# Patient Record
Sex: Male | Born: 1950 | Race: Black or African American | Hispanic: No | Marital: Married | Smoking: Former smoker
Health system: Southern US, Community
[De-identification: ages and names within clinical notes are randomized; demographics above are authoritative.]

## PROBLEM LIST (undated history)

## (undated) DIAGNOSIS — E119 Type 2 diabetes mellitus without complications: Secondary | ICD-10-CM

## (undated) DIAGNOSIS — G4730 Sleep apnea, unspecified: Secondary | ICD-10-CM

## (undated) DIAGNOSIS — E785 Hyperlipidemia, unspecified: Secondary | ICD-10-CM

## (undated) DIAGNOSIS — N471 Phimosis: Secondary | ICD-10-CM

## (undated) DIAGNOSIS — I10 Essential (primary) hypertension: Secondary | ICD-10-CM

## (undated) DIAGNOSIS — H269 Unspecified cataract: Secondary | ICD-10-CM

## (undated) DIAGNOSIS — M109 Gout, unspecified: Secondary | ICD-10-CM

## (undated) DIAGNOSIS — Z860100 Personal history of colon polyps, unspecified: Secondary | ICD-10-CM

## (undated) DIAGNOSIS — K5730 Diverticulosis of large intestine without perforation or abscess without bleeding: Secondary | ICD-10-CM

## (undated) DIAGNOSIS — N183 Chronic kidney disease, stage 3 unspecified: Secondary | ICD-10-CM

## (undated) DIAGNOSIS — K219 Gastro-esophageal reflux disease without esophagitis: Secondary | ICD-10-CM

## (undated) DIAGNOSIS — M1990 Unspecified osteoarthritis, unspecified site: Secondary | ICD-10-CM

## (undated) DIAGNOSIS — I872 Venous insufficiency (chronic) (peripheral): Secondary | ICD-10-CM

## (undated) HISTORY — DX: Essential (primary) hypertension: I10

## (undated) HISTORY — DX: Gastro-esophageal reflux disease without esophagitis: K21.9

## (undated) HISTORY — DX: Hyperlipidemia, unspecified: E78.5

## (undated) HISTORY — DX: Type 2 diabetes mellitus without complications: E11.9

## (undated) HISTORY — DX: Phimosis: N47.1

## (undated) HISTORY — DX: Unspecified cataract: H26.9

## (undated) HISTORY — DX: Diverticulosis of large intestine without perforation or abscess without bleeding: K57.30

## (undated) HISTORY — DX: Gout, unspecified: M10.9

## (undated) HISTORY — DX: Venous insufficiency (chronic) (peripheral): I87.2

## (undated) HISTORY — PX: VASECTOMY: SHX75

## (undated) HISTORY — DX: Personal history of colon polyps, unspecified: Z86.0100

## (undated) HISTORY — PX: OTHER SURGICAL HISTORY: SHX169

## (undated) HISTORY — DX: Personal history of colonic polyps: Z86.010

---

## 2000-02-25 DIAGNOSIS — E119 Type 2 diabetes mellitus without complications: Secondary | ICD-10-CM

## 2000-02-25 HISTORY — DX: Type 2 diabetes mellitus without complications: E11.9

## 2001-02-24 DIAGNOSIS — N471 Phimosis: Secondary | ICD-10-CM

## 2001-02-24 HISTORY — DX: Phimosis: N47.1

## 2004-12-13 ENCOUNTER — Ambulatory Visit: Payer: Self-pay | Admitting: Internal Medicine

## 2005-02-07 ENCOUNTER — Ambulatory Visit: Payer: Self-pay | Admitting: Internal Medicine

## 2005-06-09 ENCOUNTER — Ambulatory Visit: Payer: Self-pay | Admitting: Internal Medicine

## 2005-06-27 ENCOUNTER — Ambulatory Visit: Payer: Self-pay | Admitting: Gastroenterology

## 2005-07-09 ENCOUNTER — Ambulatory Visit: Payer: Self-pay | Admitting: Internal Medicine

## 2005-07-11 ENCOUNTER — Ambulatory Visit: Payer: Self-pay | Admitting: Gastroenterology

## 2005-07-11 ENCOUNTER — Encounter (INDEPENDENT_AMBULATORY_CARE_PROVIDER_SITE_OTHER): Payer: Self-pay | Admitting: Specialist

## 2005-07-11 LAB — HM COLONOSCOPY: HM Colonoscopy: ABNORMAL

## 2005-07-14 ENCOUNTER — Ambulatory Visit: Payer: Self-pay | Admitting: Internal Medicine

## 2005-10-13 ENCOUNTER — Ambulatory Visit: Payer: Self-pay | Admitting: Internal Medicine

## 2005-11-13 ENCOUNTER — Ambulatory Visit: Payer: Self-pay | Admitting: Internal Medicine

## 2005-11-17 ENCOUNTER — Ambulatory Visit: Payer: Self-pay | Admitting: Internal Medicine

## 2005-11-24 HISTORY — PX: HYDROCELE EXCISION / REPAIR: SUR1145

## 2005-12-04 ENCOUNTER — Other Ambulatory Visit: Payer: Self-pay

## 2005-12-10 ENCOUNTER — Ambulatory Visit: Payer: Self-pay | Admitting: Urology

## 2005-12-18 ENCOUNTER — Ambulatory Visit: Payer: Self-pay | Admitting: Internal Medicine

## 2005-12-26 ENCOUNTER — Encounter: Payer: Self-pay | Admitting: Internal Medicine

## 2006-01-29 ENCOUNTER — Ambulatory Visit: Payer: Self-pay | Admitting: Internal Medicine

## 2006-03-16 ENCOUNTER — Ambulatory Visit: Payer: Self-pay | Admitting: Family Medicine

## 2006-03-20 ENCOUNTER — Ambulatory Visit: Payer: Self-pay | Admitting: Internal Medicine

## 2006-04-15 ENCOUNTER — Encounter: Payer: Self-pay | Admitting: Internal Medicine

## 2006-04-15 ENCOUNTER — Ambulatory Visit: Payer: Self-pay | Admitting: Internal Medicine

## 2006-04-25 ENCOUNTER — Ambulatory Visit: Payer: Self-pay | Admitting: Internal Medicine

## 2006-05-26 ENCOUNTER — Ambulatory Visit: Payer: Self-pay | Admitting: Internal Medicine

## 2006-06-09 DIAGNOSIS — Z860100 Personal history of colon polyps, unspecified: Secondary | ICD-10-CM | POA: Insufficient documentation

## 2006-06-09 DIAGNOSIS — K219 Gastro-esophageal reflux disease without esophagitis: Secondary | ICD-10-CM

## 2006-06-09 DIAGNOSIS — K5730 Diverticulosis of large intestine without perforation or abscess without bleeding: Secondary | ICD-10-CM | POA: Insufficient documentation

## 2006-06-09 DIAGNOSIS — E785 Hyperlipidemia, unspecified: Secondary | ICD-10-CM | POA: Insufficient documentation

## 2006-06-09 DIAGNOSIS — I10 Essential (primary) hypertension: Secondary | ICD-10-CM | POA: Insufficient documentation

## 2006-06-18 ENCOUNTER — Ambulatory Visit: Payer: Self-pay | Admitting: Internal Medicine

## 2006-06-19 LAB — CONVERTED CEMR LAB
ALT: 27 units/L (ref 0–40)
CO2: 32 meq/L (ref 19–32)
Chloride: 105 meq/L (ref 96–112)
Cholesterol: 132 mg/dL (ref 0–200)
Creatinine, Ser: 0.9 mg/dL (ref 0.4–1.5)
GFR calc Af Amer: 112 mL/min
Glucose, Bld: 211 mg/dL — ABNORMAL HIGH (ref 70–99)
HDL: 37.9 mg/dL — ABNORMAL LOW (ref >39.0)
PSA: 1 ng/mL (ref 0.10–4.00)
Sodium: 140 meq/L (ref 135–145)
Triglycerides: 130 mg/dL (ref 0–149)
VLDL: 26 mg/dL (ref 0–40)

## 2006-07-29 ENCOUNTER — Encounter (INDEPENDENT_AMBULATORY_CARE_PROVIDER_SITE_OTHER): Payer: Self-pay | Admitting: *Deleted

## 2006-07-31 ENCOUNTER — Ambulatory Visit: Payer: Self-pay | Admitting: Internal Medicine

## 2006-09-18 ENCOUNTER — Ambulatory Visit: Payer: Self-pay | Admitting: Internal Medicine

## 2006-09-21 LAB — CONVERTED CEMR LAB: Hgb A1c MFr Bld: 11.6 % — ABNORMAL HIGH (ref 4.6–6.1)

## 2006-11-13 ENCOUNTER — Ambulatory Visit: Payer: Self-pay | Admitting: Internal Medicine

## 2006-12-24 ENCOUNTER — Ambulatory Visit: Payer: Self-pay | Admitting: Internal Medicine

## 2006-12-25 LAB — CONVERTED CEMR LAB
ALT: 26 units/L (ref 0–53)
CO2: 31 meq/L (ref 19–32)
Calcium: 9.6 mg/dL (ref 8.4–10.5)
Chloride: 105 meq/L (ref 96–112)
Cholesterol: 129 mg/dL (ref 0–200)
Creatinine, Ser: 1 mg/dL (ref 0.4–1.5)
Glucose, Bld: 89 mg/dL (ref 70–99)
HDL: 32 mg/dL — ABNORMAL LOW (ref >39.0)
Sodium: 142 meq/L (ref 135–145)
Triglycerides: 135 mg/dL (ref 0–149)

## 2007-04-12 ENCOUNTER — Encounter (INDEPENDENT_AMBULATORY_CARE_PROVIDER_SITE_OTHER): Payer: Self-pay | Admitting: *Deleted

## 2007-04-16 ENCOUNTER — Ambulatory Visit: Payer: Self-pay | Admitting: Internal Medicine

## 2007-05-14 ENCOUNTER — Ambulatory Visit: Payer: Self-pay | Admitting: Family Medicine

## 2007-06-24 ENCOUNTER — Ambulatory Visit: Payer: Self-pay | Admitting: Internal Medicine

## 2007-06-24 ENCOUNTER — Encounter (INDEPENDENT_AMBULATORY_CARE_PROVIDER_SITE_OTHER): Payer: Self-pay | Admitting: *Deleted

## 2007-06-25 ENCOUNTER — Telehealth (INDEPENDENT_AMBULATORY_CARE_PROVIDER_SITE_OTHER): Payer: Self-pay | Admitting: *Deleted

## 2007-08-03 ENCOUNTER — Ambulatory Visit: Payer: Self-pay | Admitting: Family Medicine

## 2007-08-19 ENCOUNTER — Ambulatory Visit: Payer: Self-pay | Admitting: Internal Medicine

## 2007-11-26 ENCOUNTER — Ambulatory Visit: Payer: Self-pay | Admitting: Internal Medicine

## 2007-12-02 LAB — CONVERTED CEMR LAB
ALT: 23 units/L (ref 0–53)
AST: 17 units/L (ref 0–37)
CO2: 20 meq/L (ref 19–32)
Calcium: 9.3 mg/dL (ref 8.4–10.5)
Chloride: 99 meq/L (ref 96–112)
Cholesterol: 208 mg/dL — ABNORMAL HIGH (ref 0–200)
Hgb A1c MFr Bld: 13.1 % — ABNORMAL HIGH (ref 4.6–6.1)
Sodium: 136 meq/L (ref 135–145)
Total CHOL/HDL Ratio: 5.6
Total Protein: 7.1 g/dL (ref 6.0–8.3)
VLDL: 69 mg/dL — ABNORMAL HIGH (ref 0–40)

## 2007-12-09 ENCOUNTER — Telehealth: Payer: Self-pay | Admitting: Internal Medicine

## 2008-05-01 ENCOUNTER — Ambulatory Visit: Payer: Self-pay | Admitting: Internal Medicine

## 2008-05-05 LAB — CONVERTED CEMR LAB
Albumin: 3.8 g/dL (ref 3.5–5.2)
Basophils Absolute: 0 10*3/uL (ref 0.0–0.1)
Basophils Relative: 0.6 % (ref 0.0–3.0)
CO2: 31 meq/L (ref 19–32)
Calcium: 9.3 mg/dL (ref 8.4–10.5)
Chloride: 102 meq/L (ref 96–112)
Cholesterol: 157 mg/dL (ref 0–200)
Creatinine,U: 118.6 mg/dL
Eosinophils Absolute: 0.3 10*3/uL (ref 0.0–0.7)
GFR calc non Af Amer: 81 mL/min
Glucose, Bld: 256 mg/dL — ABNORMAL HIGH (ref 70–99)
Hemoglobin: 14 g/dL (ref 13.0–17.0)
Hgb A1c MFr Bld: 12.2 % — ABNORMAL HIGH (ref 4.6–6.0)
LDL Cholesterol: 95 mg/dL (ref 0–99)
Lymphocytes Relative: 58.3 % — ABNORMAL HIGH (ref 12.0–46.0)
MCHC: 33.7 g/dL (ref 30.0–36.0)
MCV: 88.9 fL (ref 78.0–100.0)
Microalb Creat Ratio: 48.1 mg/g — ABNORMAL HIGH (ref 0.0–30.0)
Microalb, Ur: 5.7 mg/dL — ABNORMAL HIGH (ref 0.0–1.9)
Monocytes Absolute: 0.5 10*3/uL (ref 0.1–1.0)
Neutro Abs: 1.9 10*3/uL (ref 1.4–7.7)
PSA: 1.11 ng/mL (ref 0.10–4.00)
Potassium: 4 meq/L (ref 3.5–5.1)
RBC: 4.66 M/uL (ref 4.22–5.81)
RDW: 12.6 % (ref 11.5–14.6)
Sodium: 139 meq/L (ref 135–145)
Total Bilirubin: 0.8 mg/dL (ref 0.3–1.2)
Triglycerides: 127 mg/dL (ref 0–149)
VLDL: 25 mg/dL (ref 0–40)

## 2008-05-12 ENCOUNTER — Ambulatory Visit: Payer: Self-pay | Admitting: Internal Medicine

## 2008-06-15 ENCOUNTER — Ambulatory Visit: Payer: Self-pay | Admitting: Internal Medicine

## 2008-07-18 ENCOUNTER — Ambulatory Visit: Payer: Self-pay | Admitting: Family Medicine

## 2008-09-22 ENCOUNTER — Ambulatory Visit: Payer: Self-pay | Admitting: Internal Medicine

## 2008-11-14 ENCOUNTER — Ambulatory Visit: Payer: Self-pay | Admitting: Internal Medicine

## 2008-11-16 LAB — CONVERTED CEMR LAB
ALT: 27 units/L (ref 0–53)
Calcium: 9.2 mg/dL (ref 8.4–10.5)
Chloride: 104 meq/L (ref 96–112)
Eosinophils Relative: 4.1 % (ref 0.0–5.0)
HCT: 43 % (ref 39.0–52.0)
HDL: 39.5 mg/dL (ref >39.00)
Hemoglobin: 14 g/dL (ref 13.0–17.0)
Hgb A1c MFr Bld: 12.7 % — ABNORMAL HIGH (ref 4.6–6.5)
LDL Cholesterol: 75 mg/dL (ref 0–99)
Lymphs Abs: 3.7 10*3/uL (ref 0.7–4.0)
MCV: 89.3 fL (ref 78.0–100.0)
Monocytes Relative: 7.1 % (ref 3.0–12.0)
Neutro Abs: 1.8 10*3/uL (ref 1.4–7.7)
Phosphorus: 3 mg/dL (ref 2.3–4.6)
Potassium: 3.6 meq/L (ref 3.5–5.1)
RDW: 12.2 % (ref 11.5–14.6)
Sodium: 141 meq/L (ref 135–145)
Total Bilirubin: 0.7 mg/dL (ref 0.3–1.2)
VLDL: 25.6 mg/dL (ref 0.0–40.0)
WBC: 6.2 10*3/uL (ref 4.5–10.5)

## 2009-01-11 ENCOUNTER — Telehealth: Payer: Self-pay | Admitting: Internal Medicine

## 2009-05-08 ENCOUNTER — Ambulatory Visit: Payer: Self-pay | Admitting: Internal Medicine

## 2009-05-09 LAB — CONVERTED CEMR LAB
ALT: 28 units/L (ref 0–53)
AST: 22 units/L (ref 0–37)
Albumin: 3.7 g/dL (ref 3.5–5.2)
Alkaline Phosphatase: 54 units/L (ref 39–117)
CO2: 30 meq/L (ref 19–32)
Cholesterol: 143 mg/dL (ref 0–200)
Creatinine, Ser: 0.8 mg/dL (ref 0.4–1.5)
Creatinine,U: 111.6 mg/dL
Eosinophils Relative: 4 % (ref 0.0–5.0)
GFR calc non Af Amer: 126.68 mL/min (ref >60)
Glucose, Bld: 234 mg/dL — ABNORMAL HIGH (ref 70–99)
Hgb A1c MFr Bld: 13.3 % — ABNORMAL HIGH (ref 4.6–6.5)
MCV: 90 fL (ref 78.0–100.0)
Microalb Creat Ratio: 343.2 mg/g — ABNORMAL HIGH (ref 0.0–30.0)
Monocytes Absolute: 0.5 10*3/uL (ref 0.1–1.0)
Neutrophils Relative %: 34.2 % — ABNORMAL LOW (ref 43.0–77.0)
Platelets: 185 10*3/uL (ref 150.0–400.0)
TSH: 1.62 microintl units/mL (ref 0.35–5.50)
Total Protein: 7.5 g/dL (ref 6.0–8.3)
Triglycerides: 122 mg/dL (ref 0.0–149.0)
WBC: 6 10*3/uL (ref 4.5–10.5)

## 2009-12-24 ENCOUNTER — Ambulatory Visit: Payer: Self-pay | Admitting: Internal Medicine

## 2009-12-25 LAB — CONVERTED CEMR LAB
CO2: 28 meq/L (ref 19–32)
Creatinine, Ser: 0.9 mg/dL (ref 0.4–1.5)
GFR calc non Af Amer: 111.78 mL/min (ref >60)
Glucose, Bld: 218 mg/dL — ABNORMAL HIGH (ref 70–99)
Hgb A1c MFr Bld: 13.6 % — ABNORMAL HIGH (ref 4.6–6.5)
Phosphorus: 2.8 mg/dL (ref 2.3–4.6)
Sodium: 138 meq/L (ref 135–145)

## 2010-03-24 LAB — CONVERTED CEMR LAB: Hgb A1c MFr Bld: 11.9 % — ABNORMAL HIGH (ref 4.6–6.0)

## 2010-03-26 NOTE — Assessment & Plan Note (Signed)
Summary: CPX/RBH   Vital Signs:  Patient profile:   60 year old male Weight:      265 pounds Temp:     98.3 degrees F oral Pulse rate:   76 / minute Pulse rhythm:   regular BP sitting:   120 / 80  (left arm) Cuff size:   large  Vitals Entered By: Mervin Hack CMA Duncan Dull) (May 08, 2009 9:53 AM) CC: adult physical   History of Present Illness: Doing fairly well did increase premeal insulin sugars down below 140 premeal mostly trying to be careful with eating hopes to increase his walking and exercise with yard work with the warmer weather  He has no other new concerns  Allergies: No Known Drug Allergies  Past History:  Past medical, surgical, family and social histories (including risk factors) reviewed for relevance to current acute and chronic problems.  Past Medical History: Reviewed history from 06/09/2006 and no changes required. Colonic polyps, hx of----hyperplastic Diabetes mellitus, type II Diverticulosis, colon GERD Hypertension  Past Surgical History: Reviewed history from 06/09/2006 and no changes required. 2002  hospitalized for very high sugars 2003  phimosis repair 10/07  hydrocele repair Lonna Cobb)  Family History: Reviewed history from 06/09/2006 and no changes required. Dad with DM Mom died @72  of DM,HTN 4 brothers--Hx of schizophrenia, DM, HTN 3 sisters HTN in fammily DM very strong in family No prostate or colon cancer  Social History: Reviewed history from 05/14/2007 and no changes required. Married--3 daughters Former Smoker, Quit in 2000, has 37 pack year history Alcohol use-no Drug use-no--in past , no injection drugs Occupation:  Control and instrumentation engineer at [employer]  Review of Systems General:  Denies sleep disorder; weight up 7-8#---did have increased insulin but also not active wears seat belt. Eyes:  Denies double vision and vision loss-1 eye; due for eye exam with Dr Alvester Morin. ENT:  Denies decreased hearing and ringing in ears; teeth  okay---stays up with dentist. CV:  Denies chest pain or discomfort, difficulty breathing at night, difficulty breathing while lying down, fainting, lightheadness, palpitations, and shortness of breath with exertion. Resp:  Denies cough and shortness of breath. GI:  Denies abdominal pain, bloody stools, change in bowel habits, dark tarry stools, indigestion, nausea, and vomiting. GU:  Complains of nocturia; denies erectile dysfunction, urinary frequency, and urinary hesitancy; nocturia x 1 at most. MS:  Complains of joint pain; denies joint swelling; some right shoulder aching--relates to work strain. Derm:  Denies lesion(s) and rash. Neuro:  Denies headaches, numbness, tingling, and weakness. Psych:  Denies anxiety and depression. Heme:  Denies abnormal bruising and enlarge lymph nodes. Allergy:  Complains of seasonal allergies and sneezing; occ spring symptoms--wears mask and occ uses OTC med.  Physical Exam  General:  alert and normal appearance.   Eyes:  pupils equal, pupils round, pupils reactive to light, and no optic disk abnormalities.   Ears:  R ear normal and L ear normal.   Mouth:  no erythema, no exudates, and no lesions.   Neck:  supple, no masses, no thyromegaly, no carotid bruits, and no cervical lymphadenopathy.   Lungs:  normal respiratory effort and normal breath sounds.   Heart:  normal rate, regular rhythm, no murmur, and no gallop.   Abdomen:  soft, non-tender, and no masses.   Rectal:  no hemorrhoids and no masses.   Prostate:  limited exam but seemed to have no gland enlargement and no nodules.   Msk:  no joint tenderness and no joint swelling.  Pulses:  faint to 1+ bilat Extremities:  no edema Neurologic:  alert & oriented X3, strength normal in all extremities, and gait normal.   Skin:  no rashes, no suspicious lesions, and no ulcerations.   Axillary Nodes:  No palpable lymphadenopathy Psych:  normally interactive, good eye contact, not anxious appearing, and  not depressed appearing.    Diabetes Management Exam:    Foot Exam (with socks and/or shoes not present):       Sensory-Pinprick/Light touch:          Left medial foot (L-4): normal          Left dorsal foot (L-5): normal          Left lateral foot (S-1): normal          Right medial foot (L-4): normal          Right dorsal foot (L-5): normal          Right lateral foot (S-1): normal       Inspection:          Left foot: normal          Right foot: normal       Nails:          Left foot: thickened          Right foot: thickened   Impression & Recommendations:  Problem # 1:  PREVENTIVE HEALTH CARE (ICD-V70.0) Assessment Comment Only up to date on colon due for PSA discussed fitness  Problem # 2:  DIABETES MELLITUS, TYPE II, UNCONTROLLED (ICD-250.02) Assessment: Comment Only  has increased the humalog as instructed if A1c still >10%, will need to increase again and perhaps slightly on the lantus [place] might be a good option but cost is limiting factor  His updated medication list for this problem includes:    Lantus Solostar 100 Unit/ml Soln (Insulin glargine) ..... Inject 60 unit subcutaneously at bedtime    Metformin Hcl 1000 Mg Tabs (Metformin hcl) .Marland Kitchen... 1 by mouth two times a day    Lisinopril-hydrochlorothiazide 20-25 Mg Tabs (Lisinopril-hydrochlorothiazide) .Marland Kitchen... 1 by mouth daily    Aspir-low 81 Mg Tbec (Aspirin) .Marland Kitchen... 1 by mouth daily    Glipizide 10 Mg Tabs (Glipizide) .Marland Kitchen... 1 daily by mouth    Humalog Pen 100 Unit/ml Soln (Insulin lispro (human)) .Marland KitchenMarland KitchenMarland KitchenMarland Kitchen 30 units before breakfast and lunch and 40 units before supper  Orders: TLB-Microalbumin/Creat Ratio, Urine (82043-MALB) TLB-A1C / Hgb A1C (Glycohemoglobin) (83036-A1C)  Problem # 3:  HYPERLIPIDEMIA (ICD-272.4) Assessment: Unchanged  will recheck labs  His updated medication list for this problem includes:    Simvastatin 80 Mg Tabs (Simvastatin) .Marland Kitchen... Take 1 tablet by mouth once a day  Labs  Reviewed: SGOT: 24 (11/14/2008)   SGPT: 27 (11/14/2008)   HDL:39.50 (11/14/2008), 36.9 (05/01/2008)  LDL:75 (11/14/2008), 95 (05/01/2008)  Chol:140 (11/14/2008), 157 (05/01/2008)  Trig:128.0 (11/14/2008), 127 (05/01/2008)  Orders: TLB-Lipid Panel (80061-LIPID) TLB-Hepatic/Liver Function Pnl (80076-HEPATIC)  Problem # 4:  HYPERTENSION (ICD-401.9) Assessment: Unchanged  reasonable control will check urine microal  His updated medication list for this problem includes:    Lisinopril-hydrochlorothiazide 20-25 Mg Tabs (Lisinopril-hydrochlorothiazide) .Marland Kitchen... 1 by mouth daily  BP today: 120/80 Prior BP: 128/70 (11/14/2008)  Labs Reviewed: K+: 3.6 (11/14/2008) Creat: : 0.9 (11/14/2008)   Chol: 140 (11/14/2008)   HDL: 39.50 (11/14/2008)   LDL: 75 (11/14/2008)   TG: 128.0 (11/14/2008)  Orders: TLB-Renal Function Panel (80069-RENAL) TLB-CBC Platelet - w/Differential (85025-CBCD) TLB-TSH (Thyroid Stimulating Hormone) (84443-TSH) Venipuncture ([address])  Complete Medication  List: 1)  Lantus Solostar 100 Unit/ml Soln (Insulin glargine) .... Inject 60 unit subcutaneously at bedtime 2)  Simvastatin 80 Mg Tabs (Simvastatin) .... Take 1 tablet by mouth once a day 3)  Metformin Hcl 1000 Mg Tabs (Metformin hcl) .Marland Kitchen.. 1 by mouth two times a day 4)  Lisinopril-hydrochlorothiazide 20-25 Mg Tabs (Lisinopril-hydrochlorothiazide) .Marland Kitchen.. 1 by mouth daily 5)  Aspir-low 81 Mg Tbec (Aspirin) .Marland Kitchen.. 1 by mouth daily 6)  Glipizide 10 Mg Tabs (Glipizide) .Marland Kitchen.. 1 daily by mouth 7)  Humalog Pen 100 Unit/ml Soln (Insulin lispro (human)) .... 30 units before breakfast and lunch and 40 units before supper 8)  Novofine 30g X 8 Mm Misc (Insulin pen needle) .... Use as directed  Other Orders: TLB-PSA (Prostate Specific Antigen) (84153-PSA)  Patient Instructions: 1)  Please schedule a follow-up appointment in 6 months .  Prescriptions: METFORMIN HCL 1000 MG TABS (METFORMIN HCL) 1 by mouth two times a day  #180 x 3    Entered by:   Mervin Hack CMA (AAMA)   Authorized by:   Cindee Salt MD   Signed by:   Mervin Hack CMA (AAMA) on 05/08/2009   Method used:   Electronically to        [facility] [address] (retail)       [address]       [place], [place]  [address]       Ph: [phone]       Fax: [phone]   RxID:   [id number] GLIPIZIDE 10 MG TABS (GLIPIZIDE) 1 daily by mouth  #90 x 3   Entered by:   Mervin Hack CMA (AAMA)   Authorized by:   Cindee Salt MD   Signed by:   Mervin Hack CMA (AAMA) on 05/08/2009   Method used:   Electronically to        [facility] [address] (retail)       [address], [address]       [place], [place]  [address]       Ph: [phone]       Fax: [phone]   RxID:   [id number] LISINOPRIL-HYDROCHLOROTHIAZIDE 20-25 MG TABS (LISINOPRIL-HYDROCHLOROTHIAZIDE) 1 by mouth daily  #90 x 3   Entered by:   Mervin Hack CMA (AAMA)   Authorized by:   Cindee Salt MD   Signed by:   Mervin Hack CMA (AAMA) on 05/08/2009   Method used:   Electronically to        [facility] [address] (retail)       [address], [address]       [place], [place]  [address]       Ph: [phone]       Fax: [phone]   RxID:   [id number] SIMVASTATIN 80 MG TABS (SIMVASTATIN) Take 1 tablet by mouth once a day  #90 x 3   Entered by:   Mervin Hack CMA (AAMA)   Authorized by:   Cindee Salt MD   Signed by:   Mervin Hack CMA (AAMA) on 05/08/2009   Method used:   Electronically to        [facility] [address] (retail)       [address]       [place], [place]  [address]       Ph: [phone]       Fax: [phone]   RxID:   [id number]   Current Allergies (reviewed today): No known allergies

## 2010-03-26 NOTE — Assessment & Plan Note (Signed)
Summary: 6 M F/U DLO   Vital Signs:  Patient profile:   60 year old male Weight:      245 pounds BMI:     34.29 Temp:     98.6 degrees F oral Pulse rate:   64 / minute Pulse rhythm:   regular BP sitting:   140 / 80  (left arm) Cuff size:   large  Vitals Entered By: Mervin Hack CMA Duncan Dull) (December 24, 2009 11:21 AM) CC: 6 MONTH FOLLOW-UP   History of Present Illness: Doing okay did increase the insulin as directed after the last visit Has been doing more exercise Has cut back on eating weight is down 20#  No chest pain No SOB No edema  Due for eye exam with Dr Alvester Morin no visual changes  No sig stomach problems no heartburn issues  Allergies: No Known Drug Allergies  Past History:  Past medical, surgical, family and social histories (including risk factors) reviewed for relevance to current acute and chronic problems.  Past Medical History: Reviewed history from 06/09/2006 and no changes required. Colonic polyps, hx of----hyperplastic Diabetes mellitus, type II Diverticulosis, colon GERD Hypertension  Past Surgical History: Reviewed history from 06/09/2006 and no changes required. 2002  hospitalized for very high sugars 2003  phimosis repair 10/07  hydrocele repair Lonna Cobb)  Family History: Reviewed history from 06/09/2006 and no changes required. Dad with DM Mom died @72  of DM,HTN 4 brothers--Hx of schizophrenia, DM, HTN 3 sisters HTN in fammily DM very strong in family No prostate or colon cancer  Social History: Reviewed history from 05/14/2007 and no changes required. 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   Neck:  supple, no masses, no thyromegaly, no carotid bruits, and no  cervical lymphadenopathy.   Lungs:  normal respiratory effort, no intercostal retractions, no accessory muscle use, and normal breath sounds.   Heart:  normal rate, regular rhythm, no murmur, and no gallop.   Pulses:  faint in feet Extremities:  no edema Skin:  no suspicious lesions and no ulcerations.   Psych:  normally interactive, good eye contact, not anxious appearing, and not depressed appearing.    Diabetes Management Exam:    Foot Exam (with socks and/or shoes not present):       Sensory-Pinprick/Light touch:          Left medial foot (L-4): diminished          Left dorsal foot (L-5): diminished          Left lateral foot (S-1): diminished          Right medial foot (L-4): diminished          Right dorsal foot (L-5): diminished          Right lateral foot (S-1): diminished       Inspection:          Left foot: normal          Right foot: normal       Nails:          Left foot: thickened          Right foot: thickened   Impression & Recommendations:  Problem # 1:  DIABETES MELLITUS, TYPE II (ICD-250.00)  Assessment Comment Only  has lost 20# despite increasing insulin  Should have better control will check  His updated medication list for this problem includes:    Lantus Solostar 100 Unit/ml Soln (Insulin glargine) ..... Inject 60 unit subcutaneously at bedtime    Metformin Hcl 1000 Mg Tabs (Metformin hcl) .Marland Kitchen... 1 by mouth two times a day    Lisinopril-hydrochlorothiazide 20-25 Mg Tabs (Lisinopril-hydrochlorothiazide) .Marland Kitchen... 1 by mouth daily    Glipizide 10 Mg Tabs (Glipizide) .Marland Kitchen... 1 daily by mouth    Humalog Pen 100 Unit/ml Soln (Insulin lispro (human)) .Marland KitchenMarland KitchenMarland KitchenMarland Kitchen 35 units before breakfast and lunch and 45 units before supper    Aspir-low 81 Mg Tbec (Aspirin) .Marland Kitchen... 1 by mouth daily  Labs Reviewed: Creat: 0.8 (05/08/2009)     Last Eye Exam: no retinopathy (05/26/2007) Reviewed HgBA1c results: 13.3 (05/08/2009)  12.7 (11/14/2008)  Orders: Venipuncture  (40102) TLB-Renal Function Panel (80069-RENAL) TLB-A1C / Hgb A1C (Glycohemoglobin) (83036-A1C)  Problem # 2:  HYPERTENSION (ICD-401.9) Assessment: Unchanged reasonable control no changes  His updated medication list for this problem includes:    Lisinopril-hydrochlorothiazide 20-25 Mg Tabs (Lisinopril-hydrochlorothiazide) .Marland Kitchen... 1 by mouth daily  BP today: 140/80 Prior BP: 120/80 (05/08/2009)  Labs Reviewed: K+: 3.9 (05/08/2009) Creat: : 0.8 (05/08/2009)   Chol: 143 (05/08/2009)   HDL: 48.10 (05/08/2009)   LDL: 71 (05/08/2009)   TG: 122.0 (05/08/2009)  Problem # 3:  HYPERLIPIDEMIA (ICD-272.4) Assessment: Unchanged no problems with meds recheck at PE  His updated medication list for this problem includes:    Simvastatin 80 Mg Tabs (Simvastatin) .Marland Kitchen... Take 1 tablet by mouth once a day  Labs Reviewed: SGOT: 22 (05/08/2009)   SGPT: 28 (05/08/2009)   HDL:48.10 (05/08/2009), 39.50 (11/14/2008)  LDL:71 (05/08/2009), 75 (11/14/2008)  Chol:143 (05/08/2009), 140 (11/14/2008)  Trig:122.0 (05/08/2009), 128.0 (11/14/2008)  Problem # 4:  GERD (ICD-530.81) Assessment: Unchanged doing fine  Complete Medication List: 1)  Lantus Solostar 100 Unit/ml Soln (Insulin glargine) .... Inject 60 unit subcutaneously at bedtime 2)  Simvastatin 80 Mg Tabs (Simvastatin) .... Take 1 tablet by mouth once a day 3)  Metformin Hcl 1000 Mg Tabs (Metformin hcl) .Marland Kitchen.. 1 by mouth two times a day 4)  Lisinopril-hydrochlorothiazide 20-25 Mg Tabs (Lisinopril-hydrochlorothiazide) .Marland Kitchen.. 1 by mouth daily 5)  Glipizide 10 Mg Tabs (Glipizide) .Marland Kitchen.. 1 daily by mouth 6)  Humalog Pen 100 Unit/ml Soln (Insulin lispro (human)) .... 35 units before breakfast and lunch and 45 units before supper 7)  Novofine 30g X 8 Mm Misc (Insulin pen needle) .... Use as directed 8)  Aspir-low 81 Mg Tbec (Aspirin) .Marland Kitchen.. 1 by mouth daily  Other Orders: Admin 1st Vaccine (72536) Flu Vaccine 37yrs + [id number])  Patient Instructions: 1)  Please  schedule a follow-up appointment in 6 months for physical   Orders Added: 1)  Admin 1st Vaccine [90471] 2)  Flu Vaccine 62yrs + [90658] 3)  Est. Patient Level IV [47425] 4)  Venipuncture [36415] 5)  TLB-Renal Function Panel [80069-RENAL] 6)  TLB-A1C / Hgb A1C (Glycohemoglobin) [83036-A1C] Flu Vaccine Consent Questions     Do you have a history of severe allergic reactions to this vaccine? no    Any prior history of allergic reactions to egg and/or gelatin? no    Do you have a sensitivity to the preservative Thimersol? no    Do you have a past history of Guillan-Barre Syndrome? no    Do you currently have an acute febrile illness? no    Have you ever had a severe reaction to  latex? no    Vaccine information given and explained to patient? yes    Are you currently pregnant? no    Lot Number:[id number]   Exp Date:08/24/2010   Site Given  Left Deltoid IM   Current Allergies (reviewed today): No known allergies    .[email or web address]

## 2010-04-23 ENCOUNTER — Encounter (INDEPENDENT_AMBULATORY_CARE_PROVIDER_SITE_OTHER): Payer: Self-pay | Admitting: *Deleted

## 2010-04-23 ENCOUNTER — Encounter: Payer: Self-pay | Admitting: Internal Medicine

## 2010-04-23 ENCOUNTER — Ambulatory Visit (INDEPENDENT_AMBULATORY_CARE_PROVIDER_SITE_OTHER): Payer: BC Managed Care – PPO | Admitting: Internal Medicine

## 2010-04-23 DIAGNOSIS — J111 Influenza due to unidentified influenza virus with other respiratory manifestations: Secondary | ICD-10-CM

## 2010-04-23 DIAGNOSIS — J209 Acute bronchitis, unspecified: Secondary | ICD-10-CM

## 2010-04-30 ENCOUNTER — Encounter: Payer: Self-pay | Admitting: Internal Medicine

## 2010-05-02 NOTE — Letter (Signed)
Summary: Out of Work  [facility]  [address] [place], [place] [address]   Phone: [phone]  Fax: [phone]    April 23, 2010   Employee:  Adam [name]    To Whom It May Concern:   For Medical reasons, please excuse the above named employee from work for the following dates:  Start:   April 18 2010                    End:     May return to work on March 5th 2012  If you need additional information, please feel free to contact our office.         Sincerely,    Tillman Abide MD

## 2010-05-02 NOTE — Assessment & Plan Note (Signed)
Summary: DIARRHEA AND WEAK   Vital Signs:  Patient profile:   60 year old male Weight:      252 pounds Temp:     98.7 degrees F oral Pulse rate:   86 / minute Pulse rhythm:   regular Resp:     20 per minute BP sitting:   109 / 76  (left arm) Cuff size:   large  Vitals Entered By: Mervin Hack CMA Duncan Dull) (April 23, 2010 12:51 PM) CC: diarrhea, body aches, chills   History of Present Illness: Having body aching Soreness across chest had fever that has broken last night Headache and congestion in head  Started a week ago and worsened Missed work 5 days ago still sore so called for appt today  Cough is tight and with little mucus No SOB  No nausea but has had loose stools Diarrhea started yesterday with 4 stools yesterday and 2 already today no blood in stool  Using various OTC cold meds   Allergies: No Known Drug Allergies  Past History:  Past medical, surgical, family and social histories (including risk factors) reviewed for relevance to current acute and chronic problems.  Past Medical History: Reviewed history from 06/09/2006 and no changes required. Colonic polyps, hx of----hyperplastic Diabetes mellitus, type II Diverticulosis, colon GERD Hypertension  Past Surgical History: Reviewed history from 06/09/2006 and no changes required. 2002  hospitalized for very high sugars 2003  phimosis repair 10/07  hydrocele repair Lonna Cobb)  Family History: Reviewed history from 06/09/2006 and no changes required. Dad with DM Mom died @72  of DM,HTN 4 brothers--Hx of schizophrenia, DM, HTN 3 sisters HTN in fammily DM very strong in family No prostate or colon cancer  Social History: Reviewed history from 05/14/2007 and no changes required. 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  NAD but appears mildly uncomfortable Ears:  R ear normal and L ear normal.   Nose:  moderate nasal congestion Mouth:  slight injection with out exudate Neck:  supple, no masses, and no cervical lymphadenopathy.   Lungs:  normal respiratory effort, no intercostal retractions, no accessory muscle use, no dullness, and no crackles.  Slight exp phase prolongation and wheezing Heart:  normal rate, regular rhythm, no murmur, and no gallop.   Abdomen:  soft, non-tender, and normal bowel sounds.   Extremities:  no sig edema   Impression & Recommendations:  Problem # 1:  INFLUENZA WITH OTHER RESPIRATORY MANIFESTATIONS (ICD-487.1) Assessment New has classic resp syndrome with systemic symtoms of headache and body ache etc discussed course and supportive Rx  Problem # 2:  BRONCHITIS- ACUTE (ICD-466.0) Assessment: New  seems to be secondary infection  associated with mild bronchospasm will treat with azithromycin and prednisone for 5 days  His updated medication list for this problem includes:    Azithromycin 250 Mg Tabs (Azithromycin) .Marland Kitchen... 2 tabs today and then 1 tab daily for the next 4 days for respiratory infection  Complete Medication List: 1)  Lantus Solostar 100 Unit/ml Soln (Insulin glargine) .... Inject 60 unit subcutaneously at bedtime 2)  Simvastatin 80 Mg Tabs (Simvastatin) .... Take 1 tablet by mouth once a day 3)  Metformin Hcl 1000 Mg Tabs (Metformin hcl) .Marland Kitchen.. 1 by mouth two times a day  4)  Lisinopril-hydrochlorothiazide 20-25 Mg Tabs (Lisinopril-hydrochlorothiazide) .Marland Kitchen.. 1 by mouth daily 5)  Glipizide 10 Mg Tabs (Glipizide) .Marland Kitchen.. 1 daily by mouth 6)  Humalog Pen 100 Unit/ml Soln (Insulin lispro (human)) .... 35 units before breakfast and lunch and 45 units before supper 7)  Novofine 30g X 8 Mm Misc (Insulin pen needle) .... Use as directed 8)  Aspir-low 81 Mg Tbec (Aspirin) .Marland Kitchen.. 1 by mouth daily 9)  Prednisone 20 Mg  Tabs (Prednisone) .... 2 tabs daily for 5 days for wheezing in chest 10)  Azithromycin 250 Mg Tabs (Azithromycin) .... 2 tabs today and then 1 tab daily for the next 4 days for respiratory infection  Patient Instructions: 1)  Please call if you are not feeling better by the end of the week 2)  Please keep your April appt Prescriptions: AZITHROMYCIN 250 MG TABS (AZITHROMYCIN) 2 tabs today and then 1 tab daily for the next 4 days for respiratory infection  #6 x 0   Entered and Authorized by:   Cindee Salt MD   Signed by:   Cindee Salt MD on 04/23/2010   Method used:   Electronically to        [facility] [address] ([facility]ail)       [address], [address]       [place], [place]  [address]       Ph: [phone]       Fax: [phone]   RxID:   [id number] PREDNISONE 20 MG TABS (PREDNISONE) 2 tabs daily for 5 days for wheezing in chest  #10 x 0   Entered and Authorized by:   Cindee Salt MD   Signed by:   Cindee Salt MD on 04/23/2010   Method used:   Electronically to        [facility] [address] ([facility]ail)       [address], [address]       [place], [place]  [address]       Ph: [phone]       Fax: [phone]   RxID:   [id number]    Orders Added: 1)  Est. Patient Level IV [62376]    Current Allergies (reviewed today): No known allergies

## 2010-05-02 NOTE — Letter (Signed)
Summary: Out of Work  [facility]  [address] [place], [place] [address]   Phone: [phone]  Fax: [phone]    April 23, 2010   Employee:  HYATT CAPOBIANCO    To Whom It May Concern:   For Medical reasons, please excuse the above named employee from work for the following dates:  Start:  April 23, 2010 1:29 PM   End:   May Return to work on March 5th 2012  If you need additional information, please feel free to contact our office.         Sincerely,  Tillman Abide MD

## 2010-06-17 ENCOUNTER — Ambulatory Visit (INDEPENDENT_AMBULATORY_CARE_PROVIDER_SITE_OTHER): Payer: BC Managed Care – PPO | Admitting: Internal Medicine

## 2010-06-17 ENCOUNTER — Encounter: Payer: Self-pay | Admitting: Internal Medicine

## 2010-06-17 VITALS — BP 138/88 | HR 90 | Temp 98.6°F | Ht 71.0 in | Wt 253.0 lb

## 2010-06-17 DIAGNOSIS — E785 Hyperlipidemia, unspecified: Secondary | ICD-10-CM

## 2010-06-17 DIAGNOSIS — I10 Essential (primary) hypertension: Secondary | ICD-10-CM

## 2010-06-17 DIAGNOSIS — K219 Gastro-esophageal reflux disease without esophagitis: Secondary | ICD-10-CM

## 2010-06-17 DIAGNOSIS — Z0000 Encounter for general adult medical examination without abnormal findings: Secondary | ICD-10-CM

## 2010-06-17 DIAGNOSIS — IMO0001 Reserved for inherently not codable concepts without codable children: Secondary | ICD-10-CM

## 2010-06-17 DIAGNOSIS — Z125 Encounter for screening for malignant neoplasm of prostate: Secondary | ICD-10-CM

## 2010-06-17 LAB — CBC WITH DIFFERENTIAL/PLATELET
Basophils Absolute: 0.1 10*3/uL (ref 0.0–0.1)
Basophils Relative: 0.9 % (ref 0.0–3.0)
Eosinophils Absolute: 0.2 10*3/uL (ref 0.0–0.7)
Lymphocytes Relative: 57.2 % — ABNORMAL HIGH (ref 12.0–46.0)
MCHC: 33.1 g/dL (ref 30.0–36.0)
Monocytes Absolute: 0.4 10*3/uL (ref 0.1–1.0)
Neutrophils Relative %: 32.1 % — ABNORMAL LOW (ref 43.0–77.0)
Platelets: 205 10*3/uL (ref 150.0–400.0)
RBC: 5 Mil/uL (ref 4.22–5.81)
RDW: 13.4 % (ref 11.5–14.6)

## 2010-06-17 LAB — LDL CHOLESTEROL, DIRECT: Direct LDL: 143.6 mg/dL

## 2010-06-17 LAB — LIPID PANEL
Cholesterol: 238 mg/dL — ABNORMAL HIGH (ref 0–200)
HDL: 45.9 mg/dL (ref >39.00)
Total CHOL/HDL Ratio: 5
VLDL: 78.2 mg/dL — ABNORMAL HIGH (ref 0.0–40.0)

## 2010-06-17 LAB — BASIC METABOLIC PANEL
GFR: 98.69 mL/min (ref >60.00)
Potassium: 3.8 mEq/L (ref 3.5–5.1)
Sodium: 136 mEq/L (ref 135–145)

## 2010-06-17 LAB — HEPATIC FUNCTION PANEL
ALT: 24 U/L (ref 0–53)
AST: 21 U/L (ref 0–37)
Bilirubin, Direct: 0 mg/dL (ref 0.0–0.3)
Total Protein: 7.1 g/dL (ref 6.0–8.3)

## 2010-06-17 LAB — MICROALBUMIN / CREATININE URINE RATIO: Creatinine,U: 83.5 mg/dL

## 2010-06-17 NOTE — Progress Notes (Signed)
Subjective:    Patient ID: Adam [name], male    DOB: [birth year], 60 y.o.   MRN: [id number]  HPI Doing okay Has noted some swelling in his hands and feet Cut back on his humalog----he feels tis was causing the swelling Did get better with the lower dose Can't afford expensive meds  Not walking as much lately Does eat french fries occ  Some cramping lately occ awaken him at night  Current outpatient prescriptions:aspirin 81 MG tablet, Take 81 mg by mouth daily.  , Disp: , Rfl: ;  glipiZIDE (GLUCOTROL) 10 MG tablet, Take 10 mg by mouth daily.  , Disp: , Rfl: ;  insulin glargine (LANTUS SOLOSTAR) 100 UNIT/ML injection, Inject 60 units subcutaneously twice daily, Disp: , Rfl: ;  Insulin Pen Needle (NOVOFINE) 30G X 8 MM MISC, Inject into the skin. Use as directed , Disp: , Rfl:  lisinopril-hydrochlorothiazide (PRINZIDE,ZESTORETIC) 20-25 MG per tablet, Take 1 tablet by mouth daily.  , Disp: , Rfl: ;  metFORMIN (GLUCOPHAGE) 1000 MG tablet, Take 1,000 mg by mouth 2 (two) times daily.  , Disp: , Rfl: ;  simvastatin (ZOCOR) 80 MG tablet, Take 80 mg by mouth daily.  , Disp: , Rfl: ;  DISCONTD: insulin lispro (HUMALOG PEN) 100 UNIT/ML injection, Inject 35 units before breakfast and lunch and 45 units before supper , Disp: , Rfl:  DISCONTD: predniSONE (DELTASONE) 20 MG tablet, Take 2 tablets daily for 5 days for wheezing in chest , Disp: , Rfl:   Past Medical History  Diagnosis Date  . History of colonic polyps     Hyperplastic  . Diabetes mellitus type II 2002    Hospitalized for very high sugars  . Diverticulosis of colon   . GERD (gastroesophageal reflux disease)   . Hypertension   . Phimosis 2003    Repair    Past Surgical History  Procedure Date  . Hydrocele excision / repair 10/07    Lonna Cobb)    Family History  Problem Relation Age of Onset  . Diabetes Mother   . Hypertension Mother   . Diabetes Father   . Mental illness Brother     Hx of schizophrenia  . Diabetes Brother    . Hypertension Brother     History   Social History  . Marital Status: Married    Spouse Name: N/A    Number of Children: 3  . Years of Education: N/A   Occupational History  . Mail Service at [employer]    Social History Main Topics  . Smoking status: Former Smoker -- 37 years    Types: Cigarettes    Quit date: 02/24/1998  . Smokeless tobacco: Not on file  . Alcohol Use: No  . Drug Use: No  . Sexually Active: Not on file   Other Topics Concern  . Not on file   Social History Narrative  . No narrative on file   Review of Systems  Constitutional: Negative for fatigue and unexpected weight change.       Wears seat belt  HENT: Negative for hearing loss, congestion, rhinorrhea, postnasal drip and tinnitus.        Zyrtec really helping allergies Regular with dentist--due for cleaning  Eyes: Negative for visual disturbance.       No diplopia or vision loss UTD with eye exams  Respiratory: Negative for cough, chest tightness and shortness of breath.   Cardiovascular: Positive for leg swelling. Negative for chest pain and palpitations.  Edema better in hands and feet with less humalog  Gastrointestinal: Negative for nausea, vomiting, abdominal pain and blood in stool.  Genitourinary: Negative for dysuria, urgency, decreased urine volume and difficulty urinating.       Some ED but no sex  Musculoskeletal: Positive for myalgias. Negative for back pain, joint swelling, arthralgias and gait problem.       Occ muscle cramps  Skin: Negative for rash.       No suspicious areas  Neurological: Negative for dizziness, syncope, weakness, light-headedness, numbness and headaches.       Occ tingling in feet---temporary  Hematological: Negative for adenopathy. Does not bruise/bleed easily.  Psychiatric/Behavioral: Negative for sleep disturbance and dysphoric mood. The patient is not nervous/anxious.        Objective:   Physical Exam  Constitutional: He is oriented to person,  place, and time. He appears well-developed and well-nourished. No distress.  HENT:  Head: Normocephalic and atraumatic.  Right Ear: External ear normal.  Left Ear: External ear normal.  Mouth/Throat: Oropharynx is clear and moist.       TMs normal  Eyes: Conjunctivae and EOM are normal. Pupils are equal, round, and reactive to light.       Fundi benign  Neck: Normal range of motion. Neck supple. No thyromegaly present.  Cardiovascular: Normal rate, regular rhythm, normal heart sounds and intact distal pulses.  Exam reveals no gallop.   No murmur heard. Pulmonary/Chest: Effort normal and breath sounds normal. He has no wheezes. He has no rales.  Abdominal: Soft. He exhibits no mass. There is no tenderness.  Musculoskeletal: Normal range of motion. He exhibits no edema and no tenderness.  Lymphadenopathy:    He has no cervical adenopathy.  Neurological: He is alert and oriented to person, place, and time. He exhibits normal muscle tone.       Sensation decreased on plantar feet No focal weakness  Skin: Skin is warm. No rash noted. No erythema.       No ulcers  Psychiatric: He has a normal mood and affect. His behavior is normal. Judgment and thought content normal.          Assessment & Plan:

## 2010-06-20 ENCOUNTER — Telehealth: Payer: Self-pay | Admitting: *Deleted

## 2010-06-20 ENCOUNTER — Encounter: Payer: Self-pay | Admitting: *Deleted

## 2010-06-20 NOTE — Telephone Encounter (Signed)
No answer at his home number and no answering machine, called work number and left message to have pt call me, pt was out on a mail route.

## 2010-06-20 NOTE — Telephone Encounter (Signed)
Pt called back and I advised results

## 2010-06-20 NOTE — Telephone Encounter (Signed)
Message copied by Mervin Hack on Thu Jun 20, 2010 10:52 AM ------      Message from: Tillman Abide      Created: Tue Jun 18, 2010  8:02 AM       Please call      The diabetes control is worse--with HgbA1c up to 15.2%. He MUST eat better, do daily walking and lose some weight. Please have him increase the glipizide to 10mg  bid (before breakfast and supper)  -1 yr Rx for increase please      Urine test shows some early signs of diabetes affecting the kidneys---he really needs to work on living a more healthy lifestyle      Chol level is also higher with total up to 238 and LDL or bad chol of 143. It seems that maybe he is missing doses of his meds      Blood count, liver, thyroid and prostate tests are normal

## 2010-07-04 ENCOUNTER — Other Ambulatory Visit: Payer: Self-pay | Admitting: *Deleted

## 2010-07-04 MED ORDER — INSULIN PEN NEEDLE 30G X 8 MM MISC: 1.0000 | Status: DC | PRN

## 2010-07-27 ENCOUNTER — Other Ambulatory Visit: Payer: Self-pay | Admitting: Internal Medicine

## 2010-08-02 ENCOUNTER — Encounter: Payer: Self-pay | Admitting: Gastroenterology

## 2010-08-30 ENCOUNTER — Other Ambulatory Visit: Payer: Self-pay | Admitting: Internal Medicine

## 2010-09-16 ENCOUNTER — Other Ambulatory Visit: Payer: Self-pay | Admitting: Internal Medicine

## 2010-12-02 ENCOUNTER — Other Ambulatory Visit: Payer: Self-pay | Admitting: Internal Medicine

## 2010-12-17 ENCOUNTER — Encounter: Payer: Self-pay | Admitting: Internal Medicine

## 2010-12-17 ENCOUNTER — Ambulatory Visit (INDEPENDENT_AMBULATORY_CARE_PROVIDER_SITE_OTHER): Payer: BC Managed Care – PPO | Admitting: Internal Medicine

## 2010-12-17 VITALS — BP 147/80 | HR 64 | Temp 98.7°F | Ht 71.0 in | Wt 264.0 lb

## 2010-12-17 DIAGNOSIS — Z23 Encounter for immunization: Secondary | ICD-10-CM

## 2010-12-17 DIAGNOSIS — E785 Hyperlipidemia, unspecified: Secondary | ICD-10-CM

## 2010-12-17 DIAGNOSIS — I10 Essential (primary) hypertension: Secondary | ICD-10-CM

## 2010-12-17 DIAGNOSIS — K219 Gastro-esophageal reflux disease without esophagitis: Secondary | ICD-10-CM

## 2010-12-17 MED ORDER — METFORMIN HCL 1000 MG PO TABS: 1000.0000 mg | ORAL_TABLET | Freq: Two times a day (BID) | ORAL | Status: DC

## 2010-12-17 MED ORDER — INSULIN ASPART PROT & ASPART (70-30 MIX) 100 UNIT/ML ~~LOC~~ SUSP: 100.0000 [IU] | Freq: Two times a day (BID) | SUBCUTANEOUS | Status: DC

## 2010-12-17 NOTE — Progress Notes (Signed)
Subjective:    Patient ID: Adam [name], male    DOB: 29-Feb-1950, 60 y.o.   MRN: [id number]  HPI Doing okay Having some left foot swelling--started 3 months ago Intermittent --usually if prolonged sitting (stands a lot but moves around then)  Sugars running "pretty good" Last 145 fasting Checks bid Had been taking 45units of novolog mostly bid (missed lunch and often didn't eat lunch) Ran out of novolog 2 months ago for about 6 weeks Has had formal diabetic counselling--only follows some of the time  No chest pain No SOB No headaches  No recent stomach trouble  Current Outpatient Prescriptions on File Prior to Visit  Medication Sig Dispense Refill  . aspirin 81 MG tablet Take 81 mg by mouth daily.        Marland Kitchen glipiZIDE (GLUCOTROL) 10 MG tablet TAKE ONE TABLET BY MOUTH EVERY DAY  90 tablet  3  . insulin glargine (LANTUS SOLOSTAR) 100 UNIT/ML injection Inject 60 units subcutaneously twice daily      . Insulin Pen Needle (NOVOFINE) 30G X 8 MM MISC Inject 10 each into the skin as needed. Use as directed  200 each  3  . lisinopril-hydrochlorothiazide (PRINZIDE,ZESTORETIC) 20-25 MG per tablet TAKE ONE TABLET BY MOUTH EVERY DAY  90 tablet  3  . metFORMIN (GLUCOPHAGE) 1000 MG tablet TAKE ONE TABLET BY MOUTH TWICE DAILY  180 tablet  3  . simvastatin (ZOCOR) 80 MG tablet TAKE ONE TABLET BY MOUTH EVERY DAY  90 tablet  3    No Known Allergies  Past Medical History  Diagnosis Date  . History of colonic polyps     Hyperplastic  . Diabetes mellitus type II 2002    Hospitalized for very high sugars  . Diverticulosis of colon   . GERD (gastroesophageal reflux disease)   . Hypertension   . Phimosis 2003    Repair    Past Surgical History  Procedure Date  . Hydrocele excision / repair 10/07    Lonna Cobb)    Family History  Problem Relation Age of Onset  . Diabetes Mother   . Hypertension Mother   . Diabetes Father   . Mental illness Brother     Hx of schizophrenia  .  Diabetes Brother   . Hypertension Brother     History   Social History  . Marital Status: Married    Spouse Name: N/A    Number of Children: 3  . 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 Smoking status: Former Smoker -- 37 years    Types: Cigarettes    Quit date: 02/24/1998  . Smokeless tobacco: Never Used  . Alcohol Use: No  . Drug Use: No  . Sexually Active: Not on file   Other Topics Concern  . Not on file   Social History Narrative  . No narrative on file   Review of Systems Weight up about 10#---?more consistent with insulin and increased glipizide Sleeps fine     Objective:   Physical Exam  Constitutional: He appears well-developed and well-nourished. No distress.  Neck: Normal range of motion. Neck supple. No thyromegaly present.  Cardiovascular: Normal rate, regular rhythm, normal heart sounds and intact distal pulses.  Exam reveals no gallop.   No murmur heard. Pulmonary/Chest: Effort normal and breath sounds normal. No respiratory distress. He has no wheezes. He has no rales.  Musculoskeletal: Normal range of motion. He exhibits  no edema and no tenderness.  Lymphadenopathy:    He has no cervical adenopathy.  Psychiatric: He has a normal mood and affect. His behavior is normal. Judgment and thought content normal.          Assessment & Plan:

## 2010-12-17 NOTE — Patient Instructions (Signed)
Please set up blood work in about 3 months (HgbA1c---250.02)

## 2010-12-17 NOTE — Assessment & Plan Note (Signed)
No problems with meds Lab Results  Component Value Date   LDLCALC 71 05/08/2009   Good control Due for labs

## 2010-12-17 NOTE — Assessment & Plan Note (Signed)
Has been quiet 

## 2010-12-17 NOTE — Assessment & Plan Note (Addendum)
Still poor control Has not had the money to take his insulin regularly--though better Had counselling May benefit from case management if available Will check A1c but no change in insulin since off novolog for about 1 month during the recent period  His insurance will cover novolog 70/30 Since he just injects twice a day---will switch to this 100 units bid

## 2010-12-17 NOTE — Assessment & Plan Note (Signed)
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

## 2010-12-19 LAB — LIPID PANEL
Cholesterol: 171 mg/dL (ref 0–200)
HDL: 47.4 mg/dL (ref >39.00)
LDL Cholesterol: 96 mg/dL (ref 0–99)
Total CHOL/HDL Ratio: 4
Triglycerides: 137 mg/dL (ref 0.0–149.0)

## 2010-12-27 ENCOUNTER — Encounter: Payer: Self-pay | Admitting: *Deleted

## 2011-01-27 ENCOUNTER — Ambulatory Visit (INDEPENDENT_AMBULATORY_CARE_PROVIDER_SITE_OTHER): Payer: BC Managed Care – PPO | Admitting: Internal Medicine

## 2011-01-27 ENCOUNTER — Encounter: Payer: Self-pay | Admitting: Internal Medicine

## 2011-01-27 ENCOUNTER — Encounter: Payer: Self-pay | Admitting: *Deleted

## 2011-01-27 VITALS — BP 139/83 | HR 86 | Temp 98.7°F | Ht 71.0 in | Wt 273.0 lb

## 2011-01-27 DIAGNOSIS — L03116 Cellulitis of left lower limb: Secondary | ICD-10-CM | POA: Insufficient documentation

## 2011-01-27 DIAGNOSIS — L02419 Cutaneous abscess of limb, unspecified: Secondary | ICD-10-CM

## 2011-01-27 MED ORDER — CLINDAMYCIN HCL 300 MG PO CAPS: 300.0000 mg | ORAL_CAPSULE | Freq: Three times a day (TID) | ORAL | Status: AC

## 2011-01-27 NOTE — Assessment & Plan Note (Signed)
Clearly seems to have bacterial infection Will try clindamycin to cover MRSA as well as typical bacteria

## 2011-01-27 NOTE — Progress Notes (Signed)
  Subjective:    Patient ID: Adam [name], male    DOB: 09-04-1950, 60 y.o.   MRN: [id number]  HPI Doing well on the 70/30 insulin  3 days ago on way to work---was walking up a small hill Grabbed on to pole getting ready to step over chain fence [employer] and chain ripped up his leg some Some bleeding  Cleaned it up pretty quickly Using OTC antibiotic sauve on it  Has had increasing redness Now with sig pain---had to stay out of work today Using advil for the pain  Current Outpatient Prescriptions on File Prior to Visit  Medication Sig Dispense Refill  . aspirin 81 MG tablet Take 81 mg by mouth daily.        Marland Kitchen glipiZIDE (GLUCOTROL) 10 MG tablet TAKE ONE TABLET BY MOUTH EVERY DAY  90 tablet  3  . insulin aspart protamine-insulin aspart (NOVOLOG MIX 70/30) (70-30) 100 UNIT/ML injection Inject 100 Units into the skin 2 (two) times daily with a meal.  200 mL  3  . Insulin Pen Needle (NOVOFINE) 30G X 8 MM MISC Inject 10 each into the skin as needed. Use as directed  200 each  3  . lisinopril-hydrochlorothiazide (PRINZIDE,ZESTORETIC) 20-25 MG per tablet TAKE ONE TABLET BY MOUTH EVERY DAY  90 tablet  3  . metFORMIN (GLUCOPHAGE) 1000 MG tablet Take 1 tablet (1,000 mg total) by mouth 2 (two) times daily with a meal.  180 tablet  3  . simvastatin (ZOCOR) 80 MG tablet TAKE ONE TABLET BY MOUTH EVERY DAY  90 tablet  3    No Known Allergies  Past Medical History  Diagnosis Date  . History of colonic polyps     Hyperplastic  . Diabetes mellitus type II 2002    Hospitalized for very high sugars  . Diverticulosis of colon   . GERD (gastroesophageal reflux disease)   . Hypertension   . Phimosis 2003    Repair    Past Surgical History  Procedure Date  . Hydrocele excision / repair 10/07    Lonna Cobb)    Family History  Problem Relation Age of Onset  . Diabetes Mother   . Hypertension Mother   . Diabetes Father   . Mental illness Brother     Hx of schizophrenia  . Diabetes Brother     . Hypertension Brother     History   Social History  . Marital Status: Married    Spouse Name: N/A    Number of Children: 3  . 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 Smoking status: Former Smoker -- 37 years    Types: Cigarettes    Quit date: 02/24/1998  . Smokeless tobacco: Never Used  . Alcohol Use: No  . Drug Use: No  . Sexually Active: Not on file   Other Topics Concern  . Not on file   Social History Narrative  . No narrative on file   Review of Systems No clear fever---on the advil for pain No vomiting or nausea Eating okay    Objective:   Physical Exam  Constitutional: He appears well-developed and well-nourished. No distress.  Skin:       Multiple small ulcers on anterior left calf (right over tibia) Redness, warmth and sig tenderness around these          Assessment & Plan:

## 2011-01-27 NOTE — Patient Instructions (Signed)
Please call by Wednesday if your leg is not better

## 2011-02-19 ENCOUNTER — Telehealth: Payer: Self-pay | Admitting: Internal Medicine

## 2011-02-19 NOTE — Telephone Encounter (Signed)
Patient stated his legs are swelling and he is diabetic.  I made him an appointment for tomorrow at 8.

## 2011-02-19 NOTE — Telephone Encounter (Signed)
Actually the appt is for Friday Please check to see if he is having SOB I will be adding on at 1:30 and 1:45 so we can move him up to tomorrow if needed

## 2011-02-19 NOTE — Telephone Encounter (Signed)
appt rescheduled for 1:45 02/20/11

## 2011-02-20 ENCOUNTER — Encounter: Payer: Self-pay | Admitting: *Deleted

## 2011-02-20 ENCOUNTER — Ambulatory Visit (INDEPENDENT_AMBULATORY_CARE_PROVIDER_SITE_OTHER): Payer: BC Managed Care – PPO | Admitting: Internal Medicine

## 2011-02-20 ENCOUNTER — Encounter: Payer: Self-pay | Admitting: Internal Medicine

## 2011-02-20 DIAGNOSIS — M7989 Other specified soft tissue disorders: Secondary | ICD-10-CM

## 2011-02-20 DIAGNOSIS — I872 Venous insufficiency (chronic) (peripheral): Secondary | ICD-10-CM | POA: Insufficient documentation

## 2011-02-20 NOTE — Assessment & Plan Note (Signed)
Seems to have neuropathy in feet now Control is better with A1c down from 15-10 Will hold off on meds for this

## 2011-02-20 NOTE — Progress Notes (Signed)
Subjective:    Patient ID: Adam [name], male    DOB: 1950-12-10, 60 y.o.   MRN: [id number]  HPI Started noting increased leg swelling over 3-4 days Got worse then slightly better today Tender to touch Brief sharp pains yesterday down the left calf  Has kept elevated in past 2 days Did spend extended time standing on Christmas eve  No chest pain No SOB  Has had some slight open areas on left calf--one on right Some scabs also  Current Outpatient Prescriptions on File Prior to Visit  Medication Sig Dispense Refill  . aspirin 81 MG tablet Take 81 mg by mouth daily.        Marland Kitchen glipiZIDE (GLUCOTROL) 10 MG tablet TAKE ONE TABLET BY MOUTH EVERY DAY  90 tablet  3  . insulin aspart protamine-insulin aspart (NOVOLOG MIX 70/30) (70-30) 100 UNIT/ML injection Inject 100 Units into the skin 2 (two) times daily with a meal.  200 mL  3  . Insulin Pen Needle (NOVOFINE) 30G X 8 MM MISC Inject 10 each into the skin as needed. Use as directed  200 each  3  . lisinopril-hydrochlorothiazide (PRINZIDE,ZESTORETIC) 20-25 MG per tablet TAKE ONE TABLET BY MOUTH EVERY DAY  90 tablet  3  . metFORMIN (GLUCOPHAGE) 1000 MG tablet Take 1 tablet (1,000 mg total) by mouth 2 (two) times daily with a meal.  180 tablet  3  . simvastatin (ZOCOR) 80 MG tablet TAKE ONE TABLET BY MOUTH EVERY DAY  90 tablet  3    No Known Allergies  Past Medical History  Diagnosis Date  . History of colonic polyps     Hyperplastic  . Diabetes mellitus type II 2002    Hospitalized for very high sugars  . Diverticulosis of colon   . GERD (gastroesophageal reflux disease)   . Hypertension   . Phimosis 2003    Repair    Past Surgical History  Procedure Date  . Hydrocele excision / repair 10/07    Lonna Cobb)    Family History  Problem Relation Age of Onset  . Diabetes Mother   . Hypertension Mother   . Diabetes Father   . Mental illness Brother     Hx of schizophrenia  . Diabetes Brother   . Hypertension Brother      History   Social History  . Marital Status: Married    Spouse Name: N/A    Number of Children: 3  . 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 Smoking status: Former Smoker -- 37 years    Types: Cigarettes    Quit date: 02/24/1998  . Smokeless tobacco: Never Used  . Alcohol Use: No  . Drug Use: No  . Sexually Active: Not on file   Other Topics Concern  . Not on file   Social History Narrative  . No narrative on file   Review of Systems Feels well No GI problems     Objective:   Physical Exam  Constitutional: He appears well-developed and well-nourished. No distress.  Neck: Normal range of motion. Neck supple.  Cardiovascular: Normal rate, regular rhythm, normal heart sounds and intact distal pulses.  Exam reveals no gallop.   No murmur heard. Pulmonary/Chest: Effort normal and breath sounds normal. No respiratory distress. He has no wheezes. He has no rales.  Musculoskeletal:       1+ symmetric calf swelling No tenderness or Homan's sign  Normal foot flexion  Lymphadenopathy:    He has no cervical adenopathy.  Neurological:       Increased sensitivity to fine touch on plantar feet  Skin:       Several small open areas on anterior calves No inflammation          Assessment & Plan:

## 2011-02-20 NOTE — Assessment & Plan Note (Signed)
Seems to be from venous insufficiency Nothing to suggest CHF, liver or renal disease Came on after prolonged standing Somewhat better now No findings to suggest DVT  Discussed local care of open areas---neosporin or similar Support socks and elevation Will try to avoid furosemide

## 2011-02-20 NOTE — Patient Instructions (Signed)
Please try support socks for your legs when you will be standing for a while (like at work)

## 2011-02-21 ENCOUNTER — Ambulatory Visit: Payer: BC Managed Care – PPO | Admitting: Internal Medicine

## 2011-03-18 ENCOUNTER — Other Ambulatory Visit: Payer: Self-pay | Admitting: Radiology

## 2011-03-18 DIAGNOSIS — E119 Type 2 diabetes mellitus without complications: Secondary | ICD-10-CM

## 2011-03-21 ENCOUNTER — Other Ambulatory Visit (INDEPENDENT_AMBULATORY_CARE_PROVIDER_SITE_OTHER): Payer: BC Managed Care – PPO

## 2011-03-21 DIAGNOSIS — E119 Type 2 diabetes mellitus without complications: Secondary | ICD-10-CM

## 2011-03-28 ENCOUNTER — Other Ambulatory Visit: Payer: Self-pay | Admitting: Internal Medicine

## 2011-05-05 ENCOUNTER — Ambulatory Visit (INDEPENDENT_AMBULATORY_CARE_PROVIDER_SITE_OTHER): Payer: BC Managed Care – PPO | Admitting: Family Medicine

## 2011-05-05 ENCOUNTER — Ambulatory Visit: Admission: RE | Admit: 2011-05-05 | Payer: BC Managed Care – PPO | Source: Ambulatory Visit

## 2011-05-05 ENCOUNTER — Encounter: Payer: Self-pay | Admitting: Family Medicine

## 2011-05-05 ENCOUNTER — Ambulatory Visit (INDEPENDENT_AMBULATORY_CARE_PROVIDER_SITE_OTHER)
Admission: RE | Admit: 2011-05-05 | Discharge: 2011-05-05 | Disposition: A | Discharge status: Home / Self Care | Payer: BC Managed Care – PPO | Source: Ambulatory Visit | Attending: Family Medicine | Admitting: Family Medicine

## 2011-05-05 VITALS — BP 120/88 | HR 89 | Temp 98.5°F | Wt 272.2 lb

## 2011-05-05 DIAGNOSIS — J40 Bronchitis, not specified as acute or chronic: Secondary | ICD-10-CM | POA: Insufficient documentation

## 2011-05-05 DIAGNOSIS — R05 Cough: Secondary | ICD-10-CM

## 2011-05-05 MED ORDER — HYDROCOD POLST-CHLORPHEN POLST 10-8 MG/5ML PO LQCR: 5.0000 mL | Freq: Every evening | ORAL | Status: DC | PRN

## 2011-05-05 MED ORDER — AZITHROMYCIN 250 MG PO TABS: ORAL_TABLET | ORAL | Status: AC

## 2011-05-05 NOTE — Progress Notes (Signed)
  Subjective:    Patient ID: Adam [name], male    DOB: [birth year], 61 y.o.   MRN: [id number]  HPI CC: cough  61 yo with h/o HTN, HLD, T2DM presents with 4d h/o cough productive of mild green sputum, sore in abd from coughing, wheezing when laying down, headache.  Coughing fits.  Did have emesis when this started, none since.  Mild diarrhea as well.  Sinuses seem a bit stopped up.  Has been taking tylenol which may have covered low grade fever.  + chills initially.  No abd pain, n/v, ST, ear or tooth pain.  No smokers at home.  + daughter sick with PNA recently.  No h/o COPD, asthma.  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  Endorses good control of diabetes. Lab Results  Component Value Date   HGBA1C 9.3* 03/21/2011    Past Medical History  Diagnosis Date  . History of colonic polyps     Hyperplastic  . Diabetes mellitus type II 2002    Hospitalized for very high sugars  . Diverticulosis of colon   . GERD (gastroesophageal reflux disease)   . Hypertension   . Phimosis 2003    Repair  . Venous insufficiency    Review of Systems Per HPI    Objective:   Physical Exam  Nursing note and vitals reviewed. Constitutional: He appears well-developed and well-nourished. No distress.  HENT:  Head: Normocephalic and atraumatic.  Right Ear: Hearing, tympanic membrane, external ear and ear canal normal.  Left Ear: Hearing, tympanic membrane, external ear and ear canal normal.  Nose: Nose normal. No mucosal edema or rhinorrhea. Right sinus exhibits no maxillary sinus tenderness and no frontal sinus tenderness. Left sinus exhibits no maxillary sinus tenderness and no frontal sinus tenderness.  Mouth/Throat: Uvula is midline, oropharynx is clear and moist and mucous membranes are normal. No oropharyngeal exudate, posterior oropharyngeal edema, posterior oropharyngeal erythema or tonsillar abscesses.  Eyes: Conjunctivae and EOM are normal. Pupils are equal, round, and reactive to light. No  scleral icterus.  Neck: Normal range of motion. Neck supple. No thyromegaly present.  Cardiovascular: Normal rate, regular rhythm, normal heart sounds and intact distal pulses.   No murmur heard. Pulmonary/Chest: No accessory muscle usage. No respiratory distress (mild increased WOB with deep breathing). He has decreased breath sounds in the right lower field. He has no wheezes. He has no rhonchi. He has no rales.       Crackles bibasilarly  Lymphadenopathy:    He has no cervical adenopathy.  Skin: Skin is warm and dry. No rash noted.       Assessment & Plan:

## 2011-05-05 NOTE — Assessment & Plan Note (Addendum)
Anticipate bronchitis but given lower O2 sat at 93% RA and decreased breath RLL, will check CXR to rule out PNA.  Xray clear on my read. Treat with zpack to cover atypicals. tussionex for cough. Out of work today and tomorrow. Update [place] if not improving as expected.

## 2011-05-05 NOTE — Patient Instructions (Addendum)
Chest xray looking ok.,  No pneumonia Treat with zpack. Anticipate bronchitis leading to cough. tussionex for cough at night. mucinex with plenty of fluid to break up mucous. Push fluids and plenty of rest. Please return if you are not improving as expected, or if you have high fevers (>101.5) or difficulty swallowing or worsening productive cough. Call clinic with questions.  Good to see you today.

## 2011-06-20 ENCOUNTER — Encounter: Payer: BC Managed Care – PPO | Admitting: Internal Medicine

## 2011-06-20 DIAGNOSIS — Z0289 Encounter for other administrative examinations: Secondary | ICD-10-CM

## 2011-06-26 ENCOUNTER — Ambulatory Visit (INDEPENDENT_AMBULATORY_CARE_PROVIDER_SITE_OTHER): Payer: BC Managed Care – PPO | Admitting: Internal Medicine

## 2011-06-26 ENCOUNTER — Encounter: Payer: Self-pay | Admitting: Internal Medicine

## 2011-06-26 VITALS — BP 128/88 | HR 83 | Temp 97.7°F | Ht 71.0 in | Wt 269.0 lb

## 2011-06-26 DIAGNOSIS — E1149 Type 2 diabetes mellitus with other diabetic neurological complication: Secondary | ICD-10-CM

## 2011-06-26 DIAGNOSIS — E1142 Type 2 diabetes mellitus with diabetic polyneuropathy: Secondary | ICD-10-CM

## 2011-06-26 DIAGNOSIS — Z0000 Encounter for general adult medical examination without abnormal findings: Secondary | ICD-10-CM

## 2011-06-26 DIAGNOSIS — I872 Venous insufficiency (chronic) (peripheral): Secondary | ICD-10-CM

## 2011-06-26 DIAGNOSIS — I10 Essential (primary) hypertension: Secondary | ICD-10-CM

## 2011-06-26 DIAGNOSIS — E785 Hyperlipidemia, unspecified: Secondary | ICD-10-CM

## 2011-06-26 MED ORDER — "INSULIN SYRINGE-NEEDLE U-100 28G X 1/2"" 0.3 ML MISC": 100.0000 [IU] | Freq: Two times a day (BID) | Status: DC

## 2011-06-26 NOTE — Patient Instructions (Signed)
Please check with your insurance to see if zostavax (shingles vaccine) is covered

## 2011-06-26 NOTE — Assessment & Plan Note (Signed)
No problems with meds Due for labs 

## 2011-06-26 NOTE — Assessment & Plan Note (Signed)
BP Readings from Last 3 Encounters:  06/26/11 128/88  05/05/11 120/88  02/20/11 140/80   Good control No changes

## 2011-06-26 NOTE — Assessment & Plan Note (Signed)
Trying to work on fitness Will do PSA Colon recall per GI

## 2011-06-26 NOTE — Assessment & Plan Note (Signed)
Better now 

## 2011-06-26 NOTE — Progress Notes (Signed)
Subjective:    Patient ID: Adam [name], male    DOB: [birth year], 61 y.o.   MRN: [id number]  HPI Doing well Still trying to watch his diet Still drinks diet sodas---converting to more water Able to be consistent taking the insulin  Checks sugars 1-2 per day Usually around 140 fasting or pre-supper Occ hypoglycemic spells--- 1-2 per month. Just nervous feeling and better with food  Right hip is painful at times---especially after lying on it for a while Occ during walking but not as much Points to lateral inguinal area Tylenol helps sometimes  Leg swelling is better now  Current Outpatient Prescriptions on File Prior to Visit  Medication Sig Dispense Refill  . aspirin 81 MG tablet Take 81 mg by mouth daily.        Marland Kitchen glipiZIDE (GLUCOTROL) 10 MG tablet TAKE ONE TABLET BY MOUTH EVERY DAY  90 tablet  3  . insulin aspart protamine-insulin aspart (NOVOLOG MIX 70/30) (70-30) 100 UNIT/ML injection Inject 100 Units into the skin 2 (two) times daily with a meal.  200 mL  3  . lisinopril-hydrochlorothiazide (PRINZIDE,ZESTORETIC) 20-25 MG per tablet TAKE ONE TABLET BY MOUTH EVERY DAY  90 tablet  3  . metFORMIN (GLUCOPHAGE) 1000 MG tablet Take 1 tablet (1,000 mg total) by mouth 2 (two) times daily with a meal.  180 tablet  3  . simvastatin (ZOCOR) 80 MG tablet TAKE ONE TABLET BY MOUTH EVERY DAY  90 tablet  3    No Known Allergies  Past Medical History  Diagnosis Date  . History of colonic polyps     Hyperplastic  . Diabetes mellitus type II 2002    Hospitalized for very high sugars  . Diverticulosis of colon   . GERD (gastroesophageal reflux disease)   . Hypertension   . Phimosis 2003    Repair  . Venous insufficiency     Past Surgical History  Procedure Date  . Hydrocele excision / repair 10/07    Lonna Cobb)    Family History  Problem Relation Age of Onset  . Diabetes Mother   . Hypertension Mother   . Diabetes Father   . Mental illness Brother     Hx of schizophrenia   . Diabetes Brother   . Hypertension Brother     History   Social History  . Marital Status: Married    Spouse Name: N/A    Number of Children: 3  . Years of Education: N/A   Occupational History  . 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 Smoking status: Former Smoker -- 37 years    Types: Cigarettes    Quit date: 02/24/1998  . Smokeless tobacco: Never Used  . Alcohol Use: No  . Drug Use: No  . Sexually Active: Not on file   Other Topics Concern  . Not on file   Social History Narrative  . No narrative on file   Review of Systems  Constitutional: Negative for fatigue and unexpected weight change.       Wears seat belt  HENT: Negative for hearing loss, congestion, rhinorrhea, dental problem and tinnitus.        Regular with dentist  Eyes: Negative for visual disturbance.       Occ sees floating dot--last eye exam was fine No unilateral vision loss Slight elevated pressure on right---not at treatment levels  Respiratory: Negative for cough, chest tightness and shortness of breath.   Cardiovascular: Negative for chest pain, palpitations  and leg swelling.  Gastrointestinal: Negative for nausea, vomiting, abdominal pain, constipation and blood in stool.       Metformin will act as laxative at times but generally not a problem No heartburn  Genitourinary: Positive for difficulty urinating.       Mild trouble initiating stream No sexual problems  Musculoskeletal: Negative for back pain, joint swelling and arthralgias.  Skin: Negative for rash.       No suspicious areas on skin  Neurological: Negative for dizziness, syncope, weakness, light-headedness and headaches.       Mild numbness in feet is better  Hematological: Negative for adenopathy. Does not bruise/bleed easily.  Psychiatric/Behavioral: Negative for sleep disturbance and dysphoric mood. The patient is not nervous/anxious.        Objective:   Physical Exam  Constitutional: He is oriented  to person, place, and time. He appears well-developed and well-nourished. No distress.  HENT:  Head: Normocephalic and atraumatic.  Right Ear: External ear normal.  Left Ear: External ear normal.  Mouth/Throat: Oropharynx is clear and moist. No oropharyngeal exudate.  Eyes: Conjunctivae and EOM are normal. Pupils are equal, round, and reactive to light.  Neck: Normal range of motion. Neck supple. No thyromegaly present.  Cardiovascular: Normal rate, regular rhythm, normal heart sounds and intact distal pulses.  Exam reveals no gallop.   No murmur heard. Pulmonary/Chest: Effort normal and breath sounds normal. No respiratory distress. He has no wheezes. He has no rales.  Abdominal: Soft. There is no tenderness.  Musculoskeletal: Normal range of motion. He exhibits no edema and no tenderness.  Lymphadenopathy:    He has no cervical adenopathy.    He has no axillary adenopathy.  Neurological: He is alert and oriented to person, place, and time.       Slight decreased sensation in plantar feet  Skin: No rash noted. No erythema.  Psychiatric: He has a normal mood and affect. His behavior is normal. Thought content normal.          Assessment & Plan:

## 2011-06-26 NOTE — Assessment & Plan Note (Signed)
Lab Results  Component Value Date   HGBA1C 9.3* 03/21/2011   Had been running around 15---mostly due to insulin noncompliance due to cost Now regular Consider slightly increasing AM insulin if still over 9% but he does have some hypoglycemic reactions

## 2011-06-27 LAB — CBC WITH DIFFERENTIAL/PLATELET
Eosinophils Relative: 3.1 % (ref 0.0–5.0)
HCT: 43.5 % (ref 39.0–52.0)
Hemoglobin: 14.1 g/dL (ref 13.0–17.0)
Lymphs Abs: 3.8 10*3/uL (ref 0.7–4.0)
Monocytes Relative: 6.6 % (ref 3.0–12.0)
Platelets: 242 10*3/uL (ref 150.0–400.0)
WBC: 8 10*3/uL (ref 4.5–10.5)

## 2011-06-27 LAB — HEPATIC FUNCTION PANEL
AST: 25 U/L (ref 0–37)
Alkaline Phosphatase: 47 U/L (ref 39–117)
Bilirubin, Direct: 0 mg/dL (ref 0.0–0.3)
Total Bilirubin: 0.6 mg/dL (ref 0.3–1.2)

## 2011-06-27 LAB — BASIC METABOLIC PANEL
BUN: 19 mg/dL (ref 6–23)
GFR: 95.03 mL/min (ref >60.00)
Potassium: 3.9 mEq/L (ref 3.5–5.1)
Sodium: 140 mEq/L (ref 135–145)

## 2011-06-27 LAB — MICROALBUMIN / CREATININE URINE RATIO
Creatinine,U: 151.9 mg/dL
Microalb, Ur: 281.1 mg/dL — ABNORMAL HIGH (ref 0.0–1.9)

## 2011-06-27 LAB — TSH: TSH: 1.6 u[IU]/mL (ref 0.35–5.50)

## 2011-07-03 ENCOUNTER — Encounter: Payer: Self-pay | Admitting: *Deleted

## 2011-10-18 ENCOUNTER — Other Ambulatory Visit: Payer: Self-pay | Admitting: Internal Medicine

## 2011-12-10 ENCOUNTER — Other Ambulatory Visit: Payer: Self-pay | Admitting: Internal Medicine

## 2011-12-29 ENCOUNTER — Other Ambulatory Visit: Payer: Self-pay | Admitting: *Deleted

## 2011-12-29 ENCOUNTER — Ambulatory Visit (INDEPENDENT_AMBULATORY_CARE_PROVIDER_SITE_OTHER): Payer: BC Managed Care – PPO | Admitting: Internal Medicine

## 2011-12-29 ENCOUNTER — Encounter: Payer: Self-pay | Admitting: Internal Medicine

## 2011-12-29 VITALS — BP 140/80 | HR 76 | Temp 98.2°F | Wt 273.0 lb

## 2011-12-29 DIAGNOSIS — E1129 Type 2 diabetes mellitus with other diabetic kidney complication: Secondary | ICD-10-CM

## 2011-12-29 DIAGNOSIS — J209 Acute bronchitis, unspecified: Secondary | ICD-10-CM | POA: Insufficient documentation

## 2011-12-29 DIAGNOSIS — E785 Hyperlipidemia, unspecified: Secondary | ICD-10-CM

## 2011-12-29 DIAGNOSIS — E1165 Type 2 diabetes mellitus with hyperglycemia: Secondary | ICD-10-CM

## 2011-12-29 DIAGNOSIS — E1149 Type 2 diabetes mellitus with other diabetic neurological complication: Secondary | ICD-10-CM

## 2011-12-29 MED ORDER — ATORVASTATIN CALCIUM 80 MG PO TABS: 80.0000 mg | ORAL_TABLET | Freq: Every day | ORAL | Status: DC

## 2011-12-29 MED ORDER — "INSULIN SYRINGE-NEEDLE U-100 28G X 1/2"" 0.3 ML MISC": 100.0000 [IU] | Freq: Two times a day (BID) | Status: DC

## 2011-12-29 NOTE — Assessment & Plan Note (Signed)
Not at goal  Will change to atorvastatin

## 2011-12-29 NOTE — Progress Notes (Signed)
Subjective:    Patient ID: Adam [name], male    DOB: 1950/12/27, 61 y.o.   MRN: [id number]  HPI Grandson had pneumonia Hospitalized and he has been staying with him Did catch bad cold  Ongoing for 6-7 days Seems to be improving some---throat now that sore Some wheezing at night No fever  Feels his diabetes is okay Did have some hypoglycemic spells-- blurry vision with sugar 50-60 Mostly ~120 fasting Did go up on insulin after last blood work  No problems with the statin Discussed his still high levels  No chest pain No regular dyspnea Occ mild edema on top of feet---not regular No dizziness or syncope  Current Outpatient Prescriptions on File Prior to Visit  Medication Sig Dispense Refill  . aspirin 81 MG tablet Take 81 mg by mouth daily.        Marland Kitchen glipiZIDE (GLUCOTROL) 10 MG tablet TAKE ONE TABLET BY MOUTH EVERY DAY  90 tablet  3  . Insulin Syringe-Needle U-100 (B-D INS SYR MICROFINE .3CC/28G) 28G X 1/2" 0.3 ML MISC Inject 100 Units as directed 2 (two) times daily.  200 each  11  . lisinopril-hydrochlorothiazide (PRINZIDE,ZESTORETIC) 20-25 MG per tablet TAKE ONE TABLET BY MOUTH EVERY DAY  90 tablet  2  . metFORMIN (GLUCOPHAGE) 1000 MG tablet Take 1 tablet (1,000 mg total) by mouth 2 (two) times daily with a meal.  180 tablet  3  . simvastatin (ZOCOR) 80 MG tablet TAKE ONE TABLET BY MOUTH EVERY DAY  90 tablet  3  . [DISCONTINUED] insulin aspart protamine-insulin aspart (NOVOLOG MIX 70/30) (70-30) 100 UNIT/ML injection Inject 100 Units into the skin 2 (two) times daily with a meal.  200 mL  3    No Known Allergies  Past Medical History  Diagnosis Date  . History of colonic polyps     Hyperplastic  . Diabetes mellitus type II 2002    Hospitalized for very high sugars  . Diverticulosis of colon   . GERD (gastroesophageal reflux disease)   . Hypertension   . Phimosis 2003    Repair  . Venous insufficiency     Past Surgical History  Procedure Date  . Hydrocele  excision / repair 10/07    Lonna Cobb)    Family History  Problem Relation Age of Onset  . Diabetes Mother   . Hypertension Mother   . Diabetes Father   . Mental illness Brother     Hx of schizophrenia  . Diabetes Brother   . Hypertension Brother     History   Social History  . Marital Status: Married    Spouse Name: N/A    Number of Children: 3  . 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 Smoking status: Former Smoker -- 37 years    Types: Cigarettes    Quit date: 02/24/1998  . Smokeless tobacco: Never Used  . Alcohol Use: No  . Drug Use: No  . Sexually Active: Not on file   Other Topics Concern  . Not on file   Social History Narrative  . No narrative on file   Review of Systems Weight is up a few pounds Tries to be careful with diet---rarely has fried foods Just water or rare lemonade (with equal) Still walking regularly    Objective:   Physical Exam  Constitutional: He appears well-developed and well-nourished. No distress.       Some coarse  cough  HENT:  Mouth/Throat: No oropharyngeal exudate.       Mild pharyngeal injection  Neck: Normal range of motion. Neck supple.  Cardiovascular: Normal rate, regular rhythm, normal heart sounds and intact distal pulses.  Exam reveals no gallop.   No murmur heard. Pulmonary/Chest: Effort normal. No respiratory distress. He has wheezes. He has no rales.       Not tight but mild expiratory wheezes throughout  Musculoskeletal: He exhibits no edema and no tenderness.  Lymphadenopathy:    He has no cervical adenopathy.  Skin:       No foot lesions  Psychiatric: He has a normal mood and affect. His behavior is normal. Thought content normal.          Assessment & Plan:

## 2011-12-29 NOTE — Patient Instructions (Signed)
Please stop the simvastatin and change to atorvastatin for your cholesterol. Set up blood work appt in about 6 weeks for lipid and hepatic (272.4). If the prescription is too much money, continue the simvastatin  Please call in the next few days if your cough and infection are worse. I will phone in some medications for you

## 2011-12-29 NOTE — Assessment & Plan Note (Signed)
May still be viral No dyspnea so will hold off on prednisone for the wheezing If worsens, will try empiric antibiotic (amoxil 1000mg  bid)

## 2011-12-29 NOTE — Assessment & Plan Note (Signed)
BP Readings from Last 3 Encounters:  12/29/11 140/80  06/26/11 128/88  05/05/11 120/88   Will consider increasing lisinopril if BP goes up

## 2011-12-29 NOTE — Assessment & Plan Note (Signed)
Feet are okay Will recheck a1c

## 2011-12-30 ENCOUNTER — Encounter: Payer: Self-pay | Admitting: *Deleted

## 2012-01-01 ENCOUNTER — Other Ambulatory Visit: Payer: Self-pay | Admitting: Internal Medicine

## 2012-01-05 ENCOUNTER — Other Ambulatory Visit: Payer: Self-pay | Admitting: Internal Medicine

## 2012-02-09 ENCOUNTER — Encounter: Payer: Self-pay | Admitting: *Deleted

## 2012-02-09 ENCOUNTER — Other Ambulatory Visit (INDEPENDENT_AMBULATORY_CARE_PROVIDER_SITE_OTHER): Payer: BC Managed Care – PPO

## 2012-02-09 DIAGNOSIS — E785 Hyperlipidemia, unspecified: Secondary | ICD-10-CM

## 2012-02-09 LAB — HEPATIC FUNCTION PANEL
AST: 20 U/L (ref 0–37)
Alkaline Phosphatase: 52 U/L (ref 39–117)
Bilirubin, Direct: 0 mg/dL (ref 0.0–0.3)
Total Bilirubin: 0.6 mg/dL (ref 0.3–1.2)

## 2012-02-09 LAB — LIPID PANEL
LDL Cholesterol: 92 mg/dL (ref 0–99)
Total CHOL/HDL Ratio: 4
Triglycerides: 140 mg/dL (ref 0.0–149.0)

## 2012-02-17 ENCOUNTER — Other Ambulatory Visit: Payer: Self-pay | Admitting: Internal Medicine

## 2012-07-06 ENCOUNTER — Encounter: Payer: Self-pay | Admitting: Internal Medicine

## 2012-07-06 ENCOUNTER — Ambulatory Visit (INDEPENDENT_AMBULATORY_CARE_PROVIDER_SITE_OTHER): Payer: BC Managed Care – PPO | Admitting: Internal Medicine

## 2012-07-06 VITALS — BP 130/80 | HR 75 | Temp 97.7°F | Ht 71.0 in | Wt 278.0 lb

## 2012-07-06 DIAGNOSIS — Z0000 Encounter for general adult medical examination without abnormal findings: Secondary | ICD-10-CM

## 2012-07-06 DIAGNOSIS — E785 Hyperlipidemia, unspecified: Secondary | ICD-10-CM

## 2012-07-06 DIAGNOSIS — I10 Essential (primary) hypertension: Secondary | ICD-10-CM

## 2012-07-06 DIAGNOSIS — Z125 Encounter for screening for malignant neoplasm of prostate: Secondary | ICD-10-CM

## 2012-07-06 DIAGNOSIS — E1149 Type 2 diabetes mellitus with other diabetic neurological complication: Secondary | ICD-10-CM

## 2012-07-06 DIAGNOSIS — Z23 Encounter for immunization: Secondary | ICD-10-CM

## 2012-07-06 LAB — BASIC METABOLIC PANEL
BUN: 17 mg/dL (ref 6–23)
Calcium: 9 mg/dL (ref 8.4–10.5)
Creatinine, Ser: 1.1 mg/dL (ref 0.4–1.5)

## 2012-07-06 LAB — CBC WITH DIFFERENTIAL/PLATELET
Eosinophils Absolute: 0.4 10*3/uL (ref 0.0–0.7)
Eosinophils Relative: 5.3 % — ABNORMAL HIGH (ref 0.0–5.0)
HCT: 40.3 % (ref 39.0–52.0)
Lymphs Abs: 3.8 10*3/uL (ref 0.7–4.0)
MCHC: 33.4 g/dL (ref 30.0–36.0)
MCV: 86.1 fl (ref 78.0–100.0)
Monocytes Absolute: 0.5 10*3/uL (ref 0.1–1.0)
Neutrophils Relative %: 33 % — ABNORMAL LOW (ref 43.0–77.0)
Platelets: 232 10*3/uL (ref 150.0–400.0)
RDW: 13.6 % (ref 11.5–14.6)
WBC: 7 10*3/uL (ref 4.5–10.5)

## 2012-07-06 LAB — PSA: PSA: 1.08 ng/mL (ref 0.10–4.00)

## 2012-07-06 LAB — LIPID PANEL
HDL: 45.2 mg/dL (ref >39.00)
Total CHOL/HDL Ratio: 4
Triglycerides: 151 mg/dL — ABNORMAL HIGH (ref 0.0–149.0)
VLDL: 30.2 mg/dL (ref 0.0–40.0)

## 2012-07-06 LAB — HEPATIC FUNCTION PANEL
AST: 24 U/L (ref 0–37)
Albumin: 3 g/dL — ABNORMAL LOW (ref 3.5–5.2)
Total Bilirubin: 0.3 mg/dL (ref 0.3–1.2)

## 2012-07-06 LAB — TSH: TSH: 1.82 u[IU]/mL (ref 0.35–5.50)

## 2012-07-06 NOTE — Patient Instructions (Signed)
Please get stability shoes (like at [employer] in [employer] [place])

## 2012-07-06 NOTE — Progress Notes (Signed)
Subjective:    Patient ID: Adam [name], male    DOB: 09/08/1950, 62 y.o.   MRN: [id number]  HPI Here for physical  Has sore spot on left foot No known injury Not open Wife put some green alcohol an dit may have helped some Started a couple of weeks ago  Trying to watch eating, etc Walks when he can Weight is up a few pounds  Checks sugars in AM and occ in afternoon Usually under 100 in AM No serious hypoglycemic reactions--has had a few mild ones (feels shaky and can take cracker) Had eye exam last month  Current Outpatient Prescriptions on File Prior to Visit  Medication Sig Dispense Refill  . aspirin 81 MG tablet Take 81 mg by mouth daily.        Marland Kitchen atorvastatin (LIPITOR) 80 MG tablet Take 1 tablet (80 mg total) by mouth daily.  30 tablet  11  . glipiZIDE (GLUCOTROL) 10 MG tablet TAKE ONE TABLET BY MOUTH EVERY DAY  90 tablet  2  . Insulin Syringe-Needle U-100 (B-D INS SYR MICROFINE .3CC/28G) 28G X 1/2" 0.3 ML MISC Inject 100 Units as directed 2 (two) times daily.  200 each  11  . lisinopril-hydrochlorothiazide (PRINZIDE,ZESTORETIC) 20-25 MG per tablet TAKE ONE TABLET BY MOUTH EVERY DAY  90 tablet  2   No current facility-administered medications on file prior to visit.    No Known Allergies  Past Medical History  Diagnosis Date  . History of colonic polyps     Hyperplastic  . Diabetes mellitus type II 2002    Hospitalized for very high sugars  . Diverticulosis of colon   . GERD (gastroesophageal reflux disease)   . Hypertension   . Phimosis 2003    Repair  . Venous insufficiency     Past Surgical History  Procedure Laterality Date  . Hydrocele excision / repair  10/07    Lonna Cobb)    Family History  Problem Relation Age of Onset  . Diabetes Mother   . Hypertension Mother   . Diabetes Father   . Mental illness Brother     Hx of schizophrenia  . Diabetes Brother   . Hypertension Brother     History   Social History  . Marital Status: Married    Spouse Name: N/A    Number of Children: 3  . 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 Smoking status: Former Smoker -- 37 years    Types: Cigarettes    Quit date: 02/24/1998  . Smokeless tobacco: Never Used  . Alcohol Use: No  . Drug Use: No  . Sexually Active: Not on file   Other Topics Concern  . Not on file   Social History Narrative  . No narrative on file   Review of Systems  Constitutional: Negative for fatigue and unexpected weight change.       Wears seat belt  HENT: Negative for hearing loss, congestion, rhinorrhea, dental problem and tinnitus.        Regular with dentist  Eyes: Negative for visual disturbance.       No diplopia or unilateral vision loss On med for glaucoma  Respiratory: Negative for cough, chest tightness and shortness of breath.   Cardiovascular: Positive for leg swelling. Negative for chest pain and palpitations.       Some swelling in tops of feet--no pain  Gastrointestinal: Negative for nausea, vomiting,  abdominal pain, constipation and blood in stool.       No heartburn  Endocrine: Negative for cold intolerance and heat intolerance.  Genitourinary: Negative for frequency and difficulty urinating.       No sex--no problem  Musculoskeletal: Positive for myalgias. Negative for back pain, joint swelling and arthralgias.       Had leg cramps and pain after busy day walking and doing lawns  Skin: Negative for rash.       Left foot lesion  Allergic/Immunologic: Negative for environmental allergies and immunocompromised state.  Neurological: Negative for dizziness, syncope, weakness, light-headedness, numbness and headaches.  Hematological: Negative for adenopathy. Does not bruise/bleed easily.  Psychiatric/Behavioral: Negative for sleep disturbance and dysphoric mood. The patient is not nervous/anxious.        Objective:   Physical Exam  Constitutional: He is oriented to  person, place, and time. He appears well-developed and well-nourished. No distress.  HENT:  Head: Normocephalic and atraumatic.  Right Ear: External ear normal.  Left Ear: External ear normal.  Mouth/Throat: Oropharynx is clear and moist. No oropharyngeal exudate.  Eyes: Conjunctivae and EOM are normal. Pupils are equal, round, and reactive to light.  Neck: Normal range of motion. Neck supple. No thyromegaly present.  Cardiovascular: Normal rate, regular rhythm, normal heart sounds and intact distal pulses.   Faint distal pulses  Pulmonary/Chest: Effort normal and breath sounds normal. No respiratory distress. He has no wheezes. He has no rales.  Abdominal: Soft. There is no tenderness.  Musculoskeletal: He exhibits edema. He exhibits no tenderness.  Trace to 1+ left ankle edema  Lymphadenopathy:    He has no cervical adenopathy.  Neurological: He is alert and oriented to person, place, and time.  Skin: No rash noted. No erythema.  Small dark non tender nodule on mid lateral left foot---appears to be from pressure  Psychiatric: He has a normal mood and affect. His behavior is normal.          Assessment & Plan:

## 2012-07-06 NOTE — Assessment & Plan Note (Signed)
Will check PSA after discussion Tdap today Rx for zostavax Discussed fitness

## 2012-07-06 NOTE — Assessment & Plan Note (Signed)
No problems with statin 

## 2012-07-06 NOTE — Assessment & Plan Note (Signed)
BP Readings from Last 3 Encounters:  07/06/12 130/80  12/29/11 140/80  06/26/11 128/88   Good control Due for labs

## 2012-07-06 NOTE — Addendum Note (Signed)
Addended by: Sueanne Margarita on: 07/06/2012 10:30 AM   Modules accepted: Orders

## 2012-07-06 NOTE — Assessment & Plan Note (Signed)
Still seems to have reasonable control Due for labs

## 2012-07-07 ENCOUNTER — Encounter: Payer: Self-pay | Admitting: *Deleted

## 2012-09-02 ENCOUNTER — Other Ambulatory Visit: Payer: Self-pay

## 2012-09-23 ENCOUNTER — Other Ambulatory Visit: Payer: Self-pay | Admitting: Internal Medicine

## 2012-09-24 ENCOUNTER — Other Ambulatory Visit: Payer: Self-pay | Admitting: *Deleted

## 2012-09-24 MED ORDER — "INSULIN SYRINGE-NEEDLE U-100 30G X 1/2"" 1 ML MISC": Status: DC

## 2012-11-21 ENCOUNTER — Other Ambulatory Visit: Payer: Self-pay | Admitting: Internal Medicine

## 2012-12-12 ENCOUNTER — Other Ambulatory Visit: Payer: Self-pay | Admitting: Internal Medicine

## 2013-01-07 ENCOUNTER — Encounter: Payer: Self-pay | Admitting: Internal Medicine

## 2013-01-07 ENCOUNTER — Other Ambulatory Visit: Payer: Self-pay | Admitting: Internal Medicine

## 2013-01-07 ENCOUNTER — Ambulatory Visit (INDEPENDENT_AMBULATORY_CARE_PROVIDER_SITE_OTHER): Payer: BC Managed Care – PPO | Admitting: Internal Medicine

## 2013-01-07 VITALS — BP 128/80 | HR 63 | Temp 98.4°F | Ht 71.0 in | Wt 273.0 lb

## 2013-01-07 DIAGNOSIS — I10 Essential (primary) hypertension: Secondary | ICD-10-CM

## 2013-01-07 DIAGNOSIS — E1149 Type 2 diabetes mellitus with other diabetic neurological complication: Secondary | ICD-10-CM

## 2013-01-07 DIAGNOSIS — E1142 Type 2 diabetes mellitus with diabetic polyneuropathy: Secondary | ICD-10-CM

## 2013-01-07 DIAGNOSIS — I872 Venous insufficiency (chronic) (peripheral): Secondary | ICD-10-CM

## 2013-01-07 LAB — HEMOGLOBIN A1C: Hgb A1c MFr Bld: 9.7 % — ABNORMAL HIGH (ref 4.6–6.5)

## 2013-01-07 LAB — HM DIABETES FOOT EXAM

## 2013-01-07 NOTE — Progress Notes (Signed)
Pre-visit discussion using our clinic review tool. No additional management support is needed unless otherwise documented below in the visit note.  

## 2013-01-07 NOTE — Assessment & Plan Note (Signed)
Hopefully has improved with dietary changes If still over 9%, will send to Dr Elvera Lennox

## 2013-01-07 NOTE — Assessment & Plan Note (Signed)
Thick calves but no pitting

## 2013-01-07 NOTE — Assessment & Plan Note (Signed)
Has pre-ulcerative callous on left foot Will give Rx for diabetic shoes and inserts

## 2013-01-07 NOTE — Assessment & Plan Note (Signed)
BP Readings from Last 3 Encounters:  01/07/13 128/80  07/06/12 130/80  12/29/11 140/80   Good control No changes needed

## 2013-01-07 NOTE — Progress Notes (Signed)
Subjective:    Patient ID: Adam [name], male    DOB: 1950/10/18, 62 y.o.   MRN: [id number]  HPI He feels well  Discussed his last bad DM test He has improved his eating Cut back on fried food, more salads Walking a little Weight is down 5#  Has a spot on his foot No pain and has decreased sensation feels like hard corn Has stability shoes for work---not diabetic shoes No discharge  Checks sugars once a week or so 130 fasting is typical Still gets hypoglycemic reactions---mostly in AM if he isn't able to eat good breakfast  Current Outpatient Prescriptions on File Prior to Visit  Medication Sig Dispense Refill  . aspirin 81 MG tablet Take 81 mg by mouth daily.        Marland Kitchen atorvastatin (LIPITOR) 80 MG tablet Take 1 tablet (80 mg total) by mouth daily.  30 tablet  11  . B-D INS SYR MICROFINE 1CC/28G 28G X 1/2" 1 ML MISC INJECT 100 UNITS SQ AS DIRECTED TWICE DAILY  200 each  0  . insulin aspart protamine- aspart (NOVOLOG 70/30) (70-30) 100 UNIT/ML injection Inject 80-100 Units into the skin 2 (two) times daily with a meal.       . lisinopril-hydrochlorothiazide (PRINZIDE,ZESTORETIC) 20-25 MG per tablet TAKE ONE TABLET BY MOUTH EVERY DAY  90 tablet  0  . metFORMIN (GLUCOPHAGE) 1000 MG tablet TAKE ONE TABLET BY MOUTH TWICE DAILY WITH MEALS  180 tablet  0   No current facility-administered medications on file prior to visit.    No Known Allergies  Past Medical History  Diagnosis Date  . History of colonic polyps     Hyperplastic  . Diabetes mellitus type II 2002    Hospitalized for very high sugars  . Diverticulosis of colon   . GERD (gastroesophageal reflux disease)   . Hypertension   . Phimosis 2003    Repair  . Venous insufficiency     Past Surgical History  Procedure Laterality Date  . Hydrocele excision / repair  10/07    Lonna Cobb)    Family History  Problem Relation Age of Onset  . Diabetes Mother   . Hypertension Mother   . Diabetes Father   . Mental  illness Brother     Hx of schizophrenia  . Diabetes Brother   . Hypertension Brother     History   Social History  . Marital Status: Married    Spouse Name: N/A    Number of Children: 3  . 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 Smoking status: Former Smoker -- 37 years    Types: Cigarettes    Quit date: 02/24/1998  . Smokeless tobacco: Never Used  . Alcohol Use: No  . Drug Use: No  . Sexual Activity: Not on file   Other Topics Concern  . Not on file   Social History Narrative  . No narrative on file   Review of Systems Sleeps okay Bowels are fine    Objective:   Physical Exam  Constitutional: He appears well-developed and well-nourished. No distress.  Neck: Normal range of motion. Neck supple. No thyromegaly present.  Cardiovascular: Normal rate, regular rhythm, normal heart sounds and intact distal pulses.  Exam reveals no gallop.   No murmur heard. Faint but palpable pulses in feet  Pulmonary/Chest: Effort normal and breath sounds normal. No respiratory distress. He has no wheezes.  He has no rales.  Lymphadenopathy:    He has no cervical adenopathy.  Neurological:  Decreased sensation in feet  Skin: No rash noted. No erythema.  Callous along lateral mid left foot No inflammation  Psychiatric: He has a normal mood and affect. His behavior is normal.          Assessment & Plan:

## 2013-01-07 NOTE — Patient Instructions (Signed)
Please take the glipizide before lunch.

## 2013-01-10 ENCOUNTER — Other Ambulatory Visit: Payer: Self-pay | Admitting: Internal Medicine

## 2013-01-12 LAB — HM DIABETES EYE EXAM

## 2013-01-17 ENCOUNTER — Ambulatory Visit: Payer: BC Managed Care – PPO | Admitting: Internal Medicine

## 2013-01-17 DIAGNOSIS — Z0289 Encounter for other administrative examinations: Secondary | ICD-10-CM

## 2013-01-31 ENCOUNTER — Ambulatory Visit (INDEPENDENT_AMBULATORY_CARE_PROVIDER_SITE_OTHER): Payer: BC Managed Care – PPO | Admitting: Internal Medicine

## 2013-01-31 ENCOUNTER — Encounter: Payer: Self-pay | Admitting: Internal Medicine

## 2013-01-31 VITALS — BP 130/80 | HR 93 | Temp 98.5°F | Ht 71.0 in | Wt 273.0 lb

## 2013-01-31 DIAGNOSIS — E1149 Type 2 diabetes mellitus with other diabetic neurological complication: Secondary | ICD-10-CM

## 2013-01-31 MED ORDER — INSULIN ASPART PROT & ASPART (70-30 MIX) 100 UNIT/ML ~~LOC~~ SUSP: SUBCUTANEOUS | Status: DC

## 2013-01-31 NOTE — Progress Notes (Signed)
Patient ID: Adam [name], male   DOB: 02/27/1950, 62 y.o.   MRN: [id number]  HPI: Adam [name] is a 62 y.o.-year-old male, referred by his PCP, Dr. Alphonsus Sias, for management of DM2, non-insulin-dependent, uncontrolled, with complications (PN, venous insufficiency).  Patient has been diagnosed with diabetes in 2004; he started insulin in 2-3 years ago. Last hemoglobin A1c was: Lab Results  Component Value Date   HGBA1C 9.7* 01/07/2013   HGBA1C 11.2* 07/06/2012   HGBA1C 7.4* 12/29/2011  He believes his HbA1c increased from 7.4 to 11.2% by not following his diet.  Pt is on a regimen of: - Metformin 1000 mg po bid - Glipizide 10 mg daily - NovoLog 70/30 (advised to take 100 units in am and 100 units in pm) >> 80 units 2x a day - vial - pays >100$ per 3 months.  Pt checks his sugars once a week a day and they are: - am: n/c - 2h after b'fast: n/c - before lunch: n/c - 2h after lunch: n/c - before dinner: 140-200s (mostly in 140s) - 2h after dinner: n/c - bedtime: n/c Has had lows  - last one in 11/2012 >> 63 (am). Lowest sugar was 63; he has hypoglycemia awareness at 70.  Highest sugar was 200s.  Pt's meals are: - Breakfast: egg bisquit; grits; oatmeal; occasionally a pancake + syrup (regular sugar) - Lunch: salad + bag of chips sometimes; soup; diet soda - Dinner: meat + veggies + starch - Snacks: peanuts  - no CKD, last BUN/creatinine:  Lab Results  Component Value Date   BUN 17 07/06/2012   CREATININE 1.1 07/06/2012  Pt is on Lisinopril 20 - last set of lipids: Lab Results  Component Value Date   CHOL 198 07/06/2012   HDL 45.20 07/06/2012   LDLCALC 123* 07/06/2012   LDLDIRECT 139.3 06/26/2011   TRIG 151.0* 07/06/2012   CHOLHDL 4 07/06/2012  Pt is on Lipitor 80. - last eye exam was in 12/2012. No DR. + mild glaucoma. - + numbness and tingling in his feet.  I reviewed his chart and he also has a history of HTN, HL, GERD.  Pt has FH of DM in brother, mother.    ROS: Constitutional: no weight gain/loss, no fatigue, no subjective hyperthermia/hypothermia Eyes: no blurry vision, no xerophthalmia ENT: no sore throat, no nodules palpated in throat, no dysphagia/odynophagia, no hoarseness Cardiovascular: no CP/SOB/palpitations/+ leg swelling Respiratory: no cough/SOB Gastrointestinal: no N/V/D/C Musculoskeletal: no muscle/joint aches Skin: no rashes Neurological: no tremors/numbness/tingling/dizziness Psychiatric: no depression/anxiety Low libido  Past Medical History  Diagnosis Date  . History of colonic polyps     Hyperplastic  . Diabetes mellitus type II 2002    Hospitalized for very high sugars  . Diverticulosis of colon   . GERD (gastroesophageal reflux disease)   . Hypertension   . Phimosis 2003    Repair  . Venous insufficiency    Past Surgical History  Procedure Laterality Date  . Hydrocele excision / repair  10/07    Lonna Cobb)   History   Social History  . Marital Status: Married    Spouse Name: N/A    Number of Children: 3  . 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 Smoking status: Former Smoker -- 37 years    Types: Cigarettes    Quit date: 02/24/1998  . Smokeless tobacco: Never Used  . Alcohol Use: No  . Drug  Use: No  . Sexual Activity: Not on file   Other Topics Concern  . Not on file   Social History Narrative  . No narrative on file   Current Outpatient Prescriptions on File Prior to Visit  Medication Sig Dispense Refill  . aspirin 81 MG tablet Take 81 mg by mouth daily.        Marland Kitchen atorvastatin (LIPITOR) 80 MG tablet TAKE ONE TABLET BY MOUTH EVERY DAY  90 tablet  3  . B-D INS SYR MICROFINE 1CC/28G 28G X 1/2" 1 ML MISC INJECT 100 UNITS SQ AS DIRECTED TWICE DAILY  200 each  0  . glipiZIDE (GLUCOTROL) 10 MG tablet TAKE ONE TABLET BY MOUTH EVERY DAY  90 tablet  3  . lisinopril-hydrochlorothiazide (PRINZIDE,ZESTORETIC) 20-25 MG per tablet  TAKE ONE TABLET BY MOUTH EVERY DAY  90 tablet  0  . metFORMIN (GLUCOPHAGE) 1000 MG tablet TAKE ONE TABLET BY MOUTH TWICE DAILY WITH MEALS  180 tablet  0   No current facility-administered medications on file prior to visit.   No Known Allergies Family History  Problem Relation Age of Onset  . Diabetes Mother   . Hypertension Mother   . Diabetes Father   . Mental illness Brother     Hx of schizophrenia  . Diabetes Brother   . Hypertension Brother    PE: BP 130/80  Pulse 93  Temp(Src) 98.5 F (36.9 C) (Oral)  Ht 5\' 11"  (1.803 m)  Wt 273 lb (123.832 kg)  BMI 38.09 kg/m2  SpO2 94% Wt Readings from Last 3 Encounters:  01/31/13 273 lb (123.832 kg)  01/07/13 273 lb (123.832 kg)  07/06/12 278 lb (126.1 kg)   Constitutional: overweight, in NAD Eyes: PERRLA, EOMI, no exophthalmos ENT: moist mucous membranes, no thyromegaly, no cervical lymphadenopathy Cardiovascular: RRR, No MRG Respiratory: CTA B Gastrointestinal: abdomen soft, NT, ND, BS+ Musculoskeletal: no deformities, strength intact in all 4 Skin: moist, warm, no rashes Neurological: no tremor with outstretched hands, DTR normal in all 4  ASSESSMENT: 1. DM2, insulin-dependent, uncontrolled, with complications - PN - venous insufficiency  PLAN:  1. Patient with long-standing, uncontrolled diabetes, on oral + premixed insulin antidiabetic regimen, which became insufficient. - I explained that the premixed insulin treatment is not ideal, since it's very inflexible: Inflexibility to adjust only the basal or the bolus doses and later peaking of the insulins. I explained that the ideal insulin regimen would contain ~ equal total daily doses of basal or bolus insulins. For now, we decided to stay with the 70/30 and optimize dosing, however, we might switch from this in the future to a more adjustable basal-bolus regimen. - We discussed about options for treatment, and I suggested to:  Patient Instructions  Please return in 1  month with your sugar log.  Stop Glipizide. Continue Metformin. Please take the 70/30 insulin 15 min before all 3 meals as follows: - breakfast: 60 units - lunch: 30 units - dinner: 70 units Please call me with sugars <80 or >200 consistently. When injecting insulin:  Inject in the abdomen  Rotate the injection sites around the belly button  Change needle for each injection  Keep needle in for 10 sec after last unit of insulin in  You can keep the insulin in use out of the fridge. - we discussed about improving his diet, with specific examples - Strongly advised him to start checking sugars at different times of the day - check 3 times a day, rotating checks - given  sugar log and advised how to fill it and to bring it at next appt  - given foot care handout and explained the principles  - given instructions for hypoglycemia management "15-15 rule"  - he had the flu vaccine this season - advised for yearly eye exams - he is up to date - Return to clinic in 1 mo with sugar log

## 2013-01-31 NOTE — Patient Instructions (Signed)
Please return in 1 month with your sugar log.  Stop Glipizide. Continue Metformin. Please take the 70/30 insulin 15 min before all 3 meals as follows: - breakfast: 60 units - lunch: 30 units - dinner: 70 units Please call me with sugars <80 or >200 consistently. When injecting insulin:  Inject in the abdomen  Rotate the injection sites around the belly button  Change needle for each injection  Keep needle in for 10 sec after last unit of insulin in  You can keep the insulin in use out of the fridge.   PATIENT INSTRUCTIONS FOR TYPE 2 DIABETES:  **Please join MyChart!** - see attached instructions about how to join   DIET AND EXERCISE Diet and exercise is an important part of diabetic treatment.  We recommended aerobic exercise in the form of brisk walking (working between 40-60% of maximal aerobic capacity, similar to brisk walking) for 150 minutes per week (such as 30 minutes five days per week) along with 3 times per week performing 'resistance' training (using various gauge rubber tubes with handles) 5-10 exercises involving the major muscle groups (upper body, lower body and core) performing 10-15 repetitions (or near fatigue) each exercise. Start at half the above goal but build slowly to reach the above goals. If limited by weight, joint pain, or disability, we recommend daily walking in a swimming pool with water up to waist to reduce pressure from joints while allow for adequate exercise.    BLOOD GLUCOSES Monitoring your blood glucoses is important for continued management of your diabetes. Please check your blood glucoses 2-4 times a day: fasting, before meals and at bedtime (you can rotate these measurements - e.g. one day check before the 3 meals, the next day check before 2 of the meals and before bedtime, etc.   HYPOGLYCEMIA (low blood sugar) Hypoglycemia is usually a reaction to not eating, exercising, or taking too much insulin/ other diabetes drugs.  Symptoms include  tremors, sweating, hunger, confusion, headache, etc. Treat IMMEDIATELY with 15 grams of Carbs:   4 glucose tablets    cup regular juice/soda   2 tablespoons raisins   4 teaspoons sugar   1 tablespoon honey Recheck blood glucose in 15 mins and repeat above if still symptomatic/blood glucose <100. Please contact our office at [phone] if you have questions about how to next handle your insulin.  RECOMMENDATIONS TO REDUCE YOUR RISK OF DIABETIC COMPLICATIONS: * Take your prescribed MEDICATION(S). * Follow a DIABETIC diet: Complex carbs, fiber rich foods, heart healthy fish twice weekly, (monounsaturated and polyunsaturated) fats * AVOID saturated/trans fats, high fat foods, >2,300 mg salt per day. * EXERCISE at least 5 times a week for 30 minutes or preferably daily.  * DO NOT SMOKE OR DRINK more than 1 drink a day. * Check your FEET every day. Do not wear tightfitting shoes. Contact us if you develop an ulcer * See your EYE doctor once a year or more if needed * Get a FLU shot once a year * Get a PNEUMONIA vaccine once before and once after age 64 years  GOALS:  * Your Hemoglobin A1c of <7%  * fasting sugars need to be <130 * after meals sugars need to be <180 (2h after you start eating) * Your Systolic BP should be 140 or lower  * Your Diastolic BP should be 80 or lower  * Your HDL (Good Cholesterol) should be 40 or higher  * Your LDL (Bad Cholesterol) should be 100 or lower  *  Your Triglycerides should be 150 or lower  * Your Urine microalbumin (kidney function) should be <30 * Your Body Mass Index should be 25 or lower   We will be glad to help you achieve these goals. Our telephone number is: [phone].

## 2013-03-06 ENCOUNTER — Other Ambulatory Visit: Payer: Self-pay | Admitting: Internal Medicine

## 2013-03-09 ENCOUNTER — Other Ambulatory Visit: Payer: Self-pay | Admitting: Internal Medicine

## 2013-03-22 ENCOUNTER — Encounter: Payer: Self-pay | Admitting: Internal Medicine

## 2013-03-22 ENCOUNTER — Ambulatory Visit (INDEPENDENT_AMBULATORY_CARE_PROVIDER_SITE_OTHER): Payer: BC Managed Care – PPO | Admitting: Internal Medicine

## 2013-03-22 ENCOUNTER — Encounter: Payer: Self-pay | Admitting: *Deleted

## 2013-03-22 VITALS — BP 120/80 | HR 74 | Temp 98.4°F | Wt 268.0 lb

## 2013-03-22 DIAGNOSIS — R109 Unspecified abdominal pain: Secondary | ICD-10-CM

## 2013-03-22 LAB — POCT URINALYSIS DIPSTICK
Bilirubin, UA: NEGATIVE
Blood, UA: 2
GLUCOSE UA: NEGATIVE
Ketones, UA: NEGATIVE
Leukocytes, UA: NEGATIVE
NITRITE UA: NEGATIVE
SPEC GRAV UA: 1.02
Urobilinogen, UA: 0.2
pH, UA: 6

## 2013-03-22 MED ORDER — TRAMADOL HCL 50 MG PO TABS: 50.0000 mg | ORAL_TABLET | Freq: Three times a day (TID) | ORAL | Status: DC | PRN

## 2013-03-22 NOTE — Progress Notes (Signed)
Subjective:    Patient ID: Adam [name], male    DOB: 1950/09/25, 63 y.o.   MRN: [id number]  HPI Having bad pain along posterior left flank Started bad ~5 days ago Worsened over weekend and couldn't go to church Eased up some and able to go to work Got worse last night  Not much appetite Sore when he lies on it Dull pain No radiation to groin Starts on flank and radiates around to back No apparent rash Some nausea. Vomited once last night after eating No fever  Tried tylenol--no obvious help  Current Outpatient Prescriptions on File Prior to Visit  Medication Sig Dispense Refill  . aspirin 81 MG tablet Take 81 mg by mouth daily.        Marland Kitchen atorvastatin (LIPITOR) 80 MG tablet TAKE ONE TABLET BY MOUTH EVERY DAY  90 tablet  3  . B-D INS SYR MICROFINE 1CC/28G 28G X 1/2" 1 ML MISC INJECT 100 UNITS SQ AS DIRECTED TWICE DAILY  200 each  0  . glipiZIDE (GLUCOTROL) 10 MG tablet TAKE ONE TABLET BY MOUTH EVERY DAY  90 tablet  3  . lisinopril-hydrochlorothiazide (PRINZIDE,ZESTORETIC) 20-25 MG per tablet TAKE ONE TABLET BY MOUTH ONCE DAILY  90 tablet  0  . metFORMIN (GLUCOPHAGE) 1000 MG tablet TAKE ONE TABLET BY MOUTH TWICE DAILY WITH MEALS  180 tablet  0  . NOVOLOG MIX 70/30 (70-30) 100 UNIT/ML injection INJECT 100 UNITS UNDER THE SKIN TWICE A DAY WITH MEALS  20 vial  2   No current facility-administered medications on file prior to visit.    No Known Allergies  Past Medical History  Diagnosis Date  . History of colonic polyps     Hyperplastic  . Diabetes mellitus type II 2002    Hospitalized for very high sugars  . Diverticulosis of colon   . GERD (gastroesophageal reflux disease)   . Hypertension   . Phimosis 2003    Repair  . Venous insufficiency     Past Surgical History  Procedure Laterality Date  . Hydrocele excision / repair  10/07    [name])    Family History  Problem Relation Age of Onset  . Diabetes Mother   . Hypertension Mother   . Diabetes Father     . Mental illness Brother     Hx of schizophrenia  . Diabetes Brother   . Hypertension Brother     History   Social History  . Marital Status: Married    Spouse Name: N/A    Number of Children: 3  . 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 Smoking status: Former Smoker -- 37 years    Types: Cigarettes    Quit date: 02/24/1998  . Smokeless tobacco: Never Used  . Alcohol Use: No  . Drug Use: No  . Sexual Activity: Not on file   Other Topics Concern  . Not on file   Social History Narrative  . No narrative on file   Review of Systems No sleeping well due to the pain No problems with bowels No hematuria, no history of kidney stones     Objective:   Physical Exam  Constitutional: He appears well-developed and well-nourished. No distress.  Neck: Normal range of motion. Neck supple. No thyromegaly present.  Cardiovascular: Normal rate, regular rhythm and normal heart sounds.  Exam reveals no gallop.   No murmur heard. Pulmonary/Chest: Effort  normal and breath sounds normal. No respiratory distress. He has no wheezes. He has no rales.  Abdominal: Soft. He exhibits no distension and no mass. There is no tenderness. There is no rebound and no guarding.  Musculoskeletal:  No CVA tenderness Localized tenderness laterally ~L3-4 on left  Lymphadenopathy:    He has no cervical adenopathy.  Psychiatric: He has a normal mood and affect. His behavior is normal.          Assessment & Plan:

## 2013-03-22 NOTE — Progress Notes (Signed)
Pre-visit discussion using our clinic review tool. No additional management support is needed unless otherwise documented below in the visit note.  

## 2013-03-22 NOTE — Assessment & Plan Note (Addendum)
Urinalysis had positive dip for blood but no RBCs under the microscope No abdominal tenderness despite the nausea and decreased appetite --- I don't think this is GI either Most likely some muscular strain  Discussed heat Will give tramadol for pain

## 2013-03-22 NOTE — Patient Instructions (Signed)
Please try heat for the painful area. You can use up to 2 of the pain pills at night

## 2013-03-27 ENCOUNTER — Emergency Department: Payer: Self-pay | Admitting: Internal Medicine

## 2013-04-01 ENCOUNTER — Encounter: Payer: Self-pay | Admitting: Family Medicine

## 2013-04-01 ENCOUNTER — Ambulatory Visit (INDEPENDENT_AMBULATORY_CARE_PROVIDER_SITE_OTHER): Payer: BC Managed Care – PPO | Admitting: Family Medicine

## 2013-04-01 VITALS — BP 140/80 | HR 85 | Temp 98.1°F | Ht 71.0 in | Wt 277.5 lb

## 2013-04-01 DIAGNOSIS — B029 Zoster without complications: Secondary | ICD-10-CM | POA: Insufficient documentation

## 2013-04-01 MED ORDER — TRAMADOL HCL 50 MG PO TABS: 50.0000 mg | ORAL_TABLET | Freq: Three times a day (TID) | ORAL | Status: DC | PRN

## 2013-04-01 NOTE — Patient Instructions (Signed)
Finish your valacyclovir for shingles Use the tramadol with caution (sedation) for pain  Keep the rash clean  Let Dr Silvio Pate know how pain is in the next 2 weeks or so  You may want to consider a shingles vaccine when you are better If you are interested in a shingles/zoster vaccine - call your insurance to check on coverage,( you should not get it within 1 month of other vaccines) , then call us for a prescription  for it to take to a pharmacy that gives the shot , or make a nurse visit to get it here depending on your coverage     Shingles Shingles (herpes zoster) is an infection that is caused by the same virus that causes chickenpox (varicella). The infection causes a painful skin rash and fluid-filled blisters, which eventually break open, crust over, and heal. It may occur in any area of the body, but it usually affects only one side of the body or face. The pain of shingles usually lasts about 1 month. However, some people with shingles may develop long-term (chronic) pain in the affected area of the body. Shingles often occurs many years after the person had chickenpox. It is more common:  In people older than 50 years.  In people with weakened immune systems, such as those with HIV, AIDS, or cancer.  In people taking medicines that weaken the immune system, such as transplant medicines.  In people under great stress. CAUSES  Shingles is caused by the varicella zoster virus (VZV), which also causes chickenpox. After a person is infected with the virus, it can remain in the person's body for years in an inactive state (dormant). To cause shingles, the virus reactivates and breaks out as an infection in a nerve root. The virus can be spread from person to person (contagious) through contact with open blisters of the shingles rash. It will only spread to people who have not had chickenpox. When these people are exposed to the virus, they may develop chickenpox. They will not develop  shingles. Once the blisters scab over, the person is no longer contagious and cannot spread the virus to others. SYMPTOMS  Shingles shows up in stages. The initial symptoms may be pain, itching, and tingling in an area of the skin. This pain is usually described as burning, stabbing, or throbbing.In a few days or weeks, a painful red rash will appear in the area where the pain, itching, and tingling were felt. The rash is usually on one side of the body in a band or belt-like pattern. Then, the rash usually turns into fluid-filled blisters. They will scab over and dry up in approximately 2 3 weeks. Flu-like symptoms may also occur with the initial symptoms, the rash, or the blisters. These may include:  Fever.  Chills.  Headache.  Upset stomach. DIAGNOSIS  Your caregiver will perform a skin exam to diagnose shingles. Skin scrapings or fluid samples may also be taken from the blisters. This sample will be examined under a microscope or sent to a lab for further testing. TREATMENT  There is no specific cure for shingles. Your caregiver will likely prescribe medicines to help you manage the pain, recover faster, and avoid long-term problems. This may include antiviral drugs, anti-inflammatory drugs, and pain medicines. HOME CARE INSTRUCTIONS   Take a cool bath or apply cool compresses to the area of the rash or blisters as directed. This may help with the pain and itching.   Only take over-the-counter or prescription medicines  as directed by your caregiver.   Rest as directed by your caregiver.  Keep your rash and blisters clean with mild soap and cool water or as directed by your caregiver.  Do not pick your blisters or scratch your rash. Apply an anti-itch cream or numbing creams to the affected area as directed by your caregiver.  Keep your shingles rash covered with a loose bandage (dressing).  Avoid skin contact with:  Babies.   Pregnant women.   Children with eczema.    Elderly people with transplants.   People with chronic illnesses, such as leukemia or AIDS.   Wear loose-fitting clothing to help ease the pain of material rubbing against the rash.  Keep all follow-up appointments with your caregiver.If the area involved is on your face, you may receive a referral for follow-up to a specialist, such as an eye doctor (ophthalmologist) or an ear, nose, and throat (ENT) doctor. Keeping all follow-up appointments will help you avoid eye complications, chronic pain, or disability.  SEEK IMMEDIATE MEDICAL CARE IF:   You have facial pain, pain around the eye area, or loss of feeling on one side of your face.  You have ear pain or ringing in your ear.  You have loss of taste.  Your pain is not relieved with prescribed medicines.   Your redness or swelling spreads.   You have more pain and swelling.  Your condition is worsening or has changed.   You have a feveror persistent symptoms for more than 2 3 days.  You have a fever and your symptoms suddenly get worse. MAKE SURE YOU:  Understand these instructions.  Will watch your condition.  Will get help right away if you are not doing well or get worse. Document Released: 02/10/2005 Document Revised: 11/05/2011 Document Reviewed: 09/25/2011 [facility] Patient Information 2014 [place].

## 2013-04-01 NOTE — Progress Notes (Signed)
Pre-visit discussion using our clinic review tool. No additional management support is needed unless otherwise documented below in the visit note.  

## 2013-04-01 NOTE — Progress Notes (Signed)
Subjective:    Patient ID: Adam [name], male    DOB: [birth year], 63 y.o.   MRN: [id number]  HPI Here for f/u of shingles  Went to [facility]  2/1 - after having back pain for a while    (prior to that just pain w/o rash and saw pcp)  By that weekend pain in L lower back was severe   Gave him demerol and phenergan injections in the ER Rev ER report today  gave a course of valtrex - tolerated well  And tramadol - given 20 pills (out of them but they helped )   No otc med except occ tylenol   Patient Active Problem List   Diagnosis Date Noted  . Left flank pain 03/22/2013  . Diabetes, polyneuropathy 01/07/2013  . Chronic venous insufficiency 01/07/2013  . Type II or unspecified type diabetes mellitus with neurological manifestations, uncontrolled(250.62) 06/26/2011  . Routine general medical examination at a health care facility 06/17/2010  . HYPERLIPIDEMIA 06/09/2006  . HYPERTENSION 06/09/2006  . GERD 06/09/2006  . DIVERTICULOSIS, COLON 06/09/2006  . COLONIC POLYPS, HX OF 06/09/2006   Past Medical History  Diagnosis Date  . History of colonic polyps     Hyperplastic  . Diabetes mellitus type II 2002    Hospitalized for very high sugars  . Diverticulosis of colon   . GERD (gastroesophageal reflux disease)   . Hypertension   . Phimosis 2003    Repair  . Venous insufficiency    Past Surgical History  Procedure Laterality Date  . Hydrocele excision / repair  10/07    [name])   History  Substance Use Topics  . Smoking status: Former Smoker -- 37 years    Types: Cigarettes    Quit date: 02/24/1998  . Smokeless tobacco: Never Used  . Alcohol Use: No   Family History  Problem Relation Age of Onset  . Diabetes Mother   . Hypertension Mother   . Diabetes Father   . Mental illness Brother     Hx of schizophrenia  . Diabetes Brother   . Hypertension Brother    No Known Allergies Current Outpatient Prescriptions on File Prior to Visit  Medication Sig Dispense  Refill  . aspirin 81 MG tablet Take 81 mg by mouth daily.        Marland Kitchen atorvastatin (LIPITOR) 80 MG tablet TAKE ONE TABLET BY MOUTH EVERY DAY  90 tablet  3  . B-D INS SYR MICROFINE 1CC/28G 28G X 1/2" 1 ML MISC INJECT 100 UNITS SQ AS DIRECTED TWICE DAILY  200 each  0  . glipiZIDE (GLUCOTROL) 10 MG tablet TAKE ONE TABLET BY MOUTH EVERY DAY  90 tablet  3  . lisinopril-hydrochlorothiazide (PRINZIDE,ZESTORETIC) 20-25 MG per tablet TAKE ONE TABLET BY MOUTH ONCE DAILY  90 tablet  0  . metFORMIN (GLUCOPHAGE) 1000 MG tablet TAKE ONE TABLET BY MOUTH TWICE DAILY WITH MEALS  180 tablet  0  . NOVOLOG MIX 70/30 (70-30) 100 UNIT/ML injection INJECT 100 UNITS UNDER THE SKIN TWICE A DAY WITH MEALS  20 vial  2  . traMADol (ULTRAM) 50 MG tablet Take 1 tablet (50 mg total) by mouth 3 (three) times daily as needed.  40 tablet  0   No current facility-administered medications on file prior to visit.      Review of Systems Review of Systems  Constitutional: Negative for fever, appetite change, fatigue and unexpected weight change.  Eyes: Negative for pain and visual disturbance.  Respiratory: Negative for  cough and shortness of breath.   Cardiovascular: Negative for cp or palpitations    Gastrointestinal: Negative for nausea, diarrhea and constipation.  Genitourinary: Negative for urgency and frequency.  Skin: Negative for pallor and pos for rash that is painful on lower back  Neurological: Negative for weakness, light-headedness, numbness and headaches.  Hematological: Negative for adenopathy. Does not bruise/bleed easily.  Psychiatric/Behavioral: Negative for dysphoric mood. The patient is not nervous/anxious.         Objective:   Physical Exam  Constitutional: He appears well-developed and well-nourished. No distress.  HENT:  Head: Normocephalic and atraumatic.  Mouth/Throat: Oropharynx is clear and moist.  Eyes: Conjunctivae and EOM are normal. Pupils are equal, round, and reactive to light. No scleral  icterus.  Neck: Normal range of motion. Neck supple. No JVD present.  Cardiovascular: Normal rate and regular rhythm.   Pulmonary/Chest: Effort normal and breath sounds normal. No respiratory distress. He has no wheezes. He has no rales.  Musculoskeletal: He exhibits no edema.  Lymphadenopathy:    He has no cervical adenopathy.  Neurological: He is alert. He has normal reflexes.  Skin: Skin is warm and dry. Rash noted.  L low back - small area of dried vesicles that are healing and sensitive No redness or drainage   Psychiatric: He has a normal mood and affect.          Assessment & Plan:

## 2013-04-03 ENCOUNTER — Other Ambulatory Visit: Payer: Self-pay | Admitting: Internal Medicine

## 2013-04-03 NOTE — Assessment & Plan Note (Signed)
Pt has symptom improvement thus far with valtrex and tramadol Tramadol px today -with warning of sedation potential /habit  Adv to keep area clean and dry  Update PCP if no further imp  Also consider zostavax when no longer symptomatic if affordable

## 2013-05-13 ENCOUNTER — Encounter: Payer: Self-pay | Admitting: Gastroenterology

## 2013-05-17 ENCOUNTER — Encounter: Payer: Self-pay | Admitting: Gastroenterology

## 2013-06-30 ENCOUNTER — Telehealth: Payer: Self-pay | Admitting: *Deleted

## 2013-06-30 ENCOUNTER — Ambulatory Visit (AMBULATORY_SURGERY_CENTER): Payer: BC Managed Care – PPO | Admitting: *Deleted

## 2013-06-30 VITALS — Ht 72.0 in | Wt 273.8 lb

## 2013-06-30 DIAGNOSIS — Z860100 Personal history of colon polyps, unspecified: Secondary | ICD-10-CM

## 2013-06-30 MED ORDER — NA SULFATE-K SULFATE-MG SULF 17.5-3.13-1.6 GM/177ML PO SOLN: 1.0000 | Freq: Once | ORAL | Status: DC

## 2013-06-30 NOTE — Telephone Encounter (Signed)
Pt's wife notified that recall colon not due until 2017.  Colonoscopy for 5/21 cancelled.  Recall for 2017 entered into EPIC.

## 2013-06-30 NOTE — Telephone Encounter (Signed)
Dr Deatra Ina: pt is scheduled for recall colonoscopy 07/14/13.  Last colonoscopy with you 2007.  Pt had hyperplastic polyps.   No family hx colon cancer; pt is not having any GI problems currently.  Is he due for recall colon now or 2017?  Thanks, Juliann Pulse   FINAL DIAGNOSIS  MICROSCOPIC EXAMINATION AND DIAGNOSIS  1. COLON, TRANSVERSE POLYP: HYPERPLASTIC POLYP. NO ADENOMATOUS CHANGE OR MALIGNANCY IDENTIFIED.  2. COLON, RECTAL POLYPS: TWO HYPERPLASTIC POLYPS. NO ADENOMATOUS CHANGE OR MALIGNANCY IDENTIFIED.  cf Date Reported: 07/14/2005 Chrystie Nose. Saralyn Pilar, MD Electronically Signed Out By JDP   Clinical information R/O adenoma hx of polyps (gt)  specimen(s) obtained 1: Colon, polyp(s), transverse 2: Rectum, polyp(s)

## 2013-06-30 NOTE — Telephone Encounter (Signed)
2017

## 2013-06-30 NOTE — Progress Notes (Signed)
No allergies to eggs or soy. No problems with anesthesia.  Pt given Emmi instructions for colonoscopy  No oxygen use  No diet drug use  

## 2013-07-04 ENCOUNTER — Other Ambulatory Visit: Payer: Self-pay | Admitting: Internal Medicine

## 2013-07-08 ENCOUNTER — Ambulatory Visit (INDEPENDENT_AMBULATORY_CARE_PROVIDER_SITE_OTHER): Payer: BC Managed Care – PPO | Admitting: Internal Medicine

## 2013-07-08 ENCOUNTER — Encounter: Payer: Self-pay | Admitting: Internal Medicine

## 2013-07-08 VITALS — BP 140/80 | HR 61 | Temp 98.2°F | Ht 71.0 in | Wt 274.0 lb

## 2013-07-08 DIAGNOSIS — Z0000 Encounter for general adult medical examination without abnormal findings: Secondary | ICD-10-CM

## 2013-07-08 DIAGNOSIS — E1142 Type 2 diabetes mellitus with diabetic polyneuropathy: Secondary | ICD-10-CM

## 2013-07-08 DIAGNOSIS — I10 Essential (primary) hypertension: Secondary | ICD-10-CM

## 2013-07-08 DIAGNOSIS — E1149 Type 2 diabetes mellitus with other diabetic neurological complication: Secondary | ICD-10-CM

## 2013-07-08 DIAGNOSIS — E785 Hyperlipidemia, unspecified: Secondary | ICD-10-CM

## 2013-07-08 LAB — COMPREHENSIVE METABOLIC PANEL
ALBUMIN: 3.3 g/dL — AB (ref 3.5–5.2)
ALT: 32 U/L (ref 0–53)
AST: 30 U/L (ref 0–37)
Alkaline Phosphatase: 50 U/L (ref 39–117)
BUN: 18 mg/dL (ref 6–23)
CALCIUM: 9.3 mg/dL (ref 8.4–10.5)
CHLORIDE: 105 meq/L (ref 96–112)
CO2: 28 meq/L (ref 19–32)
Creatinine, Ser: 1.2 mg/dL (ref 0.4–1.5)
GFR: 79.8 mL/min (ref >60.00)
Glucose, Bld: 88 mg/dL (ref 70–99)
Potassium: 3.8 mEq/L (ref 3.5–5.1)
Sodium: 139 mEq/L (ref 135–145)
Total Bilirubin: 0.5 mg/dL (ref 0.2–1.2)
Total Protein: 7.4 g/dL (ref 6.0–8.3)

## 2013-07-08 LAB — LIPID PANEL
Cholesterol: 251 mg/dL — ABNORMAL HIGH (ref 0–200)
HDL: 43.3 mg/dL (ref >39.00)
LDL Cholesterol: 167 mg/dL — ABNORMAL HIGH (ref 0–99)
Total CHOL/HDL Ratio: 6
Triglycerides: 202 mg/dL — ABNORMAL HIGH (ref 0.0–149.0)
VLDL: 40.4 mg/dL — AB (ref 0.0–40.0)

## 2013-07-08 LAB — CBC WITH DIFFERENTIAL/PLATELET
BASOS PCT: 0.7 % (ref 0.0–3.0)
Basophils Absolute: 0.1 10*3/uL (ref 0.0–0.1)
EOS PCT: 4 % (ref 0.0–5.0)
Eosinophils Absolute: 0.3 10*3/uL (ref 0.0–0.7)
HCT: 40.3 % (ref 39.0–52.0)
Hemoglobin: 13.2 g/dL (ref 13.0–17.0)
LYMPHS PCT: 54.6 % — AB (ref 12.0–46.0)
Lymphs Abs: 4 10*3/uL (ref 0.7–4.0)
MCHC: 32.8 g/dL (ref 30.0–36.0)
MCV: 88.4 fl (ref 78.0–100.0)
Monocytes Absolute: 0.4 10*3/uL (ref 0.1–1.0)
Monocytes Relative: 5.1 % (ref 3.0–12.0)
NEUTROS PCT: 35.6 % — AB (ref 43.0–77.0)
Neutro Abs: 2.6 10*3/uL (ref 1.4–7.7)
PLATELETS: 248 10*3/uL (ref 150.0–400.0)
RBC: 4.56 Mil/uL (ref 4.22–5.81)
RDW: 14.4 % (ref 11.5–15.5)
WBC: 7.3 10*3/uL (ref 4.0–10.5)

## 2013-07-08 LAB — HEMOGLOBIN A1C: HEMOGLOBIN A1C: 11.8 % — AB (ref 4.6–6.5)

## 2013-07-08 LAB — HM DIABETES FOOT EXAM

## 2013-07-08 LAB — TSH: TSH: 0.42 u[IU]/mL (ref 0.35–4.50)

## 2013-07-08 LAB — T4, FREE: FREE T4: 0.77 ng/dL (ref 0.60–1.60)

## 2013-07-08 NOTE — Assessment & Plan Note (Signed)
Mild No meds needed 

## 2013-07-08 NOTE — Progress Notes (Signed)
Subjective:    Patient ID: Adam [name], male    DOB: 1950-10-26, 63 y.o.   MRN: [id number]  HPI Here for physical  Did have 1 visit with Dr Cruzita Lederer Copayment is too much for him Checks sugars 3 times per week Usually ~140 No hypoglycemic reactions since taking glipizide later (after lunch) Still with foot numbness but no sores or pain  No chest pain Trying to walk regularly Looking into joining the [facility]  Current Outpatient Prescriptions on File Prior to Visit  Medication Sig Dispense Refill  . aspirin 81 MG tablet Take 81 mg by mouth daily.        Marland Kitchen atorvastatin (LIPITOR) 80 MG tablet TAKE ONE TABLET BY MOUTH EVERY DAY  90 tablet  3  . B-D INS SYR MICROFINE 1CC/28G 28G X 1/2" 1 ML MISC INJECT 100 UNITS SQ AS DIRECTED TWICE DAILY  200 each  0  . glipiZIDE (GLUCOTROL) 10 MG tablet TAKE ONE TABLET BY MOUTH EVERY DAY  90 tablet  3  . lisinopril-hydrochlorothiazide (PRINZIDE,ZESTORETIC) 20-25 MG per tablet TAKE ONE TABLET BY MOUTH ONCE DAILY  90 tablet  0  . metFORMIN (GLUCOPHAGE) 1000 MG tablet TAKE ONE TABLET BY MOUTH TWICE DAILY WITH  MEALS  180 tablet  0  . NOVOLOG MIX 70/30 (70-30) 100 UNIT/ML injection INJECT 100 UNITS UNDER THE SKIN TWICE A DAY WITH MEALS  20 vial  2  . traMADol (ULTRAM) 50 MG tablet Take 1 tablet (50 mg total) by mouth 3 (three) times daily as needed for moderate pain.  45 tablet  0  . valACYclovir (VALTREX) 1000 MG tablet Take 1,000 mg by mouth 2 (two) times daily. 1 tab by mouth three times a day for 10 days       No current facility-administered medications on file prior to visit.    No Known Allergies  Past Medical History  Diagnosis Date  . History of colonic polyps     Hyperplastic  . Diabetes mellitus type II 2002    Hospitalized for very high sugars  . Diverticulosis of colon   . GERD (gastroesophageal reflux disease)   . Hypertension   . Phimosis 2003    Repair  . Venous insufficiency   . Hyperlipidemia   . Cataract     Past  Surgical History  Procedure Laterality Date  . Hydrocele excision / repair  10/07    [name])    Family History  Problem Relation Age of Onset  . Diabetes Mother   . Hypertension Mother   . Diabetes Father   . Mental illness Brother     Hx of schizophrenia  . Diabetes Brother   . Hypertension Brother   . Colon cancer Neg Hx     History   Social History  . Marital Status: Married    Spouse Name: N/A    Number of Children: 3  . Years of Education: N/A   Occupational History  . 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 Smoking status: Former Smoker -- 37 years    Types: Cigarettes    Quit date: 02/24/1998  . Smokeless tobacco: Never Used  . Alcohol Use: No  . Drug Use: No  . Sexual Activity: Not on file   Other Topics Concern  . Not on file   Social History Narrative  . No narrative on file   Review of Systems  Constitutional: Negative for fatigue and unexpected weight change.  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        Regular with dentist  Eyes: Negative for visual disturbance.       Keeps up with eye doctor  Respiratory: Negative for cough, chest tightness and shortness of breath.   Cardiovascular: Negative for chest pain, palpitations and leg swelling.  Gastrointestinal: Negative for nausea, vomiting, abdominal pain, constipation and blood in stool.       No heartburn Getting repeat colonoscopy -- now deferred till 2017  Endocrine: Negative for cold intolerance and heat intolerance.  Genitourinary: Positive for urgency. Negative for difficulty urinating.       Rare urgency No sex--no problem  Musculoskeletal: Negative for arthralgias, back pain and joint swelling.  Skin: Negative for rash.       Shingles cleared  Allergic/Immunologic: Negative for immunocompromised state.       Mild sneezing from the pollen--uses saline nasal  spray at night  Neurological: Positive for weakness and numbness. Negative for dizziness, syncope, light-headedness and headaches.       Notices more leg weakness--needs arms to push up out of chair---discussed leg strengthening  Hematological: Negative for adenopathy. Does not bruise/bleed easily.  Psychiatric/Behavioral: Positive for dysphoric mood. Negative for sleep disturbance. The patient is not nervous/anxious.        Mild depression due to shingles (now better), wife being sick, job stress--- reads bible and feels better       Objective:   Physical Exam  Constitutional: He is oriented to person, place, and time. He appears well-developed and well-nourished. No distress.  HENT:  Head: Normocephalic and atraumatic.  Right Ear: External ear normal.  Left Ear: External ear normal.  Mouth/Throat: Oropharynx is clear and moist. No oropharyngeal exudate.  Eyes: Conjunctivae and EOM are normal. Pupils are equal, round, and reactive to light.  Neck: Normal range of motion. Neck supple. No thyromegaly present.  Cardiovascular: Normal rate, regular rhythm, normal heart sounds and intact distal pulses.  Exam reveals no gallop.   No murmur heard. Pulmonary/Chest: Effort normal and breath sounds normal. No respiratory distress. He has no wheezes. He has no rales.  Abdominal: Soft. There is no tenderness.  Musculoskeletal: He exhibits no tenderness.  Trace ankle edema  Lymphadenopathy:    He has no cervical adenopathy.  Neurological: He is alert and oriented to person, place, and time.  Slight decreased sensation in plantar feet  Skin: No rash noted. No erythema.  No foot ulcers  Psychiatric: He has a normal mood and affect. His behavior is normal.          Assessment & Plan:

## 2013-07-08 NOTE — Assessment & Plan Note (Signed)
On high dose statin.

## 2013-07-08 NOTE — Patient Instructions (Signed)
Exercise to Lose Weight Exercise and a healthy diet may help you lose weight. Your doctor may suggest specific exercises. EXERCISE IDEAS AND TIPS  Choose low-cost things you enjoy doing, such as walking, bicycling, or exercising to workout videos.  Take stairs instead of the elevator.  Walk during your lunch break.  Park your car further away from work or school.  Go to a gym or an exercise class.  Start with 5 to 10 minutes of exercise each day. Build up to 30 minutes of exercise 4 to 6 days a week.  Wear shoes with good support and comfortable clothes.  Stretch before and after working out.  Work out until you breathe harder and your heart beats faster.  Drink extra water when you exercise.  Do not do so much that you hurt yourself, feel dizzy, or get very short of breath. Exercises that burn about 150 calories:  Running 1  miles in 15 minutes.  Playing volleyball for 45 to 60 minutes.  Washing and waxing a car for 45 to 60 minutes.  Playing touch football for 45 minutes.  Walking 1  miles in 35 minutes.  Pushing a stroller 1  miles in 30 minutes.  Playing basketball for 30 minutes.  Raking leaves for 30 minutes.  Bicycling 5 miles in 30 minutes.  Walking 2 miles in 30 minutes.  Dancing for 30 minutes.  Shoveling snow for 15 minutes.  Swimming laps for 20 minutes.  Walking up stairs for 15 minutes.  Bicycling 4 miles in 15 minutes.  Gardening for 30 to 45 minutes.  Jumping rope for 15 minutes.  Washing windows or floors for 45 to 60 minutes. Document Released: 03/15/2010 Document Revised: 05/05/2011 Document Reviewed: 03/15/2010 [facility] Patient Information 2014 [place], [place]. Diabetes Meal Planning Guide The diabetes meal planning guide is a tool to help you plan your meals and snacks. It is important for people with diabetes to manage their blood glucose (sugar) levels. Choosing the right foods and the right amounts throughout your day will  help control your blood glucose. Eating right can even help you improve your blood pressure and reach or maintain a healthy weight. CARBOHYDRATE COUNTING MADE EASY When you eat carbohydrates, they turn to sugar. This raises your blood glucose level. Counting carbohydrates can help you control this level so you feel better. When you plan your meals by counting carbohydrates, you can have more flexibility in what you eat and balance your medicine with your food intake. Carbohydrate counting simply means adding up the total amount of carbohydrate grams in your meals and snacks. Try to eat about the same amount at each meal. Foods with carbohydrates are listed below. Each portion below is 1 carbohydrate serving or 15 grams of carbohydrates. Ask your dietician how many grams of carbohydrates you should eat at each meal or snack. Grains and Starches  1 slice bread.   English muffin or hotdog/hamburger bun.   cup cold cereal (unsweetened).   cup cooked pasta or rice.   cup starchy vegetables (corn, potatoes, peas, beans, winter squash).  1 tortilla (6 inches).   bagel.  1 waffle or pancake (size of a CD).   cup cooked cereal.  4 to 6 small crackers. *Whole grain is recommended. Fruit  1 cup fresh unsweetened berries, melon, papaya, pineapple.  1 small fresh fruit.   banana or mango.   cup fruit juice (4 oz unsweetened).   cup canned fruit in natural juice or water.  2 tbs dried fruit.  12 to 15 grapes or cherries. Milk and Yogurt  1 cup fat-free or 1% milk.  1 cup soy milk.  6 oz light yogurt with sugar-free sweetener.  6 oz low-fat soy yogurt.  6 oz plain yogurt. Vegetables  1 cup raw or  cup cooked is counted as 0 carbohydrates or a "free" food.  If you eat 3 or more servings at 1 meal, count them as 1 carbohydrate serving. Other Carbohydrates   oz chips or pretzels.   cup ice cream or frozen yogurt.   cup sherbet or sorbet.  2 inch square cake,  no frosting.  1 tbs honey, sugar, jam, jelly, or syrup.  2 small cookies.  3 squares of graham crackers.  3 cups popcorn.  6 crackers.  1 cup broth-based soup.  Count 1 cup casserole or other mixed foods as 2 carbohydrate servings.  Foods with less than 20 calories in a serving may be counted as 0 carbohydrates or a "free" food. You may want to purchase a book or computer software that lists the carbohydrate gram counts of different foods. In addition, the nutrition facts panel on the labels of the foods you eat are a good source of this information. The label will tell you how big the serving size is and the total number of carbohydrate grams you will be eating per serving. Divide this number by 15 to obtain the number of carbohydrate servings in a portion. Remember, 1 carbohydrate serving equals 15 grams of carbohydrate. SERVING SIZES Measuring foods and serving sizes helps you make sure you are getting the right amount of food. The list below tells how big or small some common serving sizes are.  1 oz.........4 stacked dice.  3 oz........Marland KitchenDeck of cards.  1 tsp.......Marland KitchenTip of little finger.  1 tbs......Marland KitchenMarland KitchenThumb.  2 tbs.......Marland KitchenGolf ball.   cup......Marland KitchenHalf of a fist.  1 cup.......Marland KitchenA fist. SAMPLE DIABETES MEAL PLAN Below is a sample meal plan that includes foods from the grain and starches, dairy, vegetable, fruit, and meat groups. A dietician can individualize a meal plan to fit your calorie needs and tell you the number of servings needed from each food group. However, controlling the total amount of carbohydrates in your meal or snack is more important than making sure you include all of the food groups at every meal. You may interchange carbohydrate containing foods (dairy, starches, and fruits). The meal plan below is an example of a 2000 calorie diet using carbohydrate counting. This meal plan has 17 carbohydrate servings. Breakfast  1 cup oatmeal (2 carb servings).   cup  light yogurt (1 carb serving).  1 cup blueberries (1 carb serving).   cup almonds. Snack  1 large apple (2 carb servings).  1 low-fat string cheese stick. Lunch  Chicken breast salad.  1 cup spinach.   cup chopped tomatoes.  2 oz chicken breast, sliced.  2 tbs low-fat [place] dressing.  12 whole-wheat crackers (2 carb servings).  12 to 15 grapes (1 carb serving).  1 cup low-fat milk (1 carb serving). Snack  1 cup carrots.   cup hummus (1 carb serving). Dinner  3 oz broiled salmon.  1 cup brown rice (3 carb servings). Snack  1  cups steamed broccoli (1 carb serving) drizzled with 1 tsp olive oil and lemon juice.  1 cup light pudding (2 carb servings). DIABETES MEAL PLANNING WORKSHEET Your dietician can use this worksheet to help you decide how many servings of foods and what types of foods are right  for you.  BREAKFAST Food Group and Servings / Carb Servings Grain/Starches __________________________________ Dairy __________________________________________ Vegetable ______________________________________ Fruit ___________________________________________ Meat __________________________________________ Fat ____________________________________________ LUNCH Food Group and Servings / Carb Servings Grain/Starches ___________________________________ Dairy ___________________________________________ Fruit ____________________________________________ Meat ___________________________________________ Fat _____________________________________________ Adam [name] Food Group and Servings / Carb Servings Grain/Starches ___________________________________ Dairy ___________________________________________ Fruit ____________________________________________ Meat ___________________________________________ Fat _____________________________________________ SNACKS Food Group and Servings / Carb Servings Grain/Starches ___________________________________ Dairy  ___________________________________________ Vegetable _______________________________________ Fruit ____________________________________________ Meat ___________________________________________ Fat _____________________________________________ DAILY TOTALS Starches _________________________ Vegetable ________________________ Fruit ____________________________ Dairy ____________________________ Meat ____________________________ Fat ______________________________ Document Released: 11/07/2004 Document Revised: 05/05/2011 Document Reviewed: 09/18/2008 [employer] Patient Information 2014 [employer].

## 2013-07-08 NOTE — Progress Notes (Signed)
Pre visit review using our clinic review tool, if applicable. No additional management support is needed unless otherwise documented below in the visit note. 

## 2013-07-08 NOTE — Assessment & Plan Note (Signed)
Seems to be better Discussed weight loss and lifestyle

## 2013-07-08 NOTE — Assessment & Plan Note (Signed)
BP Readings from Last 3 Encounters:  07/08/13 140/80  04/01/13 140/80  03/22/13 120/80   Acceptable control Due for labs

## 2013-07-08 NOTE — Assessment & Plan Note (Signed)
PSA and zostavax next year Colon due 2017 Gets flu shot

## 2013-07-12 ENCOUNTER — Encounter: Payer: Self-pay | Admitting: *Deleted

## 2013-07-14 ENCOUNTER — Encounter: Payer: BC Managed Care – PPO | Admitting: Gastroenterology

## 2013-08-11 ENCOUNTER — Other Ambulatory Visit: Payer: Self-pay | Admitting: Internal Medicine

## 2013-09-08 LAB — HM DIABETES EYE EXAM

## 2013-09-12 ENCOUNTER — Encounter: Payer: Self-pay | Admitting: Internal Medicine

## 2013-10-04 ENCOUNTER — Other Ambulatory Visit: Payer: Self-pay | Admitting: Internal Medicine

## 2013-10-21 ENCOUNTER — Encounter: Payer: Self-pay | Admitting: *Deleted

## 2013-10-21 ENCOUNTER — Ambulatory Visit (INDEPENDENT_AMBULATORY_CARE_PROVIDER_SITE_OTHER): Payer: BC Managed Care – PPO | Admitting: Internal Medicine

## 2013-10-21 ENCOUNTER — Encounter: Payer: Self-pay | Admitting: Internal Medicine

## 2013-10-21 VITALS — BP 140/80 | HR 65 | Temp 98.3°F | Wt 273.0 lb

## 2013-10-21 DIAGNOSIS — E1149 Type 2 diabetes mellitus with other diabetic neurological complication: Secondary | ICD-10-CM

## 2013-10-21 DIAGNOSIS — E785 Hyperlipidemia, unspecified: Secondary | ICD-10-CM

## 2013-10-21 LAB — LIPID PANEL
Cholesterol: 195 mg/dL (ref 0–200)
HDL: 39.9 mg/dL (ref >39.00)
LDL Cholesterol: 127 mg/dL — ABNORMAL HIGH (ref 0–99)
NONHDL: 155.1
Total CHOL/HDL Ratio: 5
Triglycerides: 140 mg/dL (ref 0.0–149.0)
VLDL: 28 mg/dL (ref 0.0–40.0)

## 2013-10-21 LAB — HEMOGLOBIN A1C: Hgb A1c MFr Bld: 11.2 % — ABNORMAL HIGH (ref 4.6–6.5)

## 2013-10-21 NOTE — Assessment & Plan Note (Signed)
Will recheck since he is taking the lipitor again

## 2013-10-21 NOTE — Progress Notes (Signed)
Subjective:    Patient ID: Adam [name], male    DOB: 01-18-1951, 63 y.o.   MRN: [id number]  HPI Reviewed his last labs Admits he wasn't taking the lipitor or metformin Money was short......  Has been back on track with meds Reviewed all his meds and taking now  Checks sugars once or twice a week Usually in 140's random No low sugar reactions  Current Outpatient Prescriptions on File Prior to Visit  Medication Sig Dispense Refill  . aspirin 81 MG tablet Take 81 mg by mouth daily.        Marland Kitchen atorvastatin (LIPITOR) 80 MG tablet TAKE ONE TABLET BY MOUTH EVERY DAY  90 tablet  3  . B-D INS SYR MICROFINE 1CC/28G 28G X 1/2" 1 ML MISC INJECT 100 UNITS SQ AS DIRECTED TWICE DAILY  200 each  0  . glipiZIDE (GLUCOTROL) 10 MG tablet TAKE ONE TABLET BY MOUTH EVERY DAY  90 tablet  3  . lisinopril-hydrochlorothiazide (PRINZIDE,ZESTORETIC) 20-25 MG per tablet TAKE ONE TABLET BY MOUTH ONCE DAILY  90 tablet  0  . metFORMIN (GLUCOPHAGE) 1000 MG tablet TAKE ONE TABLET BY MOUTH TWICE DAILY WITH MEALS  180 tablet  0  . NOVOLOG MIX 70/30 (70-30) 100 UNIT/ML injection INJECT 100 UNITS UNDER THE SKIN TWICE A DAY WITH MEALS  20 vial  2   No current facility-administered medications on file prior to visit.    No Known Allergies  Past Medical History  Diagnosis Date  . History of colonic polyps     Hyperplastic  . Diabetes mellitus type II 2002    Hospitalized for very high sugars  . Diverticulosis of colon   . GERD (gastroesophageal reflux disease)   . Hypertension   . Phimosis 2003    Repair  . Venous insufficiency   . Hyperlipidemia   . Cataract     Past Surgical History  Procedure Laterality Date  . Hydrocele excision / repair  10/07    [name])    Family History  Problem Relation Age of Onset  . Diabetes Mother   . Hypertension Mother   . Diabetes Father   . Mental illness Brother     Hx of schizophrenia  . Diabetes Brother   . Hypertension Brother   . Colon cancer Neg Hx       History   Social History  . Marital Status: Married    Spouse Name: N/A    Number of Children: 3  . Years of Education: N/A   Occupational History  . 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 Smoking status: Former Smoker -- 37 years    Types: Cigarettes    Quit date: 02/24/1998  . Smokeless tobacco: Never Used  . Alcohol Use: No  . Drug Use: No  . Sexual Activity: Not on file   Other Topics Concern  . Not on file   Social History Narrative  . No narrative on file   Review of Systems Sleeps okay Weight is stable    Objective:   Physical Exam  Constitutional: He appears well-developed and well-nourished. No distress.  Neck: Normal range of motion. Neck supple. No thyromegaly present.  Cardiovascular: Normal rate, regular rhythm and normal heart sounds.  Exam reveals no gallop.   No murmur heard. Pulmonary/Chest: Effort normal and breath sounds normal. No respiratory distress. He has no wheezes. He has no rales.  Lymphadenopathy:    He has no cervical adenopathy.  Psychiatric:  He has a normal mood and affect. His behavior is normal.          Assessment & Plan:

## 2013-10-21 NOTE — Progress Notes (Signed)
Pre visit review using our clinic review tool, if applicable. No additional management support is needed unless otherwise documented below in the visit note. 

## 2013-10-21 NOTE — Assessment & Plan Note (Signed)
Hopefully better now back on metformin Will recheck Don't really expect under 9%---he can't afford any other meds either Encouraged working hard on healthy eating

## 2013-12-13 ENCOUNTER — Other Ambulatory Visit: Payer: Self-pay | Admitting: Internal Medicine

## 2014-01-10 ENCOUNTER — Ambulatory Visit: Payer: BC Managed Care – PPO | Admitting: Internal Medicine

## 2014-01-16 ENCOUNTER — Other Ambulatory Visit: Payer: Self-pay | Admitting: Internal Medicine

## 2014-02-07 ENCOUNTER — Other Ambulatory Visit: Payer: Self-pay | Admitting: Internal Medicine

## 2014-02-13 DIAGNOSIS — H4011X2 Primary open-angle glaucoma, moderate stage: Secondary | ICD-10-CM | POA: Diagnosis not present

## 2014-02-24 DIAGNOSIS — R011 Cardiac murmur, unspecified: Secondary | ICD-10-CM

## 2014-02-24 HISTORY — DX: Cardiac murmur, unspecified: R01.1

## 2014-02-25 ENCOUNTER — Other Ambulatory Visit: Payer: Self-pay | Admitting: Internal Medicine

## 2014-02-28 ENCOUNTER — Telehealth: Payer: Self-pay | Admitting: *Deleted

## 2014-02-28 NOTE — Telephone Encounter (Signed)
Will evaluate at appt in the morning

## 2014-02-28 NOTE — Telephone Encounter (Signed)
[facility] Patient Name: Adam [name] Gender: Male DOB: 08/26/1950 Age: 64 Y 1 M 19 D Return Phone Number: [phone] (Primary) Address: [address] City/State/Zip: [place] [address] Client [facility] - Client Client Site [place] - Day Physician Viviana Simpler Contact Type Call Call Type Triage / [place] Name [name] Relationship To Patient Spouse Return Phone Number [phone] (Primary) Chief Complaint Skin Lesion - Moles/ Lumps/ Growths Initial Comment caller states husband has a painful lump on back of his head thats getting larger PreDisposition Call Doctor Nurse Assessment Nurse: Kenton Kingfisher, RN, Meagan Date/Time (Eastern Time): 02/28/2014 9:23:08 AM Confirm and document reason for call. If symptomatic, describe symptoms. ---Caller states he has a painful lump on the back of his head and is getting bigger. Caller states no injury. Caller states mentioned it before and it was smaller with no pain. Caller states it is on the left side behind ear around his skull. Has the patient traveled out of the country within the last 30 days? ---Not Applicable Does the patient require triage? ---Yes Related visit to physician within the last 2 weeks? ---No Does the PT have any chronic conditions? (i.e. diabetes, asthma, etc.) ---Yes List chronic conditions. ---Diabetes Guidelines Guideline Title Affirmed Question Affirmed Notes Nurse Date/Time Eilene Ghazi Time) Lymph Nodes Swollen [1] Single large node AND [2] size > 1 inch (2.5 cm) AND [3] no fever Kenton Kingfisher, RN, Meagan 02/28/2014 9:24:44 AM Disp. Time Eilene Ghazi Time) Disposition Final User 02/28/2014 8:34:08 AM Send To Clinical Follow Up Queue [facility] 02/28/2014 9:26:24 AM See Physician within 24 Hours Yes Kenton Kingfisher, RN, Meagan Caller Understands: Yes PLEASE NOTE: All  timestamps contained within this report are represented as [place] Standard Time. CONFIDENTIALTY NOTICE: This fax transmission is intended only for the addressee. It contains information that is legally privileged, confidential or otherwise protected from use or disclosure. If you are not the intended recipient, you are strictly prohibited from reviewing, disclosing, copying using or disseminating any of this information or taking any action in reliance on or regarding this information. If you have received this fax in error, please notify us immediately by telephone so that we can arrange for its return to [place]. Phone: [phone], Toll-Free: [phone], Fax: [phone] Page: 2 of 2 Call Id: [id number] Disagree/Comply: Comply Care Advice Given Per Guideline SEE PHYSICIAN WITHIN 24 HOURS: * IF OFFICE WILL BE OPEN: You need to be examined within the next 24 hours. Call your doctor when the office opens, and make an appointment. PAIN MEDICINES: ACETAMINOPHEN (E.G., TYLENOL): * Take 650 mg (two 325 mg pills) by mouth every 4-6 hours as needed. Each Regular Strength Tylenol pill has 325 mg of acetaminophen. The most you should take each day is 3,250 mg (10 Regular Strength pills a day). * Another choice is to take 1,000 mg (two 500 mg pills) every 8 hours as needed. Each Extra Strength Tylenol pill has 500 mg of acetaminophen. The most you should take each day is 3,000 mg (6 Extra Strength pills a day). CALL BACK IF: * You become worse. CARE ADVICE given per Lymph Nodes Swollen (Adult) guideline. After Care Instructions Given Call Event Type User Date / Time Description Comments User: Dayton Martes, RN Date/Time Eilene Ghazi Time): 02/28/2014 9:32:58 AM [phone] Option 8 called to make an appt for the caller. Spoke with Adrienne 8:15 am. appt scheduled with Dr. Silvio Pate for Wednesday 03-01-14. Spoke  back with caller and he will take the appt and see the MD tomorrow. Referrals REFERRED TO PCP OFFICE

## 2014-03-01 ENCOUNTER — Ambulatory Visit (INDEPENDENT_AMBULATORY_CARE_PROVIDER_SITE_OTHER): Payer: Medicare Other | Admitting: Internal Medicine

## 2014-03-01 ENCOUNTER — Encounter: Payer: Self-pay | Admitting: Internal Medicine

## 2014-03-01 VITALS — BP 134/78 | HR 75 | Temp 98.3°F | Wt 269.0 lb

## 2014-03-01 DIAGNOSIS — L723 Sebaceous cyst: Secondary | ICD-10-CM

## 2014-03-01 DIAGNOSIS — L089 Local infection of the skin and subcutaneous tissue, unspecified: Secondary | ICD-10-CM

## 2014-03-01 MED ORDER — CEPHALEXIN 500 MG PO CAPS: 500.0000 mg | ORAL_CAPSULE | Freq: Four times a day (QID) | ORAL | Status: DC

## 2014-03-01 NOTE — Assessment & Plan Note (Signed)
No fluctuant area to I&D Has slight hole in center--discussed continuing warm compresses cephalexin

## 2014-03-01 NOTE — Progress Notes (Signed)
   Subjective:    Patient ID: Adam [name], male    DOB: 1950/06/08, 64 y.o.   MRN: [id number]  HPI Here to evaluate lump on left posterior head  Noticed it some time ago-- I had checked it in past and was benign Now with pain in past few days--may have been after getting hair cut by new barber Pain into left neck  No fever No discharge or drainage  Tried tylenol Warm compress--seemed to help a little  Current Outpatient Prescriptions on File Prior to Visit  Medication Sig Dispense Refill  . aspirin 81 MG tablet Take 81 mg by mouth daily.      Marland Kitchen atorvastatin (LIPITOR) 80 MG tablet TAKE ONE TABLET BY MOUTH ONCE DAILY 90 tablet 0  . B-D INS SYR MICROFINE 1CC/28G 28G X 1/2" 1 ML MISC INJECT 100 UNITS SQ AS DIRECTED TWICE DAILY 200 each 0  . glipiZIDE (GLUCOTROL) 10 MG tablet TAKE ONE TABLET BY MOUTH ONCE DAILY 90 tablet 0  . lisinopril-hydrochlorothiazide (PRINZIDE,ZESTORETIC) 20-25 MG per tablet TAKE ONE TABLET BY MOUTH ONCE DAILY 90 tablet 0  . metFORMIN (GLUCOPHAGE) 1000 MG tablet TAKE ONE TABLET BY MOUTH TWICE DAILY WITH  MEALS 180 tablet 0  . NOVOLOG MIX 70/30 (70-30) 100 UNIT/ML injection INJECT 100 UNITS UNDER THE SKIN TWICE A DAY WITH MEALS 20 vial 2   No current facility-administered medications on file prior to visit.    No Known Allergies  Past Medical History  Diagnosis Date  . History of colonic polyps     Hyperplastic  . Diabetes mellitus type II 2002    Hospitalized for very high sugars  . Diverticulosis of colon   . GERD (gastroesophageal reflux disease)   . Hypertension   . Phimosis 2003    Repair  . Venous insufficiency   . Hyperlipidemia   . Cataract     Past Surgical History  Procedure Laterality Date  . Hydrocele excision / repair  10/07    [name])    Family History  Problem Relation Age of Onset  . Diabetes Mother   . Hypertension Mother   . Diabetes Father   . Mental illness Brother     Hx of schizophrenia  . Diabetes Brother   .  Hypertension Brother   . Colon cancer Neg Hx     History   Social History  . Marital Status: Married    Spouse Name: N/A    Number of Children: 3  . Years of Education: N/A   Occupational History  . 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 Smoking status: Former Smoker -- 37 years    Types: Cigarettes    Quit date: 02/24/1998  . Smokeless tobacco: Never Used  . Alcohol Use: No  . Drug Use: No  . Sexual Activity: Not on file   Other Topics Concern  . Not on file   Social History Narrative   Review of Systems  Sugars checked occasionally --- "pretty good" No fever     Objective:   Physical Exam  Constitutional: He appears well-developed and well-nourished. No distress.  Neck: Normal range of motion. Neck supple.  Lymphadenopathy:    He has no cervical adenopathy.  Skin:  4-5cm indurated cyst in left occiput Slight warmth and tenderness Not red or fluctuant          Assessment & Plan:

## 2014-03-01 NOTE — Progress Notes (Signed)
Pre visit review using our clinic review tool, if applicable. No additional management support is needed unless otherwise documented below in the visit note. 

## 2014-03-15 ENCOUNTER — Ambulatory Visit (INDEPENDENT_AMBULATORY_CARE_PROVIDER_SITE_OTHER): Payer: Medicare Other | Admitting: Internal Medicine

## 2014-03-15 ENCOUNTER — Encounter: Payer: Self-pay | Admitting: Internal Medicine

## 2014-03-15 VITALS — BP 158/80 | HR 94 | Temp 98.3°F | Wt 270.0 lb

## 2014-03-15 DIAGNOSIS — Z889 Allergy status to unspecified drugs, medicaments and biological substances status: Secondary | ICD-10-CM | POA: Diagnosis not present

## 2014-03-15 DIAGNOSIS — T50905A Adverse effect of unspecified drugs, medicaments and biological substances, initial encounter: Secondary | ICD-10-CM | POA: Insufficient documentation

## 2014-03-15 MED ORDER — PREDNISONE 20 MG PO TABS: 40.0000 mg | ORAL_TABLET | Freq: Every day | ORAL | Status: DC

## 2014-03-15 NOTE — Assessment & Plan Note (Signed)
Cephalexin on allergy list Prednisone for 5 days Advised cetirizine

## 2014-03-15 NOTE — Progress Notes (Signed)
   Subjective:    Patient ID: Adam [name], male    DOB: [birth year], 64 y.o.   MRN: [id number]  HPI Here due to rash  The cyst on his neck is better Only took the cephalexin for a few days--- stopped due to rash starting Very itchy Has been keeping him up  Current Outpatient Prescriptions on File Prior to Visit  Medication Sig Dispense Refill  . aspirin 81 MG tablet Take 81 mg by mouth daily.      Marland Kitchen atorvastatin (LIPITOR) 80 MG tablet TAKE ONE TABLET BY MOUTH ONCE DAILY 90 tablet 0  . B-D INS SYR MICROFINE 1CC/28G 28G X 1/2" 1 ML MISC INJECT 100 UNITS SQ AS DIRECTED TWICE DAILY 200 each 0  . glipiZIDE (GLUCOTROL) 10 MG tablet TAKE ONE TABLET BY MOUTH ONCE DAILY 90 tablet 0  . lisinopril-hydrochlorothiazide (PRINZIDE,ZESTORETIC) 20-25 MG per tablet TAKE ONE TABLET BY MOUTH ONCE DAILY 90 tablet 0  . metFORMIN (GLUCOPHAGE) 1000 MG tablet TAKE ONE TABLET BY MOUTH TWICE DAILY WITH  MEALS 180 tablet 0  . NOVOLOG MIX 70/30 (70-30) 100 UNIT/ML injection INJECT 100 UNITS UNDER THE SKIN TWICE A DAY WITH MEALS 20 vial 2   No current facility-administered medications on file prior to visit.    Allergies  Allergen Reactions  . Cephalexin Rash    Past Medical History  Diagnosis Date  . History of colonic polyps     Hyperplastic  . Diabetes mellitus type II 2002    Hospitalized for very high sugars  . Diverticulosis of colon   . GERD (gastroesophageal reflux disease)   . Hypertension   . Phimosis 2003    Repair  . Venous insufficiency   . Hyperlipidemia   . Cataract     Past Surgical History  Procedure Laterality Date  . Hydrocele excision / repair  10/07    [name])    Family History  Problem Relation Age of Onset  . Diabetes Mother   . Hypertension Mother   . Diabetes Father   . Mental illness Brother     Hx of schizophrenia  . Diabetes Brother   . Hypertension Brother   . Colon cancer Neg Hx     History   Social History  . Marital Status: Married    Spouse  Name: N/A    Number of Children: 3  . Years of Education: N/A   Occupational History  . 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 Smoking status: Former Smoker -- 37 years    Types: Cigarettes    Quit date: 02/24/1998  . Smokeless tobacco: Never Used  . Alcohol Use: No  . Drug Use: No  . Sexual Activity: Not on file   Other Topics Concern  . Not on file   Social History Narrative   Review of Systems No fever No vomiting or diarrhea No lip or mouth swelling--face feels tight though    Objective:   Physical Exam  Neck:  Cyst is same size and indurated but not inflamed  Skin:  Widespread macular confluent red rash on arms and trunk No oral lesions          Assessment & Plan:

## 2014-03-15 NOTE — Patient Instructions (Signed)
Please take 2 of the prednisone daily for 5 days. You can take over the counter cetirizine-- 10mg  daily. That may also reduce your itching

## 2014-04-09 ENCOUNTER — Other Ambulatory Visit: Payer: Self-pay | Admitting: Internal Medicine

## 2014-04-25 ENCOUNTER — Encounter: Payer: Self-pay | Admitting: Internal Medicine

## 2014-04-25 ENCOUNTER — Ambulatory Visit: Payer: BC Managed Care – PPO | Admitting: Internal Medicine

## 2014-04-25 ENCOUNTER — Ambulatory Visit (INDEPENDENT_AMBULATORY_CARE_PROVIDER_SITE_OTHER): Payer: Medicare Other | Admitting: Internal Medicine

## 2014-04-25 VITALS — BP 138/90 | HR 88 | Temp 98.4°F | Wt 270.1 lb

## 2014-04-25 DIAGNOSIS — E785 Hyperlipidemia, unspecified: Secondary | ICD-10-CM | POA: Diagnosis not present

## 2014-04-25 DIAGNOSIS — Z23 Encounter for immunization: Secondary | ICD-10-CM

## 2014-04-25 DIAGNOSIS — M629 Disorder of muscle, unspecified: Secondary | ICD-10-CM | POA: Diagnosis not present

## 2014-04-25 DIAGNOSIS — I10 Essential (primary) hypertension: Secondary | ICD-10-CM

## 2014-04-25 DIAGNOSIS — S93699A Other sprain of unspecified foot, initial encounter: Secondary | ICD-10-CM | POA: Insufficient documentation

## 2014-04-25 DIAGNOSIS — E1165 Type 2 diabetes mellitus with hyperglycemia: Secondary | ICD-10-CM

## 2014-04-25 DIAGNOSIS — E1149 Type 2 diabetes mellitus with other diabetic neurological complication: Secondary | ICD-10-CM

## 2014-04-25 DIAGNOSIS — IMO0002 Reserved for concepts with insufficient information to code with codable children: Secondary | ICD-10-CM

## 2014-04-25 DIAGNOSIS — E1142 Type 2 diabetes mellitus with diabetic polyneuropathy: Secondary | ICD-10-CM

## 2014-04-25 LAB — RENAL FUNCTION PANEL
Albumin: 3.4 g/dL — ABNORMAL LOW (ref 3.5–5.2)
BUN: 24 mg/dL — ABNORMAL HIGH (ref 6–23)
CO2: 30 mEq/L (ref 19–32)
Calcium: 9.3 mg/dL (ref 8.4–10.5)
Chloride: 98 mEq/L (ref 96–112)
Creatinine, Ser: 1.32 mg/dL (ref 0.40–1.50)
GFR: 69.94 mL/min (ref >60.00)
Glucose, Bld: 443 mg/dL — ABNORMAL HIGH (ref 70–99)
PHOSPHORUS: 3.2 mg/dL (ref 2.3–4.6)
Potassium: 4 mEq/L (ref 3.5–5.1)
SODIUM: 132 meq/L — AB (ref 135–145)

## 2014-04-25 LAB — HEMOGLOBIN A1C: Hgb A1c MFr Bld: 15 % — ABNORMAL HIGH (ref 4.6–6.5)

## 2014-04-25 LAB — HM DIABETES FOOT EXAM

## 2014-04-25 NOTE — Assessment & Plan Note (Signed)
No sig pain so will avoid meds

## 2014-04-25 NOTE — Addendum Note (Signed)
Addended by: Amado Coe on: 04/25/2014 10:32 AM   Modules accepted: Orders

## 2014-04-25 NOTE — Progress Notes (Signed)
Subjective:    Patient ID: Adam [name], male    DOB: June 02, 1950, 64 y.o.   MRN: [id number]  HPI Here for follow up of diabetes and other medical problems  He joined gym Did notice some soreness in back of right plantar foot Seemed better but then yesterday he was trying to reach over dryer and he heard pop--but then severe pain in foot Tried some tylenol --helped pain some No ice Still has good ROM of foot  Has been taking meds regularly Checks his sugars occasionally in AM--- usually 140's No low sugar reactions but did have a 62 once (no symptoms)  No chest pain No SOB Mild edema in feet No dizziness or syncope  Current Outpatient Prescriptions on File Prior to Visit  Medication Sig Dispense Refill  . aspirin 81 MG tablet Take 81 mg by mouth daily.      Marland Kitchen atorvastatin (LIPITOR) 80 MG tablet TAKE ONE TABLET BY MOUTH ONCE DAILY 90 tablet 0  . B-D INS SYR MICROFINE 1CC/28G 28G X 1/2" 1 ML MISC INJECT 100 UNITS SQ AS DIRECTED TWICE DAILY 200 each 0  . glipiZIDE (GLUCOTROL) 10 MG tablet TAKE ONE TABLET BY MOUTH ONCE DAILY 90 tablet 0  . lisinopril-hydrochlorothiazide (PRINZIDE,ZESTORETIC) 20-25 MG per tablet TAKE ONE TABLET BY MOUTH ONCE DAILY 90 tablet 0  . metFORMIN (GLUCOPHAGE) 1000 MG tablet TAKE ONE TABLET BY MOUTH TWICE DAILY WITH  MEALS 180 tablet 0  . NOVOLOG MIX 70/30 (70-30) 100 UNIT/ML injection INJECT 100 UNITS UNDER THE SKIN TWICE A DAY WITH MEALS 20 vial 2  . predniSONE (DELTASONE) 20 MG tablet Take 2 tablets (40 mg total) by mouth daily. 10 tablet 0   No current facility-administered medications on file prior to visit.    Allergies  Allergen Reactions  . Cephalexin Rash    Past Medical History  Diagnosis Date  . History of colonic polyps     Hyperplastic  . Diabetes mellitus type II 2002    Hospitalized for very high sugars  . Diverticulosis of colon   . GERD (gastroesophageal reflux disease)   . Hypertension   . Phimosis 2003    Repair  .  Venous insufficiency   . Hyperlipidemia   . Cataract     Past Surgical History  Procedure Laterality Date  . Hydrocele excision / repair  10/07    [name])    Family History  Problem Relation Age of Onset  . Diabetes Mother   . Hypertension Mother   . Diabetes Father   . Mental illness Brother     Hx of schizophrenia  . Diabetes Brother   . Hypertension Brother   . Colon cancer Neg Hx     History   Social History  . Marital Status: Married    Spouse Name: N/A  . Number of Children: 3  . Years of Education: N/A   Occupational History  . 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 Smoking status: Former Smoker -- 37 years    Types: Cigarettes    Quit date: 02/24/1998  . Smokeless tobacco: Never Used  . Alcohol Use: No  . Drug Use: No  . Sexual Activity: Not on file   Other Topics Concern  . Not on file   Social History Narrative    Review of Systems Sleeps well Appetite is okay Weight is stable    Objective:   Physical Exam  Constitutional: He appears well-developed and well-nourished. No  distress.  Neck: Normal range of motion. Neck supple. No thyromegaly present.  Cardiovascular: Normal rate, regular rhythm, normal heart sounds and intact distal pulses.  Exam reveals no gallop.   No murmur heard. Faint distal pulses  Pulmonary/Chest: Effort normal and breath sounds normal. No respiratory distress. He has no wheezes. He has no rales.  Abdominal: Soft. There is no tenderness.  Musculoskeletal:  1+ ankle edema Tenderness along plantar fascia in front of heel on right Antalgic gait  Lymphadenopathy:    He has no cervical adenopathy.  Neurological:  Decreased sensation in feet  Skin:  No foot lesions          Assessment & Plan:

## 2014-04-25 NOTE — Progress Notes (Signed)
Pre visit review using our clinic review tool, if applicable. No additional management support is needed unless otherwise documented below in the visit note. 

## 2014-04-25 NOTE — Assessment & Plan Note (Signed)
No problems with statin 

## 2014-04-25 NOTE — Assessment & Plan Note (Signed)
BP Readings from Last 3 Encounters:  04/25/14 138/90  03/15/14 158/80  03/01/14 134/78   Acceptable control now

## 2014-04-25 NOTE — Assessment & Plan Note (Signed)
Seems partial Has good function Discussed ice and tylenol Non weight bearing exercises till better Consider podiatry if no improvement

## 2014-04-25 NOTE — Assessment & Plan Note (Signed)
Starting to exercise Due for labs

## 2014-04-26 ENCOUNTER — Telehealth: Payer: Self-pay

## 2014-04-26 NOTE — Telephone Encounter (Signed)
-----   Message from [name], MD sent at 04/26/2014  7:54 AM EST ----- Please call him The blood work is okay other than the diabetes control. (I suspect he did run out of the insulin and just didn't tell us). Make sure he is able to buy his meds--consider referral to [facility] for our assistance program

## 2014-04-26 NOTE — Telephone Encounter (Signed)
Pt left v/m requesting cb about insulin being called in to [address].Please advise.

## 2014-04-26 NOTE — Telephone Encounter (Signed)
Informed patient of lab results.  Advised him he needs to take his insulin.  Asked him if was able to pay for it at this time and he said he was able to.  [facility]  Patient also aware a form has been mailed to him for patient assistance.  He needs to fill out his financial information and get the form to [employer].  Patient will call if he cannot pay for insulin, but he says he can at this time.

## 2014-04-27 MED ORDER — INSULIN ASPART PROT & ASPART (70-30 MIX) 100 UNIT/ML ~~LOC~~ SUSP: SUBCUTANEOUS | Status: DC

## 2014-04-27 NOTE — Telephone Encounter (Signed)
VM left requesting cb to verify insulin had been sent to [address]. Spoke with pt and advised med was sent to [facility] earlier today. Pt will ck with pharmacy.

## 2014-04-27 NOTE — Addendum Note (Signed)
Addended by: Amado Coe on: 04/27/2014 08:24 AM   Modules accepted: Orders

## 2014-04-27 NOTE — Telephone Encounter (Signed)
Left message informing patient that his novolog has been sent to [place].

## 2014-05-01 ENCOUNTER — Other Ambulatory Visit: Payer: Self-pay | Admitting: Internal Medicine

## 2014-05-04 ENCOUNTER — Other Ambulatory Visit: Payer: Self-pay

## 2014-05-04 MED ORDER — INSULIN ASPART PROT & ASPART (70-30 MIX) 100 UNIT/ML ~~LOC~~ SUSP
SUBCUTANEOUS | Status: DC
Start: 2014-05-04 — End: 2015-03-07

## 2014-05-08 NOTE — Telephone Encounter (Signed)
Pt called and gave permission to speak with wife about med refills; Mrs Cromie wanted to verify that novolog 70/[facility]; advised [facility]14. Mrs Meyn said pt was going to start to use [facility] on some meds and would cb when needs refill on other medications.

## 2014-05-15 ENCOUNTER — Other Ambulatory Visit: Payer: Self-pay | Admitting: Internal Medicine

## 2014-05-26 LAB — HM DIABETES EYE EXAM

## 2014-07-20 ENCOUNTER — Other Ambulatory Visit: Payer: Self-pay | Admitting: Internal Medicine

## 2014-08-05 ENCOUNTER — Other Ambulatory Visit: Payer: Self-pay | Admitting: Internal Medicine

## 2014-11-07 ENCOUNTER — Ambulatory Visit (INDEPENDENT_AMBULATORY_CARE_PROVIDER_SITE_OTHER): Payer: Medicare Other | Admitting: Internal Medicine

## 2014-11-07 ENCOUNTER — Encounter: Payer: Self-pay | Admitting: Internal Medicine

## 2014-11-07 VITALS — BP 148/80 | HR 88 | Temp 97.9°F | Ht 71.0 in | Wt 273.0 lb

## 2014-11-07 DIAGNOSIS — Z0000 Encounter for general adult medical examination without abnormal findings: Secondary | ICD-10-CM

## 2014-11-07 DIAGNOSIS — I872 Venous insufficiency (chronic) (peripheral): Secondary | ICD-10-CM

## 2014-11-07 DIAGNOSIS — Z7189 Other specified counseling: Secondary | ICD-10-CM

## 2014-11-07 DIAGNOSIS — E1141 Type 2 diabetes mellitus with diabetic mononeuropathy: Secondary | ICD-10-CM

## 2014-11-07 DIAGNOSIS — I10 Essential (primary) hypertension: Secondary | ICD-10-CM

## 2014-11-07 DIAGNOSIS — E785 Hyperlipidemia, unspecified: Secondary | ICD-10-CM | POA: Diagnosis not present

## 2014-11-07 DIAGNOSIS — E1165 Type 2 diabetes mellitus with hyperglycemia: Secondary | ICD-10-CM

## 2014-11-07 DIAGNOSIS — R351 Nocturia: Secondary | ICD-10-CM

## 2014-11-07 DIAGNOSIS — IMO0002 Reserved for concepts with insufficient information to code with codable children: Secondary | ICD-10-CM

## 2014-11-07 DIAGNOSIS — Z23 Encounter for immunization: Secondary | ICD-10-CM

## 2014-11-07 DIAGNOSIS — E1149 Type 2 diabetes mellitus with other diabetic neurological complication: Secondary | ICD-10-CM

## 2014-11-07 LAB — CBC WITH DIFFERENTIAL/PLATELET
BASOS ABS: 0 10*3/uL (ref 0.0–0.1)
Basophils Relative: 0.6 % (ref 0.0–3.0)
Eosinophils Absolute: 0.3 10*3/uL (ref 0.0–0.7)
Eosinophils Relative: 4.3 % (ref 0.0–5.0)
HEMATOCRIT: 40.6 % (ref 39.0–52.0)
HEMOGLOBIN: 13.1 g/dL (ref 13.0–17.0)
LYMPHS PCT: 53.6 % — AB (ref 12.0–46.0)
Lymphs Abs: 3.8 10*3/uL (ref 0.7–4.0)
MCHC: 32.4 g/dL (ref 30.0–36.0)
MCV: 87.6 fl (ref 78.0–100.0)
MONOS PCT: 5.3 % (ref 3.0–12.0)
Monocytes Absolute: 0.4 10*3/uL (ref 0.1–1.0)
NEUTROS ABS: 2.6 10*3/uL (ref 1.4–7.7)
Neutrophils Relative %: 36.2 % — ABNORMAL LOW (ref 43.0–77.0)
PLATELETS: 215 10*3/uL (ref 150.0–400.0)
RBC: 4.64 Mil/uL (ref 4.22–5.81)
RDW: 14.3 % (ref 11.5–15.5)
WBC: 7.1 10*3/uL (ref 4.0–10.5)

## 2014-11-07 LAB — PSA: PSA: 1.57 ng/mL (ref 0.10–4.00)

## 2014-11-07 LAB — LIPID PANEL
CHOL/HDL RATIO: 7
CHOLESTEROL: 295 mg/dL — AB (ref 0–200)
HDL: 43.4 mg/dL (ref >39.00)
NONHDL: 252.09
Triglycerides: 300 mg/dL — ABNORMAL HIGH (ref 0.0–149.0)
VLDL: 60 mg/dL — AB (ref 0.0–40.0)

## 2014-11-07 LAB — LDL CHOLESTEROL, DIRECT: LDL DIRECT: 184 mg/dL

## 2014-11-07 LAB — T4, FREE: Free T4: 1 ng/dL (ref 0.60–1.60)

## 2014-11-07 LAB — COMPREHENSIVE METABOLIC PANEL
ALBUMIN: 3.4 g/dL — AB (ref 3.5–5.2)
ALT: 28 U/L (ref 0–53)
AST: 24 U/L (ref 0–37)
Alkaline Phosphatase: 67 U/L (ref 39–117)
BILIRUBIN TOTAL: 0.3 mg/dL (ref 0.2–1.2)
BUN: 24 mg/dL — ABNORMAL HIGH (ref 6–23)
CALCIUM: 9.2 mg/dL (ref 8.4–10.5)
CO2: 29 meq/L (ref 19–32)
Chloride: 99 mEq/L (ref 96–112)
Creatinine, Ser: 1.39 mg/dL (ref 0.40–1.50)
GFR: 65.78 mL/min (ref >60.00)
Glucose, Bld: 351 mg/dL — ABNORMAL HIGH (ref 70–99)
Potassium: 4.4 mEq/L (ref 3.5–5.1)
Sodium: 135 mEq/L (ref 135–145)
Total Protein: 7.5 g/dL (ref 6.0–8.3)

## 2014-11-07 LAB — HEMOGLOBIN A1C: Hgb A1c MFr Bld: 14.9 % — ABNORMAL HIGH (ref 4.6–6.5)

## 2014-11-07 LAB — HM DIABETES FOOT EXAM

## 2014-11-07 MED ORDER — ZOSTER VACCINE LIVE 19400 UNT/0.65ML ~~LOC~~ SOLR: 0.6500 mL | Freq: Once | SUBCUTANEOUS | Status: DC

## 2014-11-07 NOTE — Assessment & Plan Note (Signed)
More consistent with statin lately Will check labs

## 2014-11-07 NOTE — Progress Notes (Signed)
Pre visit review using our clinic review tool, if applicable. No additional management support is needed unless otherwise documented below in the visit note. 

## 2014-11-07 NOTE — Assessment & Plan Note (Signed)
BP Readings from Last 3 Encounters:  11/07/14 148/80  04/25/14 138/90  03/15/14 158/80   Nerves up today Will hold off on changes

## 2014-11-07 NOTE — Assessment & Plan Note (Signed)
See social history 

## 2014-11-07 NOTE — Progress Notes (Signed)
Subjective:    Patient ID: Adam [name], male    DOB: 1950/07/25, 64 y.o.   MRN: [id number]  HPI Here for first Medicare wellness and follow up of chronic medical conditions Reviewed form and advanced directives Reviewed other physicians-- Dr Gloriann Loan for eyes, dentist No falls No depression or anhedonia No tobacco or alcohol Has been more active--but recommended daily physical activity Vision and hearing are okay Independent with instrumental ADLs Mild issues with memory--recall issues (mostly of names)  Has been back on insulin Not really checking sugars Not always great with his eating Will occasionally have low sugar reactions-- if misses meal, etc No sores on feet. Some pain but mostly just numbness Some swelling in feet--better in AM  No chest pain No SOB No change in exercise tolerance No palpitations  Current Outpatient Prescriptions on File Prior to Visit  Medication Sig Dispense Refill  . aspirin 81 MG tablet Take 81 mg by mouth daily.      Marland Kitchen atorvastatin (LIPITOR) 80 MG tablet TAKE ONE TABLET BY MOUTH ONCE DAILY 90 tablet 0  . B-D INS SYR MICROFINE 1CC/28G 28G X 1/2" 1 ML MISC INJECT 100 UNITS SQ AS DIRECTED TWICE DAILY 200 each 0  . glipiZIDE (GLUCOTROL) 10 MG tablet TAKE ONE TABLET BY MOUTH ONCE DAILY 90 tablet 1  . insulin aspart protamine- aspart (NOVOLOG MIX 70/30) (70-30) 100 UNIT/ML injection INJECT 100 UNITS UNDER THE SKIN TWICE A DAY WITH MEALS 20 vial 11  . lisinopril-hydrochlorothiazide (PRINZIDE,ZESTORETIC) 20-25 MG per tablet TAKE ONE TABLET BY MOUTH ONCE DAILY 90 tablet 1  . metFORMIN (GLUCOPHAGE) 1000 MG tablet TAKE ONE TABLET BY MOUTH TWICE DAILY WITH MEALS 180 tablet 0   No current facility-administered medications on file prior to visit.    Allergies  Allergen Reactions  . Cephalexin Rash    Past Medical History  Diagnosis Date  . History of colonic polyps     Hyperplastic  . Diabetes mellitus type II 2002    Hospitalized for very high  sugars  . Diverticulosis of colon   . GERD (gastroesophageal reflux disease)   . Hypertension   . Phimosis 2003    Repair  . Venous insufficiency   . Hyperlipidemia   . Cataract     Past Surgical History  Procedure Laterality Date  . Hydrocele excision / repair  10/07    [name])    Family History  Problem Relation Age of Onset  . Diabetes Mother   . Hypertension Mother   . Diabetes Father   . Mental illness Brother     Hx of schizophrenia  . Diabetes Brother   . Hypertension Brother   . Colon cancer Neg Hx     Social History   Social History  . Marital Status: Married    Spouse Name: N/A  . Number of Children: 3  . Years of Education: N/A   Occupational History  . 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 Smoking status: Former Smoker -- 37 years    Types: Cigarettes    Quit date: 02/24/1998  . Smokeless tobacco: Never Used  . Alcohol Use: No  . Drug Use: No  . Sexual Activity: Not on file   Other Topics Concern  . Not on file   Social History Narrative   No living will   Requests wife, then 3 daughter, to make health care decisions   Would  accept resuscitation   Not sure about tube feeds--but might consider   Review of Systems Some ongoing left shoulder pain and decreased ROM since fall a couple of years ago Sleeps well Wears seat belt No significant other joint pains--foot pain did resolve Teeth are okay No rash or suspicious lesions Mild tremor in hands at times--not really bothersome at this point    Objective:   Physical Exam  Constitutional: He is oriented to person, place, and time. He appears well-developed and well-nourished. No distress.  HENT:  Mouth/Throat: Oropharynx is clear and moist. No oropharyngeal exudate.  Neck: Normal range of motion. Neck supple. No thyromegaly present.  Cardiovascular: Normal rate, regular rhythm, normal heart sounds and intact distal pulses.  Exam reveals no  gallop.   No murmur heard. HR elevated some from usual--nervous  Pulmonary/Chest: Effort normal and breath sounds normal. No respiratory distress. He has no wheezes. He has no rales.  Abdominal: Soft. There is no tenderness.  Musculoskeletal: He exhibits no edema or tenderness.  Lymphadenopathy:    He has no cervical adenopathy.  Neurological: He is alert and oriented to person, place, and time.  President-- "Obama, Bush, his daddy (then) Clinton" [id number] Recall 2/3  Decreased sensation in feet  Skin: No rash noted.  No foot lesions  Psychiatric: He has a normal mood and affect. His behavior is normal.          Assessment & Plan:

## 2014-11-07 NOTE — Assessment & Plan Note (Signed)
Mild symptoms 

## 2014-11-07 NOTE — Assessment & Plan Note (Signed)
Back on insulin Control should be better No sig pain in feet to require Rx

## 2014-11-07 NOTE — Addendum Note (Signed)
Addended by: Despina Hidden on: 11/07/2014 03:58 PM   Modules accepted: Orders

## 2014-11-07 NOTE — Assessment & Plan Note (Signed)
I have personally reviewed the Medicare Annual Wellness questionnaire and have noted 1. The patient's medical and social history 2. Their use of alcohol, tobacco or illicit drugs 3. Their current medications and supplements 4. The patient's functional ability including ADL's, fall risks, home safety risks and hearing or visual             impairment. 5. Diet and physical activities 6. Evidence for depression or mood disorders  The patients weight, height, BMI and visual acuity have been recorded in the chart I have made referrals, counseling and provided education to the patient based review of the above and I have provided the pt with a written personalized care plan for preventive services.  I have provided you with a copy of your personalized plan for preventive services. Please take the time to review along with your updated medication list.  Rx for zostavax Prevnar/flu today Colon due in May Will check PSA after discussion

## 2014-11-09 ENCOUNTER — Other Ambulatory Visit: Payer: Self-pay | Admitting: Internal Medicine

## 2014-11-09 DIAGNOSIS — E1149 Type 2 diabetes mellitus with other diabetic neurological complication: Secondary | ICD-10-CM

## 2014-11-09 DIAGNOSIS — E1165 Type 2 diabetes mellitus with hyperglycemia: Principal | ICD-10-CM

## 2014-11-09 DIAGNOSIS — IMO0002 Reserved for concepts with insufficient information to code with codable children: Secondary | ICD-10-CM

## 2014-11-18 ENCOUNTER — Other Ambulatory Visit: Payer: Self-pay | Admitting: Internal Medicine

## 2014-12-26 ENCOUNTER — Other Ambulatory Visit: Payer: Self-pay | Admitting: Internal Medicine

## 2015-01-23 ENCOUNTER — Other Ambulatory Visit: Payer: Self-pay | Admitting: Internal Medicine

## 2015-02-02 ENCOUNTER — Encounter: Payer: Self-pay | Admitting: Internal Medicine

## 2015-02-02 ENCOUNTER — Ambulatory Visit (INDEPENDENT_AMBULATORY_CARE_PROVIDER_SITE_OTHER)
Admission: RE | Admit: 2015-02-02 | Discharge: 2015-02-02 | Disposition: A | Discharge status: Home / Self Care | Payer: Medicare Other | Source: Ambulatory Visit | Attending: Internal Medicine | Admitting: Internal Medicine

## 2015-02-02 ENCOUNTER — Ambulatory Visit (INDEPENDENT_AMBULATORY_CARE_PROVIDER_SITE_OTHER): Payer: Medicare Other | Admitting: Internal Medicine

## 2015-02-02 VITALS — BP 140/90 | HR 92 | Temp 98.1°F | Wt 291.0 lb

## 2015-02-02 DIAGNOSIS — I5032 Chronic diastolic (congestive) heart failure: Secondary | ICD-10-CM | POA: Insufficient documentation

## 2015-02-02 DIAGNOSIS — I509 Heart failure, unspecified: Secondary | ICD-10-CM

## 2015-02-02 LAB — CBC WITH DIFFERENTIAL/PLATELET
BASOS ABS: 0.1 10*3/uL (ref 0.0–0.1)
BASOS PCT: 0.6 % (ref 0.0–3.0)
EOS ABS: 0.4 10*3/uL (ref 0.0–0.7)
Eosinophils Relative: 4 % (ref 0.0–5.0)
HCT: 39.8 % (ref 39.0–52.0)
Hemoglobin: 12.9 g/dL — ABNORMAL LOW (ref 13.0–17.0)
LYMPHS ABS: 5 10*3/uL — AB (ref 0.7–4.0)
Lymphocytes Relative: 53.1 % — ABNORMAL HIGH (ref 12.0–46.0)
MCHC: 32.3 g/dL (ref 30.0–36.0)
MCV: 87.6 fl (ref 78.0–100.0)
MONO ABS: 0.7 10*3/uL (ref 0.1–1.0)
Monocytes Relative: 7.4 % (ref 3.0–12.0)
NEUTROS ABS: 3.3 10*3/uL (ref 1.4–7.7)
NEUTROS PCT: 34.9 % — AB (ref 43.0–77.0)
PLATELETS: 250 10*3/uL (ref 150.0–400.0)
RBC: 4.55 Mil/uL (ref 4.22–5.81)
RDW: 14.7 % (ref 11.5–15.5)
WBC: 9.5 10*3/uL (ref 4.0–10.5)

## 2015-02-02 LAB — COMPREHENSIVE METABOLIC PANEL
ALK PHOS: 56 U/L (ref 39–117)
ALT: 29 U/L (ref 0–53)
AST: 23 U/L (ref 0–37)
Albumin: 3.5 g/dL (ref 3.5–5.2)
BILIRUBIN TOTAL: 0.3 mg/dL (ref 0.2–1.2)
BUN: 19 mg/dL (ref 6–23)
CO2: 30 meq/L (ref 19–32)
CREATININE: 1.31 mg/dL (ref 0.40–1.50)
Calcium: 9.6 mg/dL (ref 8.4–10.5)
Chloride: 105 mEq/L (ref 96–112)
GFR: 70.39 mL/min (ref >60.00)
GLUCOSE: 96 mg/dL (ref 70–99)
Potassium: 4.1 mEq/L (ref 3.5–5.1)
Sodium: 141 mEq/L (ref 135–145)
TOTAL PROTEIN: 8 g/dL (ref 6.0–8.3)

## 2015-02-02 LAB — TROPONIN I: TNIDX: 0.01 ug/l (ref 0.00–0.06)

## 2015-02-02 MED ORDER — FUROSEMIDE 40 MG PO TABS: 40.0000 mg | ORAL_TABLET | Freq: Every day | ORAL | Status: DC

## 2015-02-02 NOTE — Progress Notes (Signed)
Subjective:    Patient ID: Adam [name], male    DOB: 12/23/1950, 64 y.o.   MRN: [id number]  HPI Here due to swelling  Started 2-3 weeks ago Couldn't fit tennis shoes on Legs are aching and tingling Gained weight in abdomen--- pushing and making it hard to breathe  Swelling worse on left side Slight increased trouble breathing Sleeps in bed---having some PND now No chest pain May have felt heart fast once No dizziness or syncope  Current Outpatient Prescriptions on File Prior to Visit  Medication Sig Dispense Refill  . aspirin 81 MG tablet Take 81 mg by mouth daily.      Marland Kitchen atorvastatin (LIPITOR) 80 MG tablet TAKE ONE TABLET BY MOUTH ONCE DAILY 90 tablet 2  . B-D INS SYR MICROFINE 1CC/28G 28G X 1/2" 1 ML MISC INJECT 100 UNITS SQ AS DIRECTED TWICE DAILY 200 each 0  . glipiZIDE (GLUCOTROL) 10 MG tablet TAKE ONE TABLET BY MOUTH ONCE DAILY 90 tablet 3  . insulin aspart protamine- aspart (NOVOLOG MIX 70/30) (70-30) 100 UNIT/ML injection INJECT 100 UNITS UNDER THE SKIN TWICE A DAY WITH MEALS 20 vial 11  . lisinopril-hydrochlorothiazide (PRINZIDE,ZESTORETIC) 20-25 MG tablet TAKE ONE TABLET BY MOUTH ONCE DAILY 90 tablet 3  . metFORMIN (GLUCOPHAGE) 1000 MG tablet TAKE ONE TABLET BY MOUTH TWICE DAILY WITH MEALS 180 tablet 3   No current facility-administered medications on file prior to visit.    Allergies  Allergen Reactions  . Cephalexin Rash    Past Medical History  Diagnosis Date  . History of colonic polyps     Hyperplastic  . Diabetes mellitus type II 2002    Hospitalized for very high sugars  . Diverticulosis of colon   . GERD (gastroesophageal reflux disease)   . Hypertension   . Phimosis 2003    Repair  . Venous insufficiency   . Hyperlipidemia   . Cataract     Past Surgical History  Procedure Laterality Date  . Hydrocele excision / repair  10/07    [name])    Family History  Problem Relation Age of Onset  . Diabetes Mother   . Hypertension Mother     . Diabetes Father   . Mental illness Brother     Hx of schizophrenia  . Diabetes Brother   . Hypertension Brother   . Colon cancer Neg Hx     Social History   Social History  . Marital Status: Married    Spouse Name: N/A  . Number of Children: 3  . Years of Education: N/A   Occupational History  . 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 Smoking status: Former Smoker -- 37 years    Types: Cigarettes    Quit date: 02/24/1998  . Smokeless tobacco: Never Used  . Alcohol Use: No  . Drug Use: No  . Sexual Activity: Not on file   Other Topics Concern  . Not on file   Social History Narrative   No living will   Requests wife, then 3 daughter, to make health care decisions   Would accept resuscitation   Not sure about tube feeds--but might consider   Review of Systems  No N/V ?slight yellow in eyes No change in bowels---occasionally strains     Objective:   Physical Exam  Constitutional: No distress.  Increased RR when supine  Neck: Normal range of motion. No JVD present. No  thyromegaly present.  Cardiovascular: Normal rate, regular rhythm, normal heart sounds and intact distal pulses.  Exam reveals no gallop and no friction rub.   No murmur heard. Pulmonary/Chest: Effort normal. He has no wheezes. He has no rales.  Decreased breath sounds with ?dullness at bases  Abdominal: Soft. He exhibits no distension. There is no tenderness. There is no rebound and no guarding.  Musculoskeletal:  1-2+ tense edema up legs and fullness of skin throughout body  Lymphadenopathy:    He has no cervical adenopathy.          Assessment & Plan:

## 2015-02-02 NOTE — Progress Notes (Signed)
Pre visit review using our clinic review tool, if applicable. No additional management support is needed unless otherwise documented below in the visit note. 

## 2015-02-02 NOTE — Assessment & Plan Note (Addendum)
New CHF syndrome Will need to check EF as well as renal/hepatic function EKG largely unchanged since 2007--but does have more pronounced T wave inversions in lateral leads Will check CPK and troponin---to ER if positive Check echo Start furosemide

## 2015-02-02 NOTE — Patient Instructions (Signed)
Please start the new medication --furosemide-- today. Start with one tab daily. You should void a lot after taking it and your weight should go down (weigh yourself now and then every morning). If you are not getting rid of fluid-- increase to 2 tabs daily (at the same time).

## 2015-02-03 LAB — CK TOTAL AND CKMB (NOT AT ARMC): Total CK: 377 U/L — ABNORMAL HIGH (ref 7–232)

## 2015-02-07 ENCOUNTER — Encounter: Payer: Self-pay | Admitting: Internal Medicine

## 2015-02-07 ENCOUNTER — Ambulatory Visit (INDEPENDENT_AMBULATORY_CARE_PROVIDER_SITE_OTHER): Payer: Medicare Other | Admitting: Internal Medicine

## 2015-02-07 VITALS — BP 130/80 | HR 88 | Temp 97.6°F | Wt 286.0 lb

## 2015-02-07 DIAGNOSIS — I509 Heart failure, unspecified: Secondary | ICD-10-CM

## 2015-02-07 LAB — RENAL FUNCTION PANEL
ALBUMIN: 3.4 g/dL — AB (ref 3.5–5.2)
BUN: 22 mg/dL (ref 6–23)
CHLORIDE: 100 meq/L (ref 96–112)
CO2: 31 meq/L (ref 19–32)
CREATININE: 1.44 mg/dL (ref 0.40–1.50)
Calcium: 9.3 mg/dL (ref 8.4–10.5)
GFR: 63.11 mL/min (ref >60.00)
Glucose, Bld: 241 mg/dL — ABNORMAL HIGH (ref 70–99)
Phosphorus: 3.9 mg/dL (ref 2.3–4.6)
Potassium: 3.7 mEq/L (ref 3.5–5.1)
SODIUM: 137 meq/L (ref 135–145)

## 2015-02-07 NOTE — Assessment & Plan Note (Signed)
Improved I believe his dry weight should be 270-275# at home Has been losing an additional pound a day at home---will not change the furosemide for now Echo scheduled 12/29  Instructions given

## 2015-02-07 NOTE — Progress Notes (Signed)
Pre visit review using our clinic review tool, if applicable. No additional management support is needed unless otherwise documented below in the visit note. 

## 2015-02-07 NOTE — Patient Instructions (Signed)
Continue the furosemide 40mg  every day. If you are still over 275# at home next week, I will increase to 80mg  daily of the furosemide (you can take 2 of the 40mg  tabs and call and I will send a new prescription).

## 2015-02-07 NOTE — Progress Notes (Signed)
Subjective:    Patient ID: NASHON WILLHOITE, male    DOB: October 14, 1950, 64 y.o.   MRN: [id number]  HPI  Here for follow up of new congestive heart failure  Feels some better Feet still swollen Some aching in wrist  Able to sleep in bed---no PND No chest pain No palpitations No dizziness  Current Outpatient Prescriptions on File Prior to Visit  Medication Sig Dispense Refill  . aspirin 81 MG tablet Take 81 mg by mouth daily.      Marland Kitchen atorvastatin (LIPITOR) 80 MG tablet TAKE ONE TABLET BY MOUTH ONCE DAILY 90 tablet 2  . B-D INS SYR MICROFINE 1CC/28G 28G X 1/2" 1 ML MISC INJECT 100 UNITS SQ AS DIRECTED TWICE DAILY 200 each 0  . furosemide (LASIX) 40 MG tablet Take 1-2 tablets (40-80 mg total) by mouth daily. 60 tablet 3  . glipiZIDE (GLUCOTROL) 10 MG tablet TAKE ONE TABLET BY MOUTH ONCE DAILY 90 tablet 3  . insulin aspart protamine- aspart (NOVOLOG MIX 70/30) (70-30) 100 UNIT/ML injection INJECT 100 UNITS UNDER THE SKIN TWICE A DAY WITH MEALS 20 vial 11  . lisinopril-hydrochlorothiazide (PRINZIDE,ZESTORETIC) 20-25 MG tablet TAKE ONE TABLET BY MOUTH ONCE DAILY 90 tablet 3  . metFORMIN (GLUCOPHAGE) 1000 MG tablet TAKE ONE TABLET BY MOUTH TWICE DAILY WITH MEALS 180 tablet 3   No current facility-administered medications on file prior to visit.    Allergies  Allergen Reactions  . Cephalexin Rash    Past Medical History  Diagnosis Date  . History of colonic polyps     Hyperplastic  . Diabetes mellitus type II 2002    Hospitalized for very high sugars  . Diverticulosis of colon   . GERD (gastroesophageal reflux disease)   . Hypertension   . Phimosis 2003    Repair  . Venous insufficiency   . Hyperlipidemia   . Cataract     Past Surgical History  Procedure Laterality Date  . Hydrocele excision / repair  10/07    [name])    Family History  Problem Relation Age of Onset  . Diabetes Mother   . Hypertension Mother   . Diabetes Father   . Mental illness Brother     Hx  of schizophrenia  . Diabetes Brother   . Hypertension Brother   . Colon cancer Neg Hx     Social History   Social History  . Marital Status: Married    Spouse Name: N/A  . Number of Children: 3  . Years of Education: N/A   Occupational History  . 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 Smoking status: Former Smoker -- 37 years    Types: Cigarettes    Quit date: 02/24/1998  . Smokeless tobacco: Never Used  . Alcohol Use: No  . Drug Use: No  . Sexual Activity: Not on file   Other Topics Concern  . Not on file   Social History Narrative   No living will   Requests wife, then 3 daughter, to make health care decisions   Would accept resuscitation   Not sure about tube feeds--but might consider   Review of Systems  Has been weighing at home--forgot his sheet Lost 5-8# there  Appetite is okay     Objective:   Physical Exam  Constitutional: He appears well-developed. No distress.  Cardiovascular: Normal rate, regular rhythm and normal heart sounds.  Exam reveals no gallop.  No murmur heard. Pulmonary/Chest: Effort normal and breath sounds normal. No respiratory distress. He has no wheezes. He has no rales.  Musculoskeletal:  Still mild foot and calf edema          Assessment & Plan:

## 2015-02-09 ENCOUNTER — Encounter: Payer: Self-pay | Admitting: *Deleted

## 2015-02-22 ENCOUNTER — Ambulatory Visit (INDEPENDENT_AMBULATORY_CARE_PROVIDER_SITE_OTHER): Payer: Medicare Other

## 2015-02-22 ENCOUNTER — Other Ambulatory Visit: Payer: Self-pay

## 2015-02-22 DIAGNOSIS — I509 Heart failure, unspecified: Secondary | ICD-10-CM

## 2015-03-06 LAB — HEMOGLOBIN A1C: Hemoglobin A1C: 10.1

## 2015-03-07 ENCOUNTER — Encounter: Payer: Self-pay | Admitting: Internal Medicine

## 2015-03-07 ENCOUNTER — Ambulatory Visit (INDEPENDENT_AMBULATORY_CARE_PROVIDER_SITE_OTHER): Payer: Medicare Other | Admitting: Internal Medicine

## 2015-03-07 VITALS — BP 140/80 | HR 83 | Temp 97.5°F | Wt 292.0 lb

## 2015-03-07 DIAGNOSIS — Z794 Long term (current) use of insulin: Secondary | ICD-10-CM | POA: Diagnosis not present

## 2015-03-07 DIAGNOSIS — IMO0002 Reserved for concepts with insufficient information to code with codable children: Secondary | ICD-10-CM

## 2015-03-07 DIAGNOSIS — E1165 Type 2 diabetes mellitus with hyperglycemia: Secondary | ICD-10-CM | POA: Diagnosis not present

## 2015-03-07 DIAGNOSIS — E1140 Type 2 diabetes mellitus with diabetic neuropathy, unspecified: Secondary | ICD-10-CM

## 2015-03-07 DIAGNOSIS — I5032 Chronic diastolic (congestive) heart failure: Secondary | ICD-10-CM

## 2015-03-07 MED ORDER — FUROSEMIDE 80 MG PO TABS: 80.0000 mg | ORAL_TABLET | Freq: Every day | ORAL | Status: DC

## 2015-03-07 NOTE — Assessment & Plan Note (Signed)
Fortunately EF normal on echo Still holding fluid but symptoms better Will continue the 80mg  daily of furosemide--but increase to bid if weight goes up

## 2015-03-07 NOTE — Patient Instructions (Signed)
Check your weight every morning. Let me know if it goes up by more than 3# from where it is now

## 2015-03-07 NOTE — Assessment & Plan Note (Signed)
Now seeing Dr Gabriel Carina Much better when checked yesterday

## 2015-03-07 NOTE — Progress Notes (Signed)
Subjective:    Patient ID: Adam [name], male    DOB: 21-Sep-1950, 65 y.o.   MRN: [id number]  HPI Here for follow up of CHF He feels better though he noted increased swelling in his feet last night Breathing is better Able to lie in bed without dyspnea and no PND No problems with activity--- does walk at [facility] and doing well with this  No chest pain No SOB  Now seeing Dr Gabriel Carina 2nd visit yesterday A1c down to 10.1%! Has had to cut his insulin due to low sugar reactions--she has counseled him about his intake  Current Outpatient Prescriptions on File Prior to Visit  Medication Sig Dispense Refill  . aspirin 81 MG tablet Take 81 mg by mouth daily.      Marland Kitchen atorvastatin (LIPITOR) 80 MG tablet TAKE ONE TABLET BY MOUTH ONCE DAILY 90 tablet 2  . B-D INS SYR MICROFINE 1CC/28G 28G X 1/2" 1 ML MISC INJECT 100 UNITS SQ AS DIRECTED TWICE DAILY 200 each 0  . glipiZIDE (GLUCOTROL) 10 MG tablet TAKE ONE TABLET BY MOUTH ONCE DAILY 90 tablet 3  . lisinopril-hydrochlorothiazide (PRINZIDE,ZESTORETIC) 20-25 MG tablet TAKE ONE TABLET BY MOUTH ONCE DAILY 90 tablet 3  . metFORMIN (GLUCOPHAGE) 1000 MG tablet TAKE ONE TABLET BY MOUTH TWICE DAILY WITH MEALS 180 tablet 3   No current facility-administered medications on file prior to visit.    Allergies  Allergen Reactions  . Cephalexin Rash    Past Medical History  Diagnosis Date  . History of colonic polyps     Hyperplastic  . Diabetes mellitus type II 2002    Hospitalized for very high sugars  . Diverticulosis of colon   . GERD (gastroesophageal reflux disease)   . Hypertension   . Phimosis 2003    Repair  . Venous insufficiency   . Hyperlipidemia   . Cataract     Past Surgical History  Procedure Laterality Date  . Hydrocele excision / repair  10/07    [name])    Family History  Problem Relation Age of Onset  . Diabetes Mother   . Hypertension Mother   . Diabetes Father   . Mental illness Brother     Hx of  schizophrenia  . Diabetes Brother   . Hypertension Brother   . Colon cancer Neg Hx     Social History   Social History  . Marital Status: Married    Spouse Name: N/A  . Number of Children: 3  . Years of Education: N/A   Occupational History  . 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 Smoking status: Former Smoker -- 37 years    Types: Cigarettes    Quit date: 02/24/1998  . Smokeless tobacco: Never Used  . Alcohol Use: No  . Drug Use: No  . Sexual Activity: Not on file   Other Topics Concern  . Not on file   Social History Narrative   No living will   Requests wife, then 3 daughter, to make health care decisions   Would accept resuscitation   Not sure about tube feeds--but might consider   Review of Systems  Sleeps okay Appetite is okay     Objective:   Physical Exam  Constitutional: He appears well-developed and well-nourished. No distress.  Neck: Normal range of motion. Neck supple.  Cardiovascular: Normal rate and regular rhythm.  Exam reveals no gallop.  No murmur heard. Pulmonary/Chest: Effort normal and breath sounds normal. No respiratory distress. He has no wheezes. He has no rales.  No dullness  Musculoskeletal:  1+ non pitting edema Seems to have fluid in chest wall, etc  Lymphadenopathy:    He has no cervical adenopathy.  Skin:  No foot lesions          Assessment & Plan:

## 2015-03-08 ENCOUNTER — Telehealth: Payer: Self-pay | Admitting: Internal Medicine

## 2015-03-08 MED ORDER — FUROSEMIDE 80 MG PO TABS: 80.0000 mg | ORAL_TABLET | Freq: Every day | ORAL | Status: DC

## 2015-03-08 NOTE — Telephone Encounter (Signed)
rx sent to pharmacy by e-script  

## 2015-03-08 NOTE — Telephone Encounter (Signed)
Pt told dr Silvio Pate wrong pharmacy His fluid pill needs to [facility]   [phone]  Please let pt know when this is called

## 2015-05-01 ENCOUNTER — Encounter: Payer: Self-pay | Admitting: Internal Medicine

## 2015-05-01 ENCOUNTER — Ambulatory Visit (INDEPENDENT_AMBULATORY_CARE_PROVIDER_SITE_OTHER): Payer: Medicare Other | Admitting: Internal Medicine

## 2015-05-01 VITALS — BP 128/76 | HR 81 | Temp 98.2°F | Wt 292.5 lb

## 2015-05-01 DIAGNOSIS — E1165 Type 2 diabetes mellitus with hyperglycemia: Secondary | ICD-10-CM

## 2015-05-01 DIAGNOSIS — I10 Essential (primary) hypertension: Secondary | ICD-10-CM | POA: Diagnosis not present

## 2015-05-01 DIAGNOSIS — E785 Hyperlipidemia, unspecified: Secondary | ICD-10-CM | POA: Diagnosis not present

## 2015-05-01 DIAGNOSIS — IMO0002 Reserved for concepts with insufficient information to code with codable children: Secondary | ICD-10-CM

## 2015-05-01 DIAGNOSIS — E1140 Type 2 diabetes mellitus with diabetic neuropathy, unspecified: Secondary | ICD-10-CM

## 2015-05-01 DIAGNOSIS — G479 Sleep disorder, unspecified: Secondary | ICD-10-CM | POA: Insufficient documentation

## 2015-05-01 DIAGNOSIS — Z794 Long term (current) use of insulin: Secondary | ICD-10-CM

## 2015-05-01 DIAGNOSIS — I5032 Chronic diastolic (congestive) heart failure: Secondary | ICD-10-CM

## 2015-05-01 LAB — LIPID PANEL
CHOL/HDL RATIO: 4
Cholesterol: 175 mg/dL (ref 0–200)
HDL: 41.1 mg/dL (ref >39.00)
LDL CALC: 103 mg/dL — AB (ref 0–99)
NONHDL: 133.63
Triglycerides: 154 mg/dL — ABNORMAL HIGH (ref 0.0–149.0)
VLDL: 30.8 mg/dL (ref 0.0–40.0)

## 2015-05-01 LAB — RENAL FUNCTION PANEL
ALBUMIN: 3.6 g/dL (ref 3.5–5.2)
BUN: 36 mg/dL — ABNORMAL HIGH (ref 6–23)
CALCIUM: 9.4 mg/dL (ref 8.4–10.5)
CHLORIDE: 104 meq/L (ref 96–112)
CO2: 27 meq/L (ref 19–32)
Creatinine, Ser: 1.48 mg/dL (ref 0.40–1.50)
GFR: 61.1 mL/min (ref >60.00)
Glucose, Bld: 206 mg/dL — ABNORMAL HIGH (ref 70–99)
POTASSIUM: 4.3 meq/L (ref 3.5–5.1)
Phosphorus: 3.5 mg/dL (ref 2.3–4.6)
Sodium: 140 mEq/L (ref 135–145)

## 2015-05-01 NOTE — Patient Instructions (Signed)
If your sleep gets worse, you can try over the counter melatonin--- 3mg  or even 6 mg (if the 3mg  is not enough).

## 2015-05-01 NOTE — Progress Notes (Signed)
Pre visit review using our clinic review tool, if applicable. No additional management support is needed unless otherwise documented below in the visit note. 

## 2015-05-01 NOTE — Assessment & Plan Note (Signed)
Much improved with Dr Joycie Peek help

## 2015-05-01 NOTE — Progress Notes (Signed)
Subjective:    Patient ID: Adam [name], male    DOB: January 03, 1951, 65 y.o.   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 aspirin 81 MG tablet Take 81 mg by mouth daily.      Marland Kitchen atorvastatin (LIPITOR) 80 MG tablet TAKE ONE TABLET BY MOUTH ONCE DAILY 90 tablet 2  . B-D INS SYR MICROFINE 1CC/28G 28G X 1/2" 1 ML MISC INJECT 100 UNITS SQ AS DIRECTED TWICE DAILY 200 each 0  . furosemide (LASIX) 80 MG tablet Take 1 tablet (80 mg total) by mouth daily. 90 tablet 3  . glipiZIDE (GLUCOTROL) 10 MG tablet TAKE ONE TABLET BY MOUTH ONCE DAILY 90 tablet 3  . insulin lispro protamine-lispro (HUMALOG 75/25 MIX) (75-25) 100 UNIT/ML SUSP injection Take 100 units twice daily. Take 10 minutes before morning and evening meal.    . lisinopril-hydrochlorothiazide (PRINZIDE,ZESTORETIC) 20-25 MG tablet TAKE ONE TABLET BY MOUTH ONCE DAILY 90 tablet 3  . metFORMIN (GLUCOPHAGE) 1000 MG tablet TAKE ONE TABLET BY MOUTH TWICE DAILY WITH MEALS 180 tablet 3   No current facility-administered medications on file prior to visit.    Allergies  Allergen Reactions  . Cephalexin Rash    Past Medical History  Diagnosis Date  . History of colonic polyps     Hyperplastic  . Diabetes mellitus type II 2002    Hospitalized for very high sugars  . Diverticulosis  of colon   . GERD (gastroesophageal reflux disease)   . Hypertension   . Phimosis 2003    Repair  . Venous insufficiency   . Hyperlipidemia   . Cataract     Past Surgical History  Procedure Laterality Date  . Hydrocele excision / repair  10/07    [name])    Family History  Problem Relation Age of Onset  . Diabetes Mother   . Hypertension Mother   . Diabetes Father   . Mental illness Brother     Hx of schizophrenia  . Diabetes Brother   . Hypertension Brother   . Colon cancer Neg Hx     Social History   Social History  . Marital Status: Married    Spouse Name: N/A  . Number of Children: 3  . Years of Education: N/A   Occupational History  . 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 Smoking status: Former Smoker -- 37 years    Types: Cigarettes    Quit date: 02/24/1998  . Smokeless tobacco: Never Used  . Alcohol Use: No  . Drug Use: No  . Sexual Activity: Not on file   Other Topics Concern  . Not on file   Social History Narrative   No living will   Requests wife, then 3 daughter,  to make health care decisions   Would accept resuscitation   Not sure about tube feeds--but might consider   Review of Systems Weight has stabilized Feet/ankle edema is resolved Appetite is fine Is back taking the atorvastatin    Objective:   Physical Exam  Constitutional: He appears well-nourished. No distress.  Neck: Normal range of motion. Neck supple. No thyromegaly present.  Cardiovascular: Normal rate, regular rhythm, normal heart sounds and intact distal pulses.  Exam reveals no gallop.   No murmur heard. Pulmonary/Chest: Effort normal and breath sounds normal. No respiratory distress. He has no wheezes. He has no rales.  Musculoskeletal: He exhibits no edema or tenderness.  Lymphadenopathy:    He has no cervical adenopathy.  Skin: No rash noted. No erythema.  No foot lesions  Psychiatric: He has a normal mood and  affect. His behavior is normal.          Assessment & Plan:

## 2015-05-01 NOTE — Assessment & Plan Note (Signed)
Taking the statin regularly Will recheck lab

## 2015-05-01 NOTE — Assessment & Plan Note (Signed)
BP Readings from Last 3 Encounters:  05/01/15 128/76  03/07/15 140/80  02/07/15 130/80   Good control

## 2015-05-01 NOTE — Assessment & Plan Note (Signed)
Weight is stable but up from a year ago--may be related to increased insulin Fluid status seems fine Will recheck renal profile

## 2015-05-01 NOTE — Assessment & Plan Note (Signed)
Stay away from meds If worsens, can try melatonin

## 2015-05-08 ENCOUNTER — Ambulatory Visit: Payer: Medicare Other | Admitting: Internal Medicine

## 2015-06-06 ENCOUNTER — Encounter: Payer: Self-pay | Admitting: Gastroenterology

## 2015-06-27 ENCOUNTER — Telehealth: Payer: Self-pay | Admitting: Internal Medicine

## 2015-06-27 NOTE — Telephone Encounter (Signed)
Last OV 05-01-15. Forms in Dr Adam [name] InBox to decide if he can fill them out without an OV

## 2015-06-27 NOTE — Telephone Encounter (Signed)
Pt dropped off DMV ppw for completion by PCP, placed in rx tower for CMA.  Best number to call pt when ppw completed is [phone] (spouse) or [phone]

## 2015-06-28 NOTE — Telephone Encounter (Signed)
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

## 2015-06-28 NOTE — Telephone Encounter (Signed)
I spoke with patient's wife and she scheduled appointment on 07/04/15 at 8:00.

## 2015-06-28 NOTE — Telephone Encounter (Signed)
Adam [name]  Will take care of the forms then---probably should be okay for him to drive despite the insulin--hasn't had trouble with hypoglycemia

## 2015-07-04 ENCOUNTER — Ambulatory Visit (INDEPENDENT_AMBULATORY_CARE_PROVIDER_SITE_OTHER): Payer: Medicare Other | Admitting: Internal Medicine

## 2015-07-04 ENCOUNTER — Encounter: Payer: Self-pay | Admitting: Internal Medicine

## 2015-07-04 VITALS — BP 128/86 | HR 83 | Temp 98.1°F | Wt 289.0 lb

## 2015-07-04 DIAGNOSIS — E1140 Type 2 diabetes mellitus with diabetic neuropathy, unspecified: Secondary | ICD-10-CM

## 2015-07-04 DIAGNOSIS — IMO0002 Reserved for concepts with insufficient information to code with codable children: Secondary | ICD-10-CM

## 2015-07-04 DIAGNOSIS — E1165 Type 2 diabetes mellitus with hyperglycemia: Secondary | ICD-10-CM | POA: Diagnosis not present

## 2015-07-04 DIAGNOSIS — Z794 Long term (current) use of insulin: Secondary | ICD-10-CM

## 2015-07-04 NOTE — Assessment & Plan Note (Signed)
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

## 2015-07-04 NOTE — Progress Notes (Signed)
Pre visit review using our clinic review tool, if applicable. No additional management support is needed unless otherwise documented below in the visit note. 

## 2015-07-04 NOTE — Progress Notes (Signed)
Subjective:    Patient ID: Adam [name], male    DOB: 06/26/1950, 65 y.o.   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 aspirin 81 MG tablet Take 81 mg by mouth daily.      Marland Kitchen atorvastatin (LIPITOR) 80 MG tablet TAKE ONE TABLET BY MOUTH ONCE DAILY 90 tablet 2  . B-D INS SYR MICROFINE 1CC/28G 28G X 1/2" 1 ML MISC INJECT 100 UNITS SQ AS DIRECTED TWICE DAILY 200 each 0  . furosemide (LASIX) 80 MG tablet Take 1 tablet (80 mg total) by mouth daily. 90 tablet 3  . glipiZIDE (GLUCOTROL) 10 MG tablet TAKE ONE TABLET BY MOUTH ONCE DAILY 90 tablet 3  . insulin lispro protamine-lispro (HUMALOG 75/25 MIX) (75-25) 100 UNIT/ML SUSP injection Take 100 units twice daily. Take 10 minutes before morning and evening meal.    . lisinopril-hydrochlorothiazide (PRINZIDE,ZESTORETIC) 20-25 MG tablet TAKE ONE TABLET BY MOUTH ONCE DAILY 90 tablet 3  . metFORMIN (GLUCOPHAGE) 1000 MG tablet TAKE ONE TABLET BY MOUTH TWICE DAILY WITH MEALS 180 tablet 3   No current facility-administered medications on file prior to visit.    Allergies  Allergen Reactions  . Cephalexin Rash    Past Medical History  Diagnosis Date  . History of colonic polyps     Hyperplastic  . Diabetes mellitus type II 2002    Hospitalized for very high sugars  . Diverticulosis of colon   . GERD (gastroesophageal reflux disease)   . Hypertension   . Phimosis 2003    Repair  . Venous insufficiency   .  Hyperlipidemia   . Cataract     Past Surgical History  Procedure Laterality Date  . Hydrocele excision / repair  10/07    [name])    Family History  Problem Relation Age of Onset  . Diabetes Mother   . Hypertension Mother   . Diabetes Father   . Mental illness Brother     Hx of schizophrenia  . Diabetes Brother   . Hypertension Brother   . Colon cancer Neg Hx     Social History   Social History  . Marital Status: Married    Spouse Name: N/A  . Number of Children: 3  . Years of Education: N/A   Occupational History  . 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 Smoking status: Former Smoker -- 37 years    Types: Cigarettes    Quit date: 02/24/1998  . Smokeless tobacco: Never Used  . Alcohol Use: No  . Drug Use: No  . Sexual Activity: Not on file   Other Topics Concern  . Not on file   Social History Narrative   No living will   Requests wife, then 3 daughter, to make health care decisions   Would accept resuscitation   Not sure about tube feeds--but might consider    Review  of Systems     Objective:   Physical Exam        Assessment & Plan:

## 2015-07-11 ENCOUNTER — Telehealth: Payer: Self-pay | Admitting: Internal Medicine

## 2015-07-11 NOTE — Telephone Encounter (Signed)
Patient dropped off the [place] [employer] forms again because they are requested additional information from the doctor.  Form was put in the CMA's incoming box.

## 2015-07-11 NOTE — Telephone Encounter (Signed)
Forms placed back in Dr Alla German InBox on his desk

## 2015-07-11 NOTE — Telephone Encounter (Signed)
Dr Silvio Pate, I am at a loss. I had Emelia Salisbury look the form over with me and she did not see anything that was not done. I n speaking to the patient's wife, the only thing I can come up with is that questions 14 to 23 have yes/no questions with a space for comments. The statement to the physician says if you circle yes, you should address the questions. But, the questions are self explanatory, the wife agreed. I placed the form back in your InBox. I flagged the area I was talking about.

## 2015-07-11 NOTE — Telephone Encounter (Signed)
I thought the same thing when I looked the forms over before giving them to you.

## 2015-07-11 NOTE — Telephone Encounter (Signed)
I can't tell what needs to be done-- I don't know what 2-7 is and don't see any blank parts. Please find out what needs to be done

## 2015-07-12 NOTE — Telephone Encounter (Signed)
No--the yes answers require explanations only for the conditions noted. I don't see anything---unless they don't like the crossout We may need to send back with an explicit request for exactly what they are looking for

## 2015-07-18 NOTE — Telephone Encounter (Signed)
Great - thanks

## 2015-07-18 NOTE — Telephone Encounter (Signed)
I faxed back the form on 07/12/15 asking for an explanation of what needed to be filled out on the form.  I didn't receive a response back.  I called DMV today and they said the patient didn't fill out his part of the form.  I called patient and received the information that needed to be filled out on the form and faxed the form to [facility].

## 2015-07-31 ENCOUNTER — Other Ambulatory Visit: Payer: Self-pay

## 2015-07-31 MED ORDER — METFORMIN HCL 1000 MG PO TABS: 1000.0000 mg | ORAL_TABLET | Freq: Two times a day (BID) | ORAL | Status: DC

## 2015-07-31 MED ORDER — LISINOPRIL-HYDROCHLOROTHIAZIDE 20-25 MG PO TABS: 1.0000 | ORAL_TABLET | Freq: Every day | ORAL | Status: DC

## 2015-07-31 MED ORDER — GLIPIZIDE 10 MG PO TABS: 10.0000 mg | ORAL_TABLET | Freq: Every day | ORAL | Status: DC

## 2015-07-31 NOTE — Telephone Encounter (Signed)
Rx sent electronically.  

## 2015-08-31 ENCOUNTER — Telehealth: Payer: Self-pay

## 2015-08-31 NOTE — Telephone Encounter (Signed)
Patient is on the list for [facility] 2017 and may be a good candidate for an AWV in 2017. Please let me know if/when appt is scheduled.   

## 2015-09-17 ENCOUNTER — Other Ambulatory Visit: Payer: Self-pay | Admitting: Internal Medicine

## 2015-09-28 ENCOUNTER — Telehealth: Payer: Self-pay | Admitting: *Deleted

## 2015-09-28 NOTE — Telephone Encounter (Signed)
Form done No charge 

## 2015-09-28 NOTE — Telephone Encounter (Signed)
Form placed in Dr Letvak's InBox on his desk 

## 2015-09-28 NOTE — Telephone Encounter (Signed)
Patient's wife notified form is ready for pick up and no charge.

## 2015-09-28 NOTE — Telephone Encounter (Signed)
Mr. Adam [name] came in to have a form filled out for a job he is applying for. Please fill it out and call him [phone] or [phone]. Form placed in prescription tower.

## 2015-10-31 ENCOUNTER — Encounter: Payer: Self-pay | Admitting: Internal Medicine

## 2015-10-31 ENCOUNTER — Ambulatory Visit (INDEPENDENT_AMBULATORY_CARE_PROVIDER_SITE_OTHER): Payer: Medicare Other | Admitting: Internal Medicine

## 2015-10-31 VITALS — BP 140/92 | HR 79 | Temp 98.4°F | Wt 286.0 lb

## 2015-10-31 DIAGNOSIS — I10 Essential (primary) hypertension: Secondary | ICD-10-CM | POA: Diagnosis not present

## 2015-10-31 DIAGNOSIS — E1165 Type 2 diabetes mellitus with hyperglycemia: Secondary | ICD-10-CM

## 2015-10-31 DIAGNOSIS — E1140 Type 2 diabetes mellitus with diabetic neuropathy, unspecified: Secondary | ICD-10-CM

## 2015-10-31 DIAGNOSIS — E785 Hyperlipidemia, unspecified: Secondary | ICD-10-CM

## 2015-10-31 DIAGNOSIS — Z794 Long term (current) use of insulin: Secondary | ICD-10-CM

## 2015-10-31 DIAGNOSIS — Z23 Encounter for immunization: Secondary | ICD-10-CM

## 2015-10-31 DIAGNOSIS — IMO0002 Reserved for concepts with insufficient information to code with codable children: Secondary | ICD-10-CM

## 2015-10-31 DIAGNOSIS — I5032 Chronic diastolic (congestive) heart failure: Secondary | ICD-10-CM | POA: Diagnosis not present

## 2015-10-31 DIAGNOSIS — I872 Venous insufficiency (chronic) (peripheral): Secondary | ICD-10-CM

## 2015-10-31 LAB — RENAL FUNCTION PANEL
Albumin: 3.4 g/dL — ABNORMAL LOW (ref 3.5–5.2)
BUN: 24 mg/dL — ABNORMAL HIGH (ref 6–23)
CO2: 28 mEq/L (ref 19–32)
CREATININE: 1.27 mg/dL (ref 0.40–1.50)
Calcium: 9.2 mg/dL (ref 8.4–10.5)
Chloride: 104 mEq/L (ref 96–112)
GFR: 72.79 mL/min (ref >60.00)
GLUCOSE: 168 mg/dL — AB (ref 70–99)
PHOSPHORUS: 3.3 mg/dL (ref 2.3–4.6)
POTASSIUM: 4.2 meq/L (ref 3.5–5.1)
Sodium: 137 mEq/L (ref 135–145)

## 2015-10-31 LAB — HM DIABETES FOOT EXAM

## 2015-10-31 LAB — HEMOGLOBIN A1C: HEMOGLOBIN A1C: 10.5 % — AB (ref 4.6–6.5)

## 2015-10-31 NOTE — Assessment & Plan Note (Signed)
Doing okay with diuretic

## 2015-10-31 NOTE — Progress Notes (Signed)
Subjective:    Patient ID: Adam [name], male    DOB: [birth year], 65 y.o.   MRN: [id number]  HPI Here for follow up of diabetes and other chronic health conditions  Is now driving school bus for elementary students in [place] He really likes this  Checks sugars at times Running fairly well--fasting usually 100 Does get some "shaking"--may be early low sugar reaction (often in 80's and he will delay the insulin till he eats) Eating regularly--but often just bid  No chest pain No SOB No dizziness or syuncope Edema is better since on the fluid pill  Current Outpatient Prescriptions on File Prior to Visit  Medication Sig Dispense Refill  . aspirin 81 MG tablet Take 81 mg by mouth daily.      Marland Kitchen atorvastatin (LIPITOR) 80 MG tablet TAKE ONE TABLET BY MOUTH ONCE DAILY 90 tablet 2  . B-D INS SYR MICROFINE 1CC/28G 28G X 1/2" 1 ML MISC INJECT 100 UNITS SQ AS DIRECTED TWICE DAILY 200 each 0  . furosemide (LASIX) 80 MG tablet Take 1 tablet (80 mg total) by mouth daily. 90 tablet 3  . glipiZIDE (GLUCOTROL) 10 MG tablet Take 1 tablet (10 mg total) by mouth daily. 90 tablet 3  . insulin lispro protamine-lispro (HUMALOG 75/25 MIX) (75-25) 100 UNIT/ML SUSP injection Take 100 units twice daily. Take 10 minutes before morning and evening meal.    . lisinopril-hydrochlorothiazide (PRINZIDE,ZESTORETIC) 20-25 MG tablet Take 1 tablet by mouth daily. 90 tablet 3  . metFORMIN (GLUCOPHAGE) 1000 MG tablet Take 1 tablet (1,000 mg total) by mouth 2 (two) times daily with a meal. 180 tablet 3   No current facility-administered medications on file prior to visit.     Allergies  Allergen Reactions  . Cephalexin Rash    Past Medical History:  Diagnosis Date  . Cataract   . Diabetes mellitus type II 2002   Hospitalized for very high sugars  . Diverticulosis of colon   . GERD (gastroesophageal reflux disease)   . History of colonic polyps    Hyperplastic  . Hyperlipidemia   . Hypertension   .  Phimosis 2003   Repair  . Venous insufficiency     Past Surgical History:  Procedure Laterality Date  . HYDROCELE EXCISION / REPAIR  10/07   [facility])    Family History  Problem Relation Age of Onset  . Diabetes Mother   . Hypertension Mother   . Diabetes Father   . Mental illness Brother     Hx of schizophrenia  . Diabetes Brother   . Hypertension Brother   . Colon cancer Neg Hx     Social History   Social History  . Marital status: Married    Spouse name: N/A  . Number of children: 3  . Years of education: N/A   Occupational History  . 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 Smoking status: Former Smoker    Years: 37.00    Types: Cigarettes    Quit date: 02/24/1998  . Smokeless tobacco: Never Used  . Alcohol use No  . Drug use: No  . Sexual activity: Not on file   Other Topics Concern  . Not on file   Social History Narrative   No living will   Requests wife, then 3 daughter, to make health care decisions   Would accept resuscitation   Not sure about tube feeds--but might  consider   Review of Systems Some pain in left 3rd MCP lately Sleeps well Weight is stable No falls No depression or anhedonia    Objective:   Physical Exam  Constitutional: He appears well-developed and well-nourished. No distress.  Neck: Normal range of motion. Neck supple. No thyromegaly present.  Cardiovascular: Normal rate, regular rhythm, normal heart sounds and intact distal pulses.  Exam reveals no gallop.   No murmur heard. Pulmonary/Chest: Effort normal and breath sounds normal. No respiratory distress. He has no wheezes. He has no rales.  Abdominal: Soft. There is no tenderness.  Musculoskeletal:  Thick calves and feet without pitting  Lymphadenopathy:    He has no cervical adenopathy.  Neurological:  Decreased sensation in feet  Skin:  No foot lesions Small papule on top of head--doesn't look neoplastic but suggested  derm eval  Psychiatric: He has a normal mood and affect. His behavior is normal.          Assessment & Plan:

## 2015-10-31 NOTE — Progress Notes (Signed)
Pre visit review using our clinic review tool, if applicable. No additional management support is needed unless otherwise documented below in the visit note. 

## 2015-10-31 NOTE — Assessment & Plan Note (Signed)
Legs are some better--still thick

## 2015-10-31 NOTE — Assessment & Plan Note (Signed)
Seems to have tighter control Will check A1c Numbness in feet but pain is better--no Rx

## 2015-10-31 NOTE — Patient Instructions (Signed)
Please get your diabetic eye exam!! 

## 2015-10-31 NOTE — Assessment & Plan Note (Signed)
BP Readings from Last 3 Encounters:  10/31/15 (!) 140/92  07/04/15 128/86  05/01/15 128/76   Has been okay Will not change anything today

## 2015-10-31 NOTE — Assessment & Plan Note (Signed)
No problems with his statin Lab Results  Component Value Date   LDLCALC 103 (H) 05/01/2015

## 2015-10-31 NOTE — Addendum Note (Signed)
Addended by: Pilar Grammes on: 10/31/2015 12:23 PM   Modules accepted: Orders

## 2015-11-25 ENCOUNTER — Other Ambulatory Visit: Payer: Self-pay | Admitting: Internal Medicine

## 2016-01-02 LAB — HM DIABETES EYE EXAM

## 2016-01-04 LAB — FECAL OCCULT BLOOD, GUAIAC: Fecal Occult Blood: NEGATIVE

## 2016-01-08 ENCOUNTER — Encounter: Payer: Self-pay | Admitting: Internal Medicine

## 2016-01-21 ENCOUNTER — Telehealth: Payer: Self-pay

## 2016-01-21 MED ORDER — GLUCOSE BLOOD VI STRP: ORAL_STRIP | 12 refills | Status: DC

## 2016-01-21 NOTE — Telephone Encounter (Signed)
Patient's wife calls in to request a new order on his blood sugar test strips:  One Touch Ultra Verio.  He does test bid and is on insulin.    R/X sent in for #100 with 1 years refills to local pharmacy: [place] per wife's request.

## 2016-01-21 NOTE — Telephone Encounter (Signed)
Thanks

## 2016-02-15 ENCOUNTER — Other Ambulatory Visit: Payer: Self-pay | Admitting: Internal Medicine

## 2016-02-22 ENCOUNTER — Other Ambulatory Visit: Payer: Self-pay | Admitting: Internal Medicine

## 2016-04-01 ENCOUNTER — Encounter: Payer: Self-pay | Admitting: Internal Medicine

## 2016-04-20 ENCOUNTER — Other Ambulatory Visit: Payer: Self-pay | Admitting: Internal Medicine

## 2016-05-07 ENCOUNTER — Other Ambulatory Visit: Payer: Self-pay | Admitting: *Deleted

## 2016-05-07 MED ORDER — INSULIN LISPRO PROT & LISPRO (75-25 MIX) 100 UNIT/ML ~~LOC~~ SUSP: SUBCUTANEOUS | 2 refills | Status: DC

## 2016-05-09 ENCOUNTER — Telehealth: Payer: Self-pay

## 2016-05-09 MED ORDER — INSULIN LISPRO PROT & LISPRO (75-25 MIX) 100 UNIT/ML ~~LOC~~ SUSP: SUBCUTANEOUS | 1 refills | Status: DC

## 2016-05-09 NOTE — Telephone Encounter (Signed)
The insulin rx sent 05-07-16 was sent in for #10 vials. He gets #18 for 90 days. Needs a new rx sent with the remaining #8 that he needs. Let wife know when it is sent in.

## 2016-05-09 NOTE — Telephone Encounter (Signed)
Rx sent as requested.

## 2016-05-09 NOTE — Telephone Encounter (Signed)
Rx was sent for what was requested by pharmacy.

## 2016-05-13 ENCOUNTER — Encounter: Payer: Self-pay | Admitting: Internal Medicine

## 2016-05-13 ENCOUNTER — Encounter (INDEPENDENT_AMBULATORY_CARE_PROVIDER_SITE_OTHER): Payer: Self-pay

## 2016-05-13 ENCOUNTER — Ambulatory Visit (INDEPENDENT_AMBULATORY_CARE_PROVIDER_SITE_OTHER): Payer: Medicare Other | Admitting: Internal Medicine

## 2016-05-13 VITALS — BP 136/90 | HR 88 | Temp 97.4°F | Ht 71.0 in | Wt 273.0 lb

## 2016-05-13 DIAGNOSIS — Z0000 Encounter for general adult medical examination without abnormal findings: Secondary | ICD-10-CM

## 2016-05-13 DIAGNOSIS — E1165 Type 2 diabetes mellitus with hyperglycemia: Secondary | ICD-10-CM | POA: Diagnosis not present

## 2016-05-13 DIAGNOSIS — I10 Essential (primary) hypertension: Secondary | ICD-10-CM

## 2016-05-13 DIAGNOSIS — Z7189 Other specified counseling: Secondary | ICD-10-CM | POA: Diagnosis not present

## 2016-05-13 DIAGNOSIS — I5032 Chronic diastolic (congestive) heart failure: Secondary | ICD-10-CM | POA: Diagnosis not present

## 2016-05-13 DIAGNOSIS — Z794 Long term (current) use of insulin: Secondary | ICD-10-CM

## 2016-05-13 DIAGNOSIS — Z23 Encounter for immunization: Secondary | ICD-10-CM

## 2016-05-13 DIAGNOSIS — E1140 Type 2 diabetes mellitus with diabetic neuropathy, unspecified: Secondary | ICD-10-CM

## 2016-05-13 DIAGNOSIS — N400 Enlarged prostate without lower urinary tract symptoms: Secondary | ICD-10-CM

## 2016-05-13 DIAGNOSIS — IMO0002 Reserved for concepts with insufficient information to code with codable children: Secondary | ICD-10-CM

## 2016-05-13 LAB — CBC WITH DIFFERENTIAL/PLATELET
BASOS PCT: 0.8 % (ref 0.0–3.0)
Basophils Absolute: 0.1 10*3/uL (ref 0.0–0.1)
EOS ABS: 0.3 10*3/uL (ref 0.0–0.7)
Eosinophils Relative: 3.2 % (ref 0.0–5.0)
HCT: 36.3 % — ABNORMAL LOW (ref 39.0–52.0)
Hemoglobin: 11.7 g/dL — ABNORMAL LOW (ref 13.0–17.0)
LYMPHS PCT: 53.1 % — AB (ref 12.0–46.0)
Lymphs Abs: 4.5 10*3/uL — ABNORMAL HIGH (ref 0.7–4.0)
MCHC: 32.3 g/dL (ref 30.0–36.0)
MCV: 86.8 fl (ref 78.0–100.0)
MONOS PCT: 6.3 % (ref 3.0–12.0)
Monocytes Absolute: 0.5 10*3/uL (ref 0.1–1.0)
Neutro Abs: 3.1 10*3/uL (ref 1.4–7.7)
Neutrophils Relative %: 36.6 % — ABNORMAL LOW (ref 43.0–77.0)
Platelets: 256 10*3/uL (ref 150.0–400.0)
RBC: 4.18 Mil/uL — ABNORMAL LOW (ref 4.22–5.81)
RDW: 14.6 % (ref 11.5–15.5)
WBC: 8.5 10*3/uL (ref 4.0–10.5)

## 2016-05-13 LAB — COMPREHENSIVE METABOLIC PANEL
ALBUMIN: 3.3 g/dL — AB (ref 3.5–5.2)
ALK PHOS: 67 U/L (ref 39–117)
ALT: 28 U/L (ref 0–53)
AST: 24 U/L (ref 0–37)
BUN: 27 mg/dL — AB (ref 6–23)
CALCIUM: 9.7 mg/dL (ref 8.4–10.5)
CHLORIDE: 100 meq/L (ref 96–112)
CO2: 29 mEq/L (ref 19–32)
Creatinine, Ser: 1.33 mg/dL (ref 0.40–1.50)
GFR: 68.9 mL/min (ref >60.00)
Glucose, Bld: 294 mg/dL — ABNORMAL HIGH (ref 70–99)
POTASSIUM: 4.1 meq/L (ref 3.5–5.1)
Sodium: 136 mEq/L (ref 135–145)
TOTAL PROTEIN: 7.4 g/dL (ref 6.0–8.3)
Total Bilirubin: 0.3 mg/dL (ref 0.2–1.2)

## 2016-05-13 LAB — LIPID PANEL
Cholesterol: 201 mg/dL — ABNORMAL HIGH (ref 0–200)
HDL: 40.1 mg/dL (ref >39.00)
NonHDL: 160.88
Total CHOL/HDL Ratio: 5
Triglycerides: 242 mg/dL — ABNORMAL HIGH (ref 0.0–149.0)
VLDL: 48.4 mg/dL — AB (ref 0.0–40.0)

## 2016-05-13 LAB — LDL CHOLESTEROL, DIRECT: Direct LDL: 116 mg/dL

## 2016-05-13 LAB — T4, FREE: Free T4: 0.79 ng/dL (ref 0.60–1.60)

## 2016-05-13 LAB — HEMOGLOBIN A1C: HEMOGLOBIN A1C: 11.9 % — AB (ref 4.6–6.5)

## 2016-05-13 LAB — PSA: PSA: 1.66 ng/mL (ref 0.10–4.00)

## 2016-05-13 NOTE — Assessment & Plan Note (Signed)
Not a big issue

## 2016-05-13 NOTE — Progress Notes (Signed)
Pre visit review using our clinic review tool, if applicable. No additional management support is needed unless otherwise documented below in the visit note. 

## 2016-05-13 NOTE — Assessment & Plan Note (Signed)
I have personally reviewed the Medicare Annual Wellness questionnaire and have noted 1. The patient's medical and social history 2. Their use of alcohol, tobacco or illicit drugs 3. Their current medications and supplements 4. The patient's functional ability including ADL's, fall risks, home safety risks and hearing or visual             impairment. 5. Diet and physical activities 6. Evidence for depression or mood disorders  The patients weight, height, BMI and visual acuity have been recorded in the chart I have made referrals, counseling and provided education to the patient based review of the above and I have provided the pt with a written personalized care plan for preventive services.  I have provided you with a copy of your personalized plan for preventive services. Please take the time to review along with your updated medication list.  Recent fecal tests Discussed PSA--will check Working on fitness--has lost some weight Will update pneumovax

## 2016-05-13 NOTE — Progress Notes (Signed)
Subjective:    Patient ID: Adam [name], male    DOB: Oct 15, 1950, 66 y.o.   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 No pain  No chest pain No SOB No dizziness or syncope Edema is better now with furosemide No palpitations Doesn't check BP  Current Outpatient Prescriptions on File Prior to Visit  Medication Sig Dispense Refill  . aspirin 81 MG tablet Take 81 mg by mouth daily.      Marland Kitchen atorvastatin (LIPITOR) 80 MG tablet TAKE ONE TABLET BY MOUTH ONCE DAILY 90 tablet 2  . B-D INS SYR MICROFINE 1CC/28G 28G X 1/2" 1 ML MISC INJECT 100 UNITS SQ AS DIRECTED TWICE DAILY 200 each 0  . furosemide (LASIX) 80 MG tablet Take 1 tablet (80 mg total) by mouth daily. 90 tablet 3  . glipiZIDE (GLUCOTROL) 10 MG tablet Take 1 tablet (10 mg total) by mouth daily. 90 tablet 3  . glucose blood (ONETOUCH VERIO) test strip Use as instructed to check blood sugars twice daily. Dx code: E11.65, Z79.4, E11.65 Insulin dependent type 2 diabetes 100 each 12  . insulin lispro protamine-lispro (HUMALOG 75/25 MIX) (75-25) 100 UNIT/ML SUSP injection Take 100 units twice daily. Take 10 minutes before morning and evening meal. E11.42 18 vial 1  . lisinopril-hydrochlorothiazide (PRINZIDE,ZESTORETIC) 20-25 MG tablet Take 1 tablet  by mouth daily. 90 tablet 3  . metFORMIN (GLUCOPHAGE) 1000 MG tablet Take 1 tablet (1,000 mg total) by mouth 2 (two) times daily with a meal. 180 tablet 3   No current facility-administered medications on file prior to visit.     Allergies  Allergen Reactions  . Cephalexin Rash    Past Medical History:  Diagnosis Date  . Cataract   . Diabetes mellitus type II 2002   Hospitalized for very high sugars  . Diverticulosis of colon   . GERD (gastroesophageal reflux disease)   . History of colonic polyps    Hyperplastic  . Hyperlipidemia   . Hypertension   . Phimosis 2003   Repair  . Venous insufficiency     Past Surgical History:  Procedure Laterality Date  . HYDROCELE EXCISION / REPAIR  10/07   [facility])    Family History  Problem Relation Age of Onset  . Diabetes Mother   . Hypertension Mother   . Diabetes Father   . Mental illness Brother     Hx of schizophrenia  . Diabetes Brother   . Hypertension Brother   . Colon cancer Neg Hx     Social History   Social History  . Marital status: Married    Spouse name: N/A  . Number of children: 3  . Years of education: N/A   Occupational History  . 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  Smoking status: Former Smoker    Years: 37.00    Types: Cigarettes    Quit date: 02/24/1998  . Smokeless tobacco: Never Used  . Alcohol use No  . Drug use: No  . Sexual activity: Not on file   Other Topics Concern  . Not on file   Social History Narrative   No living will   Requests wife, then 3 daughter, to make health care decisions   Would accept resuscitation   Not sure about tube feeds--but might consider   Review of Systems Weight is down considerably-- eating better and edema down Sleeps okay. Flat in bed--no PND Appetite is good Wears seat belt Teeth okay--- keeps up with dentist Bowels are fine--no blood Voids okay--flow is okay No rash or skin ulcers Rare  indigestion--- no dysphagia. Uses tums (only a few times a year)    Objective:   Physical Exam  Constitutional: He is oriented to person, place, and time. He appears well-nourished. No distress.  HENT:  Mouth/Throat: Oropharynx is clear and moist. No oropharyngeal exudate.  Neck: No thyromegaly present.  Cardiovascular: Normal rate, regular rhythm, normal heart sounds and intact distal pulses.  Exam reveals no gallop.   No murmur heard. Pulmonary/Chest: Effort normal and breath sounds normal. No respiratory distress. He has no wheezes. He has no rales.  Abdominal: Soft. There is no tenderness.  Musculoskeletal: He exhibits no tenderness.  Trace ankle edema on left only  Lymphadenopathy:    He has no cervical adenopathy.  Neurological: He is alert and oriented to person, place, and time.  President--- "Milinda Pointer, Obama, daddy too" [phone]?? [id number] Recall 3/3  Skin: No rash noted. No erythema.  Dry skin at ankles but no foot lesions  Psychiatric: He has a normal mood and affect. His behavior is normal.          Assessment & Plan:

## 2016-05-13 NOTE — Addendum Note (Signed)
Addended by: Pilar Grammes on: 05/13/2016 11:48 AM   Modules accepted: Orders

## 2016-05-13 NOTE — Assessment & Plan Note (Signed)
Compensated now On the furosemide and ACEI

## 2016-05-13 NOTE — Assessment & Plan Note (Signed)
BP Readings from Last 3 Encounters:  05/13/16 136/90  10/31/15 (!) 140/92  07/04/15 128/86   Reasonable for him

## 2016-05-13 NOTE — Assessment & Plan Note (Addendum)
Still doing okay Goal for him is to get under 10% Only numbness in feet---doesn't need Rx

## 2016-05-13 NOTE — Assessment & Plan Note (Signed)
See social history Has blank forms 

## 2016-05-14 ENCOUNTER — Telehealth: Payer: Self-pay

## 2016-05-14 MED ORDER — GLIPIZIDE 10 MG PO TABS: 10.0000 mg | ORAL_TABLET | Freq: Two times a day (BID) | ORAL | 3 refills | Status: DC

## 2016-05-14 NOTE — Telephone Encounter (Signed)
Spoke to pt about labs. Increasing glipizide and sending new RX to [facility] per Pt

## 2016-08-13 ENCOUNTER — Other Ambulatory Visit: Payer: Self-pay | Admitting: Internal Medicine

## 2016-08-26 ENCOUNTER — Other Ambulatory Visit: Payer: Self-pay | Admitting: Internal Medicine

## 2016-09-04 ENCOUNTER — Other Ambulatory Visit: Payer: Self-pay | Admitting: Internal Medicine

## 2016-09-20 ENCOUNTER — Other Ambulatory Visit: Payer: Self-pay | Admitting: Internal Medicine

## 2016-11-08 ENCOUNTER — Other Ambulatory Visit: Payer: Self-pay | Admitting: Internal Medicine

## 2016-11-18 ENCOUNTER — Encounter: Payer: Self-pay | Admitting: Internal Medicine

## 2016-11-18 ENCOUNTER — Ambulatory Visit (INDEPENDENT_AMBULATORY_CARE_PROVIDER_SITE_OTHER): Payer: Medicare Other | Admitting: Internal Medicine

## 2016-11-18 VITALS — BP 138/82 | HR 91 | Temp 98.1°F | Wt 282.5 lb

## 2016-11-18 DIAGNOSIS — Z794 Long term (current) use of insulin: Secondary | ICD-10-CM

## 2016-11-18 DIAGNOSIS — E1165 Type 2 diabetes mellitus with hyperglycemia: Secondary | ICD-10-CM

## 2016-11-18 DIAGNOSIS — E1140 Type 2 diabetes mellitus with diabetic neuropathy, unspecified: Secondary | ICD-10-CM | POA: Diagnosis not present

## 2016-11-18 DIAGNOSIS — I5032 Chronic diastolic (congestive) heart failure: Secondary | ICD-10-CM

## 2016-11-18 DIAGNOSIS — Z23 Encounter for immunization: Secondary | ICD-10-CM

## 2016-11-18 DIAGNOSIS — I10 Essential (primary) hypertension: Secondary | ICD-10-CM | POA: Diagnosis not present

## 2016-11-18 DIAGNOSIS — IMO0002 Reserved for concepts with insufficient information to code with codable children: Secondary | ICD-10-CM

## 2016-11-18 LAB — HEMOGLOBIN A1C: Hgb A1c MFr Bld: 10.6 % — ABNORMAL HIGH (ref 4.6–6.5)

## 2016-11-18 MED ORDER — INSULIN LISPRO PROT & LISPRO (75-25 MIX) 100 UNIT/ML ~~LOC~~ SUSP: SUBCUTANEOUS | 0 refills | Status: DC

## 2016-11-18 NOTE — Assessment & Plan Note (Signed)
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

## 2016-11-18 NOTE — Assessment & Plan Note (Signed)
BP Readings from Last 3 Encounters:  11/18/16 138/82  05/13/16 136/90  10/31/15 (!) 140/92   Good control still

## 2016-11-18 NOTE — Assessment & Plan Note (Signed)
Discussed eating some breakfast and taking lower insulin dose No major foot pain--just numb Is on the higher glipizide dose

## 2016-11-18 NOTE — Progress Notes (Signed)
Subjective:    Patient ID: Adam [name], male    DOB: 1950/05/22, 66 y.o.   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 aspirin 81 MG tablet Take 81 mg by mouth daily.      Marland Kitchen atorvastatin (LIPITOR) 80 MG tablet TAKE ONE TABLET BY MOUTH ONCE DAILY 90 tablet 2  . B-D INS SYR MICROFINE 1CC/28G 28G X 1/2" 1 ML MISC INJECT 100 UNITS SQ AS DIRECTED TWICE DAILY 200 each 0  . furosemide (LASIX) 80 MG tablet TAKE 1 TABLET BY MOUTH  DAILY 90 tablet 1  . glipiZIDE (GLUCOTROL) 10 MG tablet Take 1 tablet (10 mg total) by mouth 2 (two) times daily before a meal. 180 tablet 3  . glucose blood (ONETOUCH VERIO) test strip Use as instructed to check blood sugars twice daily. Dx code: E11.65, Z79.4, E11.65 Insulin dependent type 2 diabetes 100 each 12  . HUMALOG MIX 75/25 (75-25) 100 UNIT/ML SUSP injection INJECT SUBCUTANEOUSLY 100  UNITS TWICE DAILY 10  MINUTES BEFORE MORNING AND  EVENING MEAL 180 mL 1  . lisinopril-hydrochlorothiazide (PRINZIDE,ZESTORETIC) 20-25 MG tablet Take 1 tablet by mouth daily. 90 tablet 3  . lisinopril-hydrochlorothiazide (PRINZIDE,ZESTORETIC) 20-25 MG tablet TAKE ONE TABLET BY MOUTH ONCE DAILY 90 tablet 2  . metFORMIN (GLUCOPHAGE) 1000 MG tablet Take 1 tablet (1,000 mg total) by mouth 2 (two) times daily with a meal. 180 tablet 3   No current facility-administered medications on file prior to visit.     Allergies  Allergen Reactions    . Cephalexin Rash    Past Medical History:  Diagnosis Date  . Cataract   . Diabetes mellitus type II 2002   Hospitalized for very high sugars  . Diverticulosis of colon   . GERD (gastroesophageal reflux disease)   . History of colonic polyps    Hyperplastic  . Hyperlipidemia   . Hypertension   . Phimosis 2003   Repair  . Venous insufficiency     Past Surgical History:  Procedure Laterality Date  . HYDROCELE EXCISION / REPAIR  10/07   [facility])    Family History  Problem Relation Age of Onset  . Diabetes Mother   . Hypertension Mother   . Diabetes Father   . Mental illness Brother        Hx of schizophrenia  . Diabetes Brother   . Hypertension Brother   . Throat cancer Brother   . Colon cancer Neg Hx     Social History   Social History  . Marital status: Married    Spouse name: N/A  . Number of children: 3  . Years of education: N/A   Occupational History  . 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 Smoking status: Former Smoker    Years: 37.00    Types: Cigarettes    Quit date: 02/24/1998  . Smokeless tobacco: Never Used  . Alcohol  use No  . Drug use: No  . Sexual activity: Not on file   Other Topics Concern  . Not on file   Social History Narrative   No living will   Requests wife, then 3 daughter, to make health care decisions   Would accept resuscitation   Not sure about tube feeds--but might consider   Review of Systems  Usually sleeps okay Weight back up some Appetite is okay     Objective:   Physical Exam  Constitutional: He appears well-nourished. No distress.  Neck: No thyromegaly present.  Cardiovascular: Normal rate, regular rhythm and normal heart sounds.  Exam reveals no gallop.   No murmur heard. Faint pedal pulses  Pulmonary/Chest: Effort normal and breath sounds normal. No respiratory distress. He has no wheezes. He has no rales.  Musculoskeletal:  Mild edema in both feet   Lymphadenopathy:    He has no cervical adenopathy.  Skin:  No foot lesions          Assessment & Plan:

## 2016-11-18 NOTE — Addendum Note (Signed)
Addended by: Brenton Grills on: 05/04/2160 44:69 AM   Modules accepted: Orders

## 2016-11-18 NOTE — Patient Instructions (Signed)
Please eat some breakfast daily--like toast with jelly---and take 50 units of insulin. Take 100 units before supper.

## 2016-11-19 ENCOUNTER — Encounter: Payer: Self-pay | Admitting: *Deleted

## 2016-12-27 ENCOUNTER — Encounter: Payer: Self-pay | Admitting: Emergency Medicine

## 2016-12-27 ENCOUNTER — Emergency Department: Payer: Medicare Other

## 2016-12-27 ENCOUNTER — Emergency Department
Admission: EM | Admit: 2016-12-27 | Discharge: 2016-12-27 | Disposition: A | Discharge status: Home / Self Care | Payer: Medicare Other | Attending: Emergency Medicine | Admitting: Emergency Medicine

## 2016-12-27 DIAGNOSIS — Y999 Unspecified external cause status: Secondary | ICD-10-CM | POA: Insufficient documentation

## 2016-12-27 DIAGNOSIS — Y939 Activity, unspecified: Secondary | ICD-10-CM | POA: Insufficient documentation

## 2016-12-27 DIAGNOSIS — I110 Hypertensive heart disease with heart failure: Secondary | ICD-10-CM | POA: Insufficient documentation

## 2016-12-27 DIAGNOSIS — Z794 Long term (current) use of insulin: Secondary | ICD-10-CM | POA: Diagnosis not present

## 2016-12-27 DIAGNOSIS — Z7982 Long term (current) use of aspirin: Secondary | ICD-10-CM | POA: Insufficient documentation

## 2016-12-27 DIAGNOSIS — W010XXA Fall on same level from slipping, tripping and stumbling without subsequent striking against object, initial encounter: Secondary | ICD-10-CM | POA: Insufficient documentation

## 2016-12-27 DIAGNOSIS — I5032 Chronic diastolic (congestive) heart failure: Secondary | ICD-10-CM | POA: Insufficient documentation

## 2016-12-27 DIAGNOSIS — R29810 Facial weakness: Secondary | ICD-10-CM | POA: Diagnosis not present

## 2016-12-27 DIAGNOSIS — S62112A Displaced fracture of triquetrum [cuneiform] bone, left wrist, initial encounter for closed fracture: Secondary | ICD-10-CM | POA: Insufficient documentation

## 2016-12-27 DIAGNOSIS — E119 Type 2 diabetes mellitus without complications: Secondary | ICD-10-CM | POA: Insufficient documentation

## 2016-12-27 DIAGNOSIS — Z87891 Personal history of nicotine dependence: Secondary | ICD-10-CM | POA: Insufficient documentation

## 2016-12-27 DIAGNOSIS — W19XXXA Unspecified fall, initial encounter: Secondary | ICD-10-CM

## 2016-12-27 DIAGNOSIS — S6992XA Unspecified injury of left wrist, hand and finger(s), initial encounter: Secondary | ICD-10-CM | POA: Diagnosis present

## 2016-12-27 DIAGNOSIS — S0990XA Unspecified injury of head, initial encounter: Secondary | ICD-10-CM | POA: Insufficient documentation

## 2016-12-27 DIAGNOSIS — Y929 Unspecified place or not applicable: Secondary | ICD-10-CM | POA: Insufficient documentation

## 2016-12-27 DIAGNOSIS — Z79899 Other long term (current) drug therapy: Secondary | ICD-10-CM | POA: Insufficient documentation

## 2016-12-27 LAB — CBC
HCT: 36.4 % — ABNORMAL LOW (ref 40.0–52.0)
Hemoglobin: 12 g/dL — ABNORMAL LOW (ref 13.0–18.0)
MCH: 28.4 pg (ref 26.0–34.0)
MCHC: 32.9 g/dL (ref 32.0–36.0)
MCV: 86.5 fL (ref 80.0–100.0)
Platelets: 248 10*3/uL (ref 150–440)
RBC: 4.21 MIL/uL — ABNORMAL LOW (ref 4.40–5.90)
RDW: 14.5 % (ref 11.5–14.5)
WBC: 10.9 10*3/uL — ABNORMAL HIGH (ref 3.8–10.6)

## 2016-12-27 LAB — DIFFERENTIAL
BASOS ABS: 0.1 10*3/uL (ref 0–0.1)
BASOS PCT: 1 %
EOS ABS: 0.3 10*3/uL (ref 0–0.7)
Eosinophils Relative: 3 %
Lymphocytes Relative: 40 %
Lymphs Abs: 4.4 10*3/uL — ABNORMAL HIGH (ref 1.0–3.6)
Monocytes Absolute: 0.8 10*3/uL (ref 0.2–1.0)
Monocytes Relative: 7 %
NEUTROS PCT: 49 %
Neutro Abs: 5.3 10*3/uL (ref 1.4–6.5)

## 2016-12-27 LAB — COMPREHENSIVE METABOLIC PANEL WITH GFR
ALT: 36 U/L (ref 17–63)
AST: 35 U/L (ref 15–41)
Albumin: 3.5 g/dL (ref 3.5–5.0)
Alkaline Phosphatase: 60 U/L (ref 38–126)
Anion gap: 7 (ref 5–15)
BUN: 27 mg/dL — ABNORMAL HIGH (ref 6–20)
CO2: 24 mmol/L (ref 22–32)
Calcium: 9.2 mg/dL (ref 8.9–10.3)
Chloride: 104 mmol/L (ref 101–111)
Creatinine, Ser: 1.62 mg/dL — ABNORMAL HIGH (ref 0.61–1.24)
GFR calc Af Amer: 49 mL/min — ABNORMAL LOW
GFR calc non Af Amer: 42 mL/min — ABNORMAL LOW
Glucose, Bld: 234 mg/dL — ABNORMAL HIGH (ref 65–99)
Potassium: 3.8 mmol/L (ref 3.5–5.1)
Sodium: 135 mmol/L (ref 135–145)
Total Bilirubin: 0.8 mg/dL (ref 0.3–1.2)
Total Protein: 8.3 g/dL — ABNORMAL HIGH (ref 6.5–8.1)

## 2016-12-27 LAB — TROPONIN I: Troponin I: <0.03 ng/mL (ref <0.03)

## 2016-12-27 LAB — PROTIME-INR
INR: 1
Prothrombin Time: 13.1 s (ref 11.4–15.2)

## 2016-12-27 LAB — APTT: aPTT: 27 seconds (ref 24–36)

## 2016-12-27 MED ORDER — MORPHINE SULFATE (PF) 4 MG/ML IV SOLN
4.0000 mg | Freq: Once | INTRAVENOUS | Status: AC
  Administered 2016-12-27: 4 mg via INTRAVENOUS
  Filled 2016-12-27: qty 1

## 2016-12-27 MED ORDER — ONDANSETRON HCL 4 MG/2ML IJ SOLN
4.0000 mg | Freq: Once | INTRAMUSCULAR | Status: AC
  Administered 2016-12-27: 4 mg via INTRAVENOUS
  Filled 2016-12-27: qty 2

## 2016-12-27 MED ORDER — OXYCODONE-ACETAMINOPHEN 5-325 MG PO TABS: 1.0000 | ORAL_TABLET | ORAL | 0 refills | Status: DC | PRN

## 2016-12-27 NOTE — ED Notes (Signed)
When pt went to CT scan, pt able to get himself over to CT table and back to stretcher with minimal assistance.

## 2016-12-27 NOTE — ED Triage Notes (Signed)
Patient sustained a mechanical fall, striking the left side of his face/head and subsequently losing consciousness. Patient presents with a left facial droop and left arm weakness

## 2016-12-27 NOTE — ED Notes (Signed)
Pt discharged to home.  Family member driving.  Discharge instructions reviewed.  Verbalized understanding.  No questions or concerns at this time.  Teach back verified.  Pt in NAD.  No items left in ED.   

## 2016-12-27 NOTE — ED Provider Notes (Signed)
[facility] Emergency Department Provider Note   ____________________________________________   I have reviewed the triage vital signs and the nursing notes.   HISTORY  Chief Complaint Neurologic Problem   History limited by: Not Limited   HPI Adam [name] is a 66 y.o. male who presents to the emergency department today because of concern for fall, left facial droop.   LOCATION:left face DURATION:this evening TIMING: sudden onset QUALITY: weakness CONTEXT: patient states that he tripped on a toolbox. Fell onto concrete and hit the left side of his head. He did have loss of consciousness for a few seconds. After coming to noticed he was having left facial droop.  MODIFYING FACTORS: none ASSOCIATED SYMPTOMS: double vision  Per medical record review patient has a history of DM  Past Medical History:  Diagnosis Date  . Cataract   . Diabetes mellitus type II 2002   Hospitalized for very high sugars  . Diverticulosis of colon   . GERD (gastroesophageal reflux disease)   . History of colonic polyps    Hyperplastic  . Hyperlipidemia   . Hypertension   . Phimosis 2003   Repair  . Venous insufficiency     Patient Active Problem List   Diagnosis Date Noted  . BPH (benign prostatic hyperplasia) 05/13/2016  . Sleep disturbance 05/01/2015  . Chronic diastolic heart failure ([place]) 02/02/2015  . Advance directive discussed with patient 11/07/2014  . Chronic venous insufficiency 01/07/2013  . Type 2 diabetes mellitus with neurological manifestations, uncontrolled ([place]) 06/26/2011  . Routine general medical examination at a health care facility 06/17/2010  . Hyperlipemia 06/09/2006  . Essential hypertension, benign 06/09/2006  . GERD 06/09/2006  . DIVERTICULOSIS, COLON 06/09/2006  . COLONIC POLYPS, HX OF 06/09/2006    Past Surgical History:  Procedure Laterality Date  . HYDROCELE EXCISION / REPAIR  10/07   [facility])    Prior to Admission  medications   Medication Sig Start Date End Date Taking? Authorizing Provider  aspirin 81 MG tablet Take 81 mg by mouth daily.      [provider]  atorvastatin (LIPITOR) 80 MG tablet TAKE ONE TABLET BY MOUTH ONCE DAILY 11/10/16   Viviana Simpler I, MD  B-D INS SYR MICROFINE 1CC/28G 28G X 1/2" 1 ML MISC INJECT 100 UNITS SQ AS DIRECTED TWICE DAILY 09/23/12   [name], MD  furosemide (LASIX) 80 MG tablet TAKE 1 TABLET BY MOUTH  DAILY 08/13/16   [name], MD  glipiZIDE (GLUCOTROL) 10 MG tablet Take 1 tablet (10 mg total) by mouth 2 (two) times daily before a meal. 05/14/16   Viviana Simpler I, MD  glucose blood (ONETOUCH VERIO) test strip Use as instructed to check blood sugars twice daily. Dx code: E11.65, Z79.4, E11.65 Insulin dependent type 2 diabetes 01/21/16   [name], MD  insulin lispro protamine-lispro (HUMALOG MIX 75/25) (75-25) 100 UNIT/ML SUSP injection 50 units before breakfast and 100 units before supper 11/18/16   [name], MD  lisinopril-hydrochlorothiazide (PRINZIDE,ZESTORETIC) 20-25 MG tablet Take 1 tablet by mouth daily. 07/31/15   [name], MD  lisinopril-hydrochlorothiazide (PRINZIDE,ZESTORETIC) 20-25 MG tablet TAKE ONE TABLET BY MOUTH ONCE DAILY 09/04/16   [name], MD  metFORMIN (GLUCOPHAGE) 1000 MG tablet Take 1 tablet (1,000 mg total) by mouth 2 (two) times daily with a meal. 07/31/15   [name], MD    Allergies Cephalexin  Family History  Problem Relation Age of Onset  . Diabetes Mother   .  Hypertension Mother   . Diabetes Father   . Mental illness Brother        Hx of schizophrenia  . Diabetes Brother   . Hypertension Brother   . Throat cancer Brother   . Colon cancer Neg Hx     Social History Social History  Substance Use Topics  . Smoking status: Former Smoker    Years: 37.00    Types: Cigarettes    Quit date: 02/24/1998  . Smokeless tobacco: Never Used  . Alcohol use No    Review of  Systems Constitutional: No fever/chills Eyes: No visual changes. ENT: No sore throat. Cardiovascular: Positive for left chest pain. Respiratory: Denies shortness of breath. Gastrointestinal: No abdominal pain.  No nausea, no vomiting.  No diarrhea.   Genitourinary: Negative for dysuria. Musculoskeletal: Positive for left wrist pain. Skin: Positive for abrasion to left cheek. Neurological: Positive for left facial droop.   ____________________________________________   PHYSICAL EXAM:  VITAL SIGNS: ED Triage Vitals  Enc Vitals Group     BP 12/27/16 1810 (!) 153/82     Pulse Rate 12/27/16 1810 93     Resp 12/27/16 1810 18     Temp 12/27/16 1810 98.6 F (37 C)     Temp Source 12/27/16 1810 Oral     SpO2 12/27/16 1810 96 %     Weight 12/27/16 1810 282 lb (127.9 kg)     Height --      Head Circumference --      Peak Flow --      Pain Score 12/27/16 1834 7   Constitutional: Alert and oriented. Well appearing and in no distress. Eyes: subconjunctival hemorrhage to left eye. ENT   Head: Normocephalic and atraumatic.   Nose: No congestion/rhinnorhea.   Mouth/Throat: Mucous membranes are moist.   Neck: No stridor. Hematological/Lymphatic/Immunilogical: No cervical lymphadenopathy. Cardiovascular: Normal rate, regular rhythm.  No murmurs, rubs, or gallops.  Respiratory: Normal respiratory effort without tachypnea nor retractions. Breath sounds are clear and equal bilaterally. No wheezes/rales/rhonchi. Gastrointestinal: Soft and non tender. No rebound. No guarding.  Genitourinary: Deferred Musculoskeletal: Tenderness to left wrist. Neurologic:  Some drooping to left face.  Skin:  Skin is warm, dry and intact. No rash noted. Psychiatric: Mood and affect are normal. Speech and behavior are normal. Patient exhibits appropriate insight and judgment.  ____________________________________________    LABS (pertinent positives/negatives)  Trop <0.03 CMP glu 234, cr  1.62 CBC wbc 10.9, hgb 12.0    ____________________________________________    RADIOLOGY  CT cspine, max face, head No acute osseous injury, no intracranial injury  CXR No acute osseous injury  Left wrist Possible triquetral fracture ____________________________________________   PROCEDURES  Procedures  ____________________________________________   INITIAL IMPRESSION / ASSESSMENT AND PLAN / ED COURSE  Pertinent labs & imaging results that were available during my care of the patient were reviewed by me and considered in my medical decision making (see chart for details).  Patient presented to the emergency department today because of concerns for left sided facial drooping and weakness.  This occurred after a fall.  Concern was potentially for stroke.  Neurology was consulted who did not think it was likely stroke.  Think is likely secondary to traumatic injury.  I do think this is likely the most likely explanation.  Left wrist x-ray did show possible fracture.  Patient was put in splint.  Patient will be discharged home.  Discussed findings and plan with patient and family.  [place] drug database was evaluated  prior to prescription.  ____________________________________________   FINAL CLINICAL IMPRESSION(S) / ED DIAGNOSES  Final diagnoses:  Fall, initial encounter  Closed chip fracture of triquetrum of left wrist, initial encounter     Note: This dictation was prepared with Dragon dictation. Any transcriptional errors that result from this process are unintentional     Nance Pear, MD 12/27/16 2144

## 2016-12-27 NOTE — Discharge Instructions (Signed)
Please seek medical attention for any high fevers, chest pain, shortness of breath, change in behavior, persistent vomiting, bloody stool or any other new or concerning symptoms.  

## 2017-01-01 ENCOUNTER — Other Ambulatory Visit: Payer: Self-pay | Admitting: Orthopedic Surgery

## 2017-01-01 ENCOUNTER — Telehealth: Payer: Self-pay

## 2017-01-01 DIAGNOSIS — G8929 Other chronic pain: Secondary | ICD-10-CM

## 2017-01-01 DIAGNOSIS — M25512 Pain in left shoulder: Principal | ICD-10-CM

## 2017-01-01 NOTE — Telephone Encounter (Signed)
Went to ER for neurological issues. Wife said he is doing better.

## 2017-01-04 ENCOUNTER — Other Ambulatory Visit: Payer: Self-pay | Admitting: Internal Medicine

## 2017-01-08 ENCOUNTER — Ambulatory Visit
Admission: RE | Admit: 2017-01-08 | Discharge: 2017-01-08 | Disposition: A | Discharge status: Home / Self Care | Payer: Medicare Other | Source: Ambulatory Visit | Attending: Orthopedic Surgery | Admitting: Orthopedic Surgery

## 2017-01-08 DIAGNOSIS — M75102 Unspecified rotator cuff tear or rupture of left shoulder, not specified as traumatic: Secondary | ICD-10-CM | POA: Diagnosis not present

## 2017-01-08 DIAGNOSIS — G8929 Other chronic pain: Secondary | ICD-10-CM | POA: Insufficient documentation

## 2017-01-08 DIAGNOSIS — M25512 Pain in left shoulder: Secondary | ICD-10-CM | POA: Diagnosis present

## 2017-01-19 ENCOUNTER — Other Ambulatory Visit: Payer: Self-pay | Admitting: Orthopedic Surgery

## 2017-01-30 ENCOUNTER — Ambulatory Visit (INDEPENDENT_AMBULATORY_CARE_PROVIDER_SITE_OTHER): Payer: Medicare Other | Admitting: Internal Medicine

## 2017-01-30 ENCOUNTER — Ambulatory Visit (INDEPENDENT_AMBULATORY_CARE_PROVIDER_SITE_OTHER)
Admission: RE | Admit: 2017-01-30 | Discharge: 2017-01-30 | Disposition: A | Discharge status: Home / Self Care | Payer: Medicare Other | Source: Ambulatory Visit | Attending: Internal Medicine | Admitting: Internal Medicine

## 2017-01-30 ENCOUNTER — Encounter: Payer: Self-pay | Admitting: Internal Medicine

## 2017-01-30 VITALS — BP 140/88 | HR 87 | Temp 98.2°F | Wt 275.0 lb

## 2017-01-30 DIAGNOSIS — E1165 Type 2 diabetes mellitus with hyperglycemia: Secondary | ICD-10-CM

## 2017-01-30 DIAGNOSIS — IMO0002 Reserved for concepts with insufficient information to code with codable children: Secondary | ICD-10-CM

## 2017-01-30 DIAGNOSIS — R059 Cough, unspecified: Secondary | ICD-10-CM

## 2017-01-30 DIAGNOSIS — E1149 Type 2 diabetes mellitus with other diabetic neurological complication: Secondary | ICD-10-CM

## 2017-01-30 DIAGNOSIS — R05 Cough: Secondary | ICD-10-CM

## 2017-01-30 DIAGNOSIS — Z01818 Encounter for other preprocedural examination: Secondary | ICD-10-CM | POA: Diagnosis not present

## 2017-01-30 MED ORDER — HYDROCODONE-HOMATROPINE 5-1.5 MG/5ML PO SYRP: 5.0000 mL | ORAL_SOLUTION | Freq: Every evening | ORAL | 0 refills | Status: DC | PRN

## 2017-01-30 NOTE — Assessment & Plan Note (Addendum)
For relatively low risk shoulder procedure No evidence of coronary ischemia and CHF is compensated (actually weight is down) EKG-- sinus at 89. First degree A-V block. Lateral T wave inversions only. All this is unchanged from 02/02/15  Some dry cough for a few weeks. Nothing worrisome CXR--no CHF and no apparent pneumonia. Will await radiology overread  I feel he is optimized for surgery at this point despite chronic poor diabetic control. Okay to proceed

## 2017-01-30 NOTE — Addendum Note (Signed)
Addended by: Viviana Simpler I on: 01/30/2017 11:35 AM   Modules accepted: Orders

## 2017-01-30 NOTE — Progress Notes (Signed)
Subjective:    Patient ID: Adam [name], male    DOB: September 25, 1950, 66 y.o.   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 aspirin 81 MG tablet Take 81 mg by mouth daily.      Marland Kitchen atorvastatin (LIPITOR) 80 MG tablet TAKE ONE TABLET BY MOUTH ONCE DAILY 90 tablet 2  . B-D INS SYR MICROFINE 1CC/28G 28G X 1/2" 1 ML MISC INJECT 100 UNITS SQ AS DIRECTED TWICE DAILY 200 each 0  . furosemide (LASIX) 80 MG tablet TAKE 1 TABLET BY MOUTH  DAILY 90 tablet 1  . glipiZIDE (GLUCOTROL) 10 MG tablet Take 1 tablet (10 mg total) by mouth 2 (two) times daily before a meal. 180 tablet 3  . glucose blood (ONETOUCH VERIO) test strip Use as instructed to check blood sugars twice daily. Dx code: E11.65, Z79.4, E11.65 Insulin dependent type 2 diabetes 100 each 12  . insulin lispro protamine-lispro (HUMALOG MIX 75/25) (75-25) 100 UNIT/ML SUSP injection 50 units before breakfast and 100 units before supper 1 mL 0  . lisinopril-hydrochlorothiazide (PRINZIDE,ZESTORETIC) 20-25 MG tablet Take 1 tablet by mouth daily. 90 tablet 3  . metFORMIN (GLUCOPHAGE) 1000 MG tablet Take 1 tablet (1,000 mg total) by mouth 2 (two) times daily with a meal. 180 tablet 3  . meloxicam (MOBIC) 7.5 MG tablet Mobic 7.5 mg tablet  Take 1 tablet twice a day by oral route.    Marland Kitchen  oxyCODONE-acetaminophen (ROXICET) 5-325 MG tablet Take 1 tablet by mouth every 4 (four) hours as needed for severe pain. (Patient not taking: Reported on 01/30/2017) 15 tablet 0   No current facility-administered medications on file prior to visit.     Allergies  Allergen Reactions  . Cephalexin Rash    Past Medical History:  Diagnosis Date  . Cataract   . Diabetes mellitus type II 2002   Hospitalized for very high sugars  . Diverticulosis of colon   . GERD (gastroesophageal reflux disease)   . History of colonic polyps    Hyperplastic  . Hyperlipidemia   . Hypertension   . Phimosis 2003   Repair  . Venous insufficiency     Past Surgical History:  Procedure Laterality Date  . HYDROCELE EXCISION / REPAIR  10/07   [facility])    Family History  Problem Relation Age of Onset  . Diabetes Mother   . Hypertension Mother   . Diabetes Father   . Mental illness Brother        Hx of schizophrenia  . Diabetes Brother   . Hypertension Brother   . Throat cancer Brother   . Colon cancer Neg Hx     Social History   Socioeconomic History  . Marital status: Married    Spouse name: Not on file  . Number of children: 3  . Years of education:  Not on file  . Highest education level: Not on file  Social Needs  . Financial resource strain: Not on file  . Food insecurity - worry: Not on file  . Food insecurity - inability: Not on file  . Transportation needs - medical: Not on file  . Transportation needs - non-medical: Not on file  Occupational History  . 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 Occupation: Lawn work  Tobacco Use  . Smoking status: Former Smoker    Years: 37.00    Types: Cigarettes    Last attempt to quit: 02/24/1998    Years since quitting: 18.9  . Smokeless tobacco: Never Used  Substance and Sexual Activity  . Alcohol use: No  . Drug use: No  . Sexual activity: Not on file  Other Topics Concern  . Not on file  Social History Narrative    No living will   Requests wife, then 3 daughter, to make health care decisions   Would accept resuscitation   Not sure about tube feeds--but might consider   Review of Systems Appetite is okay--trying to cut down Has lost a few pounds Bowels are okay    Objective:   Physical Exam  Constitutional: He appears well-developed. No distress.  Neck: No thyromegaly present.  Cardiovascular: Normal rate, regular rhythm, normal heart sounds and intact distal pulses. Exam reveals no gallop.  No murmur heard. Pulmonary/Chest: Effort normal and breath sounds normal. No respiratory distress. He has no wheezes. He has no rales.  Abdominal: Soft. There is no tenderness.  Musculoskeletal: He exhibits no edema.  Lymphadenopathy:    He has no cervical adenopathy.          Assessment & Plan:

## 2017-01-30 NOTE — Assessment & Plan Note (Addendum)
Sounds like it is better than before (and was down from his high to 10.6% in September) Even if not well controlled---can still proceed with the surgery

## 2017-01-30 NOTE — Assessment & Plan Note (Signed)
Sounds like mild self limited illness Will check CXR Cough syrup

## 2017-02-12 ENCOUNTER — Inpatient Hospital Stay: Admission: RE | Admit: 2017-02-12 | Payer: Medicare Other | Source: Ambulatory Visit

## 2017-02-18 ENCOUNTER — Encounter
Admission: RE | Admit: 2017-02-18 | Discharge: 2017-02-18 | Disposition: A | Discharge status: Home / Self Care | Payer: Medicare Other | Source: Ambulatory Visit | Attending: Orthopedic Surgery | Admitting: Orthopedic Surgery

## 2017-02-18 ENCOUNTER — Other Ambulatory Visit: Payer: Self-pay

## 2017-02-18 DIAGNOSIS — Z7901 Long term (current) use of anticoagulants: Secondary | ICD-10-CM | POA: Diagnosis not present

## 2017-02-18 DIAGNOSIS — I10 Essential (primary) hypertension: Secondary | ICD-10-CM | POA: Diagnosis not present

## 2017-02-18 DIAGNOSIS — Z87891 Personal history of nicotine dependence: Secondary | ICD-10-CM | POA: Diagnosis not present

## 2017-02-18 DIAGNOSIS — Z86010 Personal history of colonic polyps: Secondary | ICD-10-CM | POA: Diagnosis not present

## 2017-02-18 DIAGNOSIS — Z7982 Long term (current) use of aspirin: Secondary | ICD-10-CM | POA: Diagnosis not present

## 2017-02-18 DIAGNOSIS — E7800 Pure hypercholesterolemia, unspecified: Secondary | ICD-10-CM | POA: Diagnosis not present

## 2017-02-18 DIAGNOSIS — M75122 Complete rotator cuff tear or rupture of left shoulder, not specified as traumatic: Secondary | ICD-10-CM | POA: Diagnosis present

## 2017-02-18 DIAGNOSIS — E119 Type 2 diabetes mellitus without complications: Secondary | ICD-10-CM | POA: Diagnosis not present

## 2017-02-18 DIAGNOSIS — Z794 Long term (current) use of insulin: Secondary | ICD-10-CM | POA: Diagnosis not present

## 2017-02-18 DIAGNOSIS — M7552 Bursitis of left shoulder: Secondary | ICD-10-CM | POA: Diagnosis not present

## 2017-02-18 DIAGNOSIS — Z79899 Other long term (current) drug therapy: Secondary | ICD-10-CM | POA: Diagnosis not present

## 2017-02-18 DIAGNOSIS — M7502 Adhesive capsulitis of left shoulder: Secondary | ICD-10-CM | POA: Diagnosis not present

## 2017-02-18 DIAGNOSIS — E785 Hyperlipidemia, unspecified: Secondary | ICD-10-CM | POA: Diagnosis not present

## 2017-02-18 DIAGNOSIS — I872 Venous insufficiency (chronic) (peripheral): Secondary | ICD-10-CM | POA: Diagnosis not present

## 2017-02-18 LAB — APTT: aPTT: 28 seconds (ref 24–36)

## 2017-02-18 LAB — PROTIME-INR
INR: 0.93
PROTHROMBIN TIME: 12.4 s (ref 11.4–15.2)

## 2017-02-18 LAB — CBC WITH DIFFERENTIAL/PLATELET
BASOS PCT: 1 %
Basophils Absolute: 0.1 10*3/uL (ref 0–0.1)
EOS ABS: 0.3 10*3/uL (ref 0–0.7)
EOS PCT: 4 %
HCT: 34.7 % — ABNORMAL LOW (ref 40.0–52.0)
HEMOGLOBIN: 11.3 g/dL — AB (ref 13.0–18.0)
LYMPHS PCT: 52 %
Lymphs Abs: 4.4 10*3/uL — ABNORMAL HIGH (ref 1.0–3.6)
MCH: 28.1 pg (ref 26.0–34.0)
MCHC: 32.6 g/dL (ref 32.0–36.0)
MCV: 86.3 fL (ref 80.0–100.0)
MONO ABS: 0.7 10*3/uL (ref 0.2–1.0)
Monocytes Relative: 8 %
NEUTROS PCT: 35 %
Neutro Abs: 3 10*3/uL (ref 1.4–6.5)
PLATELETS: 255 10*3/uL (ref 150–440)
RBC: 4.01 MIL/uL — AB (ref 4.40–5.90)
RDW: 14.5 % (ref 11.5–14.5)
WBC: 8.5 10*3/uL (ref 3.8–10.6)

## 2017-02-18 LAB — BASIC METABOLIC PANEL
ANION GAP: 6 (ref 5–15)
BUN: 29 mg/dL — ABNORMAL HIGH (ref 6–20)
CALCIUM: 9.5 mg/dL (ref 8.9–10.3)
CHLORIDE: 106 mmol/L (ref 101–111)
CO2: 27 mmol/L (ref 22–32)
CREATININE: 1.47 mg/dL — AB (ref 0.61–1.24)
GFR calc non Af Amer: 47 mL/min — ABNORMAL LOW (ref >60)
GFR, EST AFRICAN AMERICAN: 55 mL/min — AB (ref >60)
Glucose, Bld: 100 mg/dL — ABNORMAL HIGH (ref 65–99)
Potassium: 4.3 mmol/L (ref 3.5–5.1)
SODIUM: 139 mmol/L (ref 135–145)

## 2017-02-18 MED ORDER — SODIUM CHLORIDE 0.9 % IV SOLN
1500.0000 mg | INTRAVENOUS | Status: DC
  Filled 2017-02-18: qty 1500

## 2017-02-18 NOTE — Patient Instructions (Signed)
Your procedure is scheduled DU:KGURKYHC 27, 2108 Thursday Report to Same Day Surgery on the 2nd floor in the [facility]. To find out your arrival time, please call [phone] between 1PM - 3PM on: February 18, 2017 [facility]  REMEMBER: Instructions that are not followed completely may result in serious medical risk, up to and including death; or upon the discretion of your surgeon and anesthesiologist your surgery may need to be rescheduled.  Do not eat food after midnight the night before your procedure.  No gum chewing or hard candies.  You may however, drink CLEAR liquids up to 2 hours before you are scheduled to arrive at the hospital for your procedure.  Do not drink clear liquids within 2 hours of the start of your surgery.  Clear liquids include: - water  Do NOT drink anything that is not on this list.  Type 1 and Type 2 diabetics should only drink water.  No Alcohol for 24 hours before or after surgery.  No Smoking including e-cigarettes for 24 hours prior to surgery. No chewable tobacco products for at least 6 hours prior to surgery. No nicotine patches on the day of surgery.  Notify your doctor if there is any change in your medical condition (cold, fever, infection).  Do not wear jewelry, make-up, hairpins, clips or nail polish.  Do not wear lotions, powders, or perfumes. You may NOT wear deodorant.  Do not shave 48 hours prior to surgery. Men may shave face and neck.  Contacts and dentures may not be worn into surgery.  Do not bring valuables to the hospital. [facility] is not responsible for any belongings or valuables.   TAKE THESE MEDICATIONS THE MORNING OF SURGERY WITH A SIP OF WATER: NONE    Use CHG Soap or wipes as directed on instruction sheet.  Stop Metformin  2 days prior to surgery.  TAKE 1/2 DOSE OF INSULIN THE NIGHT BEFORE SURGERY AND NONE THE DAY OF SURGERY.  Follow recommendations from Cardiologist, Pulmonologist or PCP regarding stopping  Aspirin, Coumadin, Plavix, Eliquis, Pradaxa, or Pletal. NO ASPIRIN!  Stop Anti-inflammatories such as Advil, Aleve, Ibuprofen, Motrin, Naproxen, Naprosyn, Goodie powder, or aspirin products. (May take Tylenol or Acetaminophen if needed.)  Stop ANY OVER THE COUNTER supplements until after surgery. (May continue Vitamin D, Vitamin B, and multivitamin.)  If you are being admitted to the hospital overnight, leave your suitcase in the car. After surgery it may be brought to your room.  If you are being discharged the day of surgery, you will not be allowed to drive home. You will need someone to drive you home and stay with you that night.   If you are taking public transportation, you will need to have a responsible adult to with you.  Please call the number above if you have any questions about these instructions.

## 2017-02-19 ENCOUNTER — Ambulatory Visit: Payer: Medicare Other | Admitting: Anesthesiology

## 2017-02-19 ENCOUNTER — Observation Stay
Admission: RE | Admit: 2017-02-19 | Discharge: 2017-02-21 | Disposition: A | Discharge status: Skilled Nursing Facility | Payer: Medicare Other | Source: Ambulatory Visit | Attending: Orthopedic Surgery | Admitting: Orthopedic Surgery

## 2017-02-19 ENCOUNTER — Encounter: Payer: Self-pay | Admitting: *Deleted

## 2017-02-19 ENCOUNTER — Encounter
Admission: RE | Disposition: A | Discharge status: Skilled Nursing Facility | Payer: Self-pay | Source: Ambulatory Visit | Attending: Orthopedic Surgery

## 2017-02-19 ENCOUNTER — Other Ambulatory Visit: Payer: Self-pay

## 2017-02-19 DIAGNOSIS — M7502 Adhesive capsulitis of left shoulder: Secondary | ICD-10-CM | POA: Insufficient documentation

## 2017-02-19 DIAGNOSIS — Z87891 Personal history of nicotine dependence: Secondary | ICD-10-CM | POA: Insufficient documentation

## 2017-02-19 DIAGNOSIS — Z794 Long term (current) use of insulin: Secondary | ICD-10-CM | POA: Insufficient documentation

## 2017-02-19 DIAGNOSIS — Z86010 Personal history of colonic polyps: Secondary | ICD-10-CM | POA: Insufficient documentation

## 2017-02-19 DIAGNOSIS — Z7982 Long term (current) use of aspirin: Secondary | ICD-10-CM | POA: Insufficient documentation

## 2017-02-19 DIAGNOSIS — I872 Venous insufficiency (chronic) (peripheral): Secondary | ICD-10-CM | POA: Insufficient documentation

## 2017-02-19 DIAGNOSIS — M75122 Complete rotator cuff tear or rupture of left shoulder, not specified as traumatic: Secondary | ICD-10-CM | POA: Diagnosis not present

## 2017-02-19 DIAGNOSIS — Z79899 Other long term (current) drug therapy: Secondary | ICD-10-CM | POA: Insufficient documentation

## 2017-02-19 DIAGNOSIS — Z9889 Other specified postprocedural states: Secondary | ICD-10-CM

## 2017-02-19 DIAGNOSIS — Z7901 Long term (current) use of anticoagulants: Secondary | ICD-10-CM | POA: Insufficient documentation

## 2017-02-19 DIAGNOSIS — E785 Hyperlipidemia, unspecified: Secondary | ICD-10-CM | POA: Insufficient documentation

## 2017-02-19 DIAGNOSIS — M7552 Bursitis of left shoulder: Secondary | ICD-10-CM | POA: Insufficient documentation

## 2017-02-19 DIAGNOSIS — I10 Essential (primary) hypertension: Secondary | ICD-10-CM | POA: Insufficient documentation

## 2017-02-19 DIAGNOSIS — E119 Type 2 diabetes mellitus without complications: Secondary | ICD-10-CM | POA: Insufficient documentation

## 2017-02-19 DIAGNOSIS — E7800 Pure hypercholesterolemia, unspecified: Secondary | ICD-10-CM | POA: Insufficient documentation

## 2017-02-19 HISTORY — PX: SHOULDER ARTHROSCOPY WITH OPEN ROTATOR CUFF REPAIR: SHX6092

## 2017-02-19 LAB — GLUCOSE, CAPILLARY
GLUCOSE-CAPILLARY: 182 mg/dL — AB (ref 65–99)
GLUCOSE-CAPILLARY: 210 mg/dL — AB (ref 65–99)
GLUCOSE-CAPILLARY: 245 mg/dL — AB (ref 65–99)
GLUCOSE-CAPILLARY: 266 mg/dL — AB (ref 65–99)
Glucose-Capillary: 210 mg/dL — ABNORMAL HIGH (ref 65–99)
Glucose-Capillary: 194 mg/dL — ABNORMAL HIGH (ref 65–99)

## 2017-02-19 SURGERY — ARTHROSCOPY, SHOULDER WITH REPAIR, ROTATOR CUFF, OPEN
Anesthesia: General | Site: Shoulder | Laterality: Left | Wound class: Clean

## 2017-02-19 MED ORDER — LIDOCAINE HCL (PF) 1 % IJ SOLN
INTRAMUSCULAR | Status: AC
  Filled 2017-02-19: qty 30

## 2017-02-19 MED ORDER — ACETAMINOPHEN 650 MG RE SUPP: 650.0000 mg | RECTAL | Status: DC | PRN

## 2017-02-19 MED ORDER — PHENYLEPHRINE HCL 10 MG/ML IJ SOLN: INTRAMUSCULAR | Status: DC | PRN

## 2017-02-19 MED ORDER — MENTHOL 3 MG MT LOZG
1.0000 | LOZENGE | OROMUCOSAL | Status: DC | PRN
  Filled 2017-02-19 (×2): qty 9

## 2017-02-19 MED ORDER — POLYETHYLENE GLYCOL 3350 17 G PO PACK: 17.0000 g | PACK | Freq: Every day | ORAL | Status: DC | PRN

## 2017-02-19 MED ORDER — MAGNESIUM CITRATE PO SOLN
1.0000 | Freq: Once | ORAL | Status: DC | PRN
  Filled 2017-02-19: qty 296

## 2017-02-19 MED ORDER — BISACODYL 10 MG RE SUPP: 10.0000 mg | Freq: Every day | RECTAL | Status: DC | PRN

## 2017-02-19 MED ORDER — ROCURONIUM BROMIDE 50 MG/5ML IV SOLN
INTRAVENOUS | Status: AC
  Filled 2017-02-19: qty 1

## 2017-02-19 MED ORDER — FENTANYL CITRATE (PF) 100 MCG/2ML IJ SOLN
INTRAMUSCULAR | Status: AC
  Administered 2017-02-19: 50 ug via INTRAVENOUS
  Filled 2017-02-19: qty 2

## 2017-02-19 MED ORDER — FENTANYL CITRATE (PF) 100 MCG/2ML IJ SOLN
50.0000 ug | Freq: Once | INTRAMUSCULAR | Status: AC
  Administered 2017-02-19: 50 ug via INTRAVENOUS

## 2017-02-19 MED ORDER — LISINOPRIL-HYDROCHLOROTHIAZIDE 20-25 MG PO TABS: 1.0000 | ORAL_TABLET | Freq: Every day | ORAL | Status: DC

## 2017-02-19 MED ORDER — OXYCODONE HCL 5 MG PO TABS: 5.0000 mg | ORAL_TABLET | ORAL | 0 refills | Status: DC | PRN

## 2017-02-19 MED ORDER — FENTANYL CITRATE (PF) 100 MCG/2ML IJ SOLN
INTRAMUSCULAR | Status: AC
  Filled 2017-02-19: qty 2

## 2017-02-19 MED ORDER — BUPIVACAINE LIPOSOME 1.3 % IJ SUSP
INTRAMUSCULAR | Status: DC | PRN
  Administered 2017-02-19 (×2): 5 mL via PERINEURAL

## 2017-02-19 MED ORDER — EPINEPHRINE 30 MG/30ML IJ SOLN
INTRAMUSCULAR | Status: AC
  Filled 2017-02-19: qty 1

## 2017-02-19 MED ORDER — PHENOL 1.4 % MT LIQD
1.0000 | OROMUCOSAL | Status: DC | PRN
  Filled 2017-02-19: qty 177

## 2017-02-19 MED ORDER — METHOCARBAMOL 500 MG PO TABS: 500.0000 mg | ORAL_TABLET | Freq: Four times a day (QID) | ORAL | Status: DC | PRN

## 2017-02-19 MED ORDER — INSULIN ASPART 100 UNIT/ML ~~LOC~~ SOLN
5.0000 [IU] | Freq: Once | SUBCUTANEOUS | Status: AC
  Administered 2017-02-19: 5 [IU] via SUBCUTANEOUS

## 2017-02-19 MED ORDER — LACTATED RINGERS IV SOLN
INTRAVENOUS | Status: DC | PRN
  Administered 2017-02-19: 11:00:00 via INTRAVENOUS

## 2017-02-19 MED ORDER — INSULIN ASPART 100 UNIT/ML ~~LOC~~ SOLN
SUBCUTANEOUS | Status: AC
Start: 2017-02-19 — End: 2017-02-19
  Administered 2017-02-19: 5 [IU] via SUBCUTANEOUS
  Filled 2017-02-19: qty 1

## 2017-02-19 MED ORDER — OXYCODONE HCL 5 MG PO TABS: 5.0000 mg | ORAL_TABLET | Freq: Once | ORAL | Status: DC | PRN

## 2017-02-19 MED ORDER — LIDOCAINE HCL (PF) 1 % IJ SOLN
INTRAMUSCULAR | Status: AC
  Filled 2017-02-19: qty 5

## 2017-02-19 MED ORDER — INSULIN ASPART 100 UNIT/ML ~~LOC~~ SOLN
SUBCUTANEOUS | Status: AC
  Filled 2017-02-19: qty 1

## 2017-02-19 MED ORDER — OXYCODONE HCL 5 MG/5ML PO SOLN: 5.0000 mg | Freq: Once | ORAL | Status: DC | PRN

## 2017-02-19 MED ORDER — NEOMYCIN-POLYMYXIN B GU 40-200000 IR SOLN
Status: AC
  Filled 2017-02-19: qty 2

## 2017-02-19 MED ORDER — ALUM & MAG HYDROXIDE-SIMETH 200-200-20 MG/5ML PO SUSP: 30.0000 mL | ORAL | Status: DC | PRN

## 2017-02-19 MED ORDER — OXYCODONE HCL 5 MG PO TABS
5.0000 mg | ORAL_TABLET | ORAL | Status: DC | PRN
  Administered 2017-02-19 – 2017-02-20 (×2): 5 mg via ORAL
  Filled 2017-02-19 (×2): qty 1

## 2017-02-19 MED ORDER — LISINOPRIL 20 MG PO TABS
20.0000 mg | ORAL_TABLET | Freq: Every day | ORAL | Status: DC
  Administered 2017-02-21: 20 mg via ORAL
  Filled 2017-02-19: qty 1

## 2017-02-19 MED ORDER — MIDAZOLAM HCL 2 MG/2ML IJ SOLN
INTRAMUSCULAR | Status: AC
  Administered 2017-02-19: 1 mg via INTRAVENOUS
  Filled 2017-02-19: qty 2

## 2017-02-19 MED ORDER — PROPOFOL 10 MG/ML IV BOLUS
INTRAVENOUS | Status: DC | PRN
  Administered 2017-02-19: 200 mg via INTRAVENOUS
  Administered 2017-02-19 (×2): 20 mg via INTRAVENOUS

## 2017-02-19 MED ORDER — MIDAZOLAM HCL 2 MG/2ML IJ SOLN
INTRAMUSCULAR | Status: DC | PRN
  Administered 2017-02-19: 2 mg via INTRAVENOUS

## 2017-02-19 MED ORDER — HYDROCHLOROTHIAZIDE 25 MG PO TABS
25.0000 mg | ORAL_TABLET | Freq: Every day | ORAL | Status: DC
  Administered 2017-02-21: 25 mg via ORAL
  Filled 2017-02-19: qty 1

## 2017-02-19 MED ORDER — PHENYLEPHRINE HCL 10 MG/ML IJ SOLN
INTRAMUSCULAR | Status: DC | PRN
  Administered 2017-02-19 (×3): 100 ug via INTRAVENOUS

## 2017-02-19 MED ORDER — HYDROCODONE-HOMATROPINE 5-1.5 MG/5ML PO SYRP
5.0000 mL | ORAL_SOLUTION | Freq: Every evening | ORAL | Status: DC | PRN
  Administered 2017-02-20: 5 mL via ORAL
  Filled 2017-02-19: qty 5

## 2017-02-19 MED ORDER — SENNA 8.6 MG PO TABS
1.0000 | ORAL_TABLET | Freq: Two times a day (BID) | ORAL | Status: DC
  Administered 2017-02-19 – 2017-02-21 (×4): 8.6 mg via ORAL
  Filled 2017-02-19 (×4): qty 1

## 2017-02-19 MED ORDER — BUPIVACAINE HCL (PF) 0.25 % IJ SOLN
INTRAMUSCULAR | Status: AC
  Filled 2017-02-19: qty 30

## 2017-02-19 MED ORDER — LIDOCAINE HCL (CARDIAC) 20 MG/ML IV SOLN
INTRAVENOUS | Status: DC | PRN
  Administered 2017-02-19: 40 mg via INTRAVENOUS

## 2017-02-19 MED ORDER — SODIUM CHLORIDE 0.9 % IV SOLN
INTRAVENOUS | Status: DC
  Administered 2017-02-19: 18:00:00 via INTRAVENOUS

## 2017-02-19 MED ORDER — BUPIVACAINE HCL (PF) 0.5 % IJ SOLN
INTRAMUSCULAR | Status: DC | PRN
  Administered 2017-02-19: 13 mL via PERINEURAL
  Administered 2017-02-19: 7 mL via PERINEURAL

## 2017-02-19 MED ORDER — ASPIRIN EC 81 MG PO TBEC
81.0000 mg | DELAYED_RELEASE_TABLET | Freq: Every day | ORAL | Status: DC
Start: 2017-02-19 — End: 2017-02-21
  Administered 2017-02-20 – 2017-02-21 (×2): 81 mg via ORAL
  Filled 2017-02-19 (×2): qty 1

## 2017-02-19 MED ORDER — ONDANSETRON HCL 4 MG/2ML IJ SOLN
INTRAMUSCULAR | Status: AC
  Filled 2017-02-19: qty 2

## 2017-02-19 MED ORDER — METOCLOPRAMIDE HCL 5 MG/ML IJ SOLN: 5.0000 mg | Freq: Three times a day (TID) | INTRAMUSCULAR | Status: DC | PRN

## 2017-02-19 MED ORDER — FENTANYL CITRATE (PF) 100 MCG/2ML IJ SOLN: 25.0000 ug | INTRAMUSCULAR | Status: DC | PRN

## 2017-02-19 MED ORDER — INSULIN ASPART 100 UNIT/ML ~~LOC~~ SOLN
0.0000 [IU] | Freq: Three times a day (TID) | SUBCUTANEOUS | Status: DC
  Administered 2017-02-19: 3 [IU] via SUBCUTANEOUS
  Administered 2017-02-20: 11 [IU] via SUBCUTANEOUS
  Administered 2017-02-20: 5 [IU] via SUBCUTANEOUS
  Administered 2017-02-20 – 2017-02-21 (×3): 8 [IU] via SUBCUTANEOUS
  Filled 2017-02-19 (×6): qty 1

## 2017-02-19 MED ORDER — HYDROMORPHONE HCL 1 MG/ML IJ SOLN: 0.5000 mg | INTRAMUSCULAR | Status: DC | PRN

## 2017-02-19 MED ORDER — MIDAZOLAM HCL 2 MG/2ML IJ SOLN
1.0000 mg | Freq: Once | INTRAMUSCULAR | Status: AC
  Administered 2017-02-19: 1 mg via INTRAVENOUS

## 2017-02-19 MED ORDER — NEOMYCIN-POLYMYXIN B GU 40-200000 IR SOLN
Status: DC | PRN
  Administered 2017-02-19: 2 mL

## 2017-02-19 MED ORDER — SODIUM CHLORIDE 0.9 % IV SOLN
INTRAVENOUS | Status: DC | PRN
  Administered 2017-02-19: 100 mL/h via INTRAVENOUS

## 2017-02-19 MED ORDER — EPINEPHRINE PF 1 MG/ML IJ SOLN
INTRAMUSCULAR | Status: DC | PRN
  Administered 2017-02-19: 16 mL

## 2017-02-19 MED ORDER — ONDANSETRON HCL 4 MG/2ML IJ SOLN
INTRAMUSCULAR | Status: DC | PRN
  Administered 2017-02-19: 4 mg via INTRAVENOUS

## 2017-02-19 MED ORDER — SODIUM CHLORIDE 0.9 % IV SOLN
INTRAVENOUS | Status: DC
  Administered 2017-02-19: 07:00:00 via INTRAVENOUS

## 2017-02-19 MED ORDER — BUPIVACAINE HCL (PF) 0.25 % IJ SOLN
INTRAMUSCULAR | Status: DC | PRN
  Administered 2017-02-19: 30 mL

## 2017-02-19 MED ORDER — OXYMETAZOLINE HCL 0.05 % NA SOLN
1.0000 | Freq: Every day | NASAL | Status: DC
  Administered 2017-02-19: 1 via NASAL
  Filled 2017-02-19: qty 15

## 2017-02-19 MED ORDER — PROPOFOL 10 MG/ML IV BOLUS
INTRAVENOUS | Status: AC
  Filled 2017-02-19: qty 20

## 2017-02-19 MED ORDER — METFORMIN HCL 500 MG PO TABS
1000.0000 mg | ORAL_TABLET | Freq: Two times a day (BID) | ORAL | Status: DC
  Administered 2017-02-19 – 2017-02-21 (×4): 1000 mg via ORAL
  Filled 2017-02-19 (×5): qty 2

## 2017-02-19 MED ORDER — DOCUSATE SODIUM 100 MG PO CAPS
100.0000 mg | ORAL_CAPSULE | Freq: Two times a day (BID) | ORAL | Status: DC
  Administered 2017-02-19 – 2017-02-21 (×4): 100 mg via ORAL
  Filled 2017-02-19 (×4): qty 1

## 2017-02-19 MED ORDER — FAMOTIDINE 20 MG PO TABS
ORAL_TABLET | ORAL | Status: AC
  Administered 2017-02-19: 20 mg via ORAL
  Filled 2017-02-19: qty 1

## 2017-02-19 MED ORDER — DEXTROSE 5 % IV SOLN
500.0000 mg | Freq: Four times a day (QID) | INTRAVENOUS | Status: DC | PRN
  Filled 2017-02-19: qty 5

## 2017-02-19 MED ORDER — BUPIVACAINE LIPOSOME 1.3 % IJ SUSP
INTRAMUSCULAR | Status: AC
  Filled 2017-02-19: qty 20

## 2017-02-19 MED ORDER — SUGAMMADEX SODIUM 500 MG/5ML IV SOLN
INTRAVENOUS | Status: DC | PRN
  Administered 2017-02-19: 400 mg via INTRAVENOUS

## 2017-02-19 MED ORDER — ATORVASTATIN CALCIUM 20 MG PO TABS
80.0000 mg | ORAL_TABLET | Freq: Every day | ORAL | Status: DC
  Administered 2017-02-19 – 2017-02-21 (×3): 80 mg via ORAL
  Filled 2017-02-19 (×3): qty 4

## 2017-02-19 MED ORDER — ONDANSETRON HCL 4 MG PO TABS: 4.0000 mg | ORAL_TABLET | Freq: Four times a day (QID) | ORAL | Status: DC | PRN

## 2017-02-19 MED ORDER — ZOLPIDEM TARTRATE 5 MG PO TABS: 5.0000 mg | ORAL_TABLET | Freq: Every evening | ORAL | Status: DC | PRN

## 2017-02-19 MED ORDER — ONDANSETRON HCL 4 MG/2ML IJ SOLN: 4.0000 mg | Freq: Four times a day (QID) | INTRAMUSCULAR | Status: DC | PRN

## 2017-02-19 MED ORDER — MIDAZOLAM HCL 2 MG/2ML IJ SOLN
INTRAMUSCULAR | Status: AC
  Filled 2017-02-19: qty 2

## 2017-02-19 MED ORDER — FAMOTIDINE 20 MG PO TABS
20.0000 mg | ORAL_TABLET | Freq: Once | ORAL | Status: AC
  Administered 2017-02-19: 20 mg via ORAL

## 2017-02-19 MED ORDER — GLIPIZIDE 10 MG PO TABS
10.0000 mg | ORAL_TABLET | Freq: Two times a day (BID) | ORAL | Status: DC
  Administered 2017-02-19 – 2017-02-21 (×4): 10 mg via ORAL
  Filled 2017-02-19 (×5): qty 1

## 2017-02-19 MED ORDER — OXYCODONE HCL 5 MG PO TABS
10.0000 mg | ORAL_TABLET | ORAL | Status: DC | PRN
  Administered 2017-02-20: 10 mg via ORAL
  Filled 2017-02-19: qty 2

## 2017-02-19 MED ORDER — METOCLOPRAMIDE HCL 10 MG PO TABS: 5.0000 mg | ORAL_TABLET | Freq: Three times a day (TID) | ORAL | Status: DC | PRN

## 2017-02-19 MED ORDER — ROCURONIUM BROMIDE 100 MG/10ML IV SOLN
INTRAVENOUS | Status: DC | PRN
  Administered 2017-02-19: 50 mg via INTRAVENOUS
  Administered 2017-02-19 (×2): 10 mg via INTRAVENOUS

## 2017-02-19 MED ORDER — FENTANYL CITRATE (PF) 100 MCG/2ML IJ SOLN
INTRAMUSCULAR | Status: DC | PRN
  Administered 2017-02-19: 100 ug via INTRAVENOUS

## 2017-02-19 MED ORDER — FUROSEMIDE 40 MG PO TABS
80.0000 mg | ORAL_TABLET | Freq: Every day | ORAL | Status: DC
  Administered 2017-02-21: 80 mg via ORAL
  Filled 2017-02-19: qty 2

## 2017-02-19 MED ORDER — BUPIVACAINE HCL (PF) 0.5 % IJ SOLN
INTRAMUSCULAR | Status: AC
  Filled 2017-02-19: qty 20

## 2017-02-19 MED ORDER — VANCOMYCIN HCL IN DEXTROSE 1-5 GM/200ML-% IV SOLN
1000.0000 mg | Freq: Two times a day (BID) | INTRAVENOUS | Status: AC
  Administered 2017-02-19: 1000 mg via INTRAVENOUS
  Filled 2017-02-19: qty 200

## 2017-02-19 MED ORDER — ACETAMINOPHEN 325 MG PO TABS: 650.0000 mg | ORAL_TABLET | ORAL | Status: DC | PRN

## 2017-02-19 MED ORDER — ONDANSETRON HCL 4 MG PO TABS: 4.0000 mg | ORAL_TABLET | Freq: Three times a day (TID) | ORAL | 0 refills | Status: DC | PRN

## 2017-02-19 MED ORDER — DIPHENHYDRAMINE HCL 12.5 MG/5ML PO ELIX: 12.5000 mg | ORAL_SOLUTION | ORAL | Status: DC | PRN

## 2017-02-19 SURGICAL SUPPLY — 79 items
ADAPTER IRRIG TUBE 2 SPIKE SOL (ADAPTER) ×6 IMPLANT
ADPR TBG 2 SPK PMP STRL ASCP (ADAPTER) ×2
ANCH SUT RGNRT REGENETEN (Staple) ×2 IMPLANT
ANCHOR TENDON REGENETEN (Staple) ×6 IMPLANT
ANCHORS BONE REGENETEN (Anchor) ×3 IMPLANT
BUR RADIUS 4.0X18.5 (BURR) ×3 IMPLANT
BUR RADIUS 5.5 (BURR) ×3 IMPLANT
CANISTER SUCT LVC 12 LTR MEDI- (MISCELLANEOUS) IMPLANT
CANNULA 5.75X7 CRYSTAL CLEAR (CANNULA) ×6 IMPLANT
CANNULA PARTIAL THREAD 2X7 (CANNULA) ×3 IMPLANT
CANNULA TWIST IN 8.25X9CM (CANNULA) IMPLANT
CLOSURE WOUND 1/2 X4 (GAUZE/BANDAGES/DRESSINGS) ×2
CONNECTOR PERFECT PASSER (CONNECTOR) ×3 IMPLANT
COOLER POLAR GLACIER W/PUMP (MISCELLANEOUS) ×3 IMPLANT
CRADLE LAMINECT ARM (MISCELLANEOUS) ×3 IMPLANT
DEVICE SUCT BLK HOLE OR FLOOR (MISCELLANEOUS) ×6 IMPLANT
DRAPE IMP U-DRAPE 54X76 (DRAPES) ×6 IMPLANT
DRAPE INCISE IOBAN 66X45 STRL (DRAPES) ×3 IMPLANT
DRAPE SHEET LG 3/4 BI-LAMINATE (DRAPES) ×3 IMPLANT
DRAPE U-SHAPE 47X51 STRL (DRAPES) ×3 IMPLANT
DURAPREP 26ML APPLICATOR (WOUND CARE) ×12 IMPLANT
ELECT REM PT RETURN 9FT ADLT (ELECTROSURGICAL) ×3
ELECTRODE REM PT RTRN 9FT ADLT (ELECTROSURGICAL) ×1 IMPLANT
GAUZE PETRO XEROFOAM 1X8 (MISCELLANEOUS) ×3 IMPLANT
GAUZE SPONGE 4X4 12PLY STRL (GAUZE/BANDAGES/DRESSINGS) ×6 IMPLANT
GLOVE BIOGEL PI IND STRL 7.0 (GLOVE) ×6 IMPLANT
GLOVE BIOGEL PI IND STRL 9 (GLOVE) ×1 IMPLANT
GLOVE BIOGEL PI INDICATOR 7.0 (GLOVE) ×12
GLOVE BIOGEL PI INDICATOR 9 (GLOVE) ×2
GLOVE SURG 9.0 ORTHO LTXF (GLOVE) ×6 IMPLANT
GOWN STRL REUS TWL 2XL XL LVL4 (GOWN DISPOSABLE) ×3 IMPLANT
GOWN STRL REUS W/ TWL LRG LVL3 (GOWN DISPOSABLE) ×3 IMPLANT
GOWN STRL REUS W/ TWL LRG LVL4 (GOWN DISPOSABLE) ×1 IMPLANT
GOWN STRL REUS W/TWL LRG LVL3 (GOWN DISPOSABLE) ×6
GOWN STRL REUS W/TWL LRG LVL4 (GOWN DISPOSABLE) ×3
IMPLANT REGENETEN MEDIUM (Shoulder) ×3 IMPLANT
IV LACTATED RINGER IRRG 3000ML (IV SOLUTION) ×48
IV LR IRRIG 3000ML ARTHROMATIC (IV SOLUTION) ×16 IMPLANT
KIT RM TURNOVER STRD PROC AR (KITS) ×3 IMPLANT
KIT STABILIZATION SHOULDER (MISCELLANEOUS) ×3 IMPLANT
KIT SUTURE 2.8 Q-FIX DISP (MISCELLANEOUS) IMPLANT
KIT SUTURETAK 3.0 INSERT PERC (KITS) IMPLANT
MANIFOLD NEPTUNE II (INSTRUMENTS) ×3 IMPLANT
MASK FACE SPIDER DISP (MASK) ×3 IMPLANT
MAT BLUE FLOOR 46X72 FLO (MISCELLANEOUS) ×9 IMPLANT
NDL SAFETY ECLIPSE 18X1.5 (NEEDLE) ×1 IMPLANT
NEEDLE HYPO 18GX1.5 SHARP (NEEDLE) ×3
NEEDLE HYPO 22GX1.5 SAFETY (NEEDLE) ×3 IMPLANT
NS IRRIG 500ML POUR BTL (IV SOLUTION) ×3 IMPLANT
PACK ARTHROSCOPY SHOULDER (MISCELLANEOUS) ×3 IMPLANT
PAD WRAPON POLAR SHDR XLG (MISCELLANEOUS) ×1 IMPLANT
PASSER SUT CAPTURE FIRST (SUTURE) ×3 IMPLANT
SET TUBE SUCT SHAVER OUTFL 24K (TUBING) ×3 IMPLANT
SET TUBE TIP INTRA-ARTICULAR (MISCELLANEOUS) ×3 IMPLANT
STRAP SAFETY BODY (MISCELLANEOUS) ×3 IMPLANT
STRIP CLOSURE SKIN 1/2X4 (GAUZE/BANDAGES/DRESSINGS) ×4 IMPLANT
SUT ETHILON 4-0 (SUTURE) ×2
SUT ETHILON 4-0 FS2 18XMFL BLK (SUTURE) ×1
SUT KNTLS 2.8 MAGNUM (Anchor) ×9 IMPLANT
SUT LASSO 90 DEG SD STR (SUTURE) IMPLANT
SUT MNCRL 4-0 (SUTURE) ×2
SUT MNCRL 4-0 27XMFL (SUTURE) ×1
SUT PDS AB 0 CT1 27 (SUTURE) ×3 IMPLANT
SUT PERFECTPASSER WHITE CART (SUTURE) ×12 IMPLANT
SUT SMART STITCH CARTRIDGE (SUTURE) ×12 IMPLANT
SUT VIC AB 0 CT1 36 (SUTURE) ×3 IMPLANT
SUT VIC AB 2-0 CT2 27 (SUTURE) ×3 IMPLANT
SUTURE ETHLN 4-0 FS2 18XMF BLK (SUTURE) ×1 IMPLANT
SUTURE MAGNUM WIRE 2X48 BLK (SUTURE) IMPLANT
SUTURE MNCRL 4-0 27XMF (SUTURE) ×1 IMPLANT
SYR 10ML 18GX1 1/2 (NEEDLE) ×3 IMPLANT
SYR 10ML LL (SYRINGE) ×3 IMPLANT
SYR 5ML 18GX1 1/2 (NEEDLE) ×3 IMPLANT
TAPE MICROFOAM 4IN (TAPE) ×3 IMPLANT
TUBING ARTHRO INFLOW-ONLY STRL (TUBING) ×3 IMPLANT
TUBING CONNECTING 10 (TUBING) ×2 IMPLANT
TUBING CONNECTING 10' (TUBING) ×1
WAND HAND CNTRL MULTIVAC 90 (MISCELLANEOUS) ×3 IMPLANT
WRAPON POLAR PAD SHDR XLG (MISCELLANEOUS) ×3
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
## 2017-02-19 NOTE — Anesthesia Procedure Notes (Signed)
Anesthesia Regional Block: Interscalene brachial plexus block   Pre-Anesthetic Checklist: ,, timeout performed, Correct Patient, Correct Site, Correct Laterality, Correct Procedure, Correct Position, site marked, Risks and benefits discussed,  Surgical consent,  Pre-op evaluation,  At surgeon's request and post-op pain management  Laterality: Upper and Left  Prep: chloraprep       Needles:  Injection technique: Single-shot  Needle Type: Stimiplex     Needle Length: 5cm  Needle Gauge: 22     Additional Needles:   Narrative:  Start time: 02/19/2017 7:03 AM End time: 02/19/2017 7:06 AM Injection made incrementally with aspirations every 5 mL.  Performed by: Personally  Anesthesiologist: Rena Hunke, Precious Haws, MD  Additional Notes: Patient reports baseline pre block left shoulder pain and weakness Functioning IV was confirmed and monitors were applied.  A 24mm 22ga Stimuplex needle was used. Sterile prep,hand hygiene and sterile gloves were used.  Minimal sedation used for procedure.  No paresthesia endorsed by patient during the procedure.  Negative aspiration and negative test dose prior to incremental administration of local anesthetic. The patient tolerated the procedure well with no immediate complications.

## 2017-02-19 NOTE — H&P (Signed)
The patient has been re-examined, and the chart reviewed, and there have been no interval changes to the documented history and physical.    The risks, benefits, and alternatives have been discussed at length, and the patient is willing to proceed.   

## 2017-02-19 NOTE — Discharge Instructions (Signed)

## 2017-02-19 NOTE — OR Nursing (Signed)
Pt transferred to room 134 via recliner and report given. Family in room with patient.

## 2017-02-19 NOTE — OR Nursing (Signed)
When assisting pt from bed to recliner, pt had very limited mobility in right leg, unable to lift it to shuffle, states it feels numb. Three nurses required to move pt.   Family says he was walking fine earlier today. Family states they do not have any help at home to get him from car to house.  Dr Mack Guise notified.  Orders to admit for observation.

## 2017-02-19 NOTE — Anesthesia Postprocedure Evaluation (Signed)
Anesthesia Post Note  Patient: Adam [name]  Procedure(s) Performed: SHOULDER ARTHROSCOPY WITH MNI OPEN ROTATOR CUFF REPAIR WITH PATCH PLACEMENT,SUBACROMINAL DECOMPRESSION,LYSIS OF ADHESIONS, DISTAL CLAVICLE EXCISION (Left Shoulder)  Patient location during evaluation: PACU Anesthesia Type: General Level of consciousness: awake and alert Pain management: pain level controlled Vital Signs Assessment: post-procedure vital signs reviewed and stable Respiratory status: spontaneous breathing, nonlabored ventilation, respiratory function stable and patient connected to nasal cannula oxygen Cardiovascular status: blood pressure returned to baseline and stable Postop Assessment: no apparent nausea or vomiting Anesthetic complications: no     Last Vitals:  Vitals:   02/19/17 1237 02/19/17 1303  BP: 109/62 122/69  Pulse: 80 91  Resp: 19 17  Temp:  36.8 C  SpO2: 100%     Last Pain:  Vitals:   02/19/17 1303  TempSrc: Oral  PainSc:                  Precious Haws Waver Dibiasio

## 2017-02-19 NOTE — Anesthesia Procedure Notes (Signed)
Procedure Name: Intubation Date/Time: 02/19/2017 8:05 AM Performed by: Lorie Apley, CRNA Pre-anesthesia Checklist: Patient identified, Patient being monitored, Timeout performed, Emergency Drugs available and Suction available Patient Re-evaluated:Patient Re-evaluated prior to induction Oxygen Delivery Method: Circle system utilized Preoxygenation: Pre-oxygenation with 100% oxygen Induction Type: IV induction Ventilation: Mask ventilation without difficulty Laryngoscope Size: Mac and 3 Grade View: Grade II Tube type: Oral Tube size: 8.0 mm Number of attempts: 1 Airway Equipment and Method: Stylet Placement Confirmation: ETT inserted through vocal cords under direct vision,  positive ETCO2 and breath sounds checked- equal and bilateral Secured at: 24 cm Tube secured with: Tape Dental Injury: Teeth and Oropharynx as per pre-operative assessment

## 2017-02-19 NOTE — Op Note (Signed)
02/19/2017  12:11 PM  PATIENT:  Adam [name]  66 y.o. male  PRE-OPERATIVE DIAGNOSIS:  Full thickness rotator cuff tear with retraction, subacromial impingement, acromioclavicular arthritis  POST-OPERATIVE DIAGNOSIS:  Full thickness rotator cuff tear with retraction, adhesive capsulitis, subacromial impingement, acromioclavicular arthritis   PROCEDURE:  Procedure(s): LEFT SHOULDER ARTHROSCOPY WITH MINI OPEN ROTATOR CUFF REPAIR WITH REGENTEN PATCH PLACEMENT,ARTHROSCOPIC SUBACROMINAL DECOMPRESSION, LYSIS OF ADHESIONS AND DISTAL CLAVICLE EXCISION (Left)  SURGEON:  Surgeon(s) and Role:    * Krasinski, Kevin, MD - Primary  ANESTHESIA:   local, general and paracervical block   PREOPERATIVE INDICATIONS:  Adam [name] is a  66 y.o. male with a diagnosis of M75.122 Complete rotator-cuff tear/ruptr of left shoulder, not trauma failed conservative treatment and elected for surgical management.    The risks benefits and alternatives were discussed with the patient preoperatively including but not limited to the risks of infection, bleeding, nerve injury, persistent pain or weakness, failure of the hardware, re-tear of the rotator cuff and the need for further surgery. Medical risks include DVT and pulmonary embolism, myocardial infarction, stroke, pneumonia, respiratory failure and death. Patient understood these risks and wished to proceed.  OPERATIVE IMPLANTS: ArthroCare Magnum 2 anchors x 3 & Smith and Nephew medium Regeneten patch  OPERATIVE FINDINGS: Large V-shaped tear of the rotator cuff involving the supra and infraspinatus, capsular thickening consistent with adhesive capsulitis in the anterior shoulder, mild fraying of the biceps tendon without overt tear, no SLAP tear, no significant chondral lesions of the glenoid or humeral head, subacromial spurring and joint space narrowing of the acromioclavicular joint.  OPERATIVE PROCEDURE: The patient was met in the preoperative area. The left  shoulder was signed with the word yes and my initials according the hospital's correct site of surgery protocol.   History and physical was updated.   Patient underwent an interscalene block by the anesthesia service with Exparel.  Patient was brought to the operating room where he underwent general endotracheal intubation.  The patient was placed in a beachchair position.  A spider arm positioner was used for this case. Examination under anesthesia revealed limited range of motion of 90 degrees in abduction and forward elevation and ER of 70 degrees in abduction.  He demonstrated no instability with load shift testing. The patient had a negative sulcus sign.  Patient was prepped and draped in a sterile fashion. A timeout was performed to verify the patient's name, date of birth, medical record number, correct site of surgery and correct procedure to be performed there was also used to verify the patient received antibiotics that all appropriate instruments, implants and radiographs studies were available in the room. Once all in attendance were in agreement case began.  Patient received Vancomycin due to his allergy to cephalosporins.    Bony landmarks were drawn out with a surgical marker along with proposed arthroscopy incisions. These were pre-injected with 1% lidocaine plain. An 11 blade was used to establish a posterior portal through which the arthroscope was placed in the glenohumeral joint. A full diagnostic examination of the shoulder was performed.  Patient was found to have a large V-shaped retracted tear involving the supra and infraspinatus. He had significant anterior capsular thickening.  A capsulotomy was performed with a 90 ArthroCare wand and debridement of the rotator interval was performed using a 4.0 resector shaver blade.  A lateral portal was then created using an 18-gauge spinal needle for localization. The 40 resector shaver blade was used to debride synovitis and frayed   edges of the  rotator cuff.  An arthroscopic elevator was used to mobilize the rotator cuff from above the glenoid. A 5.5 mm resector shaver blade was then used to perform debridement of the greater tuberosity footprint. All torn fibers of the rotator cuff were removed from the greater tuberosity to allow a clean site for repair. Punctate bleeding was identified.  The arthroscope was then placed in the subacromial space.  Extensive bursitis was encountered and debrided using a 4-0 resector shaver blade and a 90 ArthroCare wand from a lateral portal. A subacromial decompression was also performed using a 5.5 mm resector shaver blade from the lateral portal.   The 5.5 mm resector shaver blade is then placed through the anterior portal and a distal clavicle excision was performed. Four Perfect Pass suture were placed into the lateral border of the rotator cuff tear. 4 side-to-side sutures were placed closing down the V-shaped tear. Three additional perfect Pass sutures were then placed two in the anterior lateral portion of the rotator cuff tear and one in the posterior lateral portion of the rotator cuff tear near the greater tuberosity.  All arthroscopic instruments were then removed and the mini-open portion of the procedure began.  A saber-type incision was made along the lateral border of the acromion. The deltoid muscle was identified and split in line with its fibers which allowed visualization of the rotator cuff. The Perfect Pass sutures previously placed in the lateral border of the rotator cuff were brought out through the deltoid split.  The 3 Perfect Pass sutures were then anchored to the greater tuberosity footprint using 3 Magnum 2 anchors in a crisscross pattern. These anchors were tensioned to allow for anatomic reduction of the rotator cuff to the greater tuberosity. The decision was made to augment the repair with a Smith and Nephew REGENETEN patch. This patch was positioned and secured through the mini open  incision using a combination of PLA tendon anchors and PEAK bone anchors.  The patch was probed to ensure stability. It iwas well positioned.  There was no dogears seen on the patch. Final images of the repair and the patch were taken externally through the deltoid split incision. Arthroscopic images of the repair were also taken from the glenohumeral joint.   All incisions were copiously irrigated. The deltoid fascia was repaired using a 0 Vicryl suture.  The subcutaneous tissue of the deltoid incision was closed with a 2-0 Vicryl. Skin closure for the arthroscopic incisions was performed with 4-0 nylon. The skin edges of the saber incision was approximated with a running 4-0 undyed Monocryl.  0.25% Marcaine plain was then injected into the subacromial space for postoperative pain control. A dry sterile dressing was applied.  The patient was placed in an abduction sling and a Polar Care was applied to the shoulder.  All sharp and it instrument counts were correct at the conclusion of the case. I was scrubbed and present for the entire case. I spoke with the patient's daughter postoperatively to let her know the case had been performed without complication and the patient was stable in recovery room. 

## 2017-02-19 NOTE — Anesthesia Post-op Follow-up Note (Signed)
Anesthesia QCDR form completed.        

## 2017-02-19 NOTE — Transfer of Care (Signed)
Immediate Anesthesia Transfer of Care Note  Patient: Adam [name]  Procedure(s) Performed: SHOULDER ARTHROSCOPY WITH MNI OPEN ROTATOR CUFF REPAIR WITH PATCH PLACEMENT,SUBACROMINAL DECOMPRESSION,LYSIS OF ADHESIONS, DISTAL CLAVICLE EXCISION (Left Shoulder)  Patient Location: PACU  Anesthesia Type:General  Level of Consciousness: awake and sedated  Airway & Oxygen Therapy: Patient Spontanous Breathing and Patient connected to face mask oxygen  Post-op Assessment: Report given to RN, Post -op Vital signs reviewed and stable and Patient moving all extremities X 4  Post vital signs: Reviewed and stable  Last Vitals:  Vitals:   02/19/17 0742 02/19/17 1153  BP: (!) 141/83 115/68  Pulse: 71 75  Resp: 16 (!) 27  Temp:    SpO2: 96% 96%    Last Pain:  Vitals:   02/19/17 0742  TempSrc:   PainSc: Asleep         Complications: No apparent anesthesia complications

## 2017-02-19 NOTE — Progress Notes (Signed)
  Subjective:  POST-OP CHECK:  Patient is s/p left full thickness rotator cuff repair.  Patient reports pain as minimal.  Patient's family is at the bedside.  Patient is up out of bed to a chair. He is eating dinner. He is tolerating a by mouth diet.  Patient had noticed weakness in the lower extremities postop with some tingling and numbness. He shouldn't is requiring supplemental O2 by nasal cannula to maintain O2 sats in the 90s.  Objective:   VITALS:   Vitals:   02/19/17 1303 02/19/17 1600 02/19/17 1630 02/19/17 1632  BP: 122/69 135/65 138/68   Pulse: 91 85 88   Resp: 17 16 18    Temp: 98.2 F (36.8 C)     TempSrc: Oral     SpO2:  95% (!) 89% 94%  Weight:      Height:        PHYSICAL EXAM: Left upper extremity: Patient's dressing is clean dry and intact. He has an abduction sling and Polar Care on his left shoulder. Patient has the ability to flex and extend all 5 digits and his left wrist. He still has some paresthesias in the left hand but his sensation in the fingers is returning. Patient had an interscalene block with Exparel today.  Bilateral lower extremities: Patient has some numbness and tingling in both the but has intact sensation to light touch. He can flex and extend his toes bilaterally demonstrate some dorsiflexion weakness on the right ankle compared to the left.  LABS  Results for orders placed or performed during the hospital encounter of 02/19/17 (from the past 24 hour(s))  Glucose, capillary     Status: Abnormal   Collection Time: 02/19/17  6:14 AM  Result Value Ref Range   Glucose-Capillary 182 (H) 65 - 99 mg/dL  Glucose, capillary     Status: Abnormal   Collection Time: 02/19/17 11:53 AM  Result Value Ref Range   Glucose-Capillary 210 (H) 65 - 99 mg/dL  Glucose, capillary     Status: Abnormal   Collection Time: 02/19/17 12:36 PM  Result Value Ref Range   Glucose-Capillary 210 (H) 65 - 99 mg/dL  Glucose, capillary     Status: Abnormal   Collection Time:  02/19/17  3:13 PM  Result Value Ref Range   Glucose-Capillary 245 (H) 65 - 99 mg/dL  Glucose, capillary     Status: Abnormal   Collection Time: 02/19/17  5:44 PM  Result Value Ref Range   Glucose-Capillary 194 (H) 65 - 99 mg/dL    No results found.  Assessment/Plan: Day of Surgery   Active Problems:   S/P rotator cuff surgery  Patient has evidence of dorsiflexion weakness on the right and paresthesias in both lower extremities. This is likely due to surgical positioning. I believe he had compression of the sciatic nerves from the beachchair position. I expect this to fully recover over the next 24-48 hours.  He shouldn't will receive physical and occupational therapy tomorrow. He is going to wear his sling continuously on the left upper extremity. Continue current pain management. He will likely likely be discharged home tomorrow. He is standing for neurovascular monitoring and continued supplemental O2.  I will continue to follow the patient's progress.  Patient and his family understood and agreed with this plan.    [name] , MD 02/19/2017, 6:23 PM

## 2017-02-20 ENCOUNTER — Encounter: Payer: Self-pay | Admitting: Orthopedic Surgery

## 2017-02-20 DIAGNOSIS — M75122 Complete rotator cuff tear or rupture of left shoulder, not specified as traumatic: Secondary | ICD-10-CM | POA: Diagnosis not present

## 2017-02-20 LAB — BASIC METABOLIC PANEL
Anion gap: 4 — ABNORMAL LOW (ref 5–15)
BUN: 31 mg/dL — AB (ref 6–20)
CALCIUM: 8.2 mg/dL — AB (ref 8.9–10.3)
CHLORIDE: 107 mmol/L (ref 101–111)
CO2: 25 mmol/L (ref 22–32)
CREATININE: 1.79 mg/dL — AB (ref 0.61–1.24)
GFR calc non Af Amer: 37 mL/min — ABNORMAL LOW (ref >60)
GFR, EST AFRICAN AMERICAN: 43 mL/min — AB (ref >60)
Glucose, Bld: 242 mg/dL — ABNORMAL HIGH (ref 65–99)
Potassium: 4.2 mmol/L (ref 3.5–5.1)
SODIUM: 136 mmol/L (ref 135–145)

## 2017-02-20 LAB — CBC
HCT: 32.4 % — ABNORMAL LOW (ref 40.0–52.0)
Hemoglobin: 10.3 g/dL — ABNORMAL LOW (ref 13.0–18.0)
MCH: 28 pg (ref 26.0–34.0)
MCHC: 31.8 g/dL — AB (ref 32.0–36.0)
MCV: 87.9 fL (ref 80.0–100.0)
PLATELETS: 213 10*3/uL (ref 150–440)
RBC: 3.69 MIL/uL — ABNORMAL LOW (ref 4.40–5.90)
RDW: 15.1 % — AB (ref 11.5–14.5)
WBC: 9 10*3/uL (ref 3.8–10.6)

## 2017-02-20 LAB — GLUCOSE, CAPILLARY
GLUCOSE-CAPILLARY: 234 mg/dL — AB (ref 65–99)
Glucose-Capillary: 226 mg/dL — ABNORMAL HIGH (ref 65–99)
Glucose-Capillary: 259 mg/dL — ABNORMAL HIGH (ref 65–99)
Glucose-Capillary: 322 mg/dL — ABNORMAL HIGH (ref 65–99)

## 2017-02-20 MED ORDER — ENOXAPARIN SODIUM 40 MG/0.4ML ~~LOC~~ SOLN: 40.0000 mg | SUBCUTANEOUS | 0 refills | Status: DC

## 2017-02-20 MED ORDER — ENOXAPARIN SODIUM 40 MG/0.4ML ~~LOC~~ SOLN
40.0000 mg | SUBCUTANEOUS | Status: DC
  Administered 2017-02-20: 40 mg via SUBCUTANEOUS
  Filled 2017-02-20: qty 0.4

## 2017-02-20 NOTE — Addendum Note (Signed)
Addendum  created 02/20/17 0946 by Doreen Salvage, CRNA   Charge Capture section accepted

## 2017-02-20 NOTE — Care Management Obs Status (Signed)
[place] NOTIFICATION   Patient Details  Name: Adam [name] MRN: [id number] Date of Birth: 09/22/1950   Medicare Observation Status Notification Given:  Yes    Jolly Mango, RN 02/20/2017, 2:34 PM

## 2017-02-20 NOTE — Evaluation (Signed)
Physical Therapy Evaluation Patient Details Name: Adam [name] MRN: [id number] DOB: Mar 24, 1950 Today's Date: 02/20/2017   History of Present Illness  Pt is a 66 y/o M with L full thickness RTC, s/p L shoulder arthroscopy with open RTC repair, subacrominal decompression, lysis of adhesions and distal clavicle excision.  Following surgery pt with LE weakness and tingling and numbness. Pt's PMH includes venous insufficiency, cataract.     Clinical Impression  Patient is s/p above surgery resulting in functional limitations due to the deficits listed below (see PT Problem List). Due to impaired sensation BLEs the pt presents with significant imbalance, requiring mod assist for transfers and ambulation.  Pt staggering L and R while ambulating and requires cues for proper management of hemiwalker and for upright posture.  Hopeful that if pt's sensation improves so will his mobility. Pt's wife unable to provide physical assist as wife often uses a WC for mobility.  Given pt's current mobility status, recommending SNF at d/c.   Patient will benefit from skilled PT to increase their independence and safety with mobility to allow discharge to the venue listed below.      Follow Up Recommendations SNF    Equipment Recommendations  Other (comment)(Hemiwalker)    Recommendations for Other Services OT consult     Precautions / Restrictions Precautions Precautions: Fall;Shoulder Type of Shoulder Precautions: Sling at all times except ADL/exercise, NWB, AROM elbow wrist and hand to tolerance, No PROM of shoulder, No AROM of shoulder Shoulder Interventions: Shoulder abduction pillow;Shoulder sling/immobilizer Precaution Booklet Issued: No Restrictions Weight Bearing Restrictions: Yes LUE Weight Bearing: Non weight bearing      Mobility  Bed Mobility Overal bed mobility: Needs Assistance Bed Mobility: Supine to Sit     Supine to sit: Min guard;HOB elevated     General bed mobility  comments: Heavy use of bed rail with increased time and effort.   Transfers Overall transfer level: Needs assistance Equipment used: Straight cane;Hemi-walker Transfers: Sit to/from [facility] Sit to Stand: Mod assist;+2 safety/equipment Stand pivot transfers: Mod assist;+2 safety/equipment       General transfer comment: On first attempt the pt stands and SPC in standing with mod assist to boost to standing and to remain steady, pt with anterior weight shift.  On second attempt hemiwalker was introduced.  Pt requires mod assist to remain steady and min assist to boost.  Pt with very wide BOS.  Cues for upright posture as pt tends to shift weight anteriorly.  When sitting cues to reach back for armrest and to control descent which he does well with min +2 assist.   Ambulation/Gait Ambulation/Gait assistance: Mod assist;+2 safety/equipment Ambulation Distance (Feet): 25 Feet Assistive device: Hemi-walker Gait Pattern/deviations: Decreased stride length;Shuffle;Staggering right;Staggering left;Wide base of support Gait velocity: decreased Gait velocity interpretation: Below normal speed for age/gender General Gait Details: Pt shuffling feet and staggering at times due to decreased sensation BLEs.  Cues for upright posture as pt has tendency to shift weight anteriorly.  Mild cues for proper management of hemiwalker.  Close chair follow for safety.   Stairs            Wheelchair Mobility    Modified Rankin (Stroke Patients Only)       Balance Overall balance assessment: Needs assistance Sitting-balance support: No upper extremity supported;Feet supported Sitting balance-Leahy Scale: Good     Standing balance support: Single extremity supported;During functional activity Standing balance-Leahy Scale: Poor Standing balance comment: Pt relies on UE support and outside physical  assist during static and dynamic activities.                               Pertinent Vitals/Pain Pain Assessment: No/denies pain    Home Living Family/patient expects to be discharged to:: Private residence Living Arrangements: Spouse/significant other Available Help at Discharge: Family;Available 24 hours/day(limited physical assist from wife) Type of Home: House Home Access: Stairs to enter Entrance Stairs-Rails: Left Entrance Stairs-Number of Steps: 2 Home Layout: One level Home Equipment: Cane - single point;Grab bars - tub/shower      Prior Function Level of Independence: Independent         Comments: Pt independent without AD.  No additional falls in the past 6 months.  Independent with all ADLs, IADLs.      Hand Dominance   Dominant Hand: Right    Extremity/Trunk Assessment   Upper Extremity Assessment Upper Extremity Assessment: LUE deficits/detail LUE Deficits / Details: in shoulder abduction sling.  Adjusted sling for better fit.      Lower Extremity Assessment Lower Extremity Assessment: RLE deficits/detail;LLE deficits/detail RLE Deficits / Details: DF 4/5, otherwise strength WNL.  Decreased sensation BLEs L4-S1. RLE Sensation: decreased light touch LLE Deficits / Details: DF 4/5, otherwise strength WNL.  Decreased sensation BLEs L4-S1. LLE Sensation: decreased light touch       Communication   Communication: No difficulties  Cognition Arousal/Alertness: Awake/alert Behavior During Therapy: WFL for tasks assessed/performed Overall Cognitive Status: Within Functional Limits for tasks assessed                                        General Comments      Exercises Other Exercises Other Exercises: Lateral weight shifting in standing at EOB with R hemiwalker. x10 each direction   Assessment/Plan    PT Assessment Patient needs continued PT services  PT Problem List Decreased strength;Decreased range of motion;Decreased activity tolerance;Decreased balance;Decreased mobility;Decreased knowledge of use  of DME;Decreased safety awareness;Decreased knowledge of precautions;Pain;Impaired sensation       PT Treatment Interventions DME instruction;Gait training;Stair training;Functional mobility training;Therapeutic activities;Therapeutic exercise;Balance training;Neuromuscular re-education;Patient/family education;Wheelchair mobility training;Modalities    PT Goals (Current goals can be found in the Care Plan section)  Acute Rehab PT Goals Patient Stated Goal: to return to PLOF PT Goal Formulation: With patient Time For Goal Achievement: 03/06/17 Potential to Achieve Goals: Good    Frequency BID   Barriers to discharge Inaccessible home environment;Decreased caregiver support 2 steps to enter home, wife able to provide limited assist at d/c    Co-evaluation               AM-PAC PT "6 Clicks" Daily Activity  Outcome Measure Difficulty turning over in bed (including adjusting bedclothes, sheets and blankets)?: Unable Difficulty moving from lying on back to sitting on the side of the bed? : Unable Difficulty sitting down on and standing up from a chair with arms (e.g., wheelchair, bedside commode, etc,.)?: Unable Help needed moving to and from a bed to chair (including a wheelchair)?: A Lot Help needed walking in hospital room?: A Lot Help needed climbing 3-5 steps with a railing? : Total 6 Click Score: 8    End of Session Equipment Utilized During Treatment: Gait belt;Other (comment)(L shoulder abduction sling) Activity Tolerance: Patient tolerated treatment well Patient left: in chair;with call bell/phone within reach;with chair alarm  set;Other (comment)(with polar care on ) Nurse Communication: Mobility status PT Visit Diagnosis: Pain;Muscle weakness (generalized) (M62.81);Difficulty in walking, not elsewhere classified (R26.2);Other abnormalities of gait and mobility (R26.89);Unsteadiness on feet (R26.81) Pain - Right/Left: Left Pain - part of body: Shoulder    Time:  1610-9604 PT Time Calculation (min) (ACUTE ONLY): 39 min   Charges:   PT Evaluation $PT Eval Moderate Complexity: 1 Mod PT Treatments $Gait Training: 8-22 mins $Therapeutic Activity: 8-22 mins   PT G Codes:   PT G-Codes **NOT FOR INPATIENT CLASS** Functional Assessment Tool Used: AM-PAC 6 Clicks Basic Mobility;Clinical judgement Functional Limitation: Mobility: Walking and moving around Mobility: Walking and Moving Around Current Status (V4098): At least 80 percent but less than 100 percent impaired, limited or restricted Mobility: Walking and Moving Around Goal Status [phone]): At least 40 percent but less than 60 percent impaired, limited or restricted    Collie Siad PT, DPT 02/20/2017, 10:51 AM

## 2017-02-20 NOTE — Progress Notes (Signed)
Subjective:  POD #1 s/p left shoulder rotator cuff repair.  Patient reports right shoulder pain as mild.  He still has numbness and tingling in both feet, but this is improving.  Motor function has returned to both feet. Occupational therapy is  in the room.  Objective:   VITALS:   Vitals:   02/19/17 1943 02/19/17 2337 02/20/17 0305 02/20/17 0733  BP: (!) 109/59 (!) 146/79 (!) 146/77 138/75  Pulse: (!) 106 (!) 105 96 94  Resp: 19 19 19 16   Temp: 99.4 F (37.4 C) 99.1 F (37.3 C) 98.9 F (37.2 C) 98.8 F (37.1 C)  TempSrc: Oral Oral Oral Oral  SpO2: 100% 92% 100% 99%  Weight:      Height:        PHYSICAL EXAM: Left shoulder:   Neurovascular intact Sensation intact distally Intact pulses distally Incision: dressing C/D/I No cellulitis present Compartment soft   Lower extremities:  Patient currently plantarflex and dorsiflex his ankles. He has palpable pedal pulses. Patient has intact sensation to light touch bilaterally.  Patient has resolving paresthesias. He is more feeling in the left foot compared to the right at this time.  LABS  Results for orders placed or performed during the hospital encounter of 02/19/17 (from the past 24 hour(s))  Glucose, capillary     Status: Abnormal   Collection Time: 02/19/17  3:13 PM  Result Value Ref Range   Glucose-Capillary 245 (H) 65 - 99 mg/dL  Glucose, capillary     Status: Abnormal   Collection Time: 02/19/17  5:44 PM  Result Value Ref Range   Glucose-Capillary 194 (H) 65 - 99 mg/dL  Glucose, capillary     Status: Abnormal   Collection Time: 02/19/17  9:03 PM  Result Value Ref Range   Glucose-Capillary 266 (H) 65 - 99 mg/dL  CBC     Status: Abnormal   Collection Time: 02/20/17  2:50 AM  Result Value Ref Range   WBC 9.0 3.8 - 10.6 K/uL   RBC 3.69 (L) 4.40 - 5.90 MIL/uL   Hemoglobin 10.3 (L) 13.0 - 18.0 g/dL   HCT 32.4 (L) 40.0 - 52.0 %   MCV 87.9 80.0 - 100.0 fL   MCH 28.0 26.0 - 34.0 pg   MCHC 31.8 (L) 32.0 - 36.0 g/dL    RDW 15.1 (H) 11.5 - 14.5 %   Platelets 213 150 - 440 K/uL  Basic metabolic panel     Status: Abnormal   Collection Time: 02/20/17  2:50 AM  Result Value Ref Range   Sodium 136 135 - 145 mmol/L   Potassium 4.2 3.5 - 5.1 mmol/L   Chloride 107 101 - 111 mmol/L   CO2 25 22 - 32 mmol/L   Glucose, Bld 242 (H) 65 - 99 mg/dL   BUN 31 (H) 6 - 20 mg/dL   Creatinine, Ser 1.79 (H) 0.61 - 1.24 mg/dL   Calcium 8.2 (L) 8.9 - 10.3 mg/dL   GFR calc non Af Amer 37 (L) >60 mL/min   GFR calc Af Amer 43 (L) >60 mL/min   Anion gap 4 (L) 5 - 15  Glucose, capillary     Status: Abnormal   Collection Time: 02/20/17  7:35 AM  Result Value Ref Range   Glucose-Capillary 226 (H) 65 - 99 mg/dL  Glucose, capillary     Status: Abnormal   Collection Time: 02/20/17 11:58 AM  Result Value Ref Range   Glucose-Capillary 259 (H) 65 - 99 mg/dL    No  results found.  Assessment/Plan: 1 Day Post-Op   Active Problems:   S/P rotator cuff surgery  Patient is improving. His motor function was return of both feet and his paresthesias are improving. Patient states that he felt unsteady with physical therapy this morning. His wife does not feel she is capable of getting him up at home. Physical therapy has recommended skilled nursing. Patient continue physical and occupational therapy while he is here. She will wear the sling on the left shoulder at all times. He is not to actively forward elevate or abduct his left shoulder. Patient will follow up with me in 1 week for reevaluation, suture removal and wound check.    [name] , MD 02/20/2017, 1:56 PM

## 2017-02-20 NOTE — Clinical Social Work Note (Signed)
Clinical Social Work Assessment  Patient Details  Name: Adam [name] MRN: [id number] Date of Birth: 1950-10-21  Date of referral:  02/20/17               Reason for consult:  Facility Placement                Permission sought to share information with:  Chartered certified accountant granted to share information::  Yes, Verbal Permission Granted  Name::      [place]::   [place]   Relationship::     Contact Information:     Housing/Transportation Living arrangements for the past 2 months:  [place] of Information:  Patient, Adult Children, Spouse Patient Interpreter Needed:  None Criminal Activity/Legal Involvement Pertinent to Current Situation/Hospitalization:  No - Comment as needed Significant Relationships:  Adult Children, Spouse Lives with:  Spouse Do you feel safe going back to the place where you live?  Yes Need for family participation in patient care:  Yes (Comment)  Care giving concerns:  Patient lives with his wife Adam [name] in [place].    Social Worker assessment / plan:  Holiday representative (CSW) received verbal consult from PT that recommendation is SNF. CSW met with patient and his wife Adam [name] and daughter Adam [name] were at bedside. Patient was alert and oriented X4 and was sitting up in the chair at bedside. CSW introduced self and explained role of CSW department. Patient reported that he lives in [place] with his wife. CSW explained SNF process. Patient is agreeable to SNF search in [facility]. FL2 complete and faxed out. CSW presented bed offers to patient and his family. Per patient's daughter Adam [name] she will have to do a site visit to [facility] before she can make a decision. CSW made Adam [name] aware that a decision will have to be made soon so it can be arranged for tomorrow. CSW will continue to follow and assist as needed.    Daughter toured [facility] and accepted bed offer. Plan is for  patient to D/C to [facility] tomorrow to room 507 pending medical clearance. CSW sent D/C summary to [facility] today via [place].    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   Patient/Family's Understanding of and Emotional Response to Diagnosis, Current Treatment, and Prognosis:  Patient and his family were very pleasant and thanked CSW for assistance.   Emotional Assessment Appearance:  Appears stated age Attitude/Demeanor/Rapport:    Affect (typically observed):  Accepting, Adaptable, Pleasant Orientation:  Oriented to Self, Oriented to Place, Oriented to  Time, Oriented to Situation Alcohol / Substance use:  Not Applicable Psych involvement (Current and /or in the community):  No (Comment)  Discharge Needs  Concerns to be addressed:  Discharge Planning Concerns Readmission within the last 30 days:  No Current discharge risk:  Dependent with Mobility Barriers to Discharge:  Continued Medical Work up   [employer], Veronia Beets, LCSW 02/20/2017, 4:39 PM

## 2017-02-20 NOTE — Evaluation (Signed)
Occupational Therapy Evaluation Patient Details Name: Adam [name] MRN: [id number] DOB: 1951-01-21 Today's Date: 02/20/2017    History of Present Illness Pt is a 66 y/o Male who was admitted with a L full thickness RTC, s/p L shoulder arthroscopy with open RTC repair, subacrominal decompression, lysis of adhesions and distal clavicle excision.  Following surgery pt. with LE weakness and tingling and numbness. Pt's PMH includes: venous insufficiency, cataract.    Clinical Impression   Pt. presents with LUE immobilization, weakness, and limited mobility which limit his ability to complete ADLs, and IADL tasks. Pt. Reported no pain during the initial eval, however reports pain at its worst has been 8/10. Pt. Resides at home with his wife, and was independent with ADLs, IADLs, and worked as a Teacher, early years/pre. Pt. Education was provided about A/E use for LE dressing, problem-solved through UE, and LE dressing, and brace management. Pt. Could benefit from OT services for ADL training, A/E training, and pt. Education about home modification, and DME. Pt. Would benefit from SNF level of care upon discharge, with follow-up OT services.    Follow Up Recommendations  SNF    Equipment Recommendations       Recommendations for Other Services       Precautions / Restrictions Precautions Precautions: Fall;Shoulder Type of Shoulder Precautions: Sling at all times except ADL/exercise, NWB, AROM elbow wrist and hand to tolerance, No PROM of shoulder, No AROM of shoulder Shoulder Interventions: Shoulder abduction pillow;Shoulder sling/immobilizer Precaution Booklet Issued: No Restrictions Weight Bearing Restrictions: Yes LUE Weight Bearing: Non weight bearing      Mobility Bed Mobility               General bed mobility comments: Not tested; up in chair  Transfers Overall transfer level: Needs assistance Equipment used: Hemi-walker Transfers: Sit to/from [employer] Sit to Stand: Mod assist         General transfer comment: Mobility per PT.    Balance Overall balance assessment: Needs assistance Sitting-balance support: No upper extremity supported;Feet supported Sitting balance-Leahy Scale: Good     Standing balance support: Single extremity supported Standing balance-Leahy Scale: Poor                             ADL either performed or assessed with clinical judgement   ADL Overall ADL's : Needs assistance/impaired Eating/Feeding: Set up;Minimal assistance   Grooming: Set up;Minimal assistance   Upper Body Bathing: Maximal assistance   Lower Body Bathing: Maximal assistance   Upper Body Dressing : Maximal assistance   Lower Body Dressing: Maximal assistance               Functional mobility during ADLs: Maximal assistance General ADL Comments: Pt. education was provided about A/E use for LE ADLs.     Vision Baseline Vision/History: Wears glasses Patient Visual Report: No change from baseline       Perception     Praxis      Pertinent Vitals/Pain Pain Assessment: No/denies pain     Hand Dominance Right   Extremity/Trunk Assessment Upper Extremity Assessment Upper Extremity Assessment: LUE deficits/detail LUE: Unable to fully assess due to immobilization           Communication Communication Communication: No difficulties   Cognition Arousal/Alertness: Awake/alert Behavior During Therapy: WFL for tasks assessed/performed Overall Cognitive Status: Within Functional Limits for tasks assessed  General Comments       Exercises   Shoulder Instructions     Home Living Family/patient expects to be discharged to:: Private residence Living Arrangements: Spouse/significant other Available Help at Discharge: Family;Available 24 hours/day Type of Home: House Home Access: Stairs to enter [employer] of Steps: 2 Entrance  Stairs-Rails: Left Home Layout: One level     Bathroom Shower/Tub: Tub/shower unit;Curtain     Bathroom Accessibility: Yes   Home Equipment: Cane - single point;Grab bars - tub/shower          Prior Functioning/Environment Level of Independence: Independent        Comments: Independent with ADLs, and IADLs.        OT Problem List: Decreased strength;Decreased range of motion;Pain;Decreased knowledge of use of DME or AE;Decreased coordination;Impaired balance (sitting and/or standing);Impaired UE functional use;Decreased activity tolerance      OT Treatment/Interventions: Therapeutic activities;Patient/family education;Therapeutic exercise;Self-care/ADL training;DME and/or AE instruction    OT Goals(Current goals can be found in the care plan section)    OT Frequency: Min 1X/week   Barriers to D/C:            Co-evaluation              AM-PAC PT "6 Clicks" Daily Activity     Outcome Measure Help from another person eating meals?: A Little Help from another person taking care of personal grooming?: A Little Help from another person toileting, which includes using toliet, bedpan, or urinal?: A Lot Help from another person bathing (including washing, rinsing, drying)?: A Lot Help from another person to put on and taking off regular upper body clothing?: A Little Help from another person to put on and taking off regular lower body clothing?: A Lot 6 Click Score: 15   End of Session Equipment Utilized During Treatment: Gait belt  Activity Tolerance: Patient tolerated treatment well Patient left: in chair;with call bell/phone within reach;with chair alarm set;with family/visitor present  OT Visit Diagnosis: Muscle weakness (generalized) (M62.81)                Time: 7616-0737 OT Time Calculation (min): 49 min Charges:  OT General Charges $OT Visit: 1 Visit OT Evaluation $OT Eval Moderate Complexity: 1 Mod G-Codes: OT G-codes **NOT FOR INPATIENT  CLASS** Functional Limitation: Self care Self Care Current Status (T0626): At least 60 percent but less than 80 percent impaired, limited or restricted Self Care Goal Status (R4854): At least 20 percent but less than 40 percent impaired, limited or restricted  Harrel Carina, MS, OTR/L   Harrel Carina, MS, OTR/L 02/20/2017, 3:24 PM

## 2017-02-20 NOTE — Progress Notes (Signed)
Physical Therapy Treatment Patient Details Name: Adam [name] MRN: [id number] DOB: [birth year] Today's Date: 02/20/2017    History of Present Illness Pt is a 66 y/o M with L full thickness RTC, s/p L shoulder arthroscopy with open RTC repair, subacrominal decompression, lysis of adhesions and distal clavicle excision.  Following surgery pt with LE weakness and tingling and numbness. Pt's PMH includes venous insufficiency, cataract.     PT Comments    Pt agreeable to PT; reports symptoms in bilateral lower extremities continues with the right being worse than the left. Pt demonstrates difficulty with STS transfers with heavy lean to the right. Pt participates in seated exercises without issue; stand exercises demonstrate difficulty with balance/proprioception with static and dynamic stand. Continue PT to progress stand balance to improve functional mobility to baseline.    Follow Up Recommendations  SNF     Equipment Recommendations       Recommendations for Other Services       Precautions / Restrictions Precautions Precautions: Fall;Shoulder Type of Shoulder Precautions: Sling at all times except ADL/exercise, NWB, AROM elbow wrist and hand to tolerance, No PROM of shoulder, No AROM of shoulder Shoulder Interventions: Shoulder abduction pillow;Shoulder sling/immobilizer Precaution Booklet Issued: No Restrictions Weight Bearing Restrictions: Yes LUE Weight Bearing: Non weight bearing    Mobility  Bed Mobility               General bed mobility comments: Not tested; up in chair  Transfers Overall transfer level: Needs assistance Equipment used: Hemi-walker Transfers: Sit to/from [employer] Sit to Stand: Mod assist         General transfer comment: Heavy lean to the right; knee to knee bracing with descent for improved control  Ambulation/Gait                 Stairs            Wheelchair Mobility    Modified Rankin (Stroke  Patients Only)       Balance Overall balance assessment: Needs assistance Sitting-balance support: No upper extremity supported;Feet supported Sitting balance-Leahy Scale: Good     Standing balance support: Single extremity supported Standing balance-Leahy Scale: Poor                              Cognition Arousal/Alertness: Awake/alert Behavior During Therapy: WFL for tasks assessed/performed Overall Cognitive Status: Within Functional Limits for tasks assessed                                        Exercises General Exercises - Lower Extremity Quad Sets: Strengthening;Both;20 reps;Standing Long Arc Quad: AROM;Both;20 reps;Seated Hip Flexion/Marching: AROM;Both;20 reps;Seated Toe Raises: AROM;Both;20 reps;Seated Heel Raises: AROM;Both;20 reps;Seated Other Exercises Other Exercises: Stand march 2 x 10, Min to Mod A Other Exercises: R/L wt shift 20x  Other Exercises: small sidestepping in from of chair; 3 steps R/L eac 10x , Min A  Other Exercises: Static stand finding balance/righting    General Comments        Pertinent Vitals/Pain Pain Assessment: No/denies pain    Home Living                      Prior Function            PT Goals (current goals can now be found in the care  plan section) Progress towards PT goals: Progressing toward goals(slowly)    Frequency    BID      PT Plan Current plan remains appropriate    Co-evaluation              AM-PAC PT "6 Clicks" Daily Activity  Outcome Measure  Difficulty turning over in bed (including adjusting bedclothes, sheets and blankets)?: Unable Difficulty moving from lying on back to sitting on the side of the bed? : Unable Difficulty sitting down on and standing up from a chair with arms (e.g., wheelchair, bedside commode, etc,.)?: Unable Help needed moving to and from a bed to chair (including a wheelchair)?: A Lot Help needed walking in hospital room?: A  Lot Help needed climbing 3-5 steps with a railing? : Total 6 Click Score: 8    End of Session Equipment Utilized During Treatment: Gait belt Activity Tolerance: Patient tolerated treatment well Patient left: in chair;with call bell/phone within reach;with chair alarm set;with family/visitor present   PT Visit Diagnosis: Pain;Muscle weakness (generalized) (M62.81);Difficulty in walking, not elsewhere classified (R26.2);Other abnormalities of gait and mobility (R26.89);Unsteadiness on feet (R26.81) Pain - Right/Left: Left Pain - part of body: Shoulder     Time: 2111-7356 PT Time Calculation (min) (ACUTE ONLY): 33 min  Charges:  $Therapeutic Exercise: 8-22 mins $Neuromuscular Re-education: 8-22 mins                    G Codes:        Larae Grooms, PTA 02/20/2017, 3:14 PM

## 2017-02-20 NOTE — Progress Notes (Addendum)
Inpatient Diabetes Program Recommendations  AACE/ADA: New Consensus Statement on Inpatient Glycemic Control (2015)  Target Ranges:  Prepandial:   less than 140 mg/dL      Peak postprandial:   less than 180 mg/dL (1-2 hours)      Critically ill patients:  140 - 180 mg/dL   Lab Results  Component Value Date   GLUCAP 226 (H) 02/20/2017   HGBA1C 10.6 (H) 11/18/2016    Review of Glycemic Control  Results for CAN, LUCCI (MRN [id number]) as of 02/20/2017 10:52  Ref. Range 02/19/2017 12:36 02/19/2017 15:13 02/19/2017 17:44 02/19/2017 21:03 02/20/2017 07:35  Glucose-Capillary Latest Ref Range: 65 - 99 mg/dL 210 (H) 245 (H) 194 (H) 266 (H) 226 (H)    Diabetes history: Type 2 Outpatient Diabetes medications: Humalog mix 75/25 50 units qam, 100 units pre-supper,Glipizide 10mg  bid, Metformin 1000mg  bid- confirmed with patient  Current orders for Inpatient glycemic control: Glipizide 10mg  bid, Metformin 1000mg  bid, Novolog 0-15 units tid  Inpatient Diabetes Program Recommendations: Please add Lantus 30 units qhs (basal dose at home is 112.5units).   Please add Novolog 6 units tid with meals- continue Novolog correction (sliding scale) as ordered (uses 37.5 units of mealtime at home).   American diabetes Association recommends d/c oral diabetes agents while inpatient (particularly Glipizide as it has the potential for hypoglycemia).    Per ADA recommendations "consider performing an A1C on all patients with diabetes or hyperglycemia admitted to the hospital if not performed in the prior 3 months".   Spoke to patient by phone- confirms he does take his medications as ordered. Left message with Dr. Harden Mo service regarding recommendations.   Gentry Fitz, RN, BA, MHA, CDE Diabetes Coordinator Inpatient Diabetes Program  [phone] (Team Pager) [phone] ([place]) 02/20/2017 11:05 AM

## 2017-02-20 NOTE — Clinical Social Work Placement (Signed)
   CLINICAL SOCIAL WORK PLACEMENT  NOTE  Date:  02/20/2017  Patient Details  Name: Adam [name] MRN: [id number] Date of Birth: 09-20-1950  Clinical Social Work is seeking post-discharge placement for this patient at the [place] level of care (*CSW will initial, date and re-position this form in  chart as items are completed):  Yes   Patient/family provided with [facility] Work Department's list of facilities offering this level of care within the geographic area requested by the patient (or if unable, by the patient's family).  Yes   Patient/family informed of their freedom to choose among providers that offer the needed level of care, that participate in Medicare, Medicaid or managed care program needed by the patient, have an available bed and are willing to accept the patient.  Yes   Patient/family informed of [facility]'s ownership interest in [facility] and [facility], as well as of the fact that they are under no obligation to receive care at these facilities.  PASRR submitted to EDS on 02/20/17     PASRR number received on 02/20/17     Existing PASRR number confirmed on       FL2 transmitted to all facilities in geographic area requested by pt/family on 02/20/17     FL2 transmitted to all facilities within larger geographic area on       Patient informed that his/her managed care company has contracts with or will negotiate with certain facilities, including the following:            Patient/family informed of bed offers received.  Patient chooses bed at       Physician recommends and patient chooses bed at      Patient to be transferred to   on  .  Patient to be transferred to facility by       Patient family notified on   of transfer.  Name of family member notified:        PHYSICIAN       Additional Comment:    _______________________________________________ Sway Guttierrez, Veronia Beets, LCSW 02/20/2017, 4:38 PM

## 2017-02-20 NOTE — Discharge Summary (Signed)
Physician Discharge Summary  Patient ID: Adam [name] MRN: [id number] DOB/AGE: December 02, 1950 66 y.o.  Admit date: 02/19/2017 Discharge date: 02/20/2017  Admission Diagnoses:  M75.122 Complete rotator-cuff tear/ruptr of left shoulder, not trauma <principal problem not specified>  Discharge Diagnoses:  M75.122 Complete rotator-cuff tear/ruptr of left shoulder, not trauma Active Problems:   S/P rotator cuff surgery Lower extremity weakness and numbness post-op likely due to beachchair positioning during surgery causing sciatic nerve compression  Past Medical History:  Diagnosis Date  . Cataract   . Diabetes mellitus type II 2002   Hospitalized for very high sugars  . Diverticulosis of colon   . GERD (gastroesophageal reflux disease)   . History of colonic polyps    Hyperplastic  . Hyperlipidemia   . Hypertension   . Phimosis 2003   Repair  . Venous insufficiency     Surgeries: Procedure(s): SHOULDER ARTHROSCOPY WITH MNI OPEN ROTATOR CUFF REPAIR WITH PATCH PLACEMENT,SUBACROMINAL DECOMPRESSION,LYSIS OF ADHESIONS, DISTAL CLAVICLE EXCISION on 02/19/2017   Consultants (if any):   Discharged Condition: Improved  Hospital Course: Adam [name] is an 66 y.o. male who was admitted 02/19/2017 with a diagnosis of  Complete rotator-cuff tear of left shoulder and went to the operating room on 02/19/2017 and underwent a successful repair of his large, retracted rotator cuff tear.    He was given perioperative antibiotics:  Anti-infectives (From admission, onward)   Start     Dose/Rate Route Frequency Ordered Stop   02/19/17 1700  vancomycin (VANCOCIN) IVPB 1000 mg/200 mL premix     1,000 mg 200 mL/hr over 60 Minutes Intravenous Every 12 hours 02/19/17 1653 02/19/17 1916   02/19/17 0600  vancomycin (VANCOCIN) 1,500 mg in sodium chloride 0.9 % 500 mL IVPB  Status:  Discontinued     1,500 mg 250 mL/hr over 120 Minutes Intravenous On call to O.R. 02/18/17 2248 02/19/17 1635    . He  should receive physical therapy and occupational therapy evaluation beginning on postop day #1. His motor function improved overnight. On postop day 1 he had full ankle range of motion with 5 out of 5 strength of plantarflexion dorsiflexion. Flex and extend his toes. Patient still had mild paresthesias on postop day 1 in both feet right greater than left.  His tenderness physical therapy evaluation, physical therapist recommended the patient go to a skilled nursing facility given his lower extremity weakness and unsteadiness on his feet. The patient is a significant fall risk.  Given his clinical improvement he was prepared for discharge on postop day #2 to skilled nursing facility.  He was given sequential compression devices, early ambulation, and lovenox for DVT prophylaxis.  He benefited maximally from the hospital stay and there were no complications.    Recent vital signs:  Vitals:   02/20/17 0305 02/20/17 0733  BP: (!) 146/77 138/75  Pulse: 96 94  Resp: 19 16  Temp: 98.9 F (37.2 C) 98.8 F (37.1 C)  SpO2: 100% 99%    Recent laboratory studies:  Lab Results  Component Value Date   HGB 10.3 (L) 02/20/2017   HGB 11.3 (L) 02/18/2017   HGB 12.0 (L) 12/27/2016   Lab Results  Component Value Date   WBC 9.0 02/20/2017   PLT 213 02/20/2017   Lab Results  Component Value Date   INR 0.93 02/18/2017   Lab Results  Component Value Date   NA 136 02/20/2017   K 4.2 02/20/2017   CL 107 02/20/2017   CO2 25 02/20/2017   BUN  31 (H) 02/20/2017   CREATININE 1.79 (H) 02/20/2017   GLUCOSE 242 (H) 02/20/2017    Discharge Medications:   Allergies as of 02/20/2017      Reactions   Cephalexin Rash      Medication List    STOP taking these medications   HYDROcodone-homatropine 5-1.5 MG/5ML syrup Commonly known as:  HYCODAN     TAKE these medications   aspirin 81 MG tablet Take 81 mg by mouth daily.   atorvastatin 80 MG tablet Commonly known as:  LIPITOR TAKE ONE TABLET  BY MOUTH ONCE DAILY   B-D INS SYR MICROFINE 1CC/28G 28G X 1/2" 1 ML Misc Generic drug:  INS SYRINGE/NEEDLE 1CC/28G INJECT 100 UNITS SQ AS DIRECTED TWICE DAILY   enoxaparin 40 MG/0.4ML injection Commonly known as:  LOVENOX Inject 0.4 mLs (40 mg total) into the skin daily.   furosemide 80 MG tablet Commonly known as:  LASIX TAKE 1 TABLET BY MOUTH  DAILY   glipiZIDE 10 MG tablet Commonly known as:  GLUCOTROL Take 1 tablet (10 mg total) by mouth 2 (two) times daily before a meal.   glucose blood test strip Commonly known as:  ONETOUCH VERIO Use as instructed to check blood sugars twice daily. Dx code: E11.65, Z79.4, E11.65 Insulin dependent type 2 diabetes   insulin lispro protamine-lispro (75-25) 100 UNIT/ML Susp injection Commonly known as:  HUMALOG MIX 75/25 50 units before breakfast and 100 units before supper   lisinopril-hydrochlorothiazide 20-25 MG tablet Commonly known as:  PRINZIDE,ZESTORETIC Take 1 tablet by mouth daily.   metFORMIN 1000 MG tablet Commonly known as:  GLUCOPHAGE Take 1 tablet (1,000 mg total) by mouth 2 (two) times daily with a meal.   ondansetron 4 MG tablet Commonly known as:  ZOFRAN Take 1 tablet (4 mg total) by mouth every 8 (eight) hours as needed for nausea or vomiting.   oxyCODONE 5 MG immediate release tablet Commonly known as:  Oxy IR/ROXICODONE Take 1 tablet (5 mg total) by mouth every 4 (four) hours as needed for severe pain.   oxymetazoline 0.05 % nasal spray Commonly known as:  AFRIN Place 1 spray into both nostrils at bedtime.       Diagnostic Studies: Dg Chest 2 View  Result Date: 01/30/2017 CLINICAL DATA:  Cough for 3 weeks EXAM: CHEST  2 VIEW COMPARISON:  12/27/2016 FINDINGS: The heart size and mediastinal contours are within normal limits. Both lungs are clear. The visualized skeletal structures are unremarkable. IMPRESSION: No active cardiopulmonary disease. Electronically Signed   By: Inez Catalina M.D.   On: 01/30/2017 13:51     Disposition: Skilled nursing facility  Discharge Instructions    Call MD / Call 911   Complete by:  As directed    If you experience chest pain or shortness of breath, CALL 911 and be transported to the hospital emergency room.  If you develope a fever above 101 F, pus (white drainage) or increased drainage or redness at the wound, or calf pain, call your surgeon's office.                 Constipation Prevention   Complete by:  As directed    Drink plenty of fluids.  Prune juice may be helpful.  You may use a stool softener, such as Colace (over the counter) 100 mg twice a day.  Use MiraLax (over the counter) for constipation as needed.   Diet - low sodium heart healthy   Complete by:  As directed  Discharge instructions   Complete by:  As directed    Wear sling at all times, including sleep.  You will need to use the sling for a total of 4 weeks following surgery.  Do not try and lift your arm up or away from your body for any reason.   Keep the dressing dry.  You may remove bandage in 3 days.  Leave the Steri-Strips (white medical tape) in place.  You may place additional Band-Aids over top of the Steri-Strips if you wish.  May shower once dressing is removed in 3 days.  Remove sling carefully only for showers, leaving arm down by your side while in the shower.  If the the pain medication causes itching, or is too strong, try taking a single tablet at a time, or combining with Benadryl.  You may be most comfortable sleeping in a recliner.  If you do sleep in near bed, placed pillows behind the shoulder that have the operation to support it.   Discharge instructions   Complete by:  As directed    NOTE TO SKILLED NURSING FACILITY:  Patient should wear of the left shoulder sling at all times. He should not actively forward elevate or abduct the left shoulder.  Patient should remain in the sling until he follows up with me in 1 week for suture removal. Patient needs to sleep  in the sling.  Patient should continue physical and occupational therapy at skilled nursing facility.  He may require a single hand walker for assistance with balance. Patient has had paresthesias and lower extremity weakness postop likely due to sciatic nerve compression during surgery from beachchair positioning.  Patient may remove his dressing in 3 days.  He may use his Polar Care over his left shoulder to reduce swelling prn.   Driving restrictions   Complete by:  As directed    No driving for 4 weeks          Increase activity slowly as tolerated   Complete by:  As directed         Lifting restrictions   Complete by:  As directed    No lifting for 12-16 weeks             Follow-up Information    [name], MD Follow up.   Specialty:  Orthopedic Surgery Why:  January 3 at 4:15pm January 7 at 11:45 for Physical Therapy Contact information: [place] [address] [phone]            Signed: [name] ,MD 02/20/2017, 2:11 PM

## 2017-02-20 NOTE — NC FL2 (Signed)
[place] LEVEL OF CARE SCREENING TOOL     IDENTIFICATION  Patient Name: Adam [name] Birthdate: 10-21-1950 Sex: male Admission Date (Current Location): 02/19/2017  [place] and [place] Number:  Engineering geologist and Address:  [facility], [address] [place], [place] [address]      Provider Number: [id number]  Attending Physician Name and Address:  [name], MD  Relative Name and Phone Number:       Current Level of Care: Hospital Recommended Level of Care: [place] Prior Approval Number:    Date Approved/Denied:   PASRR Number: [id number])  Discharge Plan: SNF    Current Diagnoses: Patient Active Problem List   Diagnosis Date Noted  . S/P rotator cuff surgery 02/19/2017  . Pre-op evaluation 01/30/2017  . Cough 01/30/2017  . BPH (benign prostatic hyperplasia) 05/13/2016  . Sleep disturbance 05/01/2015  . Chronic diastolic heart failure ([place]) 02/02/2015  . Advance directive discussed with patient 11/07/2014  . Chronic venous insufficiency 01/07/2013  . Type 2 diabetes mellitus with neurological manifestations, uncontrolled ([place]) 06/26/2011  . Routine general medical examination at a health care facility 06/17/2010  . Hyperlipemia 06/09/2006  . Essential hypertension, benign 06/09/2006  . GERD 06/09/2006  . DIVERTICULOSIS, COLON 06/09/2006  . COLONIC POLYPS, HX OF 06/09/2006    Orientation RESPIRATION BLADDER Height & Weight     Self, Time, Situation, Place  O2(2 Liters Oxygen. ) Continent Weight: 274 lb (124.3 kg) Height:  6' (182.9 cm)  BEHAVIORAL SYMPTOMS/MOOD NEUROLOGICAL BOWEL NUTRITION STATUS      Continent Diet(Regular Diet. )  AMBULATORY STATUS COMMUNICATION OF NEEDS Skin   Extensive Assist Verbally Surgical wounds(Incision: Left Shoulder. )                       Personal Care Assistance Level of Assistance  Bathing, Feeding, Dressing Bathing Assistance: Limited  assistance Feeding assistance: Independent Dressing Assistance: Limited assistance     Functional Limitations Info  Sight, Hearing, Speech Sight Info: Adequate Hearing Info: Adequate Speech Info: Adequate    SPECIAL CARE FACTORS FREQUENCY  PT (By licensed PT), OT (By licensed OT)     PT Frequency: (5) OT Frequency: (5)            Contractures      Additional Factors Info  Code Status, Allergies Code Status Info: (Full Code. ) Allergies Info: (Cephalexin)           Current Medications (02/20/2017):  This is the current hospital active medication list Current Facility-Administered Medications  Medication Dose Route Frequency Provider Last Rate Last Dose  . 0.9 %  sodium chloride infusion   Intravenous Continuous [name], MD 75 mL/hr at 02/19/17 1800    . acetaminophen (TYLENOL) tablet 650 mg  650 mg Oral Q4H PRN [name], MD       Or  . acetaminophen (TYLENOL) suppository 650 mg  650 mg Rectal Q4H PRN [name], MD      . alum & mag hydroxide-simeth (MAALOX/MYLANTA) 200-200-20 MG/5ML suspension 30 mL  30 mL Oral Q4H PRN [name], MD      . aspirin EC tablet 81 mg  81 mg Oral Daily [name], MD   81 mg at 02/20/17 0853  . atorvastatin (LIPITOR) tablet 80 mg  80 mg Oral Daily [name], MD   80 mg at 02/20/17 0854  . bisacodyl (DULCOLAX) suppository 10 mg  10 mg Rectal Daily PRN [name], MD      .  diphenhydrAMINE (BENADRYL) 12.5 MG/5ML elixir 12.5-25 mg  12.5-25 mg Oral Q4H PRN [name], MD      . docusate sodium (COLACE) capsule 100 mg  100 mg Oral BID [name], MD   100 mg at 02/20/17 0854  . furosemide (LASIX) tablet 80 mg  80 mg Oral Daily [name], MD      . glipiZIDE (GLUCOTROL) tablet 10 mg  10 mg Oral BID AC [name], MD   10 mg at 02/20/17 0854  . lisinopril (PRINIVIL,ZESTRIL) tablet 20 mg  20 mg Oral Daily [name], MD       And  . hydrochlorothiazide (HYDRODIURIL)  tablet 25 mg  25 mg Oral Daily [name], MD      . HYDROcodone-homatropine [facility]) 5-1.5 MG/5ML syrup 5 mL  5 mL Oral QHS PRN [name], MD      . HYDROmorphone (DILAUDID) injection 0.5 mg  0.5 mg Intravenous Q2H PRN [name], MD      . insulin aspart (novoLOG) injection 0-15 Units  0-15 Units Subcutaneous TID WC [name], MD   5 Units at 02/20/17 [phone]  . magnesium citrate solution 1 Bottle  1 Bottle Oral Once PRN [name], MD      . menthol-cetylpyridinium (CEPACOL) lozenge 3 mg  1 lozenge Oral PRN [name], MD       Or  . phenol (CHLORASEPTIC) mouth spray 1 spray  1 spray Mouth/Throat PRN [name], MD      . metFORMIN (GLUCOPHAGE) tablet 1,000 mg  1,000 mg Oral BID WC [name], MD   1,000 mg at 02/20/17 0854  . methocarbamol (ROBAXIN) tablet 500 mg  500 mg Oral Q6H PRN [name], MD       Or  . methocarbamol (ROBAXIN) 500 mg in dextrose 5 % 50 mL IVPB  500 mg Intravenous Q6H PRN [name], MD      . metoCLOPramide (REGLAN) tablet 5-10 mg  5-10 mg Oral Q8H PRN [name], MD       Or  . metoCLOPramide (REGLAN) injection 5-10 mg  5-10 mg Intravenous Q8H PRN [name], MD      . ondansetron [facility]) tablet 4 mg  4 mg Oral Q6H PRN [name], MD       Or  . ondansetron [facility]) injection 4 mg  4 mg Intravenous Q6H PRN [name], MD      . oxyCODONE (Oxy IR/ROXICODONE) immediate release tablet 10 mg  10 mg Oral Q3H PRN [name], MD      . oxyCODONE (Oxy IR/ROXICODONE) immediate release tablet 5 mg  5 mg Oral Q3H PRN [name], MD   5 mg at 02/20/17 0853  . oxymetazoline (AFRIN) 0.05 % nasal spray 1 spray  1 spray Each Nare QHS [name], MD   1 spray at 02/19/17 2126  . polyethylene glycol (MIRALAX / GLYCOLAX) packet 17 g  17 g Oral Daily PRN [name], MD      . senna [facility]) tablet 8.6 mg  1 tablet Oral BID [name], MD   8.6 mg at 02/20/17 0853  .  zolpidem (AMBIEN) tablet 5 mg  5 mg Oral QHS PRN [name], MD         Discharge Medications: Please see discharge summary for a list of discharge medications.  Relevant Imaging Results:  Relevant Lab Results:   Additional Information (SSN: [id number])  Taeya Theall, Veronia Beets, LCSW

## 2017-02-21 DIAGNOSIS — M75122 Complete rotator cuff tear or rupture of left shoulder, not specified as traumatic: Secondary | ICD-10-CM | POA: Diagnosis not present

## 2017-02-21 LAB — GLUCOSE, CAPILLARY
GLUCOSE-CAPILLARY: 254 mg/dL — AB (ref 65–99)
Glucose-Capillary: 209 mg/dL — ABNORMAL HIGH (ref 65–99)
Glucose-Capillary: 296 mg/dL — ABNORMAL HIGH (ref 65–99)

## 2017-02-21 NOTE — Care Management Note (Signed)
Case Management Note  Patient Details  Name: Adam [name] MRN: [id number] Date of Birth: 06-19-1950  Subjective/Objective:    [name] is refusing  SNF and prefers to go home with home health. On call Dr Harlow Mares is ordering HH=PT and a hemiwalker. A referral for HH=PT and a hemiwalker was called to [place] per family choice.  Wife's cell was provided to Melene Muller, [phone].               Action/Plan:   Expected Discharge Date:  02/21/17               Expected Discharge Plan:     In-House Referral:     Discharge planning Services     Post Acute Care Choice:    Choice offered to:     DME Arranged:    DME Agency:     HH Arranged:    HH Agency:     Status of Service:     If discussed at [employer], dates discussed:    Additional Comments:  Anajah Sterbenz A, RN 02/21/2017, 12:18 PM

## 2017-02-21 NOTE — Clinical Social Work Note (Signed)
CSW spoke with the patient, his daughter, and his spouse at bedside about discharge to [facility] today. The patient and his family stated that he has decided to return home rather than discharge to SNF as he feels safer with this plan today. The family indicated that the patient's MD is aware. The CSW has updated the [facility] that the patient will need home health orders. CSW is signing off. Please consult should new needs arise.  Adam [name], MSW, Latanya Presser [phone]

## 2017-02-21 NOTE — Progress Notes (Signed)
Physical Therapy Treatment Patient Details Name: Adam [name] MRN: [id number] DOB: 12-10-1950 Today's Date: 02/21/2017    History of Present Illness Pt is a 66 y/o Male who was admitted with a L full thickness RTC, s/p L shoulder arthroscopy with open RTC repair, subacrominal decompression, lysis of adhesions and distal clavicle excision.  Following surgery pt. with LE weakness and tingling and numbness. Pt's PMH includes: venous insufficiency, cataract.     PT Comments    Pt in chair.  Stated he is feeling better and is going home.  Stood with min guard and was able to ambulate 100' with hemiwalker and min guard.  No LOB noted.  Stated feeling is returning to LE's and he feels comfortable with discharge home today.  He will need a Hemiwalker for home.   Follow Up Recommendations  Home health PT     Equipment Recommendations  Other (comment)  Hemiwalker   Recommendations for Other Services       Precautions / Restrictions Precautions Precautions: Fall;Shoulder Type of Shoulder Precautions: Sling at all times except ADL/exercise, NWB, AROM elbow wrist and hand to tolerance, No PROM of shoulder, No AROM of shoulder Precaution Booklet Issued: No Restrictions Weight Bearing Restrictions: Yes LUE Weight Bearing: Non weight bearing    Mobility  Bed Mobility               General bed mobility comments: Not tested; up in chair  Transfers Overall transfer level: Needs assistance Equipment used: Hemi-walker Transfers: Sit to/from [facility] Sit to Stand: Min guard Stand pivot transfers: Min guard          Ambulation/Gait Ambulation/Gait assistance: Min guard Ambulation Distance (Feet): 100 Feet Assistive device: Hemi-walker   Gait velocity: decreased Gait velocity interpretation: Below normal speed for age/gender General Gait Details: overall improved since yesterday   Stairs            Wheelchair Mobility    Modified Rankin  (Stroke Patients Only)       Balance Overall balance assessment: Needs assistance Sitting-balance support: No upper extremity supported;Feet supported Sitting balance-Leahy Scale: Good     Standing balance support: Single extremity supported Standing balance-Leahy Scale: Fair Standing balance comment: Pt relies on UE support and outside physical assist during static and dynamic activities.                             Cognition Arousal/Alertness: Awake/alert Behavior During Therapy: WFL for tasks assessed/performed Overall Cognitive Status: Within Functional Limits for tasks assessed                                        Exercises      General Comments        Pertinent Vitals/Pain Pain Assessment: No/denies pain    Home Living                      Prior Function            PT Goals (current goals can now be found in the care plan section) Progress towards PT goals: Progressing toward goals    Frequency    BID      PT Plan Discharge plan needs to be updated    Co-evaluation              AM-PAC PT "6  Clicks" Daily Activity  Outcome Measure  Difficulty turning over in bed (including adjusting bedclothes, sheets and blankets)?: Unable Difficulty moving from lying on back to sitting on the side of the bed? : Unable Difficulty sitting down on and standing up from a chair with arms (e.g., wheelchair, bedside commode, etc,.)?: A Little Help needed moving to and from a bed to chair (including a wheelchair)?: A Little Help needed walking in hospital room?: A Little Help needed climbing 3-5 steps with a railing? : A Little 6 Click Score: 14    End of Session Equipment Utilized During Treatment: Gait belt Activity Tolerance: Patient tolerated treatment well Patient left: in chair;with call bell/phone within reach;with chair alarm set;with family/visitor present   Pain - Right/Left: Left Pain - part of body: Shoulder      Time: 1423-9532 PT Time Calculation (min) (ACUTE ONLY): 8 min  Charges:  $Gait Training: 8-22 mins                    G Codes:      Chesley Noon, PTA 02/21/17, 12:20 PM

## 2017-02-21 NOTE — Progress Notes (Signed)
Patient sitting up in chair. Sling in place on left shoulder. Family at bedside. No complaints at this time. Continue to monitor.

## 2017-02-21 NOTE — Progress Notes (Signed)
Patient discharged home. Family at bedside and to transport patient home. Instructions and prescription given to patient, verbalized understanding.

## 2017-02-23 ENCOUNTER — Encounter: Payer: Self-pay | Admitting: Orthopedic Surgery

## 2017-02-25 ENCOUNTER — Telehealth: Payer: Self-pay | Admitting: Internal Medicine

## 2017-02-25 NOTE — Telephone Encounter (Signed)
Copied from [place] [id number]. Topic: Quick Communication - See Telephone Encounter >> Feb 25, 2017 12:26 PM Hewitt Shorts wrote: CRM for notification. See Telephone encounter for:  Pt is needing to discuss a possible change in his insulin and metformin dosage -he had an episode last night that it dropped to 45 and EMS had to be called to get the sugar level to go back up  Best number [phone] 02/25/17.

## 2017-02-25 NOTE — Telephone Encounter (Signed)
I spoke with Ms Adam [name]; last night BS dropped and pt was sweaty and confused; pt did not know where he was. Pt had eaten dinner but his appetite is not as good as prior to surgery. Pt has been taking metformin bid and insulin 50 u in AM and 100 u at hs. Last night EMS had to give IV to get BS up to 92. This morning FBS was 165. Dr Silvio Pate out of office. [name] has not given insulin today until receive cb with possible changing of dose of insulin or metformin.Please advise.

## 2017-02-25 NOTE — Telephone Encounter (Signed)
Spoke to pt and advised per [facility]

## 2017-02-25 NOTE — Telephone Encounter (Signed)
Cut evening Lispro to 75 units before supper.

## 2017-03-06 LAB — HM DIABETES EYE EXAM

## 2017-03-25 ENCOUNTER — Encounter: Payer: Self-pay | Admitting: Internal Medicine

## 2017-03-25 NOTE — Progress Notes (Signed)
03-06-17 

## 2017-04-12 ENCOUNTER — Other Ambulatory Visit: Payer: Self-pay | Admitting: Internal Medicine

## 2017-04-14 ENCOUNTER — Other Ambulatory Visit: Payer: Self-pay | Admitting: Internal Medicine

## 2017-04-20 ENCOUNTER — Other Ambulatory Visit: Payer: Self-pay | Admitting: Internal Medicine

## 2017-05-25 ENCOUNTER — Ambulatory Visit (INDEPENDENT_AMBULATORY_CARE_PROVIDER_SITE_OTHER): Payer: Medicare Other | Admitting: Internal Medicine

## 2017-05-25 ENCOUNTER — Encounter: Payer: Self-pay | Admitting: Internal Medicine

## 2017-05-25 VITALS — BP 138/98 | HR 65 | Temp 97.4°F | Ht 71.5 in | Wt 276.0 lb

## 2017-05-25 DIAGNOSIS — Z0000 Encounter for general adult medical examination without abnormal findings: Secondary | ICD-10-CM

## 2017-05-25 DIAGNOSIS — Z7189 Other specified counseling: Secondary | ICD-10-CM

## 2017-05-25 DIAGNOSIS — E1165 Type 2 diabetes mellitus with hyperglycemia: Secondary | ICD-10-CM

## 2017-05-25 DIAGNOSIS — N183 Chronic kidney disease, stage 3 unspecified: Secondary | ICD-10-CM | POA: Insufficient documentation

## 2017-05-25 DIAGNOSIS — IMO0002 Reserved for concepts with insufficient information to code with codable children: Secondary | ICD-10-CM

## 2017-05-25 DIAGNOSIS — I5032 Chronic diastolic (congestive) heart failure: Secondary | ICD-10-CM

## 2017-05-25 DIAGNOSIS — Z1211 Encounter for screening for malignant neoplasm of colon: Secondary | ICD-10-CM

## 2017-05-25 DIAGNOSIS — I10 Essential (primary) hypertension: Secondary | ICD-10-CM | POA: Diagnosis not present

## 2017-05-25 DIAGNOSIS — E1149 Type 2 diabetes mellitus with other diabetic neurological complication: Secondary | ICD-10-CM

## 2017-05-25 LAB — LIPID PANEL
Cholesterol: 169 mg/dL (ref 0–200)
HDL: 32.7 mg/dL — AB (ref >39.00)
NONHDL: 136.79
Total CHOL/HDL Ratio: 5
Triglycerides: 204 mg/dL — ABNORMAL HIGH (ref 0.0–149.0)
VLDL: 40.8 mg/dL — ABNORMAL HIGH (ref 0.0–40.0)

## 2017-05-25 LAB — COMPREHENSIVE METABOLIC PANEL
ALK PHOS: 58 U/L (ref 39–117)
ALT: 21 U/L (ref 0–53)
AST: 24 U/L (ref 0–37)
Albumin: 3.3 g/dL — ABNORMAL LOW (ref 3.5–5.2)
BILIRUBIN TOTAL: 0.2 mg/dL (ref 0.2–1.2)
BUN: 34 mg/dL — ABNORMAL HIGH (ref 6–23)
CALCIUM: 9 mg/dL (ref 8.4–10.5)
CO2: 27 meq/L (ref 19–32)
CREATININE: 1.53 mg/dL — AB (ref 0.40–1.50)
Chloride: 101 mEq/L (ref 96–112)
GFR: 58.44 mL/min — AB (ref >60.00)
GLUCOSE: 235 mg/dL — AB (ref 70–99)
Potassium: 4.1 mEq/L (ref 3.5–5.1)
Sodium: 135 mEq/L (ref 135–145)
TOTAL PROTEIN: 8 g/dL (ref 6.0–8.3)

## 2017-05-25 LAB — CBC
HCT: 33.9 % — ABNORMAL LOW (ref 39.0–52.0)
HEMOGLOBIN: 11.1 g/dL — AB (ref 13.0–17.0)
MCHC: 32.7 g/dL (ref 30.0–36.0)
MCV: 86.8 fl (ref 78.0–100.0)
PLATELETS: 253 10*3/uL (ref 150.0–400.0)
RBC: 3.91 Mil/uL — ABNORMAL LOW (ref 4.22–5.81)
RDW: 14.7 % (ref 11.5–15.5)
WBC: 9 10*3/uL (ref 4.0–10.5)

## 2017-05-25 LAB — HEMOGLOBIN A1C: HEMOGLOBIN A1C: 11.9 % — AB (ref 4.6–6.5)

## 2017-05-25 LAB — LDL CHOLESTEROL, DIRECT: Direct LDL: 102 mg/dL

## 2017-05-25 NOTE — Assessment & Plan Note (Addendum)
Has been compliant with insulin Goal under 9% for him---but not likely He didn't care for going to endocrinologist Limited by finances Foot numbness actually some better

## 2017-05-25 NOTE — Assessment & Plan Note (Signed)
BMI around 38 with diabetes, CHF and HTN, etc Discussed goal of 10# weight loss over next 6 months
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
## 2017-05-25 NOTE — Assessment & Plan Note (Signed)
See social history 

## 2017-05-25 NOTE — Assessment & Plan Note (Signed)
BP Readings from Last 3 Encounters:  05/25/17 (!) 138/98  02/21/17 134/77  02/18/17 (!) 170/97   Up slightly Will hold off on changes for now

## 2017-05-25 NOTE — Assessment & Plan Note (Signed)
I have personally reviewed the Medicare Annual Wellness questionnaire and have noted 1. The patient's medical and social history 2. Their use of alcohol, tobacco or illicit drugs 3. Their current medications and supplements 4. The patient's functional ability including ADL's, fall risks, home safety risks and hearing or visual             impairment. 5. Diet and physical activities 6. Evidence for depression or mood disorders  The patients weight, height, BMI and visual acuity have been recorded in the chart I have made referrals, counseling and provided education to the patient based review of the above and I have provided the pt with a written personalized care plan for preventive services.  I have provided you with a copy of your personalized plan for preventive services. Please take the time to review along with your updated medication list.  Yearly flu vaccine Consider 1 last PSA next year Discussed lifestyle FIT Consider shingrix

## 2017-05-25 NOTE — Assessment & Plan Note (Signed)
If creatinine still up, will refer to [facility] for evaluation

## 2017-05-25 NOTE — Progress Notes (Signed)
Hearing Screening   Method: Audiometry   125Hz 250Hz 500Hz 1000Hz 2000Hz 3000Hz 4000Hz 6000Hz 8000Hz  Right ear:   20 20 20  20    Left ear:   20 20 20  20    Vision Screening Comments: January 2019   

## 2017-05-25 NOTE — Assessment & Plan Note (Signed)
Compensated Continue current Rx

## 2017-05-26 ENCOUNTER — Telehealth: Payer: Self-pay | Admitting: Internal Medicine

## 2017-05-26 NOTE — Telephone Encounter (Signed)
I already called him back. It was about a lab. We got disconnected.

## 2017-05-26 NOTE — Telephone Encounter (Signed)
Copied from [place] [id number]. Topic: Quick Communication - See Telephone Encounter >> May 26, 2017  9:54 AM Cleaster Corin, NT wrote: CRM for notification. See Telephone encounter for: 05/26/17.  Pt. Calling back stating that a nurse had called but didn't see any notes put in pt. Cb# [phone]

## 2017-06-21 ENCOUNTER — Other Ambulatory Visit: Payer: Self-pay | Admitting: Internal Medicine

## 2017-07-05 ENCOUNTER — Other Ambulatory Visit: Payer: Self-pay | Admitting: Internal Medicine

## 2017-07-21 ENCOUNTER — Other Ambulatory Visit: Payer: Self-pay | Admitting: Orthopedic Surgery

## 2017-07-21 DIAGNOSIS — M25512 Pain in left shoulder: Secondary | ICD-10-CM

## 2017-08-05 ENCOUNTER — Other Ambulatory Visit: Payer: Self-pay | Admitting: Orthopedic Surgery

## 2017-08-05 ENCOUNTER — Ambulatory Visit
Admission: RE | Admit: 2017-08-05 | Discharge: 2017-08-05 | Disposition: A | Discharge status: Home / Self Care | Payer: Medicare Other | Source: Ambulatory Visit | Attending: Orthopedic Surgery | Admitting: Orthopedic Surgery

## 2017-08-05 DIAGNOSIS — M25712 Osteophyte, left shoulder: Secondary | ICD-10-CM | POA: Diagnosis not present

## 2017-08-05 DIAGNOSIS — M75122 Complete rotator cuff tear or rupture of left shoulder, not specified as traumatic: Secondary | ICD-10-CM | POA: Diagnosis not present

## 2017-08-05 DIAGNOSIS — M25512 Pain in left shoulder: Secondary | ICD-10-CM | POA: Diagnosis present

## 2017-08-05 DIAGNOSIS — M19012 Primary osteoarthritis, left shoulder: Secondary | ICD-10-CM | POA: Diagnosis not present

## 2017-08-05 MED ORDER — LIDOCAINE HCL (PF) 1 % IJ SOLN
5.0000 mL | Freq: Once | INTRAMUSCULAR | Status: AC
  Administered 2017-08-05: 5 mL
  Filled 2017-08-05: qty 5

## 2017-08-05 MED ORDER — IOPAMIDOL (ISOVUE-200) INJECTION 41%
15.0000 mL | Freq: Once | INTRAVENOUS | Status: AC | PRN
  Administered 2017-08-05: 6 mL
  Filled 2017-08-05: qty 50

## 2017-08-05 MED ORDER — SODIUM CHLORIDE 0.9 % IJ SOLN
5.0000 mL | INTRAMUSCULAR | Status: DC | PRN
  Administered 2017-08-05: 6 mL
  Filled 2017-08-05: qty 10

## 2017-08-17 ENCOUNTER — Encounter: Payer: Self-pay | Admitting: Internal Medicine

## 2017-08-17 ENCOUNTER — Ambulatory Visit (INDEPENDENT_AMBULATORY_CARE_PROVIDER_SITE_OTHER): Payer: Medicare Other | Admitting: Internal Medicine

## 2017-08-17 VITALS — BP 128/78 | HR 96 | Temp 98.3°F | Ht 71.5 in

## 2017-08-17 DIAGNOSIS — IMO0002 Reserved for concepts with insufficient information to code with codable children: Secondary | ICD-10-CM

## 2017-08-17 DIAGNOSIS — E1165 Type 2 diabetes mellitus with hyperglycemia: Secondary | ICD-10-CM

## 2017-08-17 DIAGNOSIS — N509 Disorder of male genital organs, unspecified: Secondary | ICD-10-CM | POA: Diagnosis not present

## 2017-08-17 DIAGNOSIS — N5089 Other specified disorders of the male genital organs: Secondary | ICD-10-CM | POA: Insufficient documentation

## 2017-08-17 DIAGNOSIS — E1149 Type 2 diabetes mellitus with other diabetic neurological complication: Secondary | ICD-10-CM

## 2017-08-17 LAB — POCT GLYCOSYLATED HEMOGLOBIN (HGB A1C): HEMOGLOBIN A1C: 11.9 % — AB (ref 4.0–5.6)

## 2017-08-17 LAB — HM DIABETES FOOT EXAM

## 2017-08-17 MED ORDER — SITAGLIPTIN PHOSPHATE 100 MG PO TABS: 100.0000 mg | ORAL_TABLET | Freq: Every day | ORAL | 3 refills | Status: DC

## 2017-08-17 NOTE — Assessment & Plan Note (Addendum)
It has been near 3 months--will check today instead of another visit next week Lab Results  Component Value Date   HGBA1C 11.9 (A) 08/17/2017   No better Will add [place] if he can afford
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
## 2017-08-17 NOTE — Patient Instructions (Signed)
Please start the januvia (sitagliptin). Let me know if it is too much money to fill.

## 2017-08-17 NOTE — Assessment & Plan Note (Signed)
At spot of past drain Doesn't really look neoplastic  Will set up with urology to evaluate

## 2017-08-21 ENCOUNTER — Other Ambulatory Visit: Payer: Self-pay | Admitting: Orthopedic Surgery

## 2017-08-25 ENCOUNTER — Ambulatory Visit: Payer: Medicare Other | Admitting: Internal Medicine

## 2017-09-21 ENCOUNTER — Other Ambulatory Visit: Payer: Self-pay | Admitting: Internal Medicine

## 2017-10-06 ENCOUNTER — Ambulatory Visit: Payer: Self-pay | Admitting: Urology

## 2017-10-13 LAB — HM DIABETES EYE EXAM

## 2017-10-21 ENCOUNTER — Ambulatory Visit: Payer: Self-pay

## 2017-10-21 NOTE — Telephone Encounter (Signed)
Incoming call from Patient and Patients wife.  Complaint of low blood sugar sx. - + States that he was outside working in the yard, became shaky.  Came in the house The house wife checked blood sugar it  Was 41 at 11:00am. Patient had checked blood sugar at 956am  It was 61. Patient states he at breakfast.  Because he at breakfast.  He felt that he soul take all diabetic medications as ordered.  Patient's wife states he has been ' eating better". Wife feels that Patient diabetic medications needs to be adjusted.  Made  Appointment for evaluation.  Appointment scheduled for  8/92/19@ 4:00pm.  Patient voiced understanding .  Patient's wife.  Reviewed some of the bloods glucose values with me. Once he started the Januvia 100mg .                                                                                                                                                                                                                                                                                                                                                                                                  Reason for Disposition . [1] Low blood glucose (< 70 mg/dL  or 3.9 mmol/L) with no other adult present AND [2] hasn't tried Care Advice  Answer Assessment - Initial Assessment Questions 1. SYMPTOMS: "What symptoms are you concerned about?"    Feeling weak, shaky 2. ONSET:  "When did the symptoms start?"     Stared 2 weeks getting worse 3. BLOOD GLUCOSE: "What is your blood glucose level?"       See notes  4. USUAL RANGE: "What is your blood glucose level usually?" (e.g., usual fasting morning value, usual evening value)     *No Answer* 5. TYPE 1 or 2:  "Do you know what type of diabetes you have?"  (e.g., Type 1, Type 2, Gestational; doesn't know)      *No Answer* 6. INSULIN: "Do you take insulin?" "What type of insulin(s) do you use? What is the mode of delivery? (syringe, pen (e.g., injection or   pump)      *No Answer* 7. DIABETES PILLS: "Do you take any pills for your diabetes?"     *No Answer* 8. OTHER SYMPTOMS: "Do you have any symptoms?" (e.g., fever, frequent urination, difficulty breathing, vomiting)    Denies 9. LOW BLOOD GLUCOSE TREATMENT: "What have you done so far to treat the low blood glucose level?"     Peanut butter and Orange juice 10. FOOD: "When did you last eat or drink?"       break 11. ALONE: "Are you alone right now or is someone with you?"        With wife 12. PREGNANCY: "Is there any chance you are pregnant?" "When was your last menstrual period?"       Na  Protocols used: DIABETES - LOW BLOOD SUGAR-A-AH

## 2017-10-21 NOTE — Telephone Encounter (Signed)
Spoke to pt's wife. Will see him tomorrow

## 2017-10-21 NOTE — Telephone Encounter (Signed)
Have him hold the glipizide pending tomorrow's meeting Will see how A1c is doing

## 2017-10-22 ENCOUNTER — Ambulatory Visit: Payer: Medicare Other | Admitting: Internal Medicine

## 2017-10-22 ENCOUNTER — Encounter: Payer: Self-pay | Admitting: Internal Medicine

## 2017-10-22 VITALS — BP 130/82 | HR 80 | Temp 98.2°F | Ht 71.5 in | Wt 280.0 lb

## 2017-10-22 DIAGNOSIS — E162 Hypoglycemia, unspecified: Secondary | ICD-10-CM

## 2017-10-22 MED ORDER — GLIPIZIDE 10 MG PO TABS: 5.0000 mg | ORAL_TABLET | Freq: Every day | ORAL | 0 refills | Status: DC
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
## 2017-10-22 NOTE — Assessment & Plan Note (Signed)
It doesn't make sense that he goes from A1c of 11.9% to hypoglycemia with the addition of just sitagliptin (or any single medication). Will have him cut back on the glipizide for now Continue same doses or other medications Will stop the glipizide entirely if his sugars still stay low Counseling about this for entire 15 minute visit

## 2017-10-22 NOTE — Patient Instructions (Signed)
Please cut the glipizide pill in half and only take 1/2 tab (5mg ) before breakfast--and don't take it in the evening. If your sugars stay low (under 100 and feeling bad), stop it completely.

## 2017-11-23 ENCOUNTER — Encounter: Payer: Self-pay | Admitting: Internal Medicine

## 2017-11-23 ENCOUNTER — Ambulatory Visit: Payer: Medicare Other | Admitting: Internal Medicine

## 2017-11-23 VITALS — BP 138/80 | HR 85 | Temp 98.0°F | Ht 72.0 in | Wt 277.0 lb

## 2017-11-23 DIAGNOSIS — I5032 Chronic diastolic (congestive) heart failure: Secondary | ICD-10-CM

## 2017-11-23 DIAGNOSIS — I10 Essential (primary) hypertension: Secondary | ICD-10-CM

## 2017-11-23 DIAGNOSIS — R351 Nocturia: Secondary | ICD-10-CM

## 2017-11-23 DIAGNOSIS — E1165 Type 2 diabetes mellitus with hyperglycemia: Secondary | ICD-10-CM | POA: Diagnosis not present

## 2017-11-23 DIAGNOSIS — E1149 Type 2 diabetes mellitus with other diabetic neurological complication: Secondary | ICD-10-CM | POA: Diagnosis not present

## 2017-11-23 DIAGNOSIS — N401 Enlarged prostate with lower urinary tract symptoms: Secondary | ICD-10-CM

## 2017-11-23 DIAGNOSIS — Z23 Encounter for immunization: Secondary | ICD-10-CM | POA: Diagnosis not present

## 2017-11-23 DIAGNOSIS — IMO0002 Reserved for concepts with insufficient information to code with codable children: Secondary | ICD-10-CM

## 2017-11-23 LAB — POCT GLYCOSYLATED HEMOGLOBIN (HGB A1C): Hemoglobin A1C: 8.5 % — AB (ref 4.0–5.6)

## 2017-11-23 NOTE — Patient Instructions (Signed)
Stop the glipizide completely. Let me know if your sugars start running much higher (we will restart at 2.5mg )

## 2017-11-23 NOTE — Assessment & Plan Note (Signed)
BP Readings from Last 3 Encounters:  11/23/17 138/80  10/22/17 130/82  08/17/17 128/78   Good control
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
## 2017-11-23 NOTE — Assessment & Plan Note (Signed)
Much better overall control Lab Results  Component Value Date   HGBA1C 8.5 (A) 11/23/2017   Now with some high sugars, but also more frequent lows Will stop the glipizide completely (had reduced to 5mg  daily) If starts running up a lot, will add back 2.5mg 

## 2017-11-23 NOTE — Assessment & Plan Note (Signed)
Nocturia x 2 or so No sig daytime symptoms No Rx needed

## 2017-11-23 NOTE — Assessment & Plan Note (Signed)
Chronic edema but not really exacerbated Suspect the easier fatigue is heat related--will see what happens when the temperatures finally drop some

## 2017-11-24 ENCOUNTER — Ambulatory Visit: Payer: Medicare Other | Admitting: Internal Medicine

## 2018-02-22 NOTE — Addendum Note (Signed)
Addended by: Pilar Grammes on: 02/22/2018 12:56 PM   Modules accepted: Orders

## 2018-04-01 ENCOUNTER — Ambulatory Visit: Payer: Medicare Other | Admitting: Internal Medicine

## 2018-04-01 ENCOUNTER — Encounter: Payer: Self-pay | Admitting: Internal Medicine

## 2018-04-01 VITALS — BP 140/82 | HR 95 | Temp 97.9°F | Ht 71.5 in | Wt 280.0 lb

## 2018-04-01 DIAGNOSIS — R29898 Other symptoms and signs involving the musculoskeletal system: Secondary | ICD-10-CM | POA: Diagnosis not present
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
## 2018-04-01 NOTE — Assessment & Plan Note (Signed)
Seems to be mostly quad weakness Nothing to suggest it is related to hips, neurologic condition or vascular No shoulder girdle problems (other than chronic left shoulder pain). Picture is not consistent with PMR  Will set up with PT to work on quad strengthening

## 2018-04-15 ENCOUNTER — Other Ambulatory Visit: Payer: Self-pay | Admitting: Internal Medicine

## 2018-05-22 ENCOUNTER — Other Ambulatory Visit: Payer: Self-pay | Admitting: Internal Medicine

## 2018-06-08 ENCOUNTER — Ambulatory Visit: Admission: RE | Admit: 2018-06-08 | Payer: Medicare Other | Source: Home / Self Care | Admitting: Ophthalmology

## 2018-06-08 ENCOUNTER — Encounter: Admission: RE | Payer: Self-pay | Source: Home / Self Care

## 2018-06-08 SURGERY — PHACOEMULSIFICATION, CATARACT, WITH IOL INSERTION
Anesthesia: Choice | Laterality: Left

## 2018-07-04 ENCOUNTER — Other Ambulatory Visit: Payer: Self-pay | Admitting: Internal Medicine

## 2018-07-22 ENCOUNTER — Other Ambulatory Visit: Payer: Self-pay

## 2018-07-22 ENCOUNTER — Ambulatory Visit (INDEPENDENT_AMBULATORY_CARE_PROVIDER_SITE_OTHER): Payer: Medicare Other | Admitting: Family Medicine

## 2018-07-22 ENCOUNTER — Encounter: Payer: Self-pay | Admitting: Family Medicine

## 2018-07-22 VITALS — BP 158/92 | HR 87 | Temp 98.4°F | Resp 20 | Ht 71.5 in | Wt 276.0 lb

## 2018-07-22 DIAGNOSIS — I872 Venous insufficiency (chronic) (peripheral): Secondary | ICD-10-CM

## 2018-07-22 DIAGNOSIS — L309 Dermatitis, unspecified: Secondary | ICD-10-CM | POA: Diagnosis not present

## 2018-07-22 DIAGNOSIS — I10 Essential (primary) hypertension: Secondary | ICD-10-CM | POA: Diagnosis not present

## 2018-07-22 DIAGNOSIS — I5032 Chronic diastolic (congestive) heart failure: Secondary | ICD-10-CM

## 2018-07-22 NOTE — Patient Instructions (Signed)
For foot sore:   1) Try hydrocortisone Cream -- over the counter 2) Use 2-3 times per day on the area 3) Should improve within 2 weeks  Watch for fevers, chills, worsening pain or redness of the area

## 2018-07-22 NOTE — Assessment & Plan Note (Signed)
Wt is stable and pt feels edema is stable. His compression stockings were worn out. Advised getting new pair. No other signs of fluid overload on exam today.

## 2018-07-22 NOTE — Progress Notes (Signed)
Subjective:     Adam [name] is a 68 y.o. male presenting for Edema (noticed a sore area on top of right foot yesterday 07/21/2018. No fever. Edema has been present for a few months off and on.)     HPI   #Sore on foot - noticed it a couple of days ago - started out itching - will start to itch if he touches it - has diabetes, but endorses good sensation in feet - usually gets feet taken care of at a salon  #edema - feels it is at baseline - will get better with moving around - some days better than others - taking lasix  - does not do daily weights  -    Review of Systems  Constitutional: Negative for chills and fever.  Respiratory: Negative for chest tightness and shortness of breath.   Cardiovascular: Positive for leg swelling. Negative for chest pain and palpitations.     Social History   Tobacco Use  Smoking Status Former Smoker  . Years: 37.00  . Types: Cigarettes  . Last attempt to quit: 02/24/1998  . Years since quitting: 20.4  Smokeless Tobacco Never Used        Objective:    BP Readings from Last 3 Encounters:  07/22/18 (!) 158/92  04/01/18 140/82  11/23/17 138/80   Wt Readings from Last 3 Encounters:  07/22/18 276 lb (125.2 kg)  04/01/18 280 lb (127 kg)  11/23/17 277 lb (125.6 kg)    BP (!) 158/92 Comment: on re check  Pulse 87   Temp 98.4 F (36.9 C)   Resp 20   Ht 5' 11.5" (1.816 m)   Wt 276 lb (125.2 kg)   SpO2 99%   BMI 37.96 kg/m    Physical Exam Constitutional:      Appearance: Normal appearance. He is obese.  HENT:     Head: Normocephalic and atraumatic.     Right Ear: External ear normal.     Left Ear: External ear normal.     Nose: Nose normal.  Eyes:     Conjunctiva/sclera: Conjunctivae normal.  Cardiovascular:     Rate and Rhythm: Normal rate and regular rhythm.     Heart sounds: Murmur present.  Pulmonary:     Effort: Pulmonary effort is normal. No respiratory distress.     Breath sounds: No wheezing or  rales.  Musculoskeletal:     Comments: B/l 2+ pitting edema to the ankle, and then 1+ to the knee  Skin:    General: Skin is warm and dry.     Comments: Top of right foot with some papular/pustule lesions which appear scabbed. No erythema noted. No sign of ulcers.   Neurological:     General: No focal deficit present.     Mental Status: He is alert.  Psychiatric:        Mood and Affect: Mood normal.        Thought Content: Thought content normal.           Assessment & Plan:   Problem List Items Addressed This Visit      Cardiovascular and Mediastinum   Essential hypertension, benign    BP elevated today, though had been normal in the past. Already on 2 medications and has PCP f/u next week. Advised home monitoring and discuss with pcp in 1 week      Chronic venous insufficiency   Chronic diastolic heart failure (HCC)    Wt is stable and pt  feels edema is stable. His compression stockings were worn out. Advised getting new pair. No other signs of fluid overload on exam today.         Musculoskeletal and Integument   Dermatitis - Primary    Given papular/pustule lesion and itchy symptoms - suspect dermatitis. Treat with topical steroids. Return precautions if worsening or not improving.           Return if symptoms worsen or fail to improve.  Lesleigh Noe, MD

## 2018-07-22 NOTE — Assessment & Plan Note (Signed)
Given papular/pustule lesion and itchy symptoms - suspect dermatitis. Treat with topical steroids. Return precautions if worsening or not improving.

## 2018-07-22 NOTE — Assessment & Plan Note (Signed)
BP elevated today, though had been normal in the past. Already on 2 medications and has PCP f/u next week. Advised home monitoring and discuss with pcp in 1 week

## 2018-07-23 ENCOUNTER — Telehealth: Payer: Self-pay

## 2018-07-23 NOTE — Telephone Encounter (Signed)
Spoke to the pharmacist. Novolin 70/30 is the combination of short and long. $22 a vial in the Relion brand.  Novolin R if you just want short acting.

## 2018-07-23 NOTE — Telephone Encounter (Signed)
-----   Message from [name], MD sent at 07/22/2018  1:34 PM EDT ----- Please check with pharmacist----is there any better alternatives (plain NPH/regular mix or even just plain NPH?) ----- Message ----- From: Lesleigh Noe, MD Sent: 07/22/2018  12:49 PM EDT To: [name], MD  FYI - he sees you next week. He said that due to insurance changes his insulin went from $80 every 90 days to >$1200. I told him I'd let you know in case you wanted to see if there were other options.

## 2018-07-24 NOTE — Telephone Encounter (Signed)
Okay Send a prescription for that Should probably start at the same 100 units bid and he should keep a close eye on his sugars (check twice a day) for a week or so to see how he is doing (may have to adjust up---but I doubt down)

## 2018-07-26 MED ORDER — INSULIN NPH ISOPHANE & REGULAR (70-30) 100 UNIT/ML ~~LOC~~ SUSP: SUBCUTANEOUS | 11 refills | Status: DC

## 2018-07-26 NOTE — Telephone Encounter (Signed)
Left a message on VM for pt letting him know that we sent in the new insulin to [facility] and to continue to take it as he was his previous.

## 2018-07-28 ENCOUNTER — Ambulatory Visit (INDEPENDENT_AMBULATORY_CARE_PROVIDER_SITE_OTHER): Payer: Medicare Other | Admitting: Internal Medicine

## 2018-07-28 ENCOUNTER — Encounter: Payer: Self-pay | Admitting: Internal Medicine

## 2018-07-28 VITALS — BP 126/66 | Ht 71.5 in | Wt 276.0 lb

## 2018-07-28 DIAGNOSIS — N183 Chronic kidney disease, stage 3 unspecified: Secondary | ICD-10-CM

## 2018-07-28 DIAGNOSIS — Z1211 Encounter for screening for malignant neoplasm of colon: Secondary | ICD-10-CM

## 2018-07-28 DIAGNOSIS — I872 Venous insufficiency (chronic) (peripheral): Secondary | ICD-10-CM

## 2018-07-28 DIAGNOSIS — I10 Essential (primary) hypertension: Secondary | ICD-10-CM | POA: Diagnosis not present

## 2018-07-28 DIAGNOSIS — E1149 Type 2 diabetes mellitus with other diabetic neurological complication: Secondary | ICD-10-CM | POA: Diagnosis not present

## 2018-07-28 DIAGNOSIS — I5032 Chronic diastolic (congestive) heart failure: Secondary | ICD-10-CM

## 2018-07-28 DIAGNOSIS — R251 Tremor, unspecified: Secondary | ICD-10-CM | POA: Insufficient documentation

## 2018-07-28 DIAGNOSIS — Z7189 Other specified counseling: Secondary | ICD-10-CM

## 2018-07-28 DIAGNOSIS — Z0000 Encounter for general adult medical examination without abnormal findings: Secondary | ICD-10-CM

## 2018-07-28 DIAGNOSIS — Z125 Encounter for screening for malignant neoplasm of prostate: Secondary | ICD-10-CM

## 2018-07-28 DIAGNOSIS — IMO0002 Reserved for concepts with insufficient information to code with codable children: Secondary | ICD-10-CM

## 2018-07-28 DIAGNOSIS — E1165 Type 2 diabetes mellitus with hyperglycemia: Secondary | ICD-10-CM

## 2018-07-28 NOTE — Assessment & Plan Note (Signed)
On ACEI  Will recheck labs

## 2018-07-28 NOTE — Assessment & Plan Note (Signed)
Hopefully still acceptable control (under 9%) Wife asks about trulicity--might be good but would have to stop [place] and I wonder about cost Better would be adding canagoflozin---but doubt he could afford it Mild neuropathy now

## 2018-07-28 NOTE — Assessment & Plan Note (Signed)
I have personally reviewed the Medicare Annual Wellness questionnaire and have noted 1. The patient's medical and social history 2. Their use of alcohol, tobacco or illicit drugs 3. Their current medications and supplements 4. The patient's functional ability including ADL's, fall risks, home safety risks and hearing or visual             impairment. 5. Diet and physical activities 6. Evidence for depression or mood disorders  The patients weight, height, BMI and visual acuity have been recorded in the chart I have made referrals, counseling and provided education to the patient based review of the above and I have provided the pt with a written personalized care plan for preventive services.  I have provided you with a copy of your personalized plan for preventive services. Please take the time to review along with your updated medication list.  Flu vaccine in the fall Discussed PSA---will check Forgot the FIT last year--will try again Discussed fitness and strengthening
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
## 2018-07-28 NOTE — Assessment & Plan Note (Signed)
Compensated on diuretic ACEI

## 2018-07-28 NOTE — Assessment & Plan Note (Signed)
BMI 37 with diabetes, HTN, CHF, etc Working on proper eating, etc

## 2018-07-28 NOTE — Progress Notes (Signed)
Hearing Screening Comments: Not tested in the last 12 months Vision Screening Comments: August 2019

## 2018-07-28 NOTE — Assessment & Plan Note (Signed)
Better with compression hose

## 2018-07-28 NOTE — Assessment & Plan Note (Signed)
See social history 

## 2018-07-28 NOTE — Assessment & Plan Note (Signed)
Vague history Doesn't sound Parkinsonian Will need to check when next in the office

## 2018-07-28 NOTE — Assessment & Plan Note (Signed)
BP Readings from Last 3 Encounters:  07/28/18 126/66  07/22/18 (!) 158/92  04/01/18 140/82   Good when in office last week

## 2018-07-31 ENCOUNTER — Other Ambulatory Visit: Payer: Self-pay | Admitting: Internal Medicine

## 2018-08-02 ENCOUNTER — Other Ambulatory Visit (INDEPENDENT_AMBULATORY_CARE_PROVIDER_SITE_OTHER): Payer: Medicare Other

## 2018-08-02 DIAGNOSIS — Z125 Encounter for screening for malignant neoplasm of prostate: Secondary | ICD-10-CM | POA: Diagnosis not present

## 2018-08-02 DIAGNOSIS — H25012 Cortical age-related cataract, left eye: Secondary | ICD-10-CM | POA: Diagnosis not present

## 2018-08-02 DIAGNOSIS — E1149 Type 2 diabetes mellitus with other diabetic neurological complication: Secondary | ICD-10-CM | POA: Diagnosis not present

## 2018-08-02 DIAGNOSIS — E1165 Type 2 diabetes mellitus with hyperglycemia: Secondary | ICD-10-CM

## 2018-08-02 DIAGNOSIS — I509 Heart failure, unspecified: Secondary | ICD-10-CM | POA: Diagnosis not present

## 2018-08-02 DIAGNOSIS — IMO0002 Reserved for concepts with insufficient information to code with codable children: Secondary | ICD-10-CM

## 2018-08-02 LAB — CBC
HCT: 30.6 % — ABNORMAL LOW (ref 39.0–52.0)
Hemoglobin: 10 g/dL — ABNORMAL LOW (ref 13.0–17.0)
MCHC: 32.6 g/dL (ref 30.0–36.0)
MCV: 86.8 fl (ref 78.0–100.0)
Platelets: 230 10*3/uL (ref 150.0–400.0)
RBC: 3.53 Mil/uL — ABNORMAL LOW (ref 4.22–5.81)
RDW: 15.7 % — ABNORMAL HIGH (ref 11.5–15.5)
WBC: 8 10*3/uL (ref 4.0–10.5)

## 2018-08-02 LAB — COMPREHENSIVE METABOLIC PANEL
ALT: 25 U/L (ref 0–53)
AST: 26 U/L (ref 0–37)
Albumin: 3.5 g/dL (ref 3.5–5.2)
Alkaline Phosphatase: 56 U/L (ref 39–117)
BUN: 34 mg/dL — ABNORMAL HIGH (ref 6–23)
CO2: 27 mEq/L (ref 19–32)
Calcium: 8.8 mg/dL (ref 8.4–10.5)
Chloride: 107 mEq/L (ref 96–112)
Creatinine, Ser: 1.88 mg/dL — ABNORMAL HIGH (ref 0.40–1.50)
GFR: 43.2 mL/min — ABNORMAL LOW (ref >60.00)
Glucose, Bld: 80 mg/dL (ref 70–99)
Potassium: 4.8 mEq/L (ref 3.5–5.1)
Sodium: 140 mEq/L (ref 135–145)
Total Bilirubin: 0.3 mg/dL (ref 0.2–1.2)
Total Protein: 7.5 g/dL (ref 6.0–8.3)

## 2018-08-02 LAB — HEMOGLOBIN A1C: Hgb A1c MFr Bld: 9 % — ABNORMAL HIGH (ref 4.6–6.5)

## 2018-08-02 LAB — LIPID PANEL
Cholesterol: 152 mg/dL (ref 0–200)
HDL: 34.2 mg/dL — ABNORMAL LOW (ref >39.00)
LDL Cholesterol: 84 mg/dL (ref 0–99)
NonHDL: 118.22
Total CHOL/HDL Ratio: 4
Triglycerides: 172 mg/dL — ABNORMAL HIGH (ref 0.0–149.0)
VLDL: 34.4 mg/dL (ref 0.0–40.0)

## 2018-08-02 LAB — PSA, MEDICARE: PSA: 1.72 ng/ml (ref 0.10–4.00)

## 2018-08-02 LAB — T4, FREE: Free T4: 0.79 ng/dL (ref 0.60–1.60)

## 2018-08-04 ENCOUNTER — Other Ambulatory Visit: Payer: Self-pay

## 2018-08-04 ENCOUNTER — Encounter: Payer: Self-pay | Admitting: *Deleted

## 2018-08-05 NOTE — Discharge Instructions (Signed)

## 2018-08-06 ENCOUNTER — Other Ambulatory Visit: Payer: Self-pay

## 2018-08-06 ENCOUNTER — Other Ambulatory Visit
Admission: RE | Admit: 2018-08-06 | Discharge: 2018-08-06 | Disposition: A | Discharge status: Home / Self Care | Payer: Medicare Other | Source: Ambulatory Visit | Attending: Ophthalmology | Admitting: Ophthalmology

## 2018-08-06 DIAGNOSIS — Z1159 Encounter for screening for other viral diseases: Secondary | ICD-10-CM | POA: Diagnosis not present

## 2018-08-06 DIAGNOSIS — Z01812 Encounter for preprocedural laboratory examination: Secondary | ICD-10-CM | POA: Insufficient documentation

## 2018-08-07 LAB — NOVEL CORONAVIRUS, NAA (HOSP ORDER, SEND-OUT TO REF LAB; TAT 18-24 HRS): SARS-CoV-2, NAA: NOT DETECTED

## 2018-08-08 NOTE — Anesthesia Preprocedure Evaluation (Addendum)
Anesthesia Evaluation  Patient identified by MRN, date of birth, ID band Patient awake    Reviewed: Allergy & Precautions, NPO status , Patient's Chart, lab work & pertinent test results  History of Anesthesia Complications Negative for: history of anesthetic complications  Airway Mallampati: I   Neck ROM: Full    Dental  (+)    Pulmonary former smoker (quit 2000),    Pulmonary exam normal breath sounds clear to auscultation       Cardiovascular hypertension, + Peripheral Vascular Disease  Normal cardiovascular exam Rhythm:Regular Rate:Normal     Neuro/Psych negative neurological ROS     GI/Hepatic GERD  ,  Endo/Other  diabetes, Type 2, Insulin DependentObesity   Renal/GU Renal disease (stage III CKD)     Musculoskeletal   Abdominal   Peds  Hematology negative hematology ROS (+)   Anesthesia Other Findings   Reproductive/Obstetrics                           Anesthesia Physical Anesthesia Plan  ASA: III  Anesthesia Plan: MAC   Post-op Pain Management:    Induction: Intravenous  PONV Risk Score and Plan: 1 and TIVA and Midazolam  Airway Management Planned: Natural Airway  Additional Equipment:   Intra-op Plan:   Post-operative Plan:   Informed Consent: I have reviewed the patients History and Physical, chart, labs and discussed the procedure including the risks, benefits and alternatives for the proposed anesthesia with the patient or authorized representative who has indicated his/her understanding and acceptance.       Plan Discussed with: CRNA  Anesthesia Plan Comments:        Anesthesia Quick Evaluation

## 2018-08-09 ENCOUNTER — Other Ambulatory Visit (INDEPENDENT_AMBULATORY_CARE_PROVIDER_SITE_OTHER): Payer: Medicare Other

## 2018-08-09 DIAGNOSIS — Z1211 Encounter for screening for malignant neoplasm of colon: Secondary | ICD-10-CM | POA: Diagnosis not present

## 2018-08-09 LAB — FECAL OCCULT BLOOD, IMMUNOCHEMICAL: Fecal Occult Bld: NEGATIVE

## 2018-08-10 ENCOUNTER — Encounter
Admission: RE | Disposition: A | Discharge status: Home / Self Care | Payer: Self-pay | Source: Home / Self Care | Attending: Ophthalmology

## 2018-08-10 ENCOUNTER — Ambulatory Visit: Payer: Medicare Other | Admitting: Anesthesiology

## 2018-08-10 ENCOUNTER — Other Ambulatory Visit: Payer: Self-pay

## 2018-08-10 ENCOUNTER — Ambulatory Visit
Admission: RE | Admit: 2018-08-10 | Discharge: 2018-08-10 | Disposition: A | Discharge status: Home / Self Care | Payer: Medicare Other | Attending: Ophthalmology | Admitting: Ophthalmology

## 2018-08-10 DIAGNOSIS — H25812 Combined forms of age-related cataract, left eye: Secondary | ICD-10-CM | POA: Diagnosis not present

## 2018-08-10 DIAGNOSIS — Z6837 Body mass index (BMI) 37.0-37.9, adult: Secondary | ICD-10-CM | POA: Insufficient documentation

## 2018-08-10 DIAGNOSIS — E1136 Type 2 diabetes mellitus with diabetic cataract: Secondary | ICD-10-CM | POA: Insufficient documentation

## 2018-08-10 DIAGNOSIS — Z87891 Personal history of nicotine dependence: Secondary | ICD-10-CM | POA: Insufficient documentation

## 2018-08-10 DIAGNOSIS — Z794 Long term (current) use of insulin: Secondary | ICD-10-CM | POA: Insufficient documentation

## 2018-08-10 DIAGNOSIS — I509 Heart failure, unspecified: Secondary | ICD-10-CM | POA: Insufficient documentation

## 2018-08-10 DIAGNOSIS — E669 Obesity, unspecified: Secondary | ICD-10-CM | POA: Diagnosis not present

## 2018-08-10 DIAGNOSIS — Z7982 Long term (current) use of aspirin: Secondary | ICD-10-CM | POA: Diagnosis not present

## 2018-08-10 DIAGNOSIS — N183 Chronic kidney disease, stage 3 (moderate): Secondary | ICD-10-CM | POA: Diagnosis not present

## 2018-08-10 DIAGNOSIS — H2512 Age-related nuclear cataract, left eye: Secondary | ICD-10-CM | POA: Diagnosis not present

## 2018-08-10 DIAGNOSIS — E7800 Pure hypercholesterolemia, unspecified: Secondary | ICD-10-CM | POA: Insufficient documentation

## 2018-08-10 DIAGNOSIS — I110 Hypertensive heart disease with heart failure: Secondary | ICD-10-CM | POA: Insufficient documentation

## 2018-08-10 DIAGNOSIS — E1122 Type 2 diabetes mellitus with diabetic chronic kidney disease: Secondary | ICD-10-CM | POA: Diagnosis not present

## 2018-08-10 DIAGNOSIS — I130 Hypertensive heart and chronic kidney disease with heart failure and stage 1 through stage 4 chronic kidney disease, or unspecified chronic kidney disease: Secondary | ICD-10-CM | POA: Insufficient documentation

## 2018-08-10 DIAGNOSIS — E1151 Type 2 diabetes mellitus with diabetic peripheral angiopathy without gangrene: Secondary | ICD-10-CM | POA: Diagnosis not present

## 2018-08-10 DIAGNOSIS — G4730 Sleep apnea, unspecified: Secondary | ICD-10-CM | POA: Insufficient documentation

## 2018-08-10 DIAGNOSIS — H25012 Cortical age-related cataract, left eye: Secondary | ICD-10-CM | POA: Diagnosis not present

## 2018-08-10 HISTORY — PX: CATARACT EXTRACTION W/PHACO: SHX586

## 2018-08-10 LAB — GLUCOSE, CAPILLARY
Glucose-Capillary: 137 mg/dL — ABNORMAL HIGH (ref 70–99)
Glucose-Capillary: 131 mg/dL — ABNORMAL HIGH (ref 70–99)

## 2018-08-10 SURGERY — PHACOEMULSIFICATION, CATARACT, WITH IOL INSERTION
Anesthesia: Monitor Anesthesia Care | Site: Eye | Laterality: Left

## 2018-08-10 MED ORDER — FENTANYL CITRATE (PF) 100 MCG/2ML IJ SOLN
INTRAMUSCULAR | Status: DC | PRN
  Administered 2018-08-10: 50 ug via INTRAVENOUS

## 2018-08-10 MED ORDER — ARMC OPHTHALMIC DILATING DROPS
1.0000 "application " | OPHTHALMIC | Status: DC | PRN
  Administered 2018-08-10 (×3): 1 via OPHTHALMIC

## 2018-08-10 MED ORDER — MIDAZOLAM HCL 2 MG/2ML IJ SOLN
INTRAMUSCULAR | Status: DC | PRN
  Administered 2018-08-10: 2 mg via INTRAVENOUS

## 2018-08-10 MED ORDER — ONDANSETRON HCL 4 MG/2ML IJ SOLN: 4.0000 mg | Freq: Once | INTRAMUSCULAR | Status: DC | PRN

## 2018-08-10 MED ORDER — EPINEPHRINE PF 1 MG/ML IJ SOLN
INTRAOCULAR | Status: DC | PRN
  Administered 2018-08-10: 11:00:00 81 mL via OPHTHALMIC

## 2018-08-10 MED ORDER — LIDOCAINE HCL (PF) 2 % IJ SOLN
INTRAOCULAR | Status: DC | PRN
  Administered 2018-08-10: 1 mL

## 2018-08-10 MED ORDER — BRIMONIDINE TARTRATE-TIMOLOL 0.2-0.5 % OP SOLN
OPHTHALMIC | Status: DC | PRN
  Administered 2018-08-10: 1 [drp] via OPHTHALMIC

## 2018-08-10 MED ORDER — NA CHONDROIT SULF-NA HYALURON 40-17 MG/ML IO SOLN
INTRAOCULAR | Status: DC | PRN
  Administered 2018-08-10: 1 mL via INTRAOCULAR

## 2018-08-10 MED ORDER — TETRACAINE HCL 0.5 % OP SOLN
1.0000 [drp] | OPHTHALMIC | Status: DC | PRN
  Administered 2018-08-10 (×3): 1 [drp] via OPHTHALMIC

## 2018-08-10 MED ORDER — MOXIFLOXACIN HCL 0.5 % OP SOLN
OPHTHALMIC | Status: DC | PRN
  Administered 2018-08-10: 0.2 mL via OPHTHALMIC

## 2018-08-10 SURGICAL SUPPLY — 21 items
CANNULA ANT/CHMB 27G (MISCELLANEOUS) ×1 IMPLANT
CANNULA ANT/CHMB 27GA (MISCELLANEOUS) ×3 IMPLANT
GLOVE SURG LX 8.0 MICRO (GLOVE) ×4
GLOVE SURG LX STRL 8.0 MICRO (GLOVE) ×1 IMPLANT
GLOVE SURG TRIUMPH 8.0 PF LTX (GLOVE) ×3 IMPLANT
GOWN STRL REUS W/ TWL LRG LVL3 (GOWN DISPOSABLE) ×2 IMPLANT
GOWN STRL REUS W/TWL LRG LVL3 (GOWN DISPOSABLE) ×6
LENS IOL TECNIS ITEC 20.5 (Intraocular Lens) ×2 IMPLANT
MARKER SKIN DUAL TIP RULER LAB (MISCELLANEOUS) ×3 IMPLANT
NDL FILTER BLUNT 18X1 1/2 (NEEDLE) ×1 IMPLANT
NDL RETROBULBAR .5 NSTRL (NEEDLE) ×3 IMPLANT
NEEDLE FILTER BLUNT 18X 1/2SAF (NEEDLE) ×2
NEEDLE FILTER BLUNT 18X1 1/2 (NEEDLE) ×1 IMPLANT
PACK EYE AFTER SURG (MISCELLANEOUS) ×3 IMPLANT
PACK OPTHALMIC (MISCELLANEOUS) ×3 IMPLANT
PACK PORFILIO (MISCELLANEOUS) ×3 IMPLANT
SUT ETHILON 10-0 CS-B-6CS-B-6 (SUTURE)
SUTURE EHLN 10-0 CS-B-6CS-B-6 (SUTURE) IMPLANT
SYR 3ML LL SCALE MARK (SYRINGE) ×3 IMPLANT
SYR TB 1ML LUER SLIP (SYRINGE) ×3 IMPLANT
WIPE NON LINTING 3.25X3.25 (MISCELLANEOUS) ×3 IMPLANT

## 2018-08-10 NOTE — Anesthesia Procedure Notes (Signed)
Procedure Name: MAC Date/Time: 08/10/2018 10:59 AM Performed by: Janna Arch, CRNA Pre-anesthesia Checklist: Patient identified, Emergency Drugs available, Suction available, Timeout performed and Patient being monitored Patient Re-evaluated:Patient Re-evaluated prior to induction Oxygen Delivery Method: Nasal cannula Placement Confirmation: positive ETCO2

## 2018-08-10 NOTE — Anesthesia Postprocedure Evaluation (Signed)
Anesthesia Post Note  Patient: Adam [name]  Procedure(s) Performed: CATARACT EXTRACTION PHACO AND INTRAOCULAR LENS PLACEMENT (IOC) LEFT DIABETIC (Left Eye)  Patient location during evaluation: PACU Anesthesia Type: MAC Level of consciousness: awake and alert, oriented and patient cooperative Pain management: pain level controlled Vital Signs Assessment: post-procedure vital signs reviewed and stable Respiratory status: spontaneous breathing, nonlabored ventilation and respiratory function stable Cardiovascular status: blood pressure returned to baseline and stable Postop Assessment: adequate PO intake Anesthetic complications: no    Darrin Nipper

## 2018-08-10 NOTE — H&P (Signed)
All labs reviewed. Abnormal studies sent to patients PCP when indicated.  Previous H&P reviewed, patient examined, there are NO CHANGES.  Adam Kaestner Porfilio6/16/202010:54 AM

## 2018-08-10 NOTE — Transfer of Care (Signed)
Immediate Anesthesia Transfer of Care Note  Patient: Adam [name]  Procedure(s) Performed: CATARACT EXTRACTION PHACO AND INTRAOCULAR LENS PLACEMENT (IOC) LEFT DIABETIC (Left Eye)  Patient Location: PACU  Anesthesia Type: MAC  Level of Consciousness: awake, alert  and patient cooperative  Airway and Oxygen Therapy: Patient Spontanous Breathing and Patient connected to supplemental oxygen  Post-op Assessment: Post-op Vital signs reviewed, Patient's Cardiovascular Status Stable, Respiratory Function Stable, Patent Airway and No signs of Nausea or vomiting  Post-op Vital Signs: Reviewed and stable  Complications: No apparent anesthesia complications

## 2018-08-10 NOTE — Op Note (Signed)
PREOPERATIVE DIAGNOSIS:  Nuclear sclerotic cataract of the left eye.   POSTOPERATIVE DIAGNOSIS:  Nuclear sclerotic cataract of the left eye.   OPERATIVE PROCEDURE: Procedure(s): CATARACT EXTRACTION PHACO AND INTRAOCULAR LENS PLACEMENT ([place]) LEFT DIABETIC   SURGEON:  [name], MD.   ANESTHESIA:  Anesthesiologist: Darrin Nipper, MD CRNA: Janna Arch, CRNA  1.      Managed anesthesia care. 2.     0.60ml of Shugarcaine was instilled following the paracentesis   COMPLICATIONS:  None.   TECHNIQUE:   Stop and chop   DESCRIPTION OF PROCEDURE:  The patient was examined and consented in the preoperative holding area where the aforementioned topical anesthesia was applied to the left eye and then brought back to the Operating Room where the left eye was prepped and draped in the usual sterile ophthalmic fashion and a lid speculum was placed. A paracentesis was created with the side port blade and the anterior chamber was filled with viscoelastic. A near clear corneal incision was performed with the steel keratome. A continuous curvilinear capsulorrhexis was performed with a cystotome followed by the capsulorrhexis forceps. Hydrodissection and hydrodelineation were carried out with BSS on a blunt cannula. The lens was removed in a stop and chop  technique and the remaining cortical material was removed with the irrigation-aspiration handpiece. The capsular bag was inflated with viscoelastic and the Technis ZCB00 lens was placed in the capsular bag without complication. The remaining viscoelastic was removed from the eye with the irrigation-aspiration handpiece. The wounds were hydrated. The anterior chamber was flushed with BSS and the eye was inflated to physiologic pressure. 0.35ml Vigamox was placed in the anterior chamber. The wounds were found to be water tight. The eye was dressed with Combigan. The patient was given protective glasses to wear throughout the day and a shield with which  to sleep tonight. The patient was also given drops with which to begin a drop regimen today and will follow-up with me in one day. Implant Name Type Inv. Item Serial No. Manufacturer Lot No. LRB No. Used Action  LENS IOL DIOP 20.5 - [id number] Intraocular Lens LENS IOL DIOP 20.5 [id number] AMO  Left 1 Implanted    Procedure(s) with comments: CATARACT EXTRACTION PHACO AND INTRAOCULAR LENS PLACEMENT ([facility]) LEFT DIABETIC (Left) - diabetes - insulin and oral meds  Electronically signed: [name] 08/10/2018 11:18 AM

## 2018-08-11 ENCOUNTER — Encounter: Payer: Self-pay | Admitting: Ophthalmology

## 2018-08-20 ENCOUNTER — Other Ambulatory Visit
Admission: RE | Admit: 2018-08-20 | Discharge: 2018-08-20 | Disposition: A | Discharge status: Home / Self Care | Payer: Medicare Other | Source: Ambulatory Visit | Attending: Ophthalmology | Admitting: Ophthalmology

## 2018-08-20 ENCOUNTER — Other Ambulatory Visit: Payer: Self-pay

## 2018-08-20 DIAGNOSIS — Z1159 Encounter for screening for other viral diseases: Secondary | ICD-10-CM | POA: Insufficient documentation

## 2018-08-20 DIAGNOSIS — E1159 Type 2 diabetes mellitus with other circulatory complications: Secondary | ICD-10-CM | POA: Diagnosis not present

## 2018-08-20 DIAGNOSIS — H2511 Age-related nuclear cataract, right eye: Secondary | ICD-10-CM | POA: Diagnosis not present

## 2018-08-20 NOTE — Discharge Instructions (Signed)

## 2018-08-21 LAB — NOVEL CORONAVIRUS, NAA (HOSP ORDER, SEND-OUT TO REF LAB; TAT 18-24 HRS): SARS-CoV-2, NAA: NOT DETECTED

## 2018-08-22 ENCOUNTER — Other Ambulatory Visit: Payer: Self-pay | Admitting: Internal Medicine

## 2018-08-23 DIAGNOSIS — E113513 Type 2 diabetes mellitus with proliferative diabetic retinopathy with macular edema, bilateral: Secondary | ICD-10-CM | POA: Diagnosis not present

## 2018-08-24 ENCOUNTER — Encounter
Admission: RE | Disposition: A | Discharge status: Home / Self Care | Payer: Self-pay | Source: Home / Self Care | Attending: Ophthalmology

## 2018-08-24 ENCOUNTER — Ambulatory Visit: Payer: Medicare Other | Admitting: Anesthesiology

## 2018-08-24 ENCOUNTER — Ambulatory Visit
Admission: RE | Admit: 2018-08-24 | Discharge: 2018-08-24 | Disposition: A | Discharge status: Home / Self Care | Payer: Medicare Other | Attending: Ophthalmology | Admitting: Ophthalmology

## 2018-08-24 DIAGNOSIS — I130 Hypertensive heart and chronic kidney disease with heart failure and stage 1 through stage 4 chronic kidney disease, or unspecified chronic kidney disease: Secondary | ICD-10-CM | POA: Diagnosis not present

## 2018-08-24 DIAGNOSIS — E1136 Type 2 diabetes mellitus with diabetic cataract: Secondary | ICD-10-CM | POA: Diagnosis not present

## 2018-08-24 DIAGNOSIS — G4730 Sleep apnea, unspecified: Secondary | ICD-10-CM | POA: Insufficient documentation

## 2018-08-24 DIAGNOSIS — H2511 Age-related nuclear cataract, right eye: Secondary | ICD-10-CM | POA: Insufficient documentation

## 2018-08-24 DIAGNOSIS — E669 Obesity, unspecified: Secondary | ICD-10-CM | POA: Diagnosis not present

## 2018-08-24 DIAGNOSIS — Z7982 Long term (current) use of aspirin: Secondary | ICD-10-CM | POA: Insufficient documentation

## 2018-08-24 DIAGNOSIS — Z79899 Other long term (current) drug therapy: Secondary | ICD-10-CM | POA: Insufficient documentation

## 2018-08-24 DIAGNOSIS — I509 Heart failure, unspecified: Secondary | ICD-10-CM | POA: Insufficient documentation

## 2018-08-24 DIAGNOSIS — Z6837 Body mass index (BMI) 37.0-37.9, adult: Secondary | ICD-10-CM | POA: Diagnosis not present

## 2018-08-24 DIAGNOSIS — H25811 Combined forms of age-related cataract, right eye: Secondary | ICD-10-CM | POA: Diagnosis not present

## 2018-08-24 DIAGNOSIS — Z794 Long term (current) use of insulin: Secondary | ICD-10-CM | POA: Diagnosis not present

## 2018-08-24 DIAGNOSIS — E1122 Type 2 diabetes mellitus with diabetic chronic kidney disease: Secondary | ICD-10-CM | POA: Insufficient documentation

## 2018-08-24 DIAGNOSIS — E1151 Type 2 diabetes mellitus with diabetic peripheral angiopathy without gangrene: Secondary | ICD-10-CM | POA: Diagnosis not present

## 2018-08-24 DIAGNOSIS — N183 Chronic kidney disease, stage 3 (moderate): Secondary | ICD-10-CM | POA: Diagnosis not present

## 2018-08-24 DIAGNOSIS — E7800 Pure hypercholesterolemia, unspecified: Secondary | ICD-10-CM | POA: Diagnosis not present

## 2018-08-24 HISTORY — PX: CATARACT EXTRACTION W/PHACO: SHX586

## 2018-08-24 LAB — GLUCOSE, CAPILLARY
Glucose-Capillary: 56 mg/dL — ABNORMAL LOW (ref 70–99)
Glucose-Capillary: 56 mg/dL — ABNORMAL LOW (ref 70–99)
Glucose-Capillary: 97 mg/dL (ref 70–99)
Glucose-Capillary: 83 mg/dL (ref 70–99)

## 2018-08-24 SURGERY — PHACOEMULSIFICATION, CATARACT, WITH IOL INSERTION
Anesthesia: Monitor Anesthesia Care | Site: Eye | Laterality: Right

## 2018-08-24 MED ORDER — MIDAZOLAM HCL 2 MG/2ML IJ SOLN
INTRAMUSCULAR | Status: DC | PRN
  Administered 2018-08-24 (×2): 1 mg via INTRAVENOUS

## 2018-08-24 MED ORDER — LIDOCAINE HCL (PF) 2 % IJ SOLN
INTRAOCULAR | Status: DC | PRN
  Administered 2018-08-24: 1 mL

## 2018-08-24 MED ORDER — TETRACAINE HCL 0.5 % OP SOLN: 1.0000 [drp] | OPHTHALMIC | Status: DC | PRN

## 2018-08-24 MED ORDER — ARMC OPHTHALMIC DILATING DROPS
1.0000 "application " | OPHTHALMIC | Status: DC | PRN
  Administered 2018-08-24 (×3): 1 via OPHTHALMIC

## 2018-08-24 MED ORDER — NA CHONDROIT SULF-NA HYALURON 40-17 MG/ML IO SOLN
INTRAOCULAR | Status: DC | PRN
  Administered 2018-08-24: 1 mL via INTRAOCULAR

## 2018-08-24 MED ORDER — ONDANSETRON HCL 4 MG/2ML IJ SOLN: 4.0000 mg | Freq: Once | INTRAMUSCULAR | Status: DC | PRN

## 2018-08-24 MED ORDER — TETRACAINE HCL 0.5 % OP SOLN
1.0000 [drp] | OPHTHALMIC | Status: DC | PRN
  Administered 2018-08-24 (×3): 1 [drp] via OPHTHALMIC

## 2018-08-24 MED ORDER — MOXIFLOXACIN HCL 0.5 % OP SOLN
OPHTHALMIC | Status: DC | PRN
  Administered 2018-08-24: 0.2 mL via OPHTHALMIC

## 2018-08-24 MED ORDER — BRIMONIDINE TARTRATE-TIMOLOL 0.2-0.5 % OP SOLN
OPHTHALMIC | Status: DC | PRN
  Administered 2018-08-24: 1 [drp] via OPHTHALMIC

## 2018-08-24 MED ORDER — EPINEPHRINE PF 1 MG/ML IJ SOLN
INTRAOCULAR | Status: DC | PRN
  Administered 2018-08-24: 87 mL via OPHTHALMIC

## 2018-08-24 MED ORDER — LACTATED RINGERS IV SOLN: INTRAVENOUS | Status: DC

## 2018-08-24 MED ORDER — DEXTROSE 50 % IV SOLN
25.0000 mL | Freq: Once | INTRAVENOUS | Status: AC
  Administered 2018-08-24: 25 mL via INTRAVENOUS

## 2018-08-24 MED ORDER — FENTANYL CITRATE (PF) 100 MCG/2ML IJ SOLN
INTRAMUSCULAR | Status: DC | PRN
  Administered 2018-08-24: 50 ug via INTRAVENOUS

## 2018-08-24 SURGICAL SUPPLY — 21 items
CANNULA ANT/CHMB 27G (MISCELLANEOUS) ×1 IMPLANT
CANNULA ANT/CHMB 27GA (MISCELLANEOUS) ×3 IMPLANT
GLOVE SURG LX 8.0 MICRO (GLOVE) ×4
GLOVE SURG LX STRL 8.0 MICRO (GLOVE) ×1 IMPLANT
GLOVE SURG TRIUMPH 8.0 PF LTX (GLOVE) ×3 IMPLANT
GOWN STRL REUS W/ TWL LRG LVL3 (GOWN DISPOSABLE) ×2 IMPLANT
GOWN STRL REUS W/TWL LRG LVL3 (GOWN DISPOSABLE) ×4
LENS IOL TECNIS ITEC 20.5 (Intraocular Lens) ×2 IMPLANT
MARKER SKIN DUAL TIP RULER LAB (MISCELLANEOUS) ×3 IMPLANT
NDL FILTER BLUNT 18X1 1/2 (NEEDLE) ×1 IMPLANT
NDL RETROBULBAR .5 NSTRL (NEEDLE) ×3 IMPLANT
NEEDLE FILTER BLUNT 18X 1/2SAF (NEEDLE) ×2
NEEDLE FILTER BLUNT 18X1 1/2 (NEEDLE) ×1 IMPLANT
PACK EYE AFTER SURG (MISCELLANEOUS) ×3 IMPLANT
PACK OPTHALMIC (MISCELLANEOUS) ×3 IMPLANT
PACK PORFILIO (MISCELLANEOUS) ×3 IMPLANT
SUT ETHILON 10-0 CS-B-6CS-B-6 (SUTURE)
SUTURE EHLN 10-0 CS-B-6CS-B-6 (SUTURE) IMPLANT
SYR 3ML LL SCALE MARK (SYRINGE) ×3 IMPLANT
SYR TB 1ML LUER SLIP (SYRINGE) ×3 IMPLANT
WIPE NON LINTING 3.25X3.25 (MISCELLANEOUS) ×3 IMPLANT

## 2018-08-24 NOTE — H&P (Signed)
All labs reviewed. Abnormal studies sent to patients PCP when indicated.  Previous H&P reviewed, patient examined, there are NO CHANGES.  Adam Feliz Porfilio6/30/20207:26 AM

## 2018-08-24 NOTE — Anesthesia Procedure Notes (Signed)
Procedure Name: MAC Date/Time: 08/24/2018 7:32 AM Performed by: Cameron Ali, CRNA Pre-anesthesia Checklist: Patient identified, Emergency Drugs available, Suction available, Timeout performed and Patient being monitored Patient Re-evaluated:Patient Re-evaluated prior to induction Oxygen Delivery Method: Nasal cannula Placement Confirmation: positive ETCO2

## 2018-08-24 NOTE — Anesthesia Preprocedure Evaluation (Signed)
Anesthesia Evaluation  Patient identified by MRN, date of birth, ID band Patient awake    Reviewed: Allergy & Precautions, NPO status , Patient's Chart, lab work & pertinent test results  Airway Mallampati: II  TM Distance: >3 FB Neck ROM: Full    Dental  (+)    Pulmonary former smoker,    breath sounds clear to auscultation       Cardiovascular hypertension, + Peripheral Vascular Disease   Rhythm:Regular Rate:Normal     Neuro/Psych    GI/Hepatic GERD  ,  Endo/Other  diabetes, Type 2, Insulin DependentObesity   Renal/GU Renal disease (stage III CKD)     Musculoskeletal   Abdominal   Peds  Hematology   Anesthesia Other Findings   Reproductive/Obstetrics                             Anesthesia Physical  Anesthesia Plan  ASA: III  Anesthesia Plan: MAC   Post-op Pain Management:    Induction: Intravenous  PONV Risk Score and Plan: 1  Airway Management Planned: Nasal Cannula  Additional Equipment:   Intra-op Plan:   Post-operative Plan:   Informed Consent: I have reviewed the patients History and Physical, chart, labs and discussed the procedure including the risks, benefits and alternatives for the proposed anesthesia with the patient or authorized representative who has indicated his/her understanding and acceptance.       Plan Discussed with: CRNA  Anesthesia Plan Comments:         Anesthesia Quick Evaluation

## 2018-08-24 NOTE — Op Note (Signed)
PREOPERATIVE DIAGNOSIS:  Nuclear sclerotic cataract of the right eye.   POSTOPERATIVE DIAGNOSIS:  CATARACT   OPERATIVE PROCEDURE: Procedure(s): CATARACT EXTRACTION PHACO AND INTRAOCULAR LENS PLACEMENT ([place])  RIGHT DIABETIC   SURGEON:  [name], MD.   ANESTHESIA:  Anesthesiologist: Veda Canning, MD CRNA: Cameron Ali, CRNA  1.      Managed anesthesia care. 2.      0.33ml of Shugarcaine was instilled in the eye following the paracentesis.   COMPLICATIONS:  None.   TECHNIQUE:   Stop and chop   DESCRIPTION OF PROCEDURE:  The patient was examined and consented in the preoperative holding area where the aforementioned topical anesthesia was applied to the right eye and then brought back to the Operating Room where the right eye was prepped and draped in the usual sterile ophthalmic fashion and a lid speculum was placed. A paracentesis was created with the side port blade and the anterior chamber was filled with viscoelastic. A near clear corneal incision was performed with the steel keratome. A continuous curvilinear capsulorrhexis was performed with a cystotome followed by the capsulorrhexis forceps. Hydrodissection and hydrodelineation were carried out with BSS on a blunt cannula. The lens was removed in a stop and chop  technique and the remaining cortical material was removed with the irrigation-aspiration handpiece. The capsular bag was inflated with viscoelastic and the Technis ZCB00  lens was placed in the capsular bag without complication. The remaining viscoelastic was removed from the eye with the irrigation-aspiration handpiece. The wounds were hydrated. The anterior chamber was flushed with BSS and the eye was inflated to physiologic pressure. 0.55ml of Vigamox was placed in the anterior chamber. The wounds were found to be water tight. The eye was dressed with Combigan. The patient was given protective glasses to wear throughout the day and a shield with which to sleep tonight. The  patient was also given drops with which to begin a drop regimen today and will follow-up with me in one day. Implant Name Type Inv. Item Serial No. Manufacturer Lot No. LRB No. Used Action  LENS IOL DIOP 20.5 - [id number] Intraocular Lens LENS IOL DIOP 20.5 [id number] AMO  Right 1 Implanted   Procedure(s) with comments: CATARACT EXTRACTION PHACO AND INTRAOCULAR LENS PLACEMENT ([facility])  RIGHT DIABETIC (Right) - DIABETIC  Electronically signed: [name] 08/24/2018 7:56 AM

## 2018-08-24 NOTE — Transfer of Care (Signed)
Immediate Anesthesia Transfer of Care Note  Patient: Adam [name]  Procedure(s) Performed: CATARACT EXTRACTION PHACO AND INTRAOCULAR LENS PLACEMENT ([facility])  RIGHT DIABETIC (Right Eye)  Patient Location: PACU  Anesthesia Type: MAC  Level of Consciousness: awake, alert  and patient cooperative  Airway and Oxygen Therapy: Patient Spontanous Breathing and Patient connected to supplemental oxygen  Post-op Assessment: Post-op Vital signs reviewed, Patient's Cardiovascular Status Stable, Respiratory Function Stable, Patent Airway and No signs of Nausea or vomiting  Post-op Vital Signs: Reviewed and stable  Complications: No apparent anesthesia complications

## 2018-08-24 NOTE — Anesthesia Postprocedure Evaluation (Signed)
Anesthesia Post Note  Patient: Adam [name]  Procedure(s) Performed: CATARACT EXTRACTION PHACO AND INTRAOCULAR LENS PLACEMENT ([facility])  RIGHT DIABETIC (Right Eye)  Patient location during evaluation: PACU Anesthesia Type: MAC Level of consciousness: awake and alert Pain management: pain level controlled Vital Signs Assessment: post-procedure vital signs reviewed and stable Respiratory status: spontaneous breathing, nonlabored ventilation, respiratory function stable and patient connected to nasal cannula oxygen Cardiovascular status: stable and blood pressure returned to baseline Postop Assessment: no apparent nausea or vomiting Anesthetic complications: no    Veda Canning

## 2018-09-28 DIAGNOSIS — E113513 Type 2 diabetes mellitus with proliferative diabetic retinopathy with macular edema, bilateral: Secondary | ICD-10-CM | POA: Diagnosis not present

## 2018-09-29 ENCOUNTER — Other Ambulatory Visit: Payer: Self-pay | Admitting: Internal Medicine

## 2018-10-05 DIAGNOSIS — E113513 Type 2 diabetes mellitus with proliferative diabetic retinopathy with macular edema, bilateral: Secondary | ICD-10-CM | POA: Diagnosis not present

## 2018-10-13 ENCOUNTER — Other Ambulatory Visit: Payer: Self-pay

## 2018-10-13 NOTE — Patient Outreach (Signed)
[facility]) Care Management  10/13/2018  NEERAJ HOUSAND 18-Aug-1950 [id number]   Medication Adherence call to Mr. Arliss Frisina HIPPA Compliant Voice message left with a call back number. Mr. Renner is showing past due on Lisinopril/Hctz 20/12.5 mg under [place].  [place] Management Direct Dial [phone]  Fax [phone] [email or web address]

## 2018-10-16 ENCOUNTER — Other Ambulatory Visit: Payer: Self-pay | Admitting: Internal Medicine

## 2018-10-26 ENCOUNTER — Other Ambulatory Visit: Payer: Self-pay

## 2018-10-26 NOTE — Patient Outreach (Signed)
[facility]) Care Management  10/26/2018  Adam [name] [birth year] [id number]   Medication Adherence call to Mr. Adam [name] HIPPA Compliant Voice message left with a call back number. [name] is showing past due on Lisinopril/Hctz 20/25 mg and Atorvastatin 80 mg under [place].   [place] Management Direct Dial [phone]  Fax [phone] [email or web address]

## 2018-12-08 DIAGNOSIS — E113513 Type 2 diabetes mellitus with proliferative diabetic retinopathy with macular edema, bilateral: Secondary | ICD-10-CM | POA: Diagnosis not present

## 2018-12-08 DIAGNOSIS — H43393 Other vitreous opacities, bilateral: Secondary | ICD-10-CM | POA: Diagnosis not present

## 2018-12-08 DIAGNOSIS — Z961 Presence of intraocular lens: Secondary | ICD-10-CM | POA: Diagnosis not present

## 2018-12-08 DIAGNOSIS — H4321 Crystalline deposits in vitreous body, right eye: Secondary | ICD-10-CM | POA: Diagnosis not present

## 2018-12-08 DIAGNOSIS — E113593 Type 2 diabetes mellitus with proliferative diabetic retinopathy without macular edema, bilateral: Secondary | ICD-10-CM | POA: Diagnosis not present

## 2018-12-08 DIAGNOSIS — H3322 Serous retinal detachment, left eye: Secondary | ICD-10-CM | POA: Diagnosis not present

## 2018-12-09 ENCOUNTER — Telehealth: Payer: Self-pay | Admitting: Internal Medicine

## 2018-12-09 MED ORDER — HUMALOG MIX 75/25 (75-25) 100 UNIT/ML ~~LOC~~ SUSP: SUBCUTANEOUS | 3 refills | Status: DC

## 2018-12-09 NOTE — Telephone Encounter (Signed)
Rx sent electronically.  

## 2018-12-09 NOTE — Telephone Encounter (Signed)
Patient's wife stated that they are needing a new script sent in to the pharmacy for the patient's Humalog 75/25.   Adam [name]

## 2018-12-10 ENCOUNTER — Other Ambulatory Visit: Payer: Self-pay

## 2018-12-10 NOTE — Patient Outreach (Signed)
[facility]) Care Management  12/10/2018  Adam [name] Jul 19, 1950 [id number]  Medication Adherence call to Mr. Adam [name] HIPPA Compliant Voice message left with a call back number. [name] is showing past due on Atorvastatin 80 mg under [place].   [place] Management Direct Dial [phone]  Fax [phone] [email or web address]

## 2018-12-30 ENCOUNTER — Ambulatory Visit (INDEPENDENT_AMBULATORY_CARE_PROVIDER_SITE_OTHER): Payer: Medicare Other

## 2018-12-30 DIAGNOSIS — Z23 Encounter for immunization: Secondary | ICD-10-CM

## 2019-01-04 ENCOUNTER — Other Ambulatory Visit: Payer: Self-pay

## 2019-01-04 DIAGNOSIS — Z20822 Contact with and (suspected) exposure to covid-19: Secondary | ICD-10-CM

## 2019-01-04 DIAGNOSIS — E113513 Type 2 diabetes mellitus with proliferative diabetic retinopathy with macular edema, bilateral: Secondary | ICD-10-CM | POA: Diagnosis not present

## 2019-01-06 LAB — NOVEL CORONAVIRUS, NAA: SARS-CoV-2, NAA: NOT DETECTED

## 2019-01-07 ENCOUNTER — Telehealth: Payer: Self-pay | Admitting: General Practice

## 2019-01-07 NOTE — Telephone Encounter (Signed)
Negative COVID results given. Patient results "NOT Detected." Caller expressed understanding. ° °

## 2019-01-26 ENCOUNTER — Encounter: Payer: Self-pay | Admitting: Internal Medicine

## 2019-01-26 ENCOUNTER — Ambulatory Visit (INDEPENDENT_AMBULATORY_CARE_PROVIDER_SITE_OTHER): Payer: Medicare Other | Admitting: Internal Medicine

## 2019-01-26 ENCOUNTER — Other Ambulatory Visit: Payer: Self-pay

## 2019-01-26 VITALS — BP 120/78 | HR 77 | Temp 98.0°F | Ht 72.0 in | Wt 267.0 lb

## 2019-01-26 DIAGNOSIS — I10 Essential (primary) hypertension: Secondary | ICD-10-CM

## 2019-01-26 DIAGNOSIS — M1711 Unilateral primary osteoarthritis, right knee: Secondary | ICD-10-CM | POA: Diagnosis not present

## 2019-01-26 DIAGNOSIS — I5032 Chronic diastolic (congestive) heart failure: Secondary | ICD-10-CM

## 2019-01-26 DIAGNOSIS — IMO0002 Reserved for concepts with insufficient information to code with codable children: Secondary | ICD-10-CM

## 2019-01-26 DIAGNOSIS — E1165 Type 2 diabetes mellitus with hyperglycemia: Secondary | ICD-10-CM | POA: Diagnosis not present

## 2019-01-26 DIAGNOSIS — E1149 Type 2 diabetes mellitus with other diabetic neurological complication: Secondary | ICD-10-CM

## 2019-01-26 LAB — POCT GLYCOSYLATED HEMOGLOBIN (HGB A1C): Hemoglobin A1C: 7.9 % — AB (ref 4.0–5.6)

## 2019-01-26 LAB — HM DIABETES FOOT EXAM

## 2019-01-26 MED ORDER — NOVOLIN 70/30 RELION (70-30) 100 UNIT/ML ~~LOC~~ SUSP: 50.0000 [IU] | Freq: Two times a day (BID) | SUBCUTANEOUS | 0 refills | Status: DC

## 2019-01-26 MED ORDER — ONETOUCH VERIO VI STRP: 1.0000 | ORAL_STRIP | Freq: Two times a day (BID) | 4 refills | Status: DC

## 2019-01-26 MED ORDER — METFORMIN HCL 1000 MG PO TABS: 1000.0000 mg | ORAL_TABLET | Freq: Two times a day (BID) | ORAL | 3 refills | Status: DC

## 2019-01-26 NOTE — Assessment & Plan Note (Signed)
Lab Results  Component Value Date   HGBA1C 7.9 (A) 01/26/2019   Best control ever On lower insulin now due to hypoglycemia Now with apparent retinopathy as well

## 2019-01-26 NOTE — Assessment & Plan Note (Signed)
Compensated Weight is down and no edema

## 2019-01-26 NOTE — Assessment & Plan Note (Signed)
BP Readings from Last 3 Encounters:  01/26/19 120/78  08/24/18 (!) 156/90  08/10/18 (!) 135/93   Controlled with regimen
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
## 2019-01-26 NOTE — Assessment & Plan Note (Signed)
No effusion Tylenol and icy hot are helpful---urged him to avoid NSAIDs

## 2019-02-08 DIAGNOSIS — E113513 Type 2 diabetes mellitus with proliferative diabetic retinopathy with macular edema, bilateral: Secondary | ICD-10-CM | POA: Diagnosis not present

## 2019-03-22 LAB — HM DIABETES EYE EXAM

## 2019-03-24 ENCOUNTER — Encounter: Payer: Self-pay | Admitting: Ophthalmology

## 2019-04-04 ENCOUNTER — Telehealth: Payer: Self-pay

## 2019-04-04 MED ORDER — "INSULIN SYRINGE/NEEDLE 28G X 1/2"" 1 ML MISC": 4 refills | Status: DC

## 2019-04-04 MED ORDER — ONETOUCH VERIO VI STRP: 1.0000 | ORAL_STRIP | Freq: Two times a day (BID) | 4 refills | Status: DC

## 2019-04-04 MED ORDER — FUROSEMIDE 80 MG PO TABS: 80.0000 mg | ORAL_TABLET | Freq: Every day | ORAL | 3 refills | Status: DC

## 2019-04-04 MED ORDER — LISINOPRIL-HYDROCHLOROTHIAZIDE 20-25 MG PO TABS: 1.0000 | ORAL_TABLET | Freq: Every day | ORAL | 3 refills | Status: DC

## 2019-04-04 MED ORDER — HUMALOG MIX 75/25 (75-25) 100 UNIT/ML ~~LOC~~ SUSP: 100.0000 [IU] | Freq: Two times a day (BID) | SUBCUTANEOUS | 3 refills | Status: DC

## 2019-04-04 MED ORDER — ATORVASTATIN CALCIUM 80 MG PO TABS: 80.0000 mg | ORAL_TABLET | Freq: Every day | ORAL | 3 refills | Status: DC

## 2019-04-04 MED ORDER — METFORMIN HCL 1000 MG PO TABS: 1000.0000 mg | ORAL_TABLET | Freq: Two times a day (BID) | ORAL | 3 refills | Status: DC

## 2019-04-04 MED ORDER — SITAGLIPTIN PHOSPHATE 100 MG PO TABS: 100.0000 mg | ORAL_TABLET | Freq: Every day | ORAL | 3 refills | Status: DC

## 2019-04-04 NOTE — Telephone Encounter (Signed)
That is fine to change back to the humalog 75/25 at 100 units bid --for 1 year Fill all other Rx for 1 year as well

## 2019-04-04 NOTE — Telephone Encounter (Signed)
Pt is using [employer] now. Needs refills on atorvastatin, furosemide, Test Strips, Januvia, Needles, lisinopril-hctz, and metformin.  His new insurance covers Humalog. He wants to go back on Humalog 75/25 100 units twice a day. Will get approval from Dr Silvio Pate on that and send all the meds together.

## 2019-04-04 NOTE — Telephone Encounter (Signed)
Rxs all sent to [facility].

## 2019-04-18 ENCOUNTER — Telehealth: Payer: Self-pay

## 2019-04-18 DIAGNOSIS — E1149 Type 2 diabetes mellitus with other diabetic neurological complication: Secondary | ICD-10-CM

## 2019-04-18 DIAGNOSIS — IMO0002 Reserved for concepts with insufficient information to code with codable children: Secondary | ICD-10-CM

## 2019-04-18 MED ORDER — ONETOUCH ULTRASOFT LANCETS MISC: 12 refills | Status: DC

## 2019-04-18 MED ORDER — ALCOHOL SWABS PADS: MEDICATED_PAD | 6 refills | Status: DC

## 2019-04-18 MED ORDER — BLOOD GLUCOSE MONITOR KIT: PACK | 0 refills | Status: DC

## 2019-04-18 NOTE — Telephone Encounter (Signed)
Scripts sent for swabs, lancets and glucometer.    [place] Night - Client >>>Contains Verbal Order - Signature Required<<< TELEPHONE ADVICE RECORD AccessNurse Patient Name: Adam [name] Gender: Male DOB: 05-11-1950 Age: 69 Y 3 M 3 D Return Phone Number: Address: City/State/Zip: [place] Client [place] Night - Client Client Site [place] Physician Viviana Simpler - MD Contact Type Call Who Is Calling Pharmacy Call Type Pharmacy Send to RN Chief Complaint Paging or Request for Consult Reason for Call Request to speak to Physician Initial Comment Caller states that she is calling from the pharmacy. Diabetic testing supplies. Additional Comment Pharmacy Name [facility] Pharmacist Name [place] Number [id number] Translation No Nurse Assessment Nurse: Antionette Fairy, RN, Romelle Starcher Date/Time Eilene Ghazi Time): 04/15/2019 8:11:40 PM Please select the assessment type ---Pharmacy clarification Additional Documentation ---Pharmacy states they have the order for test strips, but patient also needs Meter, lancets, alcohol pads, and control solution. Is there an on-call physician for the client? ---Yes Do the client directives allow paging the on call for medication concerns? ---Yes Document information that requires clarification. ---Authorized filling supplies based on client profile Guidelines Guideline Title Affirmed Question Affirmed Notes Nurse Date/Time ([place] Time) Disp. Time Eilene Ghazi Time) Disposition Final User 04/15/2019 8:11:23 PM Clinical Call Yes Antionette Fairy, RN, [place] Verbal Orders/Maintenance Medications Medication Refill Route Dosage Regime Duration Admin Instructions User Name 1 glucose meter, Lancets, Alcohol Pads, Control solution Per Package Instructions BID 90 Days Gave authorization for 90 day supply with 3 refills on lancets and alcohol pads 1 meter and Antionette Fairy,  RN, Romelle Starcher PLEASE NOTE: All timestamps contained within this report are represented as [place] Standard Time. CONFIDENTIALTY NOTICE: This fax transmission is intended only for the addressee. It contains information that is legally privileged, confidential or otherwise protected from use or disclosure. If you are not the intended recipient, you are strictly prohibited from reviewing, disclosing, copying using or disseminating any of this information or taking any action in reliance on or regarding this information. If you have received this fax in error, please notify us immediately by telephone so that we can arrange for its return to [place]. Phone: [phone], Toll-Free: [phone], Fax: [phone] Page: 2 of 3 Call Id: [id number] Verbal Orders/Maintenance Medications Medication Refill Route Dosage Regime Duration Admin Instructions User Name 1 control solution, use as directed

## 2019-05-16 ENCOUNTER — Encounter: Payer: Self-pay | Admitting: Internal Medicine

## 2019-05-16 ENCOUNTER — Ambulatory Visit (INDEPENDENT_AMBULATORY_CARE_PROVIDER_SITE_OTHER): Payer: Medicare PPO | Admitting: Internal Medicine

## 2019-05-16 ENCOUNTER — Ambulatory Visit: Payer: BC Managed Care – PPO | Admitting: Internal Medicine

## 2019-05-16 ENCOUNTER — Other Ambulatory Visit: Payer: Self-pay

## 2019-05-16 DIAGNOSIS — L309 Dermatitis, unspecified: Secondary | ICD-10-CM

## 2019-05-16 MED ORDER — TRIAMCINOLONE ACETONIDE 0.1 % EX CREA: 1.0000 "application " | TOPICAL_CREAM | Freq: Two times a day (BID) | CUTANEOUS | 1 refills | Status: DC | PRN

## 2019-05-16 NOTE — Assessment & Plan Note (Signed)
Looks eczematous--not like stasis Reassured-no infection Will try TAC--small quantity Discussed elevation and support socks also
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
## 2019-07-04 ENCOUNTER — Ambulatory Visit (INDEPENDENT_AMBULATORY_CARE_PROVIDER_SITE_OTHER): Payer: Medicare PPO | Admitting: Internal Medicine

## 2019-07-04 ENCOUNTER — Other Ambulatory Visit: Payer: Self-pay

## 2019-07-04 ENCOUNTER — Encounter: Payer: Self-pay | Admitting: Internal Medicine

## 2019-07-04 DIAGNOSIS — N5089 Other specified disorders of the male genital organs: Secondary | ICD-10-CM | POA: Diagnosis not present
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
## 2019-07-04 NOTE — Assessment & Plan Note (Addendum)
Markedly worsened quickly No other fluid overload to suggest a systemic process--weights have been stable, no sig peripheral edema and echo showed normal EF Not related to his past scrotal mass--that had been evaluated by urology in the past Tender but not clearly infected Doesn't transilluminate--but still could be hydrocele Will set up urgently with urology
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
## 2019-07-14 ENCOUNTER — Ambulatory Visit (INDEPENDENT_AMBULATORY_CARE_PROVIDER_SITE_OTHER): Payer: Medicare PPO | Admitting: Urology

## 2019-07-14 ENCOUNTER — Other Ambulatory Visit: Payer: Self-pay | Admitting: Radiology

## 2019-07-14 ENCOUNTER — Encounter: Payer: Self-pay | Admitting: Urology

## 2019-07-14 ENCOUNTER — Other Ambulatory Visit: Payer: Self-pay

## 2019-07-14 VITALS — BP 118/74 | HR 84 | Ht 72.0 in | Wt 264.0 lb

## 2019-07-14 DIAGNOSIS — N430 Encysted hydrocele: Secondary | ICD-10-CM | POA: Diagnosis not present

## 2019-07-14 DIAGNOSIS — N433 Hydrocele, unspecified: Secondary | ICD-10-CM

## 2019-07-15 ENCOUNTER — Ambulatory Visit: Payer: Self-pay | Admitting: Urology

## 2019-07-17 ENCOUNTER — Encounter: Payer: Self-pay | Admitting: Urology

## 2019-07-18 ENCOUNTER — Other Ambulatory Visit: Payer: Self-pay | Admitting: Radiology

## 2019-07-26 ENCOUNTER — Other Ambulatory Visit: Payer: Self-pay

## 2019-07-26 ENCOUNTER — Ambulatory Visit
Admission: RE | Admit: 2019-07-26 | Discharge: 2019-07-26 | Disposition: A | Discharge status: Home / Self Care | Payer: Medicare PPO | Source: Ambulatory Visit | Attending: Urology | Admitting: Urology

## 2019-07-26 DIAGNOSIS — N433 Hydrocele, unspecified: Secondary | ICD-10-CM | POA: Diagnosis not present

## 2019-07-26 DIAGNOSIS — N430 Encysted hydrocele: Secondary | ICD-10-CM | POA: Insufficient documentation

## 2019-07-27 ENCOUNTER — Other Ambulatory Visit: Payer: Self-pay

## 2019-07-27 ENCOUNTER — Encounter
Admission: RE | Admit: 2019-07-27 | Discharge: 2019-07-27 | Disposition: A | Discharge status: Home / Self Care | Payer: Medicare PPO | Source: Ambulatory Visit | Attending: Urology | Admitting: Urology

## 2019-07-27 HISTORY — DX: Unspecified osteoarthritis, unspecified site: M19.90

## 2019-07-27 HISTORY — DX: Sleep apnea, unspecified: G47.30

## 2019-07-27 NOTE — Patient Instructions (Signed)
Your procedure is scheduled on: 08-02-19 TUESDAY Report to Same Day Surgery 2nd floor medical mall [facility] Entrance-take elevator on left to 2nd floor.  Check in with surgery information desk.) To find out your arrival time please call [phone] between 1PM - 3PM on 08-01-19 MONDAY  Remember: Instructions that are not followed completely may result in serious medical risk, up to and including death, or upon the discretion of your surgeon and anesthesiologist your surgery may need to be rescheduled.    _x___ 1. Do not eat food after midnight the night before your procedure. NO GUM OR CANDY AFTER MIDNIGHT. You may drink WATER up to 2 hours before you are scheduled to arrive at the hospital for your procedure.  Do not drink WATER within 2 hours of your scheduled arrival to the hospital.  Type 1 and type 2 diabetics should only drink water.   ____Ensure clear carbohydrate drink on the way to the hospital for bariatric patients  ____Ensure clear carbohydrate drink 3 hours before surgery.    __x__ 2. No Alcohol for 24 hours before or after surgery.   __x__3. No Smoking or e-cigarettes for 24 prior to surgery.  Do not use any chewable tobacco products for at least 6 hour prior to surgery   ____  4. Bring all medications with you on the day of surgery if instructed.    __x__ 5. Notify your doctor if there is any change in your medical condition     (cold, fever, infections).    x___6. On the morning of surgery brush your teeth with toothpaste and water.  You may rinse your mouth with mouth wash if you wish.  Do not swallow any toothpaste or mouthwash.   Do not wear jewelry, make-up, hairpins, clips or nail polish.  Do not wear lotions, powders, or perfumes. You may wear deodorant.  Do not shave 48 hours prior to surgery. Men may shave face and neck.  Do not bring valuables to the hospital.    [facility] is not responsible for any belongings or valuables.               Contacts,  dentures or bridgework may not be worn into surgery.  Leave your suitcase in the car. After surgery it may be brought to your room.  For patients admitted to the hospital, discharge time is determined by your                       treatment team.  _  Patients discharged the day of surgery will not be allowed to drive home.  You will need someone to drive you home and stay with you the night of your procedure.    Please read over the following fact sheets that you were given:   [facility] Preparing for Surgery  ____ TAKE THE FOLLOWING MEDICATION THE MORNING OF SURGERY WITH A SMALL SIP OF WATER. These include:  1. NONE  2.  3.  4.  5.  6.  ____Fleets enema or Magnesium Citrate as directed.   ____ Use CHG Soap or sage wipes as directed on instruction sheet   ____ Use inhalers on the day of surgery and bring to hospital day of surgery  _X___ Stop Metformin  2 days prior to surgery-LAST DOSE ON Saturday June 5TH    _X___ Take 1/2 of usual insulin dose the night before surgery and none on the morning surgery-NO INSULIN THE MORNING OF YOUR SURGERY  _x___ Follow recommendations from Cardiologist, Pulmonologist or PCP regarding stopping Aspirin, Coumadin, Plavix ,Eliquis, Effient, or Pradaxa, and Pletal-LAST DOSE OF ASPIRIN WAS TODAY AS INSTRUCTED BY DR STOIOFF'S OFFICE  X____Stop Anti-inflammatories such as Advil, Aleve, Ibuprofen, Motrin, Naproxen, Naprosyn, Goodies powders or aspirin products NOW-OK to take Tylenol    ____ Stop supplements until after surgery.   ____ Bring C-Pap to the hospital.

## 2019-07-28 ENCOUNTER — Encounter
Admission: RE | Admit: 2019-07-28 | Discharge: 2019-07-28 | Disposition: A | Discharge status: Home / Self Care | Payer: Medicare PPO | Source: Ambulatory Visit | Attending: Urology | Admitting: Urology

## 2019-07-28 DIAGNOSIS — R32 Unspecified urinary incontinence: Secondary | ICD-10-CM | POA: Diagnosis not present

## 2019-07-28 DIAGNOSIS — E11620 Type 2 diabetes mellitus with diabetic dermatitis: Secondary | ICD-10-CM | POA: Diagnosis not present

## 2019-07-28 DIAGNOSIS — Z794 Long term (current) use of insulin: Secondary | ICD-10-CM | POA: Diagnosis not present

## 2019-07-28 DIAGNOSIS — I951 Orthostatic hypotension: Secondary | ICD-10-CM | POA: Diagnosis not present

## 2019-07-28 DIAGNOSIS — Z01818 Encounter for other preprocedural examination: Secondary | ICD-10-CM | POA: Diagnosis not present

## 2019-07-28 DIAGNOSIS — R609 Edema, unspecified: Secondary | ICD-10-CM | POA: Diagnosis not present

## 2019-07-28 DIAGNOSIS — Z87891 Personal history of nicotine dependence: Secondary | ICD-10-CM | POA: Diagnosis not present

## 2019-07-28 DIAGNOSIS — I10 Essential (primary) hypertension: Secondary | ICD-10-CM | POA: Diagnosis not present

## 2019-07-28 DIAGNOSIS — K08109 Complete loss of teeth, unspecified cause, unspecified class: Secondary | ICD-10-CM | POA: Diagnosis not present

## 2019-07-28 DIAGNOSIS — E785 Hyperlipidemia, unspecified: Secondary | ICD-10-CM | POA: Diagnosis not present

## 2019-07-28 LAB — BASIC METABOLIC PANEL
Anion gap: 8 (ref 5–15)
BUN: 20 mg/dL (ref 8–23)
CO2: 22 mmol/L (ref 22–32)
Calcium: 9.1 mg/dL (ref 8.9–10.3)
Chloride: 109 mmol/L (ref 98–111)
Creatinine, Ser: 1.48 mg/dL — ABNORMAL HIGH (ref 0.61–1.24)
GFR calc Af Amer: 55 mL/min — ABNORMAL LOW (ref >60)
GFR calc non Af Amer: 47 mL/min — ABNORMAL LOW (ref >60)
Glucose, Bld: 150 mg/dL — ABNORMAL HIGH (ref 70–99)
Potassium: 4.2 mmol/L (ref 3.5–5.1)
Sodium: 139 mmol/L (ref 135–145)

## 2019-07-29 ENCOUNTER — Other Ambulatory Visit
Admission: RE | Admit: 2019-07-29 | Discharge: 2019-07-29 | Disposition: A | Discharge status: Home / Self Care | Payer: Medicare PPO | Source: Ambulatory Visit | Attending: Urology | Admitting: Urology

## 2019-07-29 ENCOUNTER — Other Ambulatory Visit: Payer: Self-pay

## 2019-07-29 DIAGNOSIS — Z01812 Encounter for preprocedural laboratory examination: Secondary | ICD-10-CM | POA: Diagnosis not present

## 2019-07-29 DIAGNOSIS — Z20822 Contact with and (suspected) exposure to covid-19: Secondary | ICD-10-CM | POA: Insufficient documentation

## 2019-07-30 LAB — SARS CORONAVIRUS 2 (TAT 6-24 HRS): SARS Coronavirus 2: NEGATIVE

## 2019-08-01 MED ORDER — CIPROFLOXACIN IN D5W 400 MG/200ML IV SOLN
400.0000 mg | INTRAVENOUS | Status: AC
  Administered 2019-08-02: 400 mg via INTRAVENOUS

## 2019-08-02 ENCOUNTER — Ambulatory Visit: Payer: Medicare PPO | Admitting: Anesthesiology

## 2019-08-02 ENCOUNTER — Encounter
Admission: RE | Disposition: A | Discharge status: Home / Self Care | Payer: Self-pay | Source: Home / Self Care | Attending: Urology

## 2019-08-02 ENCOUNTER — Ambulatory Visit
Admission: RE | Admit: 2019-08-02 | Discharge: 2019-08-02 | Disposition: A | Discharge status: Home / Self Care | Payer: Medicare PPO | Attending: Urology | Admitting: Urology

## 2019-08-02 ENCOUNTER — Encounter: Payer: Self-pay | Admitting: Urology

## 2019-08-02 ENCOUNTER — Other Ambulatory Visit: Payer: Self-pay

## 2019-08-02 DIAGNOSIS — L918 Other hypertrophic disorders of the skin: Secondary | ICD-10-CM | POA: Diagnosis not present

## 2019-08-02 DIAGNOSIS — E785 Hyperlipidemia, unspecified: Secondary | ICD-10-CM | POA: Diagnosis not present

## 2019-08-02 DIAGNOSIS — Z961 Presence of intraocular lens: Secondary | ICD-10-CM | POA: Diagnosis not present

## 2019-08-02 DIAGNOSIS — G4730 Sleep apnea, unspecified: Secondary | ICD-10-CM | POA: Insufficient documentation

## 2019-08-02 DIAGNOSIS — Z87891 Personal history of nicotine dependence: Secondary | ICD-10-CM | POA: Diagnosis not present

## 2019-08-02 DIAGNOSIS — K5730 Diverticulosis of large intestine without perforation or abscess without bleeding: Secondary | ICD-10-CM | POA: Diagnosis not present

## 2019-08-02 DIAGNOSIS — N183 Chronic kidney disease, stage 3 unspecified: Secondary | ICD-10-CM | POA: Diagnosis not present

## 2019-08-02 DIAGNOSIS — Z794 Long term (current) use of insulin: Secondary | ICD-10-CM | POA: Insufficient documentation

## 2019-08-02 DIAGNOSIS — K219 Gastro-esophageal reflux disease without esophagitis: Secondary | ICD-10-CM | POA: Insufficient documentation

## 2019-08-02 DIAGNOSIS — I872 Venous insufficiency (chronic) (peripheral): Secondary | ICD-10-CM | POA: Diagnosis not present

## 2019-08-02 DIAGNOSIS — N433 Hydrocele, unspecified: Secondary | ICD-10-CM | POA: Diagnosis not present

## 2019-08-02 DIAGNOSIS — Z86010 Personal history of colonic polyps: Secondary | ICD-10-CM | POA: Insufficient documentation

## 2019-08-02 DIAGNOSIS — E119 Type 2 diabetes mellitus without complications: Secondary | ICD-10-CM | POA: Diagnosis not present

## 2019-08-02 DIAGNOSIS — I5032 Chronic diastolic (congestive) heart failure: Secondary | ICD-10-CM | POA: Diagnosis not present

## 2019-08-02 DIAGNOSIS — Z9842 Cataract extraction status, left eye: Secondary | ICD-10-CM | POA: Insufficient documentation

## 2019-08-02 DIAGNOSIS — M1990 Unspecified osteoarthritis, unspecified site: Secondary | ICD-10-CM | POA: Diagnosis not present

## 2019-08-02 DIAGNOSIS — Z9841 Cataract extraction status, right eye: Secondary | ICD-10-CM | POA: Insufficient documentation

## 2019-08-02 DIAGNOSIS — Z888 Allergy status to other drugs, medicaments and biological substances status: Secondary | ICD-10-CM | POA: Diagnosis not present

## 2019-08-02 DIAGNOSIS — N430 Encysted hydrocele: Secondary | ICD-10-CM | POA: Diagnosis not present

## 2019-08-02 DIAGNOSIS — I10 Essential (primary) hypertension: Secondary | ICD-10-CM | POA: Insufficient documentation

## 2019-08-02 DIAGNOSIS — E1122 Type 2 diabetes mellitus with diabetic chronic kidney disease: Secondary | ICD-10-CM | POA: Diagnosis not present

## 2019-08-02 DIAGNOSIS — I130 Hypertensive heart and chronic kidney disease with heart failure and stage 1 through stage 4 chronic kidney disease, or unspecified chronic kidney disease: Secondary | ICD-10-CM | POA: Diagnosis not present

## 2019-08-02 HISTORY — PX: HYDROCELE EXCISION: SHX482

## 2019-08-02 HISTORY — PX: EXCISION OF SKIN TAG: SHX6270

## 2019-08-02 LAB — GLUCOSE, CAPILLARY
Glucose-Capillary: 283 mg/dL — ABNORMAL HIGH (ref 70–99)
Glucose-Capillary: 238 mg/dL — ABNORMAL HIGH (ref 70–99)

## 2019-08-02 SURGERY — HYDROCELECTOMY
Anesthesia: General | Laterality: Left

## 2019-08-02 MED ORDER — ORAL CARE MOUTH RINSE: 15.0000 mL | Freq: Once | OROMUCOSAL | Status: AC

## 2019-08-02 MED ORDER — INSULIN ASPART 100 UNIT/ML ~~LOC~~ SOLN: 7.0000 [IU] | Freq: Once | SUBCUTANEOUS | Status: AC

## 2019-08-02 MED ORDER — HYDROCODONE-ACETAMINOPHEN 5-325 MG PO TABS: 1.0000 | ORAL_TABLET | ORAL | 0 refills | Status: DC | PRN

## 2019-08-02 MED ORDER — CHLORHEXIDINE GLUCONATE 0.12 % MT SOLN
OROMUCOSAL | Status: AC
  Administered 2019-08-02: 15 mL via OROMUCOSAL
  Filled 2019-08-02: qty 15

## 2019-08-02 MED ORDER — SODIUM CHLORIDE 0.9 % IV SOLN: INTRAVENOUS | Status: DC

## 2019-08-02 MED ORDER — ACETAMINOPHEN 10 MG/ML IV SOLN
INTRAVENOUS | Status: DC | PRN
  Administered 2019-08-02: 1000 mg via INTRAVENOUS

## 2019-08-02 MED ORDER — PROPOFOL 10 MG/ML IV BOLUS
INTRAVENOUS | Status: AC
  Filled 2019-08-02: qty 20

## 2019-08-02 MED ORDER — FENTANYL CITRATE (PF) 100 MCG/2ML IJ SOLN
INTRAMUSCULAR | Status: DC | PRN
  Administered 2019-08-02 (×2): 50 ug via INTRAVENOUS

## 2019-08-02 MED ORDER — FAMOTIDINE 20 MG PO TABS: 20.0000 mg | ORAL_TABLET | Freq: Once | ORAL | Status: AC

## 2019-08-02 MED ORDER — ONDANSETRON HCL 4 MG/2ML IJ SOLN
INTRAMUSCULAR | Status: DC | PRN
  Administered 2019-08-02: 4 mg via INTRAVENOUS

## 2019-08-02 MED ORDER — BUPIVACAINE HCL 0.5 % IJ SOLN
INTRAMUSCULAR | Status: DC | PRN
  Administered 2019-08-02: 6 mL

## 2019-08-02 MED ORDER — BACITRACIN ZINC 500 UNIT/GM EX OINT
TOPICAL_OINTMENT | CUTANEOUS | Status: DC | PRN
  Administered 2019-08-02: 1 via TOPICAL

## 2019-08-02 MED ORDER — PROPOFOL 10 MG/ML IV BOLUS
INTRAVENOUS | Status: DC | PRN
  Administered 2019-08-02: 150 mg via INTRAVENOUS

## 2019-08-02 MED ORDER — ACETAMINOPHEN 10 MG/ML IV SOLN
INTRAVENOUS | Status: AC
  Filled 2019-08-02: qty 100

## 2019-08-02 MED ORDER — LIDOCAINE HCL (CARDIAC) PF 100 MG/5ML IV SOSY
PREFILLED_SYRINGE | INTRAVENOUS | Status: DC | PRN
  Administered 2019-08-02: 100 mg via INTRAVENOUS

## 2019-08-02 MED ORDER — OXYCODONE HCL 5 MG PO TABS
5.0000 mg | ORAL_TABLET | Freq: Once | ORAL | Status: AC | PRN
  Administered 2019-08-02: 5 mg via ORAL

## 2019-08-02 MED ORDER — PHENYLEPHRINE HCL (PRESSORS) 10 MG/ML IV SOLN
INTRAVENOUS | Status: AC
  Filled 2019-08-02: qty 1

## 2019-08-02 MED ORDER — LIDOCAINE HCL (PF) 2 % IJ SOLN
INTRAMUSCULAR | Status: AC
  Filled 2019-08-02: qty 5

## 2019-08-02 MED ORDER — CHLORHEXIDINE GLUCONATE 0.12 % MT SOLN: 15.0000 mL | Freq: Once | OROMUCOSAL | Status: AC

## 2019-08-02 MED ORDER — CIPROFLOXACIN IN D5W 400 MG/200ML IV SOLN
INTRAVENOUS | Status: AC
  Filled 2019-08-02: qty 200

## 2019-08-02 MED ORDER — OXYCODONE HCL 5 MG/5ML PO SOLN: 5.0000 mg | Freq: Once | ORAL | Status: AC | PRN

## 2019-08-02 MED ORDER — FAMOTIDINE 20 MG PO TABS
ORAL_TABLET | ORAL | Status: AC
  Administered 2019-08-02: 20 mg via ORAL
  Filled 2019-08-02: qty 1

## 2019-08-02 MED ORDER — FENTANYL CITRATE (PF) 100 MCG/2ML IJ SOLN: 25.0000 ug | INTRAMUSCULAR | Status: DC | PRN

## 2019-08-02 MED ORDER — OXYCODONE HCL 5 MG PO TABS
ORAL_TABLET | ORAL | Status: AC
  Filled 2019-08-02: qty 1

## 2019-08-02 MED ORDER — PHENYLEPHRINE HCL (PRESSORS) 10 MG/ML IV SOLN
INTRAVENOUS | Status: DC | PRN
  Administered 2019-08-02: 200 ug via INTRAVENOUS
  Administered 2019-08-02 (×2): 100 ug via INTRAVENOUS

## 2019-08-02 MED ORDER — INSULIN ASPART 100 UNIT/ML ~~LOC~~ SOLN
SUBCUTANEOUS | Status: AC
  Administered 2019-08-02: 7 [IU] via SUBCUTANEOUS
  Filled 2019-08-02: qty 1

## 2019-08-02 MED ORDER — BUPIVACAINE HCL (PF) 0.5 % IJ SOLN
INTRAMUSCULAR | Status: AC
  Filled 2019-08-02: qty 30

## 2019-08-02 MED ORDER — FENTANYL CITRATE (PF) 100 MCG/2ML IJ SOLN
INTRAMUSCULAR | Status: AC
  Filled 2019-08-02: qty 2

## 2019-08-02 MED ORDER — BACITRACIN ZINC 500 UNIT/GM EX OINT
TOPICAL_OINTMENT | CUTANEOUS | Status: AC
  Filled 2019-08-02: qty 28.35

## 2019-08-02 MED ORDER — GLYCOPYRROLATE 0.2 MG/ML IJ SOLN
INTRAMUSCULAR | Status: DC | PRN
  Administered 2019-08-02: .2 mg via INTRAVENOUS

## 2019-08-02 MED ORDER — ONDANSETRON HCL 4 MG/2ML IJ SOLN
INTRAMUSCULAR | Status: AC
  Filled 2019-08-02: qty 2

## 2019-08-02 MED ORDER — SULFAMETHOXAZOLE-TRIMETHOPRIM 800-160 MG PO TABS: 1.0000 | ORAL_TABLET | Freq: Two times a day (BID) | ORAL | 0 refills | Status: AC

## 2019-08-02 SURGICAL SUPPLY — 36 items
APL PRP STRL LF DISP 70% ISPRP (MISCELLANEOUS) ×1
BLADE CLIPPER SURG (BLADE) ×4 IMPLANT
BLADE SURG 15 STRL LF DISP TIS (BLADE) ×2 IMPLANT
BLADE SURG 15 STRL SS (BLADE) ×4
CANISTER SUCT 1200ML W/VALVE (MISCELLANEOUS) ×4 IMPLANT
CHLORAPREP W/TINT 26 (MISCELLANEOUS) ×4 IMPLANT
COVER WAND RF STERILE (DRAPES) ×4 IMPLANT
DRAIN PENROSE 1/4X12 LTX STRL (WOUND CARE) ×4 IMPLANT
DRAPE LAPAROTOMY 77X122 PED (DRAPES) ×4 IMPLANT
DRSG GAUZE FLUFF 36X18 (GAUZE/BANDAGES/DRESSINGS) ×4 IMPLANT
ELECT CAUTERY BLADE 6.4 (BLADE) ×4 IMPLANT
ELECT REM PT RETURN 9FT ADLT (ELECTROSURGICAL) ×4
ELECTRODE REM PT RTRN 9FT ADLT (ELECTROSURGICAL) ×2 IMPLANT
GAUZE SPONGE 4X4 12PLY STRL (GAUZE/BANDAGES/DRESSINGS) IMPLANT
GLOVE BIOGEL PI IND STRL 7.5 (GLOVE) ×6 IMPLANT
GLOVE BIOGEL PI INDICATOR 7.5 (GLOVE) ×6
GOWN STRL REUS W/ TWL LRG LVL3 (GOWN DISPOSABLE) ×4 IMPLANT
GOWN STRL REUS W/TWL LRG LVL3 (GOWN DISPOSABLE) ×8
GOWN STRL REUS W/TWL XL LVL4 (GOWN DISPOSABLE) ×4 IMPLANT
KIT TURNOVER KIT A (KITS) ×4 IMPLANT
LABEL OR SOLS (LABEL) IMPLANT
NEEDLE HYPO 25X1 1.5 SAFETY (NEEDLE) ×4 IMPLANT
NS IRRIG 500ML POUR BTL (IV SOLUTION) ×4 IMPLANT
PACK BASIN MINOR (MISCELLANEOUS) ×4 IMPLANT
SUPPORETR ATHLETIC LG (MISCELLANEOUS) IMPLANT
SUPPORTER ATHLETIC LG (MISCELLANEOUS)
SUPPORTER ATHLETIC XL (MISCELLANEOUS) ×4
SUPPORTER ATHLETIC XL 3X44-50X (MISCELLANEOUS) ×2 IMPLANT
SUT CHROMIC 3 0 PS 2 (SUTURE) IMPLANT
SUT CHROMIC 3 0 SH 27 (SUTURE) ×4 IMPLANT
SUT ETHILON 3-0 FS-10 30 BLK (SUTURE) ×4
SUT ETHILON NAB PS2 4-0 18IN (SUTURE) IMPLANT
SUT MNCRL 3-0 UNDYED SH (SUTURE) ×2 IMPLANT
SUT MONOCRYL 3-0 UNDYED (SUTURE) ×4
SUTURE EHLN 3-0 FS-10 30 BLK (SUTURE) ×2 IMPLANT
SYR 10ML LL (SYRINGE) ×4 IMPLANT

## 2019-08-02 NOTE — Interval H&P Note (Signed)
History and Physical Interval Note: He also requests excision of a right hemiscrotal skin tag  08/02/2019 7:16 AM  Adam [name]  has presented today for surgery, with the diagnosis of left hydrocele.  The various methods of treatment have been discussed with the patient and family. After consideration of risks, benefits and other options for treatment, the patient has consented to  Procedure(s): HYDROCELECTOMY ADULT (Left) as a surgical intervention.  The patient's history has been reviewed, patient examined, no change in status, stable for surgery.  I have reviewed the patient's chart and labs.  Questions were answered to the patient's satisfaction.     [place]

## 2019-08-02 NOTE — Transfer of Care (Signed)
Immediate Anesthesia Transfer of Care Note  Patient: Adam [name]  Procedure(s) Performed: HYDROCELECTOMY ADULT (Left ) EXCISION OF SKIN TAG  Patient Location: PACU  Anesthesia Type:General  Level of Consciousness: awake, drowsy and patient cooperative  Airway & Oxygen Therapy: Patient Spontanous Breathing and Patient connected to face mask oxygen  Post-op Assessment: Report given to RN and Post -op Vital signs reviewed and stable  Post vital signs: Reviewed and stable  Last Vitals:  Vitals Value Taken Time  BP 143/108 08/02/19 0858  Temp    Pulse 65 08/02/19 0859  Resp 14 08/02/19 0859  SpO2 100 % 08/02/19 0859  Vitals shown include unvalidated device data.  Last Pain:  Vitals:   08/02/19 0622  TempSrc: Tympanic  PainSc: 2          Complications: No apparent anesthesia complications
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
## 2019-08-02 NOTE — Op Note (Signed)
Preoperative diagnosis:  1. Left hydrocele 2. Right scrotal skin tag  Postoperative diagnosis:  1. Left hydrocele 2. Right scrotal content  Procedure: 1. Left hydrocelectomy 2. Excision right scrotal skin tag  Surgeon: [name], MD  Anesthesia: General  Complications: None  Intraoperative findings:  1. Large left hydrocele with thickened, adherent hydrocele sac.  Testis normal in appearance.  575 cc straw-colored fluid suctioned  2.  10 x 15 mm right scrotal skin tag  EBL: Minimal  Specimens: Hydrocele sac  Indication: Adam [name] is a 69 y.o. patient with progressive enlargement of the right hemiscrotum.  He complains of discomfort with tighter clothing and changes in position.  He also notes difficulty voiding secondary to penile retraction from the hydrocele.  He also has a right scrotal skin tag he desires excised after reviewing the management options for treatment, he elected to proceed with the above surgical procedure(s). We have discussed the potential benefits and risks of the procedure, side effects of the proposed treatment, the likelihood of the patient achieving the goals of the procedure, and any potential problems that might occur during the procedure or recuperation. Informed consent has been obtained.  Description of procedure:  The patient was taken to the operating room and general anesthesia was induced.  The patient was placed in the supine position, prepped and draped in the usual sterile fashion, and preoperative antibiotics were administered. A preoperative time-out was performed.   A due to the large size a 8 cm longitudinal incision was made in the median raphae.  Attention was directed to the left hemiscrotum and the dartos fascia was incised with cautery to expose the hydrocele sac.  The hydrocele sac was adherent to the underlying dartos and required a combination of sharp and blunt dissection and was subsequently delivered into the operative  field.  The sac was then incised anteriorly and 575 mL of straw-colored fluid was suctioned.  The hydrocele sac was then opened anteriorly with cautery.  The testis and cord structures were normal in appearance.  Staples from a prior vasectomy were noted in the medial aspect of the hydrocele sac.  There was no evidence of a communicating hydrocele.  The hydrocele sac was excised with cautery leaving a rim of approximately 2 cm around the testis and cord structures.  A portion of the excised segment that contain adherent vas from his previous vasectomy.  The edges were then whipstitched with a running 3-0 chromic suture.  The sac edges were then everted posterior to the testis and sutured together with a single figure-of-eight 3-0 chromic suture.  The testis and cord was then copiously irrigated and placed back into the left hemiscrotum in its anatomic position.  The left hemiscrotum was also copiously irrigated and hemostasis was noted to be adequate.  A 1/4 inch Penrose drain was placed through the dependent portion of the left hemiscrotum via a separate stab incision and secured with 4-0 nylon.    The incision was anesthetized with 0.25% plain Sensorcaine.  The dartos was closed with a running suture of 3-0 Monocryl and the skin was closed with a running horizontal mattress suture of 3-0 Monocryl.   Attention was then directed to the right hemiscrotum and the scrotal skin tag was elevated and the small stalk was excised with iris scissors.  A single 3-0 Monocryl suture was placed.  Bacitracin ointment was applied to the incision and A dressing of fluffs and scrotal support were applied.  After asked that reversal he  was transported to the PACU in stable condition.   [name], M.D.

## 2019-08-02 NOTE — Discharge Instructions (Signed)
AMBULATORY SURGERY  DISCHARGE INSTRUCTIONS   1) The drugs that you were given will stay in your system until tomorrow so for the next 24 hours you should not:  A) Drive an automobile B) Make any legal decisions C) Drink any alcoholic beverage   2) You may resume regular meals tomorrow.  Today it is better to start with liquids and gradually work up to solid foods.  You may eat anything you prefer, but it is better to start with liquids, then soup and crackers, and gradually work up to solid foods.   3) Please notify your doctor immediately if you have any unusual bleeding, trouble breathing, redness and pain at the surgery site, drainage, fever, or pain not relieved by medication. 4)   5) Your post-operative visit with Dr.                                     is: Date:                        Time:    Please call to schedule your post-operative visit.  Additional Instructions:  Discharge instructions following scrotal surgery  Call your doctor for:  Fever is greater than 100.5  Severe nausea or vomiting  Increasing pain not controlled by pain medication  Increasing redness or drainage from incisions  The number for questions or concerns is [phone]  Activity level: No lifting greater than 10 pounds (about equal to gallon of milk) for the next 2 weeks or until cleared to do so at follow-up appointment.  Otherwise activity as tolerated by comfort level.  Diet: May resume your regular diet as tolerated  Driving: No driving while still taking opiate pain medications (weight at least 6-8 hours after last dose).  No driving if you still sore from surgery as it may limit her ability to react quickly if necessary.   Shower/bath: May shower 48 hours after surgery.  No tub bath, hot tub or swimming for 10 days.  Wound care: He may cover wounds with sterile gauze as needed to prevent incisions rubbing on close follow-up in any seepage.  Where tight fitting underpants for  at least 2 weeks.  He should apply cold compresses (ice or sac of frozen peas/corn) to your scrotum for at least 48 hours to reduce the swelling.  You should expect that his scrotum will swell up initially and then get smaller over the next 2-4 weeks.  Follow-up appointments: Follow-up appointment will be scheduled with [name] in 4 weeks for a wound check.

## 2019-08-02 NOTE — OR Nursing (Signed)
May resume aspirin tomorrow 08/03/19 per Dr. Elenor Quinones ([place]/OR room)  Added to discharge instructions/med section.

## 2019-08-02 NOTE — Anesthesia Procedure Notes (Signed)
Procedure Name: LMA Insertion Date/Time: 08/02/2019 7:40 AM Performed by: Lowry Bowl, CRNA Pre-anesthesia Checklist: Patient identified, Emergency Drugs available, Suction available and Patient being monitored Patient Re-evaluated:Patient Re-evaluated prior to induction Oxygen Delivery Method: Circle system utilized Preoxygenation: Pre-oxygenation with 100% oxygen Induction Type: IV induction LMA: LMA inserted LMA Size: 4.0 Number of attempts: 1 Placement Confirmation: positive ETCO2 and breath sounds checked- equal and bilateral Tube secured with: Tape Dental Injury: Teeth and Oropharynx as per pre-operative assessment

## 2019-08-03 ENCOUNTER — Encounter: Payer: BC Managed Care – PPO | Admitting: Internal Medicine

## 2019-08-03 NOTE — Anesthesia Postprocedure Evaluation (Signed)
Anesthesia Post Note  Patient: NYSHAUN STANDAGE  Procedure(s) Performed: HYDROCELECTOMY ADULT (Left ) EXCISION OF SKIN TAG  Patient location during evaluation: PACU Anesthesia Type: General Level of consciousness: awake and alert Pain management: pain level controlled Vital Signs Assessment: post-procedure vital signs reviewed and stable Respiratory status: spontaneous breathing, nonlabored ventilation, respiratory function stable and patient connected to nasal cannula oxygen Cardiovascular status: blood pressure returned to baseline and stable Postop Assessment: no apparent nausea or vomiting Anesthetic complications: no     Last Vitals:  Vitals:   08/02/19 0956 08/02/19 1034  BP: (!) 153/86 (!) 154/87  Pulse: 66 62  Resp: 16 16  Temp: (!) 36.2 C (!) 36 C  SpO2: 98% 98%    Last Pain:  Vitals:   08/02/19 1034  TempSrc: Temporal  PainSc: 5                  Precious Haws Nadezhda Pollitt

## 2019-08-04 LAB — SURGICAL PATHOLOGY

## 2019-08-05 ENCOUNTER — Other Ambulatory Visit: Payer: Self-pay

## 2019-08-05 ENCOUNTER — Encounter: Payer: Self-pay | Admitting: Physician Assistant

## 2019-08-05 ENCOUNTER — Ambulatory Visit: Payer: Medicare PPO | Admitting: Physician Assistant

## 2019-08-05 VITALS — BP 129/78 | HR 78 | Ht 71.0 in | Wt 264.0 lb

## 2019-08-05 DIAGNOSIS — N433 Hydrocele, unspecified: Secondary | ICD-10-CM

## 2019-08-05 DIAGNOSIS — Z4803 Encounter for change or removal of drains: Secondary | ICD-10-CM
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
## 2019-08-05 NOTE — Patient Instructions (Addendum)
1. Continue scrotal support: 1. Wear a jock strap or supportive underwear (e.g. spandex-containing boxer briefs that are snug) at all times. 2. Use ice to ease discomfort on the scrotum. Always keep a layer of fabric between the ice and your skin and never ice for longer than 20 minutes at a time. 3. Use a rolled up washcloth or paper towel to elevate your scrotum when you are sitting or lying down to ease drainage from your scrotum. 2. Continue stool softeners to help you have a bowel movement. Make sure you are staying well hydrated and walking around to get your bowels moving again. 3. Keep your drain incision covered with gauze and a Tegaderm for the next 3-4 days. After that point, you will not need to keep it covered. You can bathe the whole time.

## 2019-08-07 ENCOUNTER — Inpatient Hospital Stay
Admission: EM | Admit: 2019-08-07 | Discharge: 2019-08-11 | DRG: 863 | Disposition: A | Discharge status: Home Health | Payer: Medicare PPO | Attending: Internal Medicine | Admitting: Internal Medicine

## 2019-08-07 ENCOUNTER — Other Ambulatory Visit: Payer: Self-pay

## 2019-08-07 DIAGNOSIS — G4730 Sleep apnea, unspecified: Secondary | ICD-10-CM | POA: Diagnosis present

## 2019-08-07 DIAGNOSIS — H409 Unspecified glaucoma: Secondary | ICD-10-CM | POA: Diagnosis present

## 2019-08-07 DIAGNOSIS — Z808 Family history of malignant neoplasm of other organs or systems: Secondary | ICD-10-CM | POA: Diagnosis not present

## 2019-08-07 DIAGNOSIS — Z87891 Personal history of nicotine dependence: Secondary | ICD-10-CM

## 2019-08-07 DIAGNOSIS — T8140XA Infection following a procedure, unspecified, initial encounter: Principal | ICD-10-CM

## 2019-08-07 DIAGNOSIS — Z20822 Contact with and (suspected) exposure to covid-19: Secondary | ICD-10-CM | POA: Diagnosis present

## 2019-08-07 DIAGNOSIS — Z79899 Other long term (current) drug therapy: Secondary | ICD-10-CM

## 2019-08-07 DIAGNOSIS — Z8249 Family history of ischemic heart disease and other diseases of the circulatory system: Secondary | ICD-10-CM

## 2019-08-07 DIAGNOSIS — N1832 Chronic kidney disease, stage 3b: Secondary | ICD-10-CM | POA: Diagnosis present

## 2019-08-07 DIAGNOSIS — K219 Gastro-esophageal reflux disease without esophagitis: Secondary | ICD-10-CM | POA: Diagnosis present

## 2019-08-07 DIAGNOSIS — N2889 Other specified disorders of kidney and ureter: Secondary | ICD-10-CM | POA: Diagnosis present

## 2019-08-07 DIAGNOSIS — H109 Unspecified conjunctivitis: Secondary | ICD-10-CM | POA: Diagnosis not present

## 2019-08-07 DIAGNOSIS — N179 Acute kidney failure, unspecified: Secondary | ICD-10-CM | POA: Diagnosis present

## 2019-08-07 DIAGNOSIS — M19072 Primary osteoarthritis, left ankle and foot: Secondary | ICD-10-CM | POA: Diagnosis present

## 2019-08-07 DIAGNOSIS — Z881 Allergy status to other antibiotic agents status: Secondary | ICD-10-CM

## 2019-08-07 DIAGNOSIS — I5032 Chronic diastolic (congestive) heart failure: Secondary | ICD-10-CM | POA: Diagnosis present

## 2019-08-07 DIAGNOSIS — Z794 Long term (current) use of insulin: Secondary | ICD-10-CM | POA: Diagnosis not present

## 2019-08-07 DIAGNOSIS — N189 Chronic kidney disease, unspecified: Secondary | ICD-10-CM | POA: Diagnosis not present

## 2019-08-07 DIAGNOSIS — E1122 Type 2 diabetes mellitus with diabetic chronic kidney disease: Secondary | ICD-10-CM | POA: Diagnosis present

## 2019-08-07 DIAGNOSIS — Z818 Family history of other mental and behavioral disorders: Secondary | ICD-10-CM

## 2019-08-07 DIAGNOSIS — Z961 Presence of intraocular lens: Secondary | ICD-10-CM | POA: Diagnosis present

## 2019-08-07 DIAGNOSIS — T8149XA Infection following a procedure, other surgical site, initial encounter: Secondary | ICD-10-CM | POA: Diagnosis not present

## 2019-08-07 DIAGNOSIS — Z886 Allergy status to analgesic agent status: Secondary | ICD-10-CM

## 2019-08-07 DIAGNOSIS — I130 Hypertensive heart and chronic kidney disease with heart failure and stage 1 through stage 4 chronic kidney disease, or unspecified chronic kidney disease: Secondary | ICD-10-CM | POA: Diagnosis present

## 2019-08-07 DIAGNOSIS — E113519 Type 2 diabetes mellitus with proliferative diabetic retinopathy with macular edema, unspecified eye: Secondary | ICD-10-CM | POA: Diagnosis present

## 2019-08-07 DIAGNOSIS — N183 Chronic kidney disease, stage 3 unspecified: Secondary | ICD-10-CM | POA: Diagnosis not present

## 2019-08-07 DIAGNOSIS — N400 Enlarged prostate without lower urinary tract symptoms: Secondary | ICD-10-CM | POA: Diagnosis present

## 2019-08-07 DIAGNOSIS — K5730 Diverticulosis of large intestine without perforation or abscess without bleeding: Secondary | ICD-10-CM | POA: Diagnosis present

## 2019-08-07 DIAGNOSIS — H5711 Ocular pain, right eye: Secondary | ICD-10-CM

## 2019-08-07 DIAGNOSIS — Z833 Family history of diabetes mellitus: Secondary | ICD-10-CM | POA: Diagnosis not present

## 2019-08-07 DIAGNOSIS — N492 Inflammatory disorders of scrotum: Secondary | ICD-10-CM | POA: Diagnosis present

## 2019-08-07 DIAGNOSIS — K5790 Diverticulosis of intestine, part unspecified, without perforation or abscess without bleeding: Secondary | ICD-10-CM | POA: Diagnosis not present

## 2019-08-07 DIAGNOSIS — Z7982 Long term (current) use of aspirin: Secondary | ICD-10-CM

## 2019-08-07 DIAGNOSIS — E1169 Type 2 diabetes mellitus with other specified complication: Secondary | ICD-10-CM

## 2019-08-07 DIAGNOSIS — H547 Unspecified visual loss: Secondary | ICD-10-CM | POA: Diagnosis not present

## 2019-08-07 DIAGNOSIS — I10 Essential (primary) hypertension: Secondary | ICD-10-CM | POA: Diagnosis present

## 2019-08-07 DIAGNOSIS — E785 Hyperlipidemia, unspecified: Secondary | ICD-10-CM | POA: Diagnosis present

## 2019-08-07 DIAGNOSIS — H1011 Acute atopic conjunctivitis, right eye: Secondary | ICD-10-CM | POA: Diagnosis not present

## 2019-08-07 DIAGNOSIS — N184 Chronic kidney disease, stage 4 (severe): Secondary | ICD-10-CM

## 2019-08-07 LAB — CBC
HCT: 32.7 % — ABNORMAL LOW (ref 39.0–52.0)
Hemoglobin: 10.9 g/dL — ABNORMAL LOW (ref 13.0–17.0)
MCH: 28.3 pg (ref 26.0–34.0)
MCHC: 33.3 g/dL (ref 30.0–36.0)
MCV: 84.9 fL (ref 80.0–100.0)
Platelets: 267 10*3/uL (ref 150–400)
RBC: 3.85 MIL/uL — ABNORMAL LOW (ref 4.22–5.81)
RDW: 14.5 % (ref 11.5–15.5)
WBC: 8.9 10*3/uL (ref 4.0–10.5)
nRBC: 0 % (ref 0.0–0.2)

## 2019-08-07 LAB — COMPREHENSIVE METABOLIC PANEL
ALT: 19 U/L (ref 0–44)
AST: 24 U/L (ref 15–41)
Albumin: 3.2 g/dL — ABNORMAL LOW (ref 3.5–5.0)
Alkaline Phosphatase: 67 U/L (ref 38–126)
Anion gap: 10 (ref 5–15)
BUN: 29 mg/dL — ABNORMAL HIGH (ref 8–23)
CO2: 23 mmol/L (ref 22–32)
Calcium: 9.2 mg/dL (ref 8.9–10.3)
Chloride: 102 mmol/L (ref 98–111)
Creatinine, Ser: 1.89 mg/dL — ABNORMAL HIGH (ref 0.61–1.24)
GFR calc Af Amer: 41 mL/min — ABNORMAL LOW (ref >60)
GFR calc non Af Amer: 35 mL/min — ABNORMAL LOW (ref >60)
Glucose, Bld: 184 mg/dL — ABNORMAL HIGH (ref 70–99)
Potassium: 4.1 mmol/L (ref 3.5–5.1)
Sodium: 135 mmol/L (ref 135–145)
Total Bilirubin: 0.8 mg/dL (ref 0.3–1.2)
Total Protein: 8.9 g/dL — ABNORMAL HIGH (ref 6.5–8.1)

## 2019-08-07 LAB — LIPASE, BLOOD: Lipase: 25 U/L (ref 11–51)

## 2019-08-07 MED ORDER — PIPERACILLIN-TAZOBACTAM 3.375 G IVPB 30 MIN
3.3750 g | Freq: Once | INTRAVENOUS | Status: AC
  Administered 2019-08-07: 3.375 g via INTRAVENOUS
  Filled 2019-08-07: qty 50
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
## 2019-08-07 NOTE — ED Notes (Signed)
Pt had hydrocele repair and has active bleeding at drainage site. Pt also reports nausea/vomiting over last 2 days with last episode at approx 1700 today. Pt was able to urinate at approx 1800 today. Secondary complaint of right eye irritation and blurring of vision. Eye appears erythematous. Dr Owens Shark notified.

## 2019-08-07 NOTE — ED Triage Notes (Signed)
Patient had left hydrocele surgery on Tuesday. Patient c/o N/V since Thursday. Patient denies diarrhea. Patient denies abdominal pain.   Redness noted to right eye. Patient c/o right eye.

## 2019-08-08 ENCOUNTER — Inpatient Hospital Stay: Admit: 2019-08-08 | Payer: Medicare PPO | Admitting: Urology

## 2019-08-08 ENCOUNTER — Encounter: Payer: Self-pay | Admitting: Radiology

## 2019-08-08 ENCOUNTER — Observation Stay: Payer: Medicare PPO | Admitting: Anesthesiology

## 2019-08-08 ENCOUNTER — Emergency Department: Payer: Medicare PPO

## 2019-08-08 ENCOUNTER — Encounter
Admission: EM | Disposition: A | Discharge status: Home Health | Payer: Self-pay | Source: Home / Self Care | Attending: Internal Medicine

## 2019-08-08 DIAGNOSIS — N189 Chronic kidney disease, unspecified: Secondary | ICD-10-CM

## 2019-08-08 DIAGNOSIS — N184 Chronic kidney disease, stage 4 (severe): Secondary | ICD-10-CM

## 2019-08-08 DIAGNOSIS — E1169 Type 2 diabetes mellitus with other specified complication: Secondary | ICD-10-CM

## 2019-08-08 DIAGNOSIS — T8140XA Infection following a procedure, unspecified, initial encounter: Secondary | ICD-10-CM

## 2019-08-08 DIAGNOSIS — E785 Hyperlipidemia, unspecified: Secondary | ICD-10-CM

## 2019-08-08 DIAGNOSIS — H5711 Ocular pain, right eye: Secondary | ICD-10-CM | POA: Diagnosis not present

## 2019-08-08 DIAGNOSIS — N1832 Chronic kidney disease, stage 3b: Secondary | ICD-10-CM

## 2019-08-08 DIAGNOSIS — N492 Inflammatory disorders of scrotum: Secondary | ICD-10-CM

## 2019-08-08 DIAGNOSIS — N179 Acute kidney failure, unspecified: Secondary | ICD-10-CM

## 2019-08-08 DIAGNOSIS — N2889 Other specified disorders of kidney and ureter: Secondary | ICD-10-CM

## 2019-08-08 HISTORY — PX: INCISION AND DRAINAGE ABSCESS: SHX5864

## 2019-08-08 LAB — GLUCOSE, CAPILLARY
Glucose-Capillary: 134 mg/dL — ABNORMAL HIGH (ref 70–99)
Glucose-Capillary: 215 mg/dL — ABNORMAL HIGH (ref 70–99)
Glucose-Capillary: 142 mg/dL — ABNORMAL HIGH (ref 70–99)
Glucose-Capillary: 197 mg/dL — ABNORMAL HIGH (ref 70–99)

## 2019-08-08 LAB — URINALYSIS, COMPLETE (UACMP) WITH MICROSCOPIC
Bacteria, UA: NONE SEEN
Bilirubin Urine: NEGATIVE
Glucose, UA: NEGATIVE mg/dL
Ketones, ur: NEGATIVE mg/dL
Leukocytes,Ua: NEGATIVE
Nitrite: NEGATIVE
Protein, ur: >=300 mg/dL — AB
Specific Gravity, Urine: 1.022 (ref 1.005–1.030)
Squamous Epithelial / HPF: NONE SEEN (ref 0–5)
pH: 6 (ref 5.0–8.0)

## 2019-08-08 LAB — HEMOGLOBIN A1C
Hgb A1c MFr Bld: 9.7 % — ABNORMAL HIGH (ref 4.8–5.6)
Mean Plasma Glucose: 231.69 mg/dL

## 2019-08-08 LAB — HIV ANTIBODY (ROUTINE TESTING W REFLEX): HIV Screen 4th Generation wRfx: NONREACTIVE

## 2019-08-08 LAB — CREATININE, SERUM
Creatinine, Ser: 1.94 mg/dL — ABNORMAL HIGH (ref 0.61–1.24)
GFR calc Af Amer: 39 mL/min — ABNORMAL LOW (ref >60)
GFR calc non Af Amer: 34 mL/min — ABNORMAL LOW (ref >60)

## 2019-08-08 LAB — SARS CORONAVIRUS 2 BY RT PCR (HOSPITAL ORDER, PERFORMED IN ~~LOC~~ HOSPITAL LAB): SARS Coronavirus 2: NEGATIVE

## 2019-08-08 SURGERY — INCISION AND DRAINAGE, ABSCESS
Anesthesia: General

## 2019-08-08 MED ORDER — INSULIN ASPART 100 UNIT/ML ~~LOC~~ SOLN: 0.0000 [IU] | Freq: Every day | SUBCUTANEOUS | Status: DC

## 2019-08-08 MED ORDER — FENTANYL CITRATE (PF) 100 MCG/2ML IJ SOLN
INTRAMUSCULAR | Status: DC | PRN
  Administered 2019-08-08 (×2): 50 ug via INTRAVENOUS

## 2019-08-08 MED ORDER — VANCOMYCIN HCL 1750 MG/350ML IV SOLN: 1750.0000 mg | INTRAVENOUS | Status: DC

## 2019-08-08 MED ORDER — BRIMONIDINE TARTRATE-TIMOLOL 0.2-0.5 % OP SOLN
1.0000 [drp] | Freq: Two times a day (BID) | OPHTHALMIC | Status: DC
  Filled 2019-08-08 (×2): qty 5

## 2019-08-08 MED ORDER — MIDAZOLAM HCL 2 MG/2ML IJ SOLN
INTRAMUSCULAR | Status: AC
  Filled 2019-08-08: qty 2

## 2019-08-08 MED ORDER — ATORVASTATIN CALCIUM 80 MG PO TABS
80.0000 mg | ORAL_TABLET | Freq: Every day | ORAL | Status: DC
  Administered 2019-08-08 – 2019-08-10 (×3): 80 mg via ORAL
  Filled 2019-08-08 (×2): qty 4
  Filled 2019-08-08 (×2): qty 1
  Filled 2019-08-08: qty 4
  Filled 2019-08-08 (×2): qty 1

## 2019-08-08 MED ORDER — TIMOLOL MALEATE 0.5 % OP SOLN
1.0000 [drp] | Freq: Two times a day (BID) | OPHTHALMIC | Status: DC
  Administered 2019-08-08 – 2019-08-11 (×6): 1 [drp] via OPHTHALMIC
  Filled 2019-08-08: qty 5

## 2019-08-08 MED ORDER — INSULIN ASPART PROT & ASPART (70-30 MIX) 100 UNIT/ML ~~LOC~~ SUSP: 50.0000 [IU] | Freq: Two times a day (BID) | SUBCUTANEOUS | Status: DC

## 2019-08-08 MED ORDER — HYDROCODONE-ACETAMINOPHEN 5-325 MG PO TABS
1.0000 | ORAL_TABLET | ORAL | Status: DC | PRN
  Administered 2019-08-08: 2 via ORAL
  Filled 2019-08-08: qty 2

## 2019-08-08 MED ORDER — INSULIN ASPART PROT & ASPART (70-30 MIX) 100 UNIT/ML ~~LOC~~ SUSP
25.0000 [IU] | Freq: Two times a day (BID) | SUBCUTANEOUS | Status: DC
  Administered 2019-08-08 – 2019-08-11 (×6): 25 [IU] via SUBCUTANEOUS
  Filled 2019-08-08 (×6): qty 10

## 2019-08-08 MED ORDER — FENTANYL CITRATE (PF) 100 MCG/2ML IJ SOLN: 25.0000 ug | INTRAMUSCULAR | Status: DC | PRN

## 2019-08-08 MED ORDER — INSULIN ASPART 100 UNIT/ML ~~LOC~~ SOLN
0.0000 [IU] | Freq: Three times a day (TID) | SUBCUTANEOUS | Status: DC
  Administered 2019-08-08: 2 [IU] via SUBCUTANEOUS
  Administered 2019-08-08: 5 [IU] via SUBCUTANEOUS
  Administered 2019-08-09: 2 [IU] via SUBCUTANEOUS
  Administered 2019-08-10 (×2): 3 [IU] via SUBCUTANEOUS
  Filled 2019-08-08 (×5): qty 1

## 2019-08-08 MED ORDER — MORPHINE SULFATE (PF) 2 MG/ML IV SOLN: 2.0000 mg | INTRAVENOUS | Status: DC | PRN

## 2019-08-08 MED ORDER — BUPIVACAINE HCL (PF) 0.5 % IJ SOLN
INTRAMUSCULAR | Status: AC
  Filled 2019-08-08: qty 30

## 2019-08-08 MED ORDER — PHENYLEPHRINE HCL 0.5 % NA SOLN
1.0000 [drp] | Freq: Every evening | NASAL | Status: DC | PRN
  Administered 2019-08-08: 1 [drp] via NASAL
  Filled 2019-08-08: qty 15

## 2019-08-08 MED ORDER — LIDOCAINE HCL (PF) 1 % IJ SOLN
INTRAMUSCULAR | Status: AC
  Filled 2019-08-08: qty 30

## 2019-08-08 MED ORDER — FENTANYL CITRATE (PF) 100 MCG/2ML IJ SOLN
INTRAMUSCULAR | Status: AC
  Filled 2019-08-08: qty 2

## 2019-08-08 MED ORDER — ONDANSETRON HCL 4 MG PO TABS: 4.0000 mg | ORAL_TABLET | Freq: Four times a day (QID) | ORAL | Status: DC | PRN

## 2019-08-08 MED ORDER — ACETAMINOPHEN 650 MG RE SUPP: 650.0000 mg | Freq: Four times a day (QID) | RECTAL | Status: DC | PRN

## 2019-08-08 MED ORDER — VANCOMYCIN HCL 1250 MG/250ML IV SOLN
1250.0000 mg | INTRAVENOUS | Status: DC
  Administered 2019-08-08 – 2019-08-10 (×3): 1250 mg via INTRAVENOUS
  Filled 2019-08-08 (×5): qty 250

## 2019-08-08 MED ORDER — ONDANSETRON HCL 4 MG/2ML IJ SOLN
INTRAMUSCULAR | Status: DC | PRN
  Administered 2019-08-08: 4 mg via INTRAVENOUS

## 2019-08-08 MED ORDER — VANCOMYCIN HCL IN DEXTROSE 1-5 GM/200ML-% IV SOLN
1000.0000 mg | Freq: Once | INTRAVENOUS | Status: AC
  Administered 2019-08-08: 1000 mg via INTRAVENOUS
  Filled 2019-08-08: qty 200

## 2019-08-08 MED ORDER — SENNOSIDES-DOCUSATE SODIUM 8.6-50 MG PO TABS: 1.0000 | ORAL_TABLET | Freq: Every evening | ORAL | Status: DC | PRN

## 2019-08-08 MED ORDER — VANCOMYCIN HCL IN DEXTROSE 1-5 GM/200ML-% IV SOLN
1000.0000 mg | Freq: Once | INTRAVENOUS | Status: DC
  Filled 2019-08-08: qty 200

## 2019-08-08 MED ORDER — LACTATED RINGERS IV SOLN: INTRAVENOUS | Status: DC | PRN

## 2019-08-08 MED ORDER — PROPOFOL 10 MG/ML IV BOLUS
INTRAVENOUS | Status: AC
  Filled 2019-08-08: qty 20

## 2019-08-08 MED ORDER — PIPERACILLIN-TAZOBACTAM 3.375 G IVPB
3.3750 g | Freq: Three times a day (TID) | INTRAVENOUS | Status: DC
  Administered 2019-08-08 – 2019-08-11 (×9): 3.375 g via INTRAVENOUS
  Filled 2019-08-08 (×9): qty 50

## 2019-08-08 MED ORDER — ACETAMINOPHEN 325 MG PO TABS: 650.0000 mg | ORAL_TABLET | Freq: Four times a day (QID) | ORAL | Status: DC | PRN

## 2019-08-08 MED ORDER — PROPOFOL 10 MG/ML IV BOLUS
INTRAVENOUS | Status: DC | PRN
  Administered 2019-08-08: 150 mg via INTRAVENOUS
  Administered 2019-08-08: 50 mg via INTRAVENOUS

## 2019-08-08 MED ORDER — NEOMYCIN-POLYMYXIN-DEXAMETH 3.5-10000-0.1 OP OINT
TOPICAL_OINTMENT | Freq: Three times a day (TID) | OPHTHALMIC | Status: DC
  Administered 2019-08-10: 1 via OPHTHALMIC
  Filled 2019-08-08: qty 3.5

## 2019-08-08 MED ORDER — ONDANSETRON HCL 4 MG/2ML IJ SOLN: 4.0000 mg | Freq: Once | INTRAMUSCULAR | Status: DC | PRN

## 2019-08-08 MED ORDER — SUCCINYLCHOLINE CHLORIDE 20 MG/ML IJ SOLN
INTRAMUSCULAR | Status: DC | PRN
  Administered 2019-08-08: 120 mg via INTRAVENOUS

## 2019-08-08 MED ORDER — IOHEXOL 300 MG/ML  SOLN
100.0000 mL | Freq: Once | INTRAMUSCULAR | Status: AC | PRN
  Administered 2019-08-08: 100 mL via INTRAVENOUS

## 2019-08-08 MED ORDER — ROCURONIUM BROMIDE 100 MG/10ML IV SOLN
INTRAVENOUS | Status: DC | PRN
  Administered 2019-08-08: 5 mg via INTRAVENOUS

## 2019-08-08 MED ORDER — LIDOCAINE HCL (CARDIAC) PF 100 MG/5ML IV SOSY
PREFILLED_SYRINGE | INTRAVENOUS | Status: DC | PRN
  Administered 2019-08-08: 80 mg via INTRAVENOUS

## 2019-08-08 MED ORDER — ENOXAPARIN SODIUM 40 MG/0.4ML ~~LOC~~ SOLN
40.0000 mg | SUBCUTANEOUS | Status: DC
  Administered 2019-08-08 – 2019-08-11 (×4): 40 mg via SUBCUTANEOUS
  Filled 2019-08-08 (×4): qty 0.4

## 2019-08-08 MED ORDER — BRIMONIDINE TARTRATE 0.2 % OP SOLN
1.0000 [drp] | Freq: Two times a day (BID) | OPHTHALMIC | Status: DC
  Administered 2019-08-08 – 2019-08-11 (×6): 1 [drp] via OPHTHALMIC
  Filled 2019-08-08: qty 5

## 2019-08-08 MED ORDER — ONDANSETRON HCL 4 MG/2ML IJ SOLN: 4.0000 mg | Freq: Four times a day (QID) | INTRAMUSCULAR | Status: DC | PRN

## 2019-08-08 MED ORDER — ONDANSETRON HCL 4 MG/2ML IJ SOLN
INTRAMUSCULAR | Status: AC
  Filled 2019-08-08: qty 2

## 2019-08-08 MED ORDER — SEVOFLURANE IN SOLN
RESPIRATORY_TRACT | Status: AC
  Filled 2019-08-08: qty 250

## 2019-08-08 MED ORDER — SODIUM CHLORIDE 0.9 % IV SOLN: INTRAVENOUS | Status: DC

## 2019-08-08 SURGICAL SUPPLY — 37 items
APL PRP STRL LF DISP 70% ISPRP (MISCELLANEOUS) ×1
BNDG CONFORM 2 STRL LF (GAUZE/BANDAGES/DRESSINGS) ×3 IMPLANT
CANISTER SUCT 1200ML W/VALVE (MISCELLANEOUS) ×3 IMPLANT
CHLORAPREP W/TINT 26 (MISCELLANEOUS) ×3 IMPLANT
COVER WAND RF STERILE (DRAPES) ×3 IMPLANT
DERMABOND ADVANCED (GAUZE/BANDAGES/DRESSINGS) ×2
DERMABOND ADVANCED .7 DNX12 (GAUZE/BANDAGES/DRESSINGS) ×1 IMPLANT
DRAIN PENROSE 1/4X12 LTX STRL (WOUND CARE) ×3 IMPLANT
DRAPE LAPAROTOMY 77X122 PED (DRAPES) ×3 IMPLANT
DRSG GAUZE FLUFF 36X18 (GAUZE/BANDAGES/DRESSINGS) ×3 IMPLANT
ELECT REM PT RETURN 9FT ADLT (ELECTROSURGICAL) ×3
ELECTRODE REM PT RTRN 9FT ADLT (ELECTROSURGICAL) ×1 IMPLANT
GAUZE SPONGE 4X4 12PLY STRL (GAUZE/BANDAGES/DRESSINGS) ×3 IMPLANT
GLOVE BIOGEL PI IND STRL 7.5 (GLOVE) ×1 IMPLANT
GLOVE BIOGEL PI INDICATOR 7.5 (GLOVE) ×2
GOWN STRL REUS W/ TWL LRG LVL3 (GOWN DISPOSABLE) ×2 IMPLANT
GOWN STRL REUS W/ TWL XL LVL3 (GOWN DISPOSABLE) ×1 IMPLANT
GOWN STRL REUS W/TWL LRG LVL3 (GOWN DISPOSABLE) ×6
GOWN STRL REUS W/TWL XL LVL3 (GOWN DISPOSABLE) ×3
KIT TURNOVER KIT A (KITS) ×3 IMPLANT
LABEL OR SOLS (LABEL) ×3 IMPLANT
NEEDLE HYPO 25X1 1.5 SAFETY (NEEDLE) ×3 IMPLANT
NS IRRIG 500ML POUR BTL (IV SOLUTION) ×3 IMPLANT
PACK BASIN MINOR (MISCELLANEOUS) ×3 IMPLANT
SOL PREP PVP 2OZ (MISCELLANEOUS) ×3
SOLUTION PREP PVP 2OZ (MISCELLANEOUS) ×1 IMPLANT
SUPPORETR ATHLETIC LG (MISCELLANEOUS) ×1 IMPLANT
SUPPORTER ATHLETIC LG (MISCELLANEOUS) ×3
SUT ETHILON 3-0 FS-10 30 BLK (SUTURE) ×3
SUT MNCRL 3-0 UNDYED SH (SUTURE) ×1 IMPLANT
SUT MONOCRYL 3-0 UNDYED (SUTURE) ×3
SUT VIC AB 3-0 SH 27 (SUTURE) ×6
SUT VIC AB 3-0 SH 27X BRD (SUTURE) ×2 IMPLANT
SUT VIC AB 4-0 SH 27 (SUTURE) ×3
SUT VIC AB 4-0 SH 27XANBCTRL (SUTURE) ×1 IMPLANT
SUTURE EHLN 3-0 FS-10 30 BLK (SUTURE) ×1 IMPLANT
SYR 10ML LL (SYRINGE) ×3 IMPLANT

## 2019-08-08 NOTE — ED Notes (Signed)
Report given to OR RN. OR RN informed that patient has been drinking water. Patient and family informed that patient is now NPO. Patient and family verbalized understanding.

## 2019-08-08 NOTE — ED Notes (Signed)
Pt with sanginouspurulent drainage noted from surgical incision to scrotum.

## 2019-08-08 NOTE — ED Notes (Signed)
Pt requesting his "nose drops". Pt takes phenylephrine at night. md notified.

## 2019-08-08 NOTE — Progress Notes (Signed)
Patient ID: Adam [name], male   DOB: 21-Jan-1951, 69 y.o.   MRN: [id number] [facility] PROGRESS NOTE  Adam [name] AYT:[id number] DOB: Jun 28, 1950 DOA: 08/07/2019 PCP: [name], MD  HPI/Subjective: Patient developed pain and had blood from his penis tripping like a faucet.  Had recent urological procedure for varicocele.  Did have some nausea vomiting on Thursday and had lower abdominal pain on the left.  Yesterday started having pain in his right eye.  Objective: Vitals:   08/08/19 0941 08/08/19 1612  BP: (!) 147/91 120/64  Pulse: 83 79  Resp: 16 18  Temp: 97.7 F (36.5 C) 98.5 F (36.9 C)  SpO2: 97% 99%    Intake/Output Summary (Last 24 hours) at 08/08/2019 1634 Last data filed at 08/08/2019 1625 Gross per 24 hour  Intake 947.14 ml  Output 605 ml  Net 342.14 ml   Filed Weights   08/07/19 2006  Weight: 118.8 kg    ROS: Review of Systems  Constitutional: Negative for fever.  Eyes: Positive for blurred vision, photophobia and pain.  Respiratory: Negative for cough and shortness of breath.   Cardiovascular: Negative for chest pain.  Gastrointestinal: Positive for abdominal pain and nausea. Negative for constipation, diarrhea and vomiting.  Genitourinary: Negative for dysuria.  Musculoskeletal: Negative for joint pain.  Neurological: Negative for dizziness.   Exam: Physical Exam  HENT:  Nose: No mucosal edema.  Mouth/Throat: No oropharyngeal exudate.  Eyes: Pupils are equal, round, and reactive to light. Lids are normal. Right conjunctiva is injected. Left conjunctiva is not injected.  Vision right eye 20/400 but he did not have his reading glasses on.  Pain to palpation.  Light bothers his eyes.  Neck: Carotid bruit is not present.  Cardiovascular: Normal rate, S1 normal and S2 normal. Exam reveals no gallop.  No murmur heard. Respiratory: No respiratory distress. He has no wheezes. He has no rhonchi. He has no rales.  GI: Soft. Bowel sounds are  normal. There is no abdominal tenderness.  Musculoskeletal:     Right lower leg: Swelling present.     Left lower leg: Swelling present.  Lymphadenopathy:    He has no cervical adenopathy.  Neurological: He is alert. No cranial nerve deficit.  Skin: Skin is warm. No rash noted. Nails show no clubbing.  Psychiatric: Mood normal.      Data Reviewed: Basic Metabolic Panel: Recent Labs  Lab 08/07/19 2008 08/08/19 0944  NA 135  --   K 4.1  --   CL 102  --   CO2 23  --   GLUCOSE 184*  --   BUN 29*  --   CREATININE 1.89* 1.94*  CALCIUM 9.2  --    Liver Function Tests: Recent Labs  Lab 08/07/19 2008  AST 24  ALT 19  ALKPHOS 67  BILITOT 0.8  PROT 8.9*  ALBUMIN 3.2*   Recent Labs  Lab 08/07/19 2008  LIPASE 25   CBC: Recent Labs  Lab 08/07/19 2008  WBC 8.9  HGB 10.9*  HCT 32.7*  MCV 84.9  PLT 267    CBG: Recent Labs  Lab 08/02/19 0623 08/02/19 0909 08/08/19 0713 08/08/19 1222  GLUCAP 283* 238* 134* 215*    Recent Results (from the past 240 hour(s))  SARS Coronavirus 2 by RT PCR (hospital order, performed in [facility] hospital lab) Nasopharyngeal Nasopharyngeal Swab     Status: None   Collection Time: 08/08/19  2:44 AM   Specimen: Nasopharyngeal Swab  Result Value Ref  Range Status   SARS Coronavirus 2 NEGATIVE NEGATIVE Final    Comment: (NOTE) SARS-CoV-2 target nucleic acids are NOT DETECTED.  The SARS-CoV-2 RNA is generally detectable in upper and lower respiratory specimens during the acute phase of infection. The lowest concentration of SARS-CoV-2 viral copies this assay can detect is 250 copies / mL. A negative result does not preclude SARS-CoV-2 infection and should not be used as the sole basis for treatment or other patient management decisions.  A negative result may occur with improper specimen collection / handling, submission of specimen other than nasopharyngeal swab, presence of viral mutation(s) within the areas targeted by this  assay, and inadequate number of viral copies (<250 copies / mL). A negative result must be combined with clinical observations, patient history, and epidemiological information.  Fact Sheet for Patients:   StrictlyIdeas.no  Fact Sheet for Healthcare Providers: BankingDealers.co.za  This test is not yet approved or  cleared by the [place] FDA and has been authorized for detection and/or diagnosis of SARS-CoV-2 by FDA under an Emergency Use Authorization (EUA).  This EUA will remain in effect (meaning this test can be used) for the duration of the COVID-19 declaration under Section 564(b)(1) of the Act, 21 U.S.C. section 360bbb-3(b)(1), unless the authorization is terminated or revoked sooner.  Performed at [facility], [address] [place], [place] [address]   Aerobic/Anaerobic Culture (surgical/deep wound)     Status: None (Preliminary result)   Collection Time: 08/08/19  6:39 AM   Specimen: PATH Other; Tissue  Result Value Ref Range Status   Specimen Description   Final    ABSCESS SCROTAL Performed at [facility], [address] [place], [place] [address]    Special Requests   Final    NONE Performed at [facility], [address] [place], [place] [address]    Gram Stain   Final    RARE WBC PRESENT, PREDOMINANTLY PMN RARE GRAM POSITIVE COCCI Performed at [facility], [place] [address] [place], [place] [address]    Culture PENDING  Incomplete   Report Status PENDING  Incomplete     Studies: CT ABDOMEN PELVIS W CONTRAST  Result Date: 08/08/2019 CLINICAL DATA:  Left lower quadrant pain. Scrotal pain. Limb drainage from scrotal surgical site. Recent hydrocele repair. EXAM: CT ABDOMEN AND PELVIS WITH CONTRAST TECHNIQUE: Multidetector CT imaging of the abdomen and pelvis was performed using the standard protocol following bolus administration of intravenous contrast. CONTRAST:  148mL  OMNIPAQUE IOHEXOL 300 MG/ML  SOLN COMPARISON:  None. FINDINGS: Lower chest: The lung bases are clear. The heart is enlarged. Hepatobiliary: The liver is normal. Normal gallbladder.There is no biliary ductal dilation. Pancreas: Normal contours without ductal dilatation. No peripancreatic fluid collection. Spleen: Unremarkable. Adrenals/Urinary Tract: --Adrenal glands: Unremarkable. --Right kidney/ureter: There is a 3.6 cm complex solid-appearing mass arising from the upper pole the right kidney. This is concerning for renal cell carcinoma until proven otherwise. There are additional small cysts. --Left kidney/ureter: There is a slightly complex cyst arising from the lower pole the left kidney measuring approximately 5.3 cm. --Urinary bladder: Unremarkable. Stomach/Bowel: --Stomach/Duodenum: No hiatal hernia or other gastric abnormality. Normal duodenal course and caliber. --Small bowel: Unremarkable. --Colon: Rectosigmoid diverticulosis without acute inflammation. --Appendix: Normal. Vascular/Lymphatic: Atherosclerotic calcification is present within the non-aneurysmal abdominal aorta, without hemodynamically significant stenosis. --No retroperitoneal lymphadenopathy. --No mesenteric lymphadenopathy. --No pelvic or inguinal lymphadenopathy. Reproductive: Prostate gland is enlarged. There is extensive scrotal wall thickening, primarily involving the left hemiscrotum.  There is a complex collection in the left hemiscrotum with pockets of gas. There is a surgical incision along the inferior aspect of the left hemiscrotum that contains both pockets of gas and fluid. Other: No ascites or free air. There is a fat containing umbilical hernia. Musculoskeletal. 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 There is no displaced fracture. There is partial fusion of the bilateral sacroiliac joints. IMPRESSION: 1. Extensive scrotal wall thickening, primarily involving the left hemiscrotum. There is a complex  collection in the left hemiscrotum with pockets of gas and fluid. Findings are concerning for a postoperative abscess or pyocele. 2. There is a 3.6 cm complex solid-appearing mass arising from the upper pole the right kidney. This is concerning for renal cell carcinoma until proven otherwise. Follow-up with an outpatient contrast enhanced MRI is recommended for further evaluation of this finding. 3. Rectosigmoid diverticulosis without acute inflammation. Aortic Atherosclerosis (ICD10-I70.0). Electronically Signed   By: Constance Holster M.D.   On: 08/08/2019 01:18    Scheduled Meds: . atorvastatin  80 mg Oral QHS  . brimonidine-timolol  1 drop Both Eyes BID  . enoxaparin (LOVENOX) injection  40 mg Subcutaneous Q24H  . insulin aspart  0-15 Units Subcutaneous TID WC  . insulin aspart  0-5 Units Subcutaneous QHS  . insulin aspart protamine- aspart  25 Units Subcutaneous BID WC  . neomycin-polymyxin b-dexamethasone   Right Eye TID   Continuous Infusions: . piperacillin-tazobactam (ZOSYN)  IV 12.5 mL/hr at 08/08/19 1300  . vancomycin      Assessment/Plan:  1. Postoperative complication with scrotal abscess.  Patient taken to the operating room this morning.  Continue vancomycin and Zosyn. 2. Right eye pain and blurred vision.  Case discussed with Dr. Edison Pace ophthalmology and appreciate his consultation.  Eyedrops given. 3. Acute kidney injury on chronic kidney disease stage IIIa.  Holding antihypertensive medications and medications that can affect the kidney function.  Gentle fluids. 4. Glaucoma continue Combigan. 5. Type 2 diabetes mellitus with hyperlipidemia on sliding scale and atorvastatin. 6. Renal mass.  Follow-up with urology as outpatient.    Code Status:     Code Status Orders  (From admission, onward)         Start     Ordered   08/08/19 0426  Full code  Continuous        08/08/19 0427        Code Status History    Date Active Date Inactive Code Status Order ID  Comments User Context   02/19/2017 1654 02/21/2017 1810 Full Code [id number]  [name], MD Inpatient   Advance Care Planning Activity     Family Communication: Daughter at the bedside Disposition Plan: Status is: Observation   Dispo: The patient is from: Home              Anticipated d/c is to: Home              Anticipated d/c date is: Dependent on when cleared by urology to get out of the hospital              Patient currently receiving IV antibiotics for postoperative infection  Consultants:  Urology  Procedures:  Urological procedure  Antibiotics:  Vancomycin  Zosyn  Time spent: 35 minutes  [place]

## 2019-08-08 NOTE — Anesthesia Postprocedure Evaluation (Signed)
Anesthesia Post Note  Patient: Adam [name]  Procedure(s) Performed: INCISION AND DRAINAGE ABSCESS (N/A )  Patient location during evaluation: PACU Anesthesia Type: General Level of consciousness: awake and alert Pain management: pain level controlled Vital Signs Assessment: post-procedure vital signs reviewed and stable Respiratory status: spontaneous breathing, nonlabored ventilation and respiratory function stable Cardiovascular status: blood pressure returned to baseline and stable Postop Assessment: no apparent nausea or vomiting Anesthetic complications: no   No complications documented.   Last Vitals:  Vitals:   08/08/19 0803 08/08/19 0833  BP: (!) 163/87 (!) 160/84  Pulse: 85 74  Resp: 16   Temp: 37 C 36.5 C  SpO2: 97% 98%    Last Pain:  Vitals:   08/08/19 0833  TempSrc: Axillary  PainSc:                  Tera Mater

## 2019-08-08 NOTE — Anesthesia Preprocedure Evaluation (Signed)
Anesthesia Evaluation  Patient identified by MRN, date of birth, ID band Patient awake    Reviewed: Allergy & Precautions, H&P , NPO status , Patient's Chart, lab work & pertinent test results, reviewed documented beta blocker date and time   Airway Mallampati: II  TM Distance: >3 FB Neck ROM: full    Dental  (+) Teeth Intact   Pulmonary sleep apnea and Continuous Positive Airway Pressure Ventilation , former smoker,    Pulmonary exam normal        Cardiovascular Exercise Tolerance: Poor hypertension, negative cardio ROS Normal cardiovascular exam Rate:Normal     Neuro/Psych negative neurological ROS  negative psych ROS   GI/Hepatic negative GI ROS, Neg liver ROS,   Endo/Other  negative endocrine ROSdiabetes, Poorly Controlled, Type 2, Oral Hypoglycemic Agents  Renal/GU Renal disease  negative genitourinary   Musculoskeletal   Abdominal   Peds  Hematology negative hematology ROS (+)   Anesthesia Other Findings   Reproductive/Obstetrics negative OB ROS                             Anesthesia Physical Anesthesia Plan  ASA: III and emergent  Anesthesia Plan: General LMA   Post-op Pain Management:    Induction:   PONV Risk Score and Plan:   Airway Management Planned:   Additional Equipment:   Intra-op Plan:   Post-operative Plan:   Informed Consent: I have reviewed the patients History and Physical, chart, labs and discussed the procedure including the risks, benefits and alternatives for the proposed anesthesia with the patient or authorized representative who has indicated his/her understanding and acceptance.       Plan Discussed with: CRNA  Anesthesia Plan Comments:         Anesthesia Quick Evaluation

## 2019-08-08 NOTE — Progress Notes (Signed)
Pharmacy Antibiotic Note  Adam [name] is a 69 y.o. male admitted on 08/07/2019 with wound infection.  Pharmacy has been consulted for Vancomycin and Zosyn dosing.  Plan: Zosyn 3.375g IV q8h (4 hour infusion).   Vancomycin 1750 mg IV Q 36 hrs. Goal AUC 400-550. Expected AUC: 537.4, Css min 11.6 SCr used: 1.89  Height: 5\' 11"  (180.3 cm) Weight: 118.8 kg (262 lb) IBW/kg (Calculated) : 75.3  Temp (24hrs), Avg:98.6 F (37 C), Min:98.4 F (36.9 C), Max:98.8 F (37.1 C)  Recent Labs  Lab 08/07/19 2008  WBC 8.9  CREATININE 1.89*    Estimated Creatinine Clearance: 47.7 mL/min (A) (by C-G formula based on SCr of 1.89 mg/dL (H)).    Allergies  Allergen Reactions  . Ibuprofen Swelling    LEGS  . Cephalexin Rash    Antimicrobials this admission:   >>    >>   Dose adjustments this admission:   Microbiology results:  BCx:   UCx:    Sputum:    MRSA PCR:   Thank you for allowing pharmacy to be a part of this patient's care.  Hart Robinsons A 08/08/2019 5:18 AM

## 2019-08-08 NOTE — Anesthesia Procedure Notes (Signed)
Procedure Name: Intubation Date/Time: 08/08/2019 6:21 AM Performed by: Esaw Grandchild, CRNA Pre-anesthesia Checklist: Patient identified, Emergency Drugs available, Suction available and Patient being monitored Patient Re-evaluated:Patient Re-evaluated prior to induction Oxygen Delivery Method: Circle system utilized Preoxygenation: Pre-oxygenation with 100% oxygen Induction Type: IV induction, Rapid sequence and Cricoid Pressure applied Ventilation: Mask ventilation without difficulty Laryngoscope Size: Miller and 2 Grade View: Grade I Tube type: Oral Tube size: 8.0 mm Number of attempts: 1 Airway Equipment and Method: Stylet and Oral airway Placement Confirmation: ETT inserted through vocal cords under direct vision,  positive ETCO2 and breath sounds checked- equal and bilateral Secured at: 23 cm Tube secured with: Tape Dental Injury: Teeth and Oropharynx as per pre-operative assessment

## 2019-08-08 NOTE — ED Notes (Signed)
Pt to ct 

## 2019-08-08 NOTE — ED Notes (Signed)
Report jenna, rn.

## 2019-08-08 NOTE — Progress Notes (Signed)
Pharmacy Antibiotic Note  Adam [name] is a 69 y.o. male admitted on 08/07/2019 with wound infection.  Pharmacy has been consulted for Vancomycin and Zosyn dosing.  Plan: Renal function worsening. Change to vanc 1250 mg IV q24h to start tonight as patient did not receive a full load.  Continue Zosyn 3.375 g IV q8h (extended infusion)  Height: 5\' 11"  (180.3 cm) Weight: 118.8 kg (262 lb) IBW/kg (Calculated) : 75.3  Temp (24hrs), Avg:98 F (36.7 C), Min:97.4 F (36.3 C), Max:98.8 F (37.1 C)  Recent Labs  Lab 08/07/19 2008 08/08/19 0944  WBC 8.9  --   CREATININE 1.89* 1.94*    Estimated Creatinine Clearance: 46.5 mL/min (A) (by C-G formula based on SCr of 1.94 mg/dL (H)).    Allergies  Allergen Reactions  . Ibuprofen Swelling    LEGS  . Cephalexin Rash    Antimicrobials this admission: Vanc 6/13 >> Zosyn 6/13 >>   Microbiology results: 6/14 Abscess (scrotal): GPC on gram stain  Thank you for allowing pharmacy to be a part of this patient's care.  Tawnya Crook, PharmD 08/08/2019 3:49 PM

## 2019-08-08 NOTE — Transfer of Care (Signed)
Immediate Anesthesia Transfer of Care Note  Patient: Adam [name]  Procedure(s) Performed: INCISION AND DRAINAGE ABSCESS (N/A )  Patient Location: PACU  Anesthesia Type:General  Level of Consciousness: drowsy  Airway & Oxygen Therapy: Patient Spontanous Breathing and Patient connected to face mask oxygen  Post-op Assessment: Report given to RN and Post -op Vital signs reviewed and stable  Post vital signs: Reviewed and stable  Last Vitals:  Vitals Value Taken Time  BP 130/83 08/08/19 0705  Temp    Pulse 87 08/08/19 0708  Resp 26 08/08/19 0708  SpO2 100 % 08/08/19 0708  Vitals shown include unvalidated device data.  Last Pain:  Vitals:   08/08/19 0705  TempSrc:   PainSc: (P) Asleep         Complications: No complications documented.

## 2019-08-08 NOTE — Progress Notes (Addendum)
   08/08/19 1400  Clinical Encounter Type  Visited With Patient and family together  Visit Type Initial  Referral From Chaplain  Consult/Referral To Chaplain  While rounding, patient looked in the room and saw daughter sitting by the window. When chaplain entered the room, chaplain saw another daughter sitting by the window. The daughters names are Stanton Kidney and [name]. Chaplain asked how they were doing and they said fine. Chaplain said it looked like their father was in a deep sleep. One daughter said he had surgery this morning and anesthesia is still working. Chaplain told them that she will check in on them tomorrow.
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
## 2019-08-08 NOTE — Op Note (Signed)
Preoperative diagnosis:  Scrotal abscess  Postoperative diagnosis:  Superficial scrotal abscess-small  Procedure: Scrotal exploration Incision and drainage left superficial scrotal abscess  Surgeon: [name], MD  Anesthesia: General  Complications: None  Intraoperative findings:  1.  Small collection of purulent fluid superficial to dartos ~ 5 cc 2.  Serosanguineous intrascrotal fluid  EBL: Minimal  Specimens: None  Indication: Adam [name] is a 69 y.o. male who is s/p left hydrocelectomy 08/02/2019 for a large left hydrocele.  He was seen in the office on 6/11 for drain removal and incision showed no purulence, erythema or drainage.  Yesterday he developed some increased scrotal pain, bleeding from the incision.  No fever, chills or leukocytosis.  Was noted to have purulent drainage from incision in the ED.  CT abdomen pelvis with intrascrotal fluid collection.  After reviewing the management options for treatment, he elected to proceed with the above surgical procedure(s). We have discussed the potential benefits and risks of the procedure, side effects of the proposed treatment, the likelihood of the patient achieving the goals of the procedure, and any potential problems that might occur during the procedure or recuperation. Informed consent has been obtained.  Description of procedure:  The patient was taken to the operating room and general anesthesia was induced.  The patient was placed in the supine position, prepped and draped in the usual sterile fashion, and preoperative antibiotics were administered. A preoperative time-out was performed.   Small amount of purulent drainage was noted at the inferior aspect of his midline incision.  A hemostat was inserted to this area and the incision opened for approximately 3 cm.  Approximately 5 cc of pus was obtained.  Culture swabs were performed.  The dartos fascia was intact.  The incision was opened further and the underlying  tissue was viable and not necrotic.  The dartos was opened at the inferior aspect of the incision and a finger was inserted.  The testis was normal in appearance.  Serosanguineous fluid was noted intrascrotal length and was suctioned.  This fluid was not purulent.  The scrotal contents were copiously irrigated with sterile saline.  1/4 inch Penrose drain was placed through a separate stab incision in the dependent portion of the scrotum and secured with 3-0 nylon.  A second half inch Penrose was inserted and brought out through the anterosuperior aspect of the incision and secured with 3-0 nylon.  The dartos fascia was loosely approximated with interrupted 3-0 Monocryl to the drain exit.  The skin was loosely approximated with interrupted 3-0 Monocryl suture with the exception of the anterior aspect for a length of approximately 3 cm.  A dressing of fluffs, ABD and mesh underwear was applied.  After anesthetic reversal he was transported to the PACU in stable condition.  [name], M.D.

## 2019-08-08 NOTE — ED Notes (Signed)
176/114 BLOOD PRESSURE ENTERED IN ERROR.

## 2019-08-09 ENCOUNTER — Encounter: Payer: Self-pay | Admitting: Urology

## 2019-08-09 DIAGNOSIS — K219 Gastro-esophageal reflux disease without esophagitis: Secondary | ICD-10-CM | POA: Diagnosis present

## 2019-08-09 DIAGNOSIS — Z808 Family history of malignant neoplasm of other organs or systems: Secondary | ICD-10-CM | POA: Diagnosis not present

## 2019-08-09 DIAGNOSIS — N179 Acute kidney failure, unspecified: Secondary | ICD-10-CM | POA: Diagnosis present

## 2019-08-09 DIAGNOSIS — N400 Enlarged prostate without lower urinary tract symptoms: Secondary | ICD-10-CM | POA: Diagnosis present

## 2019-08-09 DIAGNOSIS — N492 Inflammatory disorders of scrotum: Secondary | ICD-10-CM | POA: Diagnosis present

## 2019-08-09 DIAGNOSIS — I5032 Chronic diastolic (congestive) heart failure: Secondary | ICD-10-CM | POA: Diagnosis present

## 2019-08-09 DIAGNOSIS — T8140XA Infection following a procedure, unspecified, initial encounter: Secondary | ICD-10-CM | POA: Diagnosis present

## 2019-08-09 DIAGNOSIS — E1169 Type 2 diabetes mellitus with other specified complication: Secondary | ICD-10-CM | POA: Diagnosis present

## 2019-08-09 DIAGNOSIS — Z794 Long term (current) use of insulin: Secondary | ICD-10-CM | POA: Diagnosis not present

## 2019-08-09 DIAGNOSIS — Z833 Family history of diabetes mellitus: Secondary | ICD-10-CM | POA: Diagnosis not present

## 2019-08-09 DIAGNOSIS — M19072 Primary osteoarthritis, left ankle and foot: Secondary | ICD-10-CM | POA: Diagnosis present

## 2019-08-09 DIAGNOSIS — Z87891 Personal history of nicotine dependence: Secondary | ICD-10-CM | POA: Diagnosis not present

## 2019-08-09 DIAGNOSIS — K5730 Diverticulosis of large intestine without perforation or abscess without bleeding: Secondary | ICD-10-CM | POA: Diagnosis present

## 2019-08-09 DIAGNOSIS — Z20822 Contact with and (suspected) exposure to covid-19: Secondary | ICD-10-CM | POA: Diagnosis present

## 2019-08-09 DIAGNOSIS — N2889 Other specified disorders of kidney and ureter: Secondary | ICD-10-CM | POA: Diagnosis present

## 2019-08-09 DIAGNOSIS — E785 Hyperlipidemia, unspecified: Secondary | ICD-10-CM | POA: Diagnosis present

## 2019-08-09 DIAGNOSIS — Z79899 Other long term (current) drug therapy: Secondary | ICD-10-CM | POA: Diagnosis not present

## 2019-08-09 DIAGNOSIS — Z818 Family history of other mental and behavioral disorders: Secondary | ICD-10-CM | POA: Diagnosis not present

## 2019-08-09 DIAGNOSIS — H5711 Ocular pain, right eye: Secondary | ICD-10-CM | POA: Diagnosis not present

## 2019-08-09 DIAGNOSIS — E1122 Type 2 diabetes mellitus with diabetic chronic kidney disease: Secondary | ICD-10-CM | POA: Diagnosis present

## 2019-08-09 DIAGNOSIS — N1832 Chronic kidney disease, stage 3b: Secondary | ICD-10-CM | POA: Diagnosis present

## 2019-08-09 DIAGNOSIS — E113519 Type 2 diabetes mellitus with proliferative diabetic retinopathy with macular edema, unspecified eye: Secondary | ICD-10-CM | POA: Diagnosis present

## 2019-08-09 DIAGNOSIS — I130 Hypertensive heart and chronic kidney disease with heart failure and stage 1 through stage 4 chronic kidney disease, or unspecified chronic kidney disease: Secondary | ICD-10-CM | POA: Diagnosis present

## 2019-08-09 DIAGNOSIS — Z7982 Long term (current) use of aspirin: Secondary | ICD-10-CM | POA: Diagnosis not present

## 2019-08-09 DIAGNOSIS — Z8249 Family history of ischemic heart disease and other diseases of the circulatory system: Secondary | ICD-10-CM | POA: Diagnosis not present

## 2019-08-09 DIAGNOSIS — G4730 Sleep apnea, unspecified: Secondary | ICD-10-CM | POA: Diagnosis present

## 2019-08-09 LAB — GLUCOSE, CAPILLARY
Glucose-Capillary: 132 mg/dL — ABNORMAL HIGH (ref 70–99)
Glucose-Capillary: 109 mg/dL — ABNORMAL HIGH (ref 70–99)
Glucose-Capillary: 109 mg/dL — ABNORMAL HIGH (ref 70–99)
Glucose-Capillary: 109 mg/dL — ABNORMAL HIGH (ref 70–99)

## 2019-08-09 LAB — CBC
HCT: 29.1 % — ABNORMAL LOW (ref 39.0–52.0)
Hemoglobin: 9.5 g/dL — ABNORMAL LOW (ref 13.0–17.0)
MCH: 28 pg (ref 26.0–34.0)
MCHC: 32.6 g/dL (ref 30.0–36.0)
MCV: 85.8 fL (ref 80.0–100.0)
Platelets: 251 10*3/uL (ref 150–400)
RBC: 3.39 MIL/uL — ABNORMAL LOW (ref 4.22–5.81)
RDW: 14.4 % (ref 11.5–15.5)
WBC: 9.5 10*3/uL (ref 4.0–10.5)
nRBC: 0 % (ref 0.0–0.2)

## 2019-08-09 LAB — BASIC METABOLIC PANEL
Anion gap: 9 (ref 5–15)
BUN: 33 mg/dL — ABNORMAL HIGH (ref 8–23)
CO2: 25 mmol/L (ref 22–32)
Calcium: 8.4 mg/dL — ABNORMAL LOW (ref 8.9–10.3)
Chloride: 101 mmol/L (ref 98–111)
Creatinine, Ser: 2.14 mg/dL — ABNORMAL HIGH (ref 0.61–1.24)
GFR calc Af Amer: 35 mL/min — ABNORMAL LOW (ref >60)
GFR calc non Af Amer: 30 mL/min — ABNORMAL LOW (ref >60)
Glucose, Bld: 132 mg/dL — ABNORMAL HIGH (ref 70–99)
Potassium: 4.2 mmol/L (ref 3.5–5.1)
Sodium: 135 mmol/L (ref 135–145)

## 2019-08-09 NOTE — Evaluation (Signed)
Physical Therapy Evaluation Patient Details Name: Adam [name] MRN: [id number] DOB: [birth year] Today's Date: 08/09/2019   History of Present Illness  Per MD notes: Pt is a 69 y.o. male with medical history significant for diabetes and hypertension who recently underwent hydrocelectomy on 08/02/2019 for a large left hydrocele with follow-up on 08/05/2019 for drain removal who presents to the emergency room with a complaint of oozing from the surgical site associated with pain to the left scrotum.  MD assessment includes: Postoperative complication with scrotal abscess s/p debridement, AKI, glaucoma, DM II, and renal mass to be followed as outpatient.    Clinical Impression  Pt pleasant and motivated to participate during the session.  Pt and daughter reported that since the pt's initial surgery on the 8th of June that he has required extra time and effort and occasional min A with functional mobility but has not had any falls.  During the session the pt did require extra time/effort with bed mobility and transfers but no physical assistance.  Pt was able to amb 200' with a RW once up in standing and presented with good stability although cadence was slow with short B step length.  Pt will benefit from HHPT upon discharge to safely address deficits listed in patient problem list for decreased caregiver assistance, decreased risk of further functional decline, and eventual return to PLOF.       Follow Up Recommendations Home health PT;Supervision - Intermittent    Equipment Recommendations  None recommended by PT    Recommendations for Other Services       Precautions / Restrictions Precautions Precautions: None Restrictions Weight Bearing Restrictions: No      Mobility  Bed Mobility Overal bed mobility: Modified Independent             General bed mobility comments: Extra time and effort and use of rails  Transfers Overall transfer level: Needs assistance Equipment used:  Rolling walker (2 wheeled) Transfers: Sit to/from Stand Sit to Stand: Min guard         General transfer comment: Fair to good eccentric and concentric control with min verbal cues for sequencing most notably for hand placement and increased trunk flexion  Ambulation/Gait Ambulation/Gait assistance: Supervision;Min guard Gait Distance (Feet): 200 Feet Assistive device: Rolling walker (2 wheeled) Gait Pattern/deviations: Step-through pattern;Decreased step length - right;Decreased step length - left;Trunk flexed Gait velocity: decreased   General Gait Details: slow cadence but steady without LOB and with no adverse symptoms noted  Stairs            Wheelchair Mobility    Modified Rankin (Stroke Patients Only)       Balance Overall balance assessment: Needs assistance Sitting-balance support: Bilateral upper extremity supported Sitting balance-Leahy Scale: Good     Standing balance support: Bilateral upper extremity supported Standing balance-Leahy Scale: Good                               Pertinent Vitals/Pain Pain Assessment: No/denies pain    Home Living Family/patient expects to be discharged to:: Private residence Living Arrangements: Spouse/significant other;Children Available Help at Discharge: Family;Available 24 hours/day Type of Home: House Home Access: Stairs to enter Entrance Stairs-Rails: Left Entrance Stairs-Number of Steps: 3 Home Layout: One level Home Equipment: Walker - 2 wheels;Cane - single point      Prior Function Level of Independence: Independent         Comments: Ind amb community distances  without an AD, no fall history, Ind with ADLs     Hand Dominance        Extremity/Trunk Assessment   Upper Extremity Assessment Upper Extremity Assessment: Generalized weakness    Lower Extremity Assessment Lower Extremity Assessment: Generalized weakness       Communication   Communication: No difficulties   Cognition Arousal/Alertness: Awake/alert Behavior During Therapy: WFL for tasks assessed/performed Overall Cognitive Status: Within Functional Limits for tasks assessed                                        General Comments      Exercises Total Joint Exercises Ankle Circles/Pumps: AROM;Strengthening;Both;10 reps Quad Sets: Strengthening;Both;10 reps Gluteal Sets: Strengthening;Both;10 reps Towel Squeeze: Strengthening;Both;10 reps Hip ABduction/ADduction: Strengthening;Both;10 reps Long Arc Quad: Strengthening;Both;10 reps Knee Flexion: Strengthening;Both;10 reps Marching in Standing: AROM;Both;5 reps;Standing Other Exercises Other Exercises: HEP education for BLE APs, GS, QS, and LAQs x 10 each every 1-2 hours daily   Assessment/Plan    PT Assessment Patient needs continued PT services  PT Problem List Decreased strength;Decreased activity tolerance;Decreased mobility;Decreased knowledge of use of DME       PT Treatment Interventions DME instruction;Gait training;Stair training;Functional mobility training;Therapeutic activities;Therapeutic exercise;Balance training;Patient/family education    PT Goals (Current goals can be found in the Care Plan section)  Acute Rehab PT Goals Patient Stated Goal: To get stronger PT Goal Formulation: With patient Time For Goal Achievement: 08/22/19 Potential to Achieve Goals: Good    Frequency Min 2X/week   Barriers to discharge        Co-evaluation               AM-PAC PT "6 Clicks" Mobility  Outcome Measure Help needed turning from your back to your side while in a flat bed without using bedrails?: A Little Help needed moving from lying on your back to sitting on the side of a flat bed without using bedrails?: A Little Help needed moving to and from a bed to a chair (including a wheelchair)?: A Little Help needed standing up from a chair using your arms (e.g., wheelchair or bedside chair)?: A Little Help  needed to walk in hospital room?: A Little Help needed climbing 3-5 steps with a railing? : A Little 6 Click Score: 18    End of Session Equipment Utilized During Treatment: Gait belt Activity Tolerance: Patient tolerated treatment well Patient left: in bed;with bed alarm set;with family/visitor present;with call bell/phone within reach Nurse Communication: Mobility status PT Visit Diagnosis: Difficulty in walking, not elsewhere classified (R26.2);Muscle weakness (generalized) (M62.81)    Time: 4944-9675 PT Time Calculation (min) (ACUTE ONLY): 29 min   Charges:   PT Evaluation $PT Eval Moderate Complexity: 1 Mod PT Treatments $Therapeutic Exercise: 8-22 mins       D. Royetta Asal PT, DPT 08/09/19, 2:45 PM

## 2019-08-09 NOTE — Progress Notes (Signed)
Inpatient Diabetes Program Recommendations  AACE/ADA: New Consensus Statement on Inpatient Glycemic Control   Target Ranges:  Prepandial:   less than 140 mg/dL      Peak postprandial:   less than 180 mg/dL (1-2 hours)      Critically ill patients:  140 - 180 mg/dL   Results for ELIC, VENCILL (MRN [id number]) as of 08/09/2019 14:16  Ref. Range 08/08/2019 07:13 08/08/2019 12:22 08/08/2019 16:45 08/08/2019 21:04 08/09/2019 07:38 08/09/2019 12:04  Glucose-Capillary Latest Ref Range: 70 - 99 mg/dL 134 (H) 215 (H) 142 (H) 197 (H) 132 (H) 109 (H)   Review of Glycemic Control  Diabetes history: DM2 Outpatient Diabetes medications: Humalog 75/25 50 units BID, Metformin 1000 mg BID, Januvia 100 mg daily Current orders for Inpatient glycemic control: 70/30 25 units BID, Novolog 0-15 units TID with meals, Novolog 0-5 units QHS  Inpatient Diabetes Program Recommendations:   HgbA1C: A1C 9.7% on 08/08/19 indicating an average glucose of 232 mg/dl over the past 2-3 months.  NOTE: Spoke with patient over the phone about diabetes and home regimen for diabetes control. Patient reports being followed by PCP for diabetes management and currently taking Humalog 75/25 50 units BID, Metformin 1000 mg BID, Januvia 100 mg daily for diabetes control. Patient reports taking DM medications as prescribed. Patient reports checking glucose 1 time per day in the morning and reports glucose is usually 85-160 mg/dl.  Patient reports last A1C was 7.5% in October 2020.  Discussed A1C results (9.7% on 08/08/19 ) and explained that current A1C indicates an average glucose of 232 mg/dl over the past 2-3 months. Discussed glucose and A1C goals. Discussed importance of checking CBGs and maintaining good CBG control to prevent long-term and short-term complications.  Stressed to the patient the importance of improving glycemic control to prevent further complications from uncontrolled diabetes. Encouraged patient to check glucose at least 2  times per day and to follow up with PCP. Patient reports that PCP wants to see him about one week after he is discharged from the hospital. Encouraged patient to reach out to PCP for adjustments with insulin if glucose is consistently elevated over 200 in the afternoon as his insulin dosages may need to be increased if glucose is running higher in the afternoon.  Patient verbalized understanding of information discussed and reports no further questions at this time related to diabetes.  Thanks, Barnie Alderman, RN, MSN, CDE Diabetes Coordinator Inpatient Diabetes Program [phone] (Team Pager)

## 2019-08-09 NOTE — Progress Notes (Signed)
Patient ID: Adam [name], male   DOB: 03-22-1950, 69 y.o.   MRN: [id number] [facility] PROGRESS NOTE  Adam [name] VCB:[id number] DOB: May 17, 1950 DOA: 08/07/2019 PCP: [name], MD  HPI/Subjective: Patient still having some soreness in his scrotum.  Still having some blurred vision and some eye pain.  Tolerating diet.  Objective: Vitals:   08/09/19 0353 08/09/19 1207  BP: 138/72 112/82  Pulse: 77 85  Resp: 16   Temp: 98.7 F (37.1 C) 98.3 F (36.8 C)  SpO2: 99% 99%    Intake/Output Summary (Last 24 hours) at 08/09/2019 1422 Last data filed at 08/09/2019 1300 Gross per 24 hour  Intake 840 ml  Output 1075 ml  Net -235 ml   Filed Weights   08/07/19 2006  Weight: 118.8 kg    ROS: Review of Systems  Constitutional: Negative for fever.  Eyes: Positive for blurred vision and pain. Negative for photophobia.  Respiratory: Negative for cough and shortness of breath.   Cardiovascular: Negative for chest pain.  Gastrointestinal: Negative for abdominal pain, nausea and vomiting.  Genitourinary: Negative for dysuria.  Musculoskeletal: Negative for joint pain.  Neurological: Negative for dizziness.   Exam: Physical Exam  HENT:  Nose: No mucosal edema.  Mouth/Throat: No oropharyngeal exudate.  Eyes: Pupils are equal, round, and reactive to light. Lids are normal. Right conjunctiva is not injected. Left conjunctiva is not injected.  No pain with light flashing in the eye.  Less pain in eye when palpating  Neck: Carotid bruit is not present.  Cardiovascular: Normal rate, S1 normal and S2 normal. Exam reveals no gallop.  No murmur heard. Respiratory: No respiratory distress. He has no wheezes. He has no rhonchi. He has no rales.  GI: Soft. Bowel sounds are normal. There is no abdominal tenderness.  Genitourinary:    Genitourinary Comments: Scrotal wound covered with dressing.   Musculoskeletal:     Right lower leg: Swelling present.     Left lower leg:  Swelling present.  Neurological: He is alert. No cranial nerve deficit.  Skin: Skin is warm. Nails show no clubbing.  Psychiatric: Mood normal.      Data Reviewed: Basic Metabolic Panel: Recent Labs  Lab 08/07/19 2008 08/08/19 0944 08/09/19 0419  NA 135  --  135  K 4.1  --  4.2  CL 102  --  101  CO2 23  --  25  GLUCOSE 184*  --  132*  BUN 29*  --  33*  CREATININE 1.89* 1.94* 2.14*  CALCIUM 9.2  --  8.4*   Liver Function Tests: Recent Labs  Lab 08/07/19 2008  AST 24  ALT 19  ALKPHOS 67  BILITOT 0.8  PROT 8.9*  ALBUMIN 3.2*   Recent Labs  Lab 08/07/19 2008  LIPASE 25   CBC: Recent Labs  Lab 08/07/19 2008 08/09/19 0419  WBC 8.9 9.5  HGB 10.9* 9.5*  HCT 32.7* 29.1*  MCV 84.9 85.8  PLT 267 251    CBG: Recent Labs  Lab 08/08/19 1222 08/08/19 1645 08/08/19 2104 08/09/19 0738 08/09/19 1204  GLUCAP 215* 142* 197* 132* 109*    Recent Results (from the past 240 hour(s))  SARS Coronavirus 2 by RT PCR (hospital order, performed in [facility] hospital lab) Nasopharyngeal Nasopharyngeal Swab     Status: None   Collection Time: 08/08/19  2:44 AM   Specimen: Nasopharyngeal Swab  Result Value Ref Range Status   SARS Coronavirus 2 NEGATIVE NEGATIVE Final    Comment: (  NOTE) SARS-CoV-2 target nucleic acids are NOT DETECTED.  The SARS-CoV-2 RNA is generally detectable in upper and lower respiratory specimens during the acute phase of infection. The lowest concentration of SARS-CoV-2 viral copies this assay can detect is 250 copies / mL. A negative result does not preclude SARS-CoV-2 infection and should not be used as the sole basis for treatment or other patient management decisions.  A negative result may occur with improper specimen collection / handling, submission of specimen other than nasopharyngeal swab, presence of viral mutation(s) within the areas targeted by this assay, and inadequate number of viral copies (<250 copies / mL). A negative result  must be combined with clinical observations, patient history, and epidemiological information.  Fact Sheet for Patients:   StrictlyIdeas.no  Fact Sheet for Healthcare Providers: BankingDealers.co.za  This test is not yet approved or  cleared by the [place] FDA and has been authorized for detection and/or diagnosis of SARS-CoV-2 by FDA under an Emergency Use Authorization (EUA).  This EUA will remain in effect (meaning this test can be used) for the duration of the COVID-19 declaration under Section 564(b)(1) of the Act, 21 U.S.C. section 360bbb-3(b)(1), unless the authorization is terminated or revoked sooner.  Performed at [facility], [address] [place], [place] [address]   Aerobic/Anaerobic Culture (surgical/deep wound)     Status: None (Preliminary result)   Collection Time: 08/08/19  6:39 AM   Specimen: PATH Other; Tissue  Result Value Ref Range Status   Specimen Description   Final    ABSCESS SCROTAL Performed at [facility], [address], [place], [place] [address]    Special Requests   Final    NONE Performed at [facility], [address] [place], [place] [address]    Gram Stain   Final    RARE WBC PRESENT, PREDOMINANTLY PMN RARE GRAM POSITIVE COCCI    Culture   Final    CULTURE REINCUBATED FOR BETTER GROWTH Performed at [facility], [place] [address] [place], [place] [address]    Report Status PENDING  Incomplete     Studies: CT ABDOMEN PELVIS W CONTRAST  Result Date: 08/08/2019 CLINICAL DATA:  Left lower quadrant pain. Scrotal pain. Limb drainage from scrotal surgical site. Recent hydrocele repair. EXAM: CT ABDOMEN AND PELVIS WITH CONTRAST TECHNIQUE: Multidetector CT imaging of the abdomen and pelvis was performed using the standard protocol following bolus administration of intravenous contrast. CONTRAST:  15mL OMNIPAQUE IOHEXOL 300 MG/ML  SOLN COMPARISON:   None. FINDINGS: Lower chest: The lung bases are clear. The heart is enlarged. Hepatobiliary: The liver is normal. Normal gallbladder.There is no biliary ductal dilation. Pancreas: Normal contours without ductal dilatation. No peripancreatic fluid collection. Spleen: Unremarkable. Adrenals/Urinary Tract: --Adrenal glands: Unremarkable. --Right kidney/ureter: There is a 3.6 cm complex solid-appearing mass arising from the upper pole the right kidney. This is concerning for renal cell carcinoma until proven otherwise. There are additional small cysts. --Left kidney/ureter: There is a slightly complex cyst arising from the lower pole the left kidney measuring approximately 5.3 cm. --Urinary bladder: Unremarkable. Stomach/Bowel: --Stomach/Duodenum: No hiatal hernia or other gastric abnormality. Normal duodenal course and caliber. --Small bowel: Unremarkable. --Colon: Rectosigmoid diverticulosis without acute inflammation. --Appendix: Normal. Vascular/Lymphatic: Atherosclerotic calcification is present within the non-aneurysmal abdominal aorta, without hemodynamically significant stenosis. --No retroperitoneal lymphadenopathy. --No mesenteric lymphadenopathy. --No pelvic or inguinal lymphadenopathy. Reproductive: Prostate gland is enlarged. There is extensive scrotal wall thickening, primarily involving the left hemiscrotum. There is a complex collection  in the left hemiscrotum with pockets of gas. There is a surgical incision along the inferior aspect of the left hemiscrotum that contains both pockets of gas and fluid. Other: No ascites or free air. There is a fat containing umbilical hernia. Musculoskeletal. 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 There is no displaced fracture. There is partial fusion of the bilateral sacroiliac joints. IMPRESSION: 1. Extensive scrotal wall thickening, primarily involving the left hemiscrotum. There is a complex collection in the left hemiscrotum with pockets of gas  and fluid. Findings are concerning for a postoperative abscess or pyocele. 2. There is a 3.6 cm complex solid-appearing mass arising from the upper pole the right kidney. This is concerning for renal cell carcinoma until proven otherwise. Follow-up with an outpatient contrast enhanced MRI is recommended for further evaluation of this finding. 3. Rectosigmoid diverticulosis without acute inflammation. Aortic Atherosclerosis (ICD10-I70.0). Electronically Signed   By: Constance Holster M.D.   On: 08/08/2019 01:18    Scheduled Meds: . atorvastatin  80 mg Oral QHS  . brimonidine  1 drop Both Eyes BID   And  . timolol  1 drop Both Eyes BID  . enoxaparin (LOVENOX) injection  40 mg Subcutaneous Q24H  . insulin aspart  0-15 Units Subcutaneous TID WC  . insulin aspart  0-5 Units Subcutaneous QHS  . insulin aspart protamine- aspart  25 Units Subcutaneous BID WC  . neomycin-polymyxin b-dexamethasone   Right Eye TID   Continuous Infusions: . sodium chloride 50 mL/hr at 08/09/19 0800  . piperacillin-tazobactam (ZOSYN)  IV 3.375 g (08/09/19 0818)  . vancomycin 1,250 mg (08/08/19 2123)    Assessment/Plan:  1. Postoperative complication with scrotal abscess.  Patient taken to the operating room on 08/08/2019 by [name]..  Continue vancomycin and Zosyn. 2. Right eye pain and blurred vision.  Appreciate [name] expertise.  Pain and blurred vision should get better over the next few days.  Eyedrops continued. 3. Acute kidney injury on chronic kidney disease stage IIIa.  Holding antihypertensive medications and medications that can affect kidney function.  Gentle IV fluids. 4. Glaucoma continue Combigan. 5. Type 2 diabetes mellitus with hyperlipidemia on sliding scale and atorvastatin. 6. Right Renal mass.  Follow-up with urology as outpatient.    Code Status:     Code Status Orders  (From admission, onward)         Start     Ordered   08/08/19 0426  Full code  Continuous        08/08/19  0427        Code Status History    Date Active Date Inactive Code Status Order ID Comments User Context   02/19/2017 1654 02/21/2017 1810 Full Code [id number]  [name], MD Inpatient   Advance Care Planning Activity     Family Communication: Daughter on phone Disposition Plan: Status is: Inpatient  Dispo: The patient is from: Home              Anticipated d/c is to: Home              Anticipated d/c date is: Dependent on when cleared by urology to get out of the hospital              Patient currently receiving IV antibiotics for postoperative infection.  Urology recommends continuing IV antibiotics.  Consultants:  Urology  Procedures:  Urological procedure  Antibiotics:  Vancomycin  Zosyn  Time spent: 27 minutes  Kenn[place]

## 2019-08-09 NOTE — Progress Notes (Signed)
Urology Inpatient Progress Note  Subjective: Persistent, though improved scrotal pain since I&D yesterday.  No acute concerns.  Scrotal abscess culture pending with rare gram-positive cocci.  On antibiotics as below.  Anti-infectives: Anti-infectives (From admission, onward)   Start     Dose/Rate Route Frequency Ordered Stop   08/09/19 1800  vancomycin (VANCOREADY) IVPB 1750 mg/350 mL  Status:  Discontinued        1,750 mg 175 mL/hr over 120 Minutes Intravenous Every 36 hours 08/08/19 0453 08/08/19 1547   08/08/19 2200  vancomycin (VANCOREADY) IVPB 1250 mg/250 mL     Discontinue     1,250 mg 166.7 mL/hr over 90 Minutes Intravenous Every 24 hours 08/08/19 1547     08/08/19 0800  piperacillin-tazobactam (ZOSYN) IVPB 3.375 g     Discontinue     3.375 g 12.5 mL/hr over 240 Minutes Intravenous Every 8 hours 08/08/19 0447     08/08/19 0515  vancomycin (VANCOCIN) IVPB 1000 mg/200 mL premix  Status:  Discontinued        1,000 mg 200 mL/hr over 60 Minutes Intravenous  Once 08/08/19 0449 08/08/19 1547   08/08/19 0300  vancomycin (VANCOCIN) IVPB 1000 mg/200 mL premix        1,000 mg 200 mL/hr over 60 Minutes Intravenous  Once 08/08/19 0225 08/08/19 0338   08/07/19 2345  piperacillin-tazobactam (ZOSYN) IVPB 3.375 g        3.375 g 100 mL/hr over 30 Minutes Intravenous  Once 08/07/19 2331 08/08/19 0015      Current Facility-Administered Medications  Medication Dose Route Frequency Provider Last Rate Last Admin  . 0.9 %  sodium chloride infusion   Intravenous Continuous Loletha Grayer, MD 30 mL/hr at 08/08/19 1751 New Bag at 08/08/19 1751  . acetaminophen (TYLENOL) tablet 650 mg  650 mg Oral Q6H PRN [name], MD       Or  . acetaminophen (TYLENOL) suppository 650 mg  650 mg Rectal Q6H PRN [name], MD      . atorvastatin (LIPITOR) tablet 80 mg  80 mg Oral QHS Loletha Grayer, MD   80 mg at 08/08/19 2103  . brimonidine (ALPHAGAN) 0.2 % ophthalmic solution 1 drop  1 drop Both  Eyes BID Loletha Grayer, MD   1 drop at 08/08/19 2329   And  . timolol (TIMOPTIC) 0.5 % ophthalmic solution 1 drop  1 drop Both Eyes BID Loletha Grayer, MD   1 drop at 08/08/19 2329  . enoxaparin (LOVENOX) injection 40 mg  40 mg Subcutaneous Q24H [name], MD   40 mg at 08/08/19 0941  . HYDROcodone-acetaminophen (NORCO/VICODIN) 5-325 MG per tablet 1-2 tablet  1-2 tablet Oral Q4H PRN [name], MD   2 tablet at 08/08/19 1235  . insulin aspart (novoLOG) injection 0-15 Units  0-15 Units Subcutaneous TID WC [name], MD   2 Units at 08/09/19 [phone]  . insulin aspart (novoLOG) injection 0-5 Units  0-5 Units Subcutaneous QHS Judd Gaudier V, MD      . insulin aspart protamine- aspart (NOVOLOG MIX 70/30) injection 25 Units  25 Units Subcutaneous BID WC Loletha Grayer, MD   25 Units at 08/09/19 0815  . morphine 2 MG/ML injection 2 mg  2 mg Intravenous Q2H PRN [name], MD      . neomycin-polymyxin b-dexamethasone (MAXITROL) ophthalmic ointment   Right Eye TID [name], MD   Given at 08/08/19 2104  . ondansetron (ZOFRAN) tablet 4 mg  4  mg Oral Q6H PRN [name], MD       Or  . ondansetron [facility]) injection 4 mg  4 mg Intravenous Q6H PRN Judd Gaudier V, MD      . phenylephrine (NEO-SYNEPHRINE) 0.5 % nasal solution 1 drop  1 drop Each Nare QHS PRN Gregor Hams, MD   1 drop at 08/08/19 0121  . piperacillin-tazobactam (ZOSYN) IVPB 3.375 g  3.375 g Intravenous Q8H Hall, Scott A, RPH 12.5 mL/hr at 08/09/19 0818 3.375 g at 08/09/19 0818  . senna-docusate (Senokot-S) tablet 1 tablet  1 tablet Oral QHS PRN [name], MD      . vancomycin (VANCOREADY) IVPB 1250 mg/250 mL  1,250 mg Intravenous Q24H Loletha Grayer, MD 166.7 mL/hr at 08/08/19 2123 1,250 mg at 08/08/19 2123   Objective: Vital signs in last 24 hours: Temp:  [97.7 F (36.5 C)-98.7 F (37.1 C)] 98.7 F (37.1 C) (06/15 0353) Pulse Rate:  [77-87] 77 (06/15 0353) Resp:  [16-18] 16 (06/15  0353) BP: (120-147)/(64-91) 138/72 (06/15 0353) SpO2:  [97 %-99 %] 99 % (06/15 0353)  Intake/Output from previous day: 06/14 0701 - 06/15 0700 In: 637.1 [P.O.:600; IV Piggyback:37.1] Out: 600 [Urine:600] Intake/Output this shift: Total I/O In: -  Out: 475 [Urine:475]  Physical Exam Vitals and nursing note reviewed.  Constitutional:      General: He is not in acute distress.    Appearance: He is not ill-appearing, toxic-appearing or diaphoretic.  HENT:     Head: Normocephalic and atraumatic.  Pulmonary:     Effort: Pulmonary effort is normal. No respiratory distress.  Genitourinary:    Comments: Buried penis.  Scrotal edema without fluctuance, crepitus, or purulence. Skin:    General: Skin is warm and dry.  Neurological:     Mental Status: He is alert and oriented to person, place, and time.  Psychiatric:        Mood and Affect: Mood normal.        Behavior: Behavior normal.    Lab Results:  Recent Labs    08/07/19 2008 08/09/19 0419  WBC 8.9 9.5  HGB 10.9* 9.5*  HCT 32.7* 29.1*  PLT 267 251   BMET Recent Labs    08/07/19 2008 08/07/19 2008 08/08/19 0944 08/09/19 0419  NA 135  --   --  135  K 4.1  --   --  4.2  CL 102  --   --  101  CO2 23  --   --  25  GLUCOSE 184*  --   --  132*  BUN 29*  --   --  33*  CREATININE 1.89*   < > 1.94* 2.14*  CALCIUM 9.2  --   --  8.4*   < > = values in this interval not displayed.   Assessment & Plan: 69 year old male s/p left hydrocelectomy on 08/02/2019 with subsequent Penrose drain removal in clinic on 08/05/2019 readmitted on 08/07/2019 with interval development of superficial left scrotal abscess, now s/p I&D with [name].  Additionally, incidental finding of right renal mass on CT.  Patient feeling better today with some persistent scrotal pain, improved since surgery.  On empiric antibiotics with wound cultures pending.  Recommendations: -Continue IV antibiotics -Scrotal support: Scrotal elevation, compression,  and cryotherapy for edema and pain control -We will arrange outpatient follow-up for further imaging of incidental right renal mass  Debroah Loop, PA-C 08/09/2019

## 2019-08-10 LAB — CBC
HCT: 31.4 % — ABNORMAL LOW (ref 39.0–52.0)
Hemoglobin: 10.2 g/dL — ABNORMAL LOW (ref 13.0–17.0)
MCH: 27.8 pg (ref 26.0–34.0)
MCHC: 32.5 g/dL (ref 30.0–36.0)
MCV: 85.6 fL (ref 80.0–100.0)
Platelets: 301 10*3/uL (ref 150–400)
RBC: 3.67 MIL/uL — ABNORMAL LOW (ref 4.22–5.81)
RDW: 14.3 % (ref 11.5–15.5)
WBC: 9.7 10*3/uL (ref 4.0–10.5)
nRBC: 0 % (ref 0.0–0.2)

## 2019-08-10 LAB — BASIC METABOLIC PANEL
Anion gap: 11 (ref 5–15)
BUN: 27 mg/dL — ABNORMAL HIGH (ref 8–23)
CO2: 23 mmol/L (ref 22–32)
Calcium: 8.8 mg/dL — ABNORMAL LOW (ref 8.9–10.3)
Chloride: 103 mmol/L (ref 98–111)
Creatinine, Ser: 2.1 mg/dL — ABNORMAL HIGH (ref 0.61–1.24)
GFR calc Af Amer: 36 mL/min — ABNORMAL LOW (ref >60)
GFR calc non Af Amer: 31 mL/min — ABNORMAL LOW (ref >60)
Glucose, Bld: 95 mg/dL (ref 70–99)
Potassium: 3.8 mmol/L (ref 3.5–5.1)
Sodium: 137 mmol/L (ref 135–145)

## 2019-08-10 LAB — GLUCOSE, CAPILLARY
Glucose-Capillary: 98 mg/dL (ref 70–99)
Glucose-Capillary: 172 mg/dL — ABNORMAL HIGH (ref 70–99)
Glucose-Capillary: 167 mg/dL — ABNORMAL HIGH (ref 70–99)
Glucose-Capillary: 93 mg/dL (ref 70–99)

## 2019-08-10 MED ORDER — DICLOFENAC SODIUM 1 % EX GEL
2.0000 g | Freq: Four times a day (QID) | CUTANEOUS | Status: DC
  Administered 2019-08-10 – 2019-08-11 (×4): 2 g via TOPICAL
  Filled 2019-08-10: qty 100

## 2019-08-10 NOTE — Progress Notes (Signed)
Adam [name] NAME: Adam [name]    MR#:  [id number]  DATE OF BIRTH:  November 20, 1950  SUBJECTIVE:  complains of left knee and left foot pain. Hurts on moving it. Has history of arthritis. Eating well daughter at bedside REVIEW OF SYSTEMS:   Review of Systems  Constitutional: Negative for chills, fever and weight loss.  HENT: Negative for ear discharge, ear pain and nosebleeds.   Eyes: Negative for blurred vision, pain and discharge.  Respiratory: Negative for sputum production, shortness of breath, wheezing and stridor.   Cardiovascular: Negative for chest pain, palpitations, orthopnea and PND.  Gastrointestinal: Negative for abdominal pain, diarrhea, nausea and vomiting.  Genitourinary: Negative for frequency and urgency.  Musculoskeletal: Positive for joint pain. Negative for back pain.  Neurological: Negative for sensory change, speech change, focal weakness and weakness.  Psychiatric/Behavioral: Negative for depression and hallucinations. The patient is not nervous/anxious.    Tolerating Diet:yes Tolerating PT: HHPT  DRUG ALLERGIES:   Allergies  Allergen Reactions  . Ibuprofen Swelling    LEGS  . Cephalexin Rash    VITALS:  Blood pressure 128/82, pulse 84, temperature 98 F (36.7 C), temperature source Oral, resp. rate 20, height 5\' 11"  (1.803 m), weight 118.8 kg, SpO2 98 %.  PHYSICAL EXAMINATION:   Physical Exam  GENERAL:  69 y.o.-year-old patient lying in the bed with no acute distress.  EYES: Pupils equal, round, reactive to light and accommodation. No scleral icterus.   HEENT: Head atraumatic, normocephalic. Oropharynx and nasopharynx clear.  NECK:  Supple, no jugular venous distention. No thyroid enlargement, no tenderness.  LUNGS: Normal breath sounds bilaterally, no wheezing, rales, rhonchi. No use of accessory muscles of respiration.  CARDIOVASCULAR: S1, S2 normal. No murmurs, rubs, or gallops.  ABDOMEN:  Soft, nontender, nondistended. Bowel sounds present. No organomegaly or mass. Scrotal dressing+ EXTREMITIES: No cyanosis, clubbing or edema b/l.    NEUROLOGIC: Cranial nerves II through XII are intact. No focal Motor or sensory deficits b/l.   PSYCHIATRIC:  patient is alert and oriented x 3.  SKIN: No obvious rash, lesion, or ulcer.   LABORATORY PANEL:  CBC Recent Labs  Lab 08/10/19 0534  WBC 9.7  HGB 10.2*  HCT 31.4*  PLT 301    Chemistries  Recent Labs  Lab 08/07/19 2008 08/08/19 0944 08/10/19 0534  NA 135   < > 137  K 4.1   < > 3.8  CL 102   < > 103  CO2 23   < > 23  GLUCOSE 184*   < > 95  BUN 29*   < > 27*  CREATININE 1.89*   < > 2.10*  CALCIUM 9.2   < > 8.8*  AST 24  --   --   ALT 19  --   --   ALKPHOS 67  --   --   BILITOT 0.8  --   --    < > = values in this interval not displayed.   Cardiac Enzymes No results for input(s): TROPONINI in the last 168 hours. RADIOLOGY:  No results found. ASSESSMENT AND PLAN:  Adam [name] is a 69 y.o. male with medical history significant for diabetes and hypertension who recently underwent hydrocelectomy on 08/02/2019 for a large left hydrocele by [name] with follow-up on 08/05/2019 for drain removal who presents to the emergency room with a complaint of oozing from the surgical site associated with pain to the left scrotum.  He  denied fever and chills.  1. Postoperative complication with scrotal abscess.  Patient taken to the operating room on 08/08/2019 by [name].  Continue vancomycin and Zosyn. Narrow down antibiotics once culture results are available. 2. Right eye pain and blurred vision.  Appreciate [name] expertise.  Pain and blurred vision should get better over the next few days.  Eyedrops continued. 3. Acute kidney injury on chronic kidney disease stage IIIa.  Holding antihypertensive medications and medications that can affect kidney function.  Gentle IV fluids. -Creatinine 2.10. 4. Glaucoma continue  Combigan. 5. Type 2 diabetes mellitus with hyperlipidemia on sliding scale and atorvastatin. 6. Right Renal mass.  Follow-up with urology as outpatient. 7. Left knee and left foot arthritis trial of diclofenac gel   Procedures: incision drainage of scrotal abscess on 6/14 Family communication : daughter in the room Consults : urology CODE STATUS: full DVT Prophylaxis : Lovenox  Status is: Inpatient  Remains inpatient appropriate because:IV treatments appropriate due to intensity of illness or inability to take PO   Dispo: The patient is from: Home              Anticipated d/c is to: Home              Anticipated d/c date is: 1 day              Patient currently is not medically stable to d/c. urology needs to remove Penrose drain tomorrow and awaiting culture sensitivity on scrotal abscess plus to determine oral antibiotics.      TOTAL TIME TAKING CARE OF THIS PATIENT: *30* minutes.  >50% time spent on counselling and coordination of care  Note: This dictation was prepared with Dragon dictation along with smaller phrase technology. Any transcriptional errors that result from this process are unintentional.  Fritzi Mandes [name]    [facility]   CC: Primary care physician; Adam [name], MDPatient ID: Adam [name], male   DOB: Aug 25, 1950, 69 y.o.   MRN: [id number]

## 2019-08-10 NOTE — Progress Notes (Signed)
Urology Inpatient Progress Note  Subjective: Afebrile overnight WBC count stable at 9.7 Abscess cultures pending with rare gram-positive cocci Patient reports stable scrotal discomfort  Anti-infectives: Anti-infectives (From admission, onward)   Start     Dose/Rate Route Frequency Ordered Stop   08/09/19 1800  vancomycin (VANCOREADY) IVPB 1750 mg/350 mL  Status:  Discontinued        1,750 mg 175 mL/hr over 120 Minutes Intravenous Every 36 hours 08/08/19 0453 08/08/19 1547   08/08/19 2200  vancomycin (VANCOREADY) IVPB 1250 mg/250 mL     Discontinue     1,250 mg 166.7 mL/hr over 90 Minutes Intravenous Every 24 hours 08/08/19 1547     08/08/19 0800  piperacillin-tazobactam (ZOSYN) IVPB 3.375 g     Discontinue     3.375 g 12.5 mL/hr over 240 Minutes Intravenous Every 8 hours 08/08/19 0447     08/08/19 0515  vancomycin (VANCOCIN) IVPB 1000 mg/200 mL premix  Status:  Discontinued        1,000 mg 200 mL/hr over 60 Minutes Intravenous  Once 08/08/19 0449 08/08/19 1547   08/08/19 0300  vancomycin (VANCOCIN) IVPB 1000 mg/200 mL premix        1,000 mg 200 mL/hr over 60 Minutes Intravenous  Once 08/08/19 0225 08/08/19 0338   08/07/19 2345  piperacillin-tazobactam (ZOSYN) IVPB 3.375 g        3.375 g 100 mL/hr over 30 Minutes Intravenous  Once 08/07/19 2331 08/08/19 0015      Current Facility-Administered Medications  Medication Dose Route Frequency Provider Last Rate Last Admin  . 0.9 %  sodium chloride infusion   Intravenous Continuous Loletha Grayer, MD 65 mL/hr at 08/09/19 1526 Rate Change at 08/09/19 1526  . acetaminophen (TYLENOL) tablet 650 mg  650 mg Oral Q6H PRN [name], MD       Or  . acetaminophen (TYLENOL) suppository 650 mg  650 mg Rectal Q6H PRN [name], MD      . atorvastatin (LIPITOR) tablet 80 mg  80 mg Oral QHS Loletha Grayer, MD   80 mg at 08/09/19 2116  . brimonidine (ALPHAGAN) 0.2 % ophthalmic solution 1 drop  1 drop Both Eyes BID Loletha Grayer, MD    1 drop at 08/10/19 0834   And  . timolol (TIMOPTIC) 0.5 % ophthalmic solution 1 drop  1 drop Both Eyes BID Loletha Grayer, MD   1 drop at 08/10/19 0832  . enoxaparin (LOVENOX) injection 40 mg  40 mg Subcutaneous Q24H [name], MD   40 mg at 08/10/19 0828  . HYDROcodone-acetaminophen (NORCO/VICODIN) 5-325 MG per tablet 1-2 tablet  1-2 tablet Oral Q4H PRN [name], MD   2 tablet at 08/08/19 1235  . insulin aspart (novoLOG) injection 0-15 Units  0-15 Units Subcutaneous TID WC [name], MD   2 Units at 08/09/19 [phone]  . insulin aspart (novoLOG) injection 0-5 Units  0-5 Units Subcutaneous QHS Judd Gaudier V, MD      . insulin aspart protamine- aspart (NOVOLOG MIX 70/30) injection 25 Units  25 Units Subcutaneous BID WC Loletha Grayer, MD   25 Units at 08/10/19 5993  . morphine 2 MG/ML injection 2 mg  2 mg Intravenous Q2H PRN [name], MD      . neomycin-polymyxin b-dexamethasone (MAXITROL) ophthalmic ointment   Right Eye TID [name], MD   Given at 08/10/19 [phone]  . ondansetron (ZOFRAN) tablet 4 mg  4 mg Oral Q6H PRN [name],  MD       Or  . ondansetron (ZOFRAN) injection 4 mg  4 mg Intravenous Q6H PRN Judd Gaudier V, MD      . phenylephrine (NEO-SYNEPHRINE) 0.5 % nasal solution 1 drop  1 drop Each Nare QHS PRN Gregor Hams, MD   1 drop at 08/08/19 0121  . piperacillin-tazobactam (ZOSYN) IVPB 3.375 g  3.375 g Intravenous Q8H Hall, Scott A, RPH 12.5 mL/hr at 08/10/19 0832 3.375 g at 08/10/19 0832  . senna-docusate (Senokot-S) tablet 1 tablet  1 tablet Oral QHS PRN [name], MD      . vancomycin (VANCOREADY) IVPB 1250 mg/250 mL  1,250 mg Intravenous Q24H Loletha Grayer, MD   Stopped at 08/09/19 2255   Objective: Vital signs in last 24 hours: Temp:  [98.3 F (36.8 C)-99.1 F (37.3 C)] 98.5 F (36.9 C) (06/16 0525) Pulse Rate:  [83-90] 90 (06/16 0525) Resp:  [18-20] 20 (06/16 0525) BP: (112-142)/(82-92) 142/82 (06/16 0525) SpO2:  [99  %-100 %] 100 % (06/16 0525)  Intake/Output from previous day: 06/15 0701 - 06/16 0700 In: 1843.8 [P.O.:600; I.V.:487.6; IV Piggyback:756.2] Out: 2125 [Urine:2125] Intake/Output this shift: Total I/O In: 240 [P.O.:240] Out: -   Physical Exam Vitals and nursing note reviewed.  Constitutional:      General: He is not in acute distress.    Appearance: He is not ill-appearing, toxic-appearing or diaphoretic.  HENT:     Head: Normocephalic and atraumatic.  Pulmonary:     Effort: Pulmonary effort is normal. No respiratory distress.  Genitourinary:    Comments: Midline scrotal incision visualized with protruding large Penrose drain at the base.  Second Penrose drain visualized at the dependent left scrotum.  Scrotal edema noted throughout without purulence, fluctuance, or crepitus. Skin:    General: Skin is warm and dry.  Neurological:     Mental Status: He is alert and oriented to person, place, and time.  Psychiatric:        Mood and Affect: Mood normal.        Behavior: Behavior normal.    Lab Results:  Recent Labs    08/09/19 0419 08/10/19 0534  WBC 9.5 9.7  HGB 9.5* 10.2*  HCT 29.1* 31.4*  PLT 251 301   BMET Recent Labs    08/09/19 0419 08/10/19 0534  NA 135 137  K 4.2 3.8  CL 101 103  CO2 25 23  GLUCOSE 132* 95  BUN 33* 27*  CREATININE 2.14* 2.10*  CALCIUM 8.4* 8.8*   Assessment & Plan: 69 year old male s/p left hydrocelectomy on 08/02/2019 with subsequent Penrose drain removal in clinic on 08/05/2019 readmitted on 08/07/2019 with interval development of superficial left scrotal abscess, now s/p I&D with [name].  Additionally, incidental finding of right renal mass on CT.  Pain stable today on empiric antibiotics with wound cultures pending.  I removed the large midline Penrose drain at the bedside this morning.  I used sterile scissors and forceps to remove the securing stitch and remove the drain in its entirety without difficulty.  Patient tolerated well.   I inserted a 4 x 4 wet-to-dry gauze in the inferior portion of the midline scrotal incision following drain removal and cover the wound with an ABD pad.  We will likely plan for removal of his remaining Penrose drain tomorrow per output.  Recommendations: -Continue IV antibiotics -Continue scrotal support -We will arrange outpatient follow-up for further imaging of incidental right renal mass  Debroah Loop, PA-C 08/10/2019

## 2019-08-11 ENCOUNTER — Telehealth: Payer: Self-pay | Admitting: Physician Assistant

## 2019-08-11 LAB — BASIC METABOLIC PANEL
Anion gap: 10 (ref 5–15)
BUN: 21 mg/dL (ref 8–23)
CO2: 22 mmol/L (ref 22–32)
Calcium: 8.6 mg/dL — ABNORMAL LOW (ref 8.9–10.3)
Chloride: 104 mmol/L (ref 98–111)
Creatinine, Ser: 1.79 mg/dL — ABNORMAL HIGH (ref 0.61–1.24)
GFR calc Af Amer: 44 mL/min — ABNORMAL LOW (ref >60)
GFR calc non Af Amer: 38 mL/min — ABNORMAL LOW (ref >60)
Glucose, Bld: 145 mg/dL — ABNORMAL HIGH (ref 70–99)
Potassium: 3.6 mmol/L (ref 3.5–5.1)
Sodium: 136 mmol/L (ref 135–145)

## 2019-08-11 LAB — CBC
HCT: 29.6 % — ABNORMAL LOW (ref 39.0–52.0)
Hemoglobin: 10 g/dL — ABNORMAL LOW (ref 13.0–17.0)
MCH: 27.9 pg (ref 26.0–34.0)
MCHC: 33.8 g/dL (ref 30.0–36.0)
MCV: 82.5 fL (ref 80.0–100.0)
Platelets: 311 10*3/uL (ref 150–400)
RBC: 3.59 MIL/uL — ABNORMAL LOW (ref 4.22–5.81)
RDW: 14.4 % (ref 11.5–15.5)
WBC: 9 10*3/uL (ref 4.0–10.5)
nRBC: 0 % (ref 0.0–0.2)

## 2019-08-11 LAB — GLUCOSE, CAPILLARY
Glucose-Capillary: 78 mg/dL (ref 70–99)
Glucose-Capillary: 125 mg/dL — ABNORMAL HIGH (ref 70–99)

## 2019-08-11 MED ORDER — SULFAMETHOXAZOLE-TRIMETHOPRIM 800-160 MG PO TABS
1.0000 | ORAL_TABLET | Freq: Two times a day (BID) | ORAL | Status: DC
  Administered 2019-08-11: 1 via ORAL
  Filled 2019-08-11 (×2): qty 1

## 2019-08-11 MED ORDER — DICLOFENAC SODIUM 1 % EX GEL: 2.0000 g | Freq: Four times a day (QID) | CUTANEOUS | 0 refills | Status: DC | PRN

## 2019-08-11 MED ORDER — INSULIN ASPART PROT & ASPART (70-30 MIX) 100 UNIT/ML ~~LOC~~ SUSP: 25.0000 [IU] | Freq: Two times a day (BID) | SUBCUTANEOUS | 11 refills | Status: DC

## 2019-08-11 MED ORDER — NEOMYCIN-POLYMYXIN-DEXAMETH 3.5-10000-0.1 OP OINT: 1.0000 "application " | TOPICAL_OINTMENT | Freq: Three times a day (TID) | OPHTHALMIC | 0 refills | Status: DC

## 2019-08-11 MED ORDER — SULFAMETHOXAZOLE-TRIMETHOPRIM 800-160 MG PO TABS: 1.0000 | ORAL_TABLET | Freq: Two times a day (BID) | ORAL | 0 refills | Status: AC

## 2019-08-11 NOTE — Progress Notes (Signed)
Urology Inpatient Progress Note  Subjective: Afebrile, VSS Patient reports resolution in scrotal pain and decreased drainage from remaining Penrose drain Abscess culture with normal skin flora and no anaerobes at 5 days  Anti-infectives: Anti-infectives (From admission, onward)   Start     Dose/Rate Route Frequency Ordered Stop   08/09/19 1800  vancomycin (VANCOREADY) IVPB 1750 mg/350 mL  Status:  Discontinued        1,750 mg 175 mL/hr over 120 Minutes Intravenous Every 36 hours 08/08/19 0453 08/08/19 1547   08/08/19 2200  vancomycin (VANCOREADY) IVPB 1250 mg/250 mL     Discontinue     1,250 mg 166.7 mL/hr over 90 Minutes Intravenous Every 24 hours 08/08/19 1547     08/08/19 0800  piperacillin-tazobactam (ZOSYN) IVPB 3.375 g     Discontinue     3.375 g 12.5 mL/hr over 240 Minutes Intravenous Every 8 hours 08/08/19 0447     08/08/19 0515  vancomycin (VANCOCIN) IVPB 1000 mg/200 mL premix  Status:  Discontinued        1,000 mg 200 mL/hr over 60 Minutes Intravenous  Once 08/08/19 0449 08/08/19 1547   08/08/19 0300  vancomycin (VANCOCIN) IVPB 1000 mg/200 mL premix        1,000 mg 200 mL/hr over 60 Minutes Intravenous  Once 08/08/19 0225 08/08/19 0338   08/07/19 2345  piperacillin-tazobactam (ZOSYN) IVPB 3.375 g        3.375 g 100 mL/hr over 30 Minutes Intravenous  Once 08/07/19 2331 08/08/19 0015      Current Facility-Administered Medications  Medication Dose Route Frequency Provider Last Rate Last Admin  . 0.9 %  sodium chloride infusion   Intravenous Continuous Loletha Grayer, MD 65 mL/hr at 08/09/19 1526 Rate Change at 08/09/19 1526  . acetaminophen (TYLENOL) tablet 650 mg  650 mg Oral Q6H PRN [name], MD       Or  . acetaminophen (TYLENOL) suppository 650 mg  650 mg Rectal Q6H PRN [name], MD      . atorvastatin (LIPITOR) tablet 80 mg  80 mg Oral QHS Loletha Grayer, MD   80 mg at 08/10/19 2221  . brimonidine (ALPHAGAN) 0.2 % ophthalmic solution 1 drop  1 drop  Both Eyes BID Loletha Grayer, MD   1 drop at 08/10/19 2226   And  . timolol (TIMOPTIC) 0.5 % ophthalmic solution 1 drop  1 drop Both Eyes BID Loletha Grayer, MD   1 drop at 08/10/19 2226  . diclofenac Sodium (VOLTAREN) 1 % topical gel 2 g  2 g Topical QID Fritzi Mandes, MD   2 g at 08/10/19 2222  . enoxaparin (LOVENOX) injection 40 mg  40 mg Subcutaneous Q24H [name], MD   40 mg at 08/10/19 0828  . HYDROcodone-acetaminophen (NORCO/VICODIN) 5-325 MG per tablet 1-2 tablet  1-2 tablet Oral Q4H PRN [name], MD   2 tablet at 08/08/19 1235  . insulin aspart (novoLOG) injection 0-15 Units  0-15 Units Subcutaneous TID WC [name], MD   3 Units at 08/10/19 1908  . insulin aspart (novoLOG) injection 0-5 Units  0-5 Units Subcutaneous QHS Judd Gaudier V, MD      . insulin aspart protamine- aspart (NOVOLOG MIX 70/30) injection 25 Units  25 Units Subcutaneous BID WC Loletha Grayer, MD   25 Units at 08/10/19 1908  . morphine 2 MG/ML injection 2 mg  2 mg Intravenous Q2H PRN [name], MD      . neomycin-polymyxin b-dexamethasone (  MAXITROL) ophthalmic ointment   Right Eye TID [name], MD   Given at 08/10/19 2222  . ondansetron (ZOFRAN) tablet 4 mg  4 mg Oral Q6H PRN [name], MD       Or  . ondansetron [facility]) injection 4 mg  4 mg Intravenous Q6H PRN [name], MD      . phenylephrine (NEO-SYNEPHRINE) 0.5 % nasal solution 1 drop  1 drop Each Nare QHS PRN Gregor Hams, MD   1 drop at 08/08/19 0121  . piperacillin-tazobactam (ZOSYN) IVPB 3.375 g  3.375 g Intravenous Q8H Hall, Scott A, RPH 12.5 mL/hr at 08/11/19 0037 3.375 g at 08/11/19 0037  . senna-docusate (Senokot-S) tablet 1 tablet  1 tablet Oral QHS PRN [name], MD      . vancomycin (VANCOREADY) IVPB 1250 mg/250 mL  1,250 mg Intravenous Q24H Loletha Grayer, MD 166.7 mL/hr at 08/10/19 2229 1,250 mg at 08/10/19 2229   Objective: Vital signs in last 24 hours: Temp:  [98 F (36.7 C)-98.6  F (37 C)] 98.6 F (37 C) (06/17 0459) Pulse Rate:  [84] 84 (06/17 0459) Resp:  [18-20] 18 (06/17 0459) BP: (128-140)/(82) 140/82 (06/17 0459) SpO2:  [98 %-100 %] 100 % (06/17 0459)  Intake/Output from previous day: 06/16 0701 - 06/17 0700 In: 480 [P.O.:480] Out: 2450 [Urine:2450] Intake/Output this shift: Total I/O In: -  Out: 450 [Urine:450]  Physical Exam Vitals and nursing note reviewed.  Constitutional:      General: He is not in acute distress.    Appearance: He is not ill-appearing, toxic-appearing or diaphoretic.  HENT:     Head: Normocephalic and atraumatic.  Pulmonary:     Effort: Pulmonary effort is normal. No respiratory distress.  Genitourinary:    Comments: Wet-to-dry dressing present at the base of the midline scrotal incision.  Penrose drain visualized protruding from the dependent left scrotum.  Scant drainage noted on the patient's under pad around this drain. Skin:    General: Skin is warm and dry.  Neurological:     Mental Status: He is alert and oriented to person, place, and time.  Psychiatric:        Mood and Affect: Mood normal.        Behavior: Behavior normal.    Lab Results:  Recent Labs    08/09/19 0419 08/10/19 0534  WBC 9.5 9.7  HGB 9.5* 10.2*  HCT 29.1* 31.4*  PLT 251 301   BMET Recent Labs    08/09/19 0419 08/10/19 0534  NA 135 137  K 4.2 3.8  CL 101 103  CO2 25 23  GLUCOSE 132* 95  BUN 33* 27*  CREATININE 2.14* 2.10*  CALCIUM 8.4* 8.8*   Assessment & Plan: 69 year old male s/p left hydrocelectomy on 08/02/2019 with Penrose drain removal in clinic on 08/05/2019 readmitted on 08/07/2019 with interval development of superficial left scrotal abscess, now s/p I&D with [name], additionally, with incidental finding of right renal mass on CT requiring further evaluation.  One of two Penrose drains removed at the bedside yesterday with wet-to-dry dressings applied to the base of his midline scrotal incision to allow this wound to  heal with secondary intention.  Pain improved today on empiric antibiotics, wound cultures with normal skin flora.  I removed the remaining dependent left scrotal Penrose drain at the bedside this morning.  I used sterile scissors and forceps to remove the securing stitch and remove the drain in its entirety without difficulty.  Patient  tolerated well.  Additionally, I replaced the patient's midline wet-to-dry dressing and covered his scrotum with a sterile gauze pad.  Okay for discharge from the urologic perspective today.    Recommendations: -Outpatient Bactrim DS twice daily x5 days -Daily wet-to-dry dressing changes at the base of his midline scrotal incision -Scrotal support: Compressive underwear/jockstrap, scrotal elevation, cryotherapy as needed -We will arrange outpatient follow-up in 2 weeks for wound recheck and to discuss further imaging of incidental right renal mass  Debroah Loop, PA-C 08/11/2019

## 2019-08-11 NOTE — Telephone Encounter (Signed)
Please schedule this patient for outpatient follow-up with [name] in 2 weeks for wound recheck and to discuss follow-up imaging for further evaluation of incidental right renal mass.

## 2019-08-11 NOTE — Plan of Care (Signed)
Discharge order received. Patient mental status is at baseline. Vital signs stable . No signs of acute distress. Discharge instructions given to daughter and pt.Patient verbalized understanding. No other issues noted at this time.

## 2019-08-11 NOTE — TOC Initial Note (Signed)
Transition of Care [facility]) - Initial/Assessment Note    Patient Details  Name: Adam [name] MRN: [id number] Date of Birth: 05-02-1950  Transition of Care [facility]) CM/SW Contact:    Beverly Sessions, RN Phone Number: 08/11/2019, 2:50 PM  Clinical Narrative:                 Patient admitted from home with scrotal ab cess Patient states that he lives at home with wife. :Adult children live locally for support  PCP [place]-  Patient states due to vision changes his wife take him to appointments  Pharmacy - [facility], and [facility] - denies issues obtaining medications  PT has assessed patient and recommends home health PT.  Patient in agreement.  Home health RN ordered for dressing changes.  Patient states that he does not have a preference of home health agency.  Referral made to [facility] with [facility]. Bedside RN to educate patient and daughter on dressing changes prior to discharge    Expected Discharge Plan: [place] Barriers to Discharge: No Barriers Identified   Patient Goals and CMS Choice        Expected Discharge Plan and Services Expected Discharge Plan: [place]   Discharge Planning Services: CM Consult Post Acute Care Choice: [place] arrangements for the past 2 months: Single Family Home Expected Discharge Date: 08/11/19                         HH Arranged: RN, PT HH Agency: [place] Date [facility] Agency Contacted: 08/11/19   Representative spoke with at [place]: Tommi Rumps  Prior Living Arrangements/Services Living arrangements for the past 2 months: Single Family Home Lives with:: Spouse Patient language and need for interpreter reviewed:: Yes Do you feel safe going back to the place where you live?: Yes      Need for Family Participation in Patient Care: Yes (Comment) Care giver support system in place?: Yes (comment) Current home services: DME Criminal Activity/Legal Involvement Pertinent to  Current Situation/Hospitalization: No - Comment as needed  Activities of Daily Living Home Assistive Devices/Equipment: None ADL Screening (condition at time of admission) Patient's cognitive ability adequate to safely complete daily activities?: Yes Is the patient deaf or have difficulty hearing?: No Does the patient have difficulty seeing, even when wearing glasses/contacts?: No Does the patient have difficulty concentrating, remembering, or making decisions?: No Patient able to express need for assistance with ADLs?: No Does the patient have difficulty dressing or bathing?: No Independently performs ADLs?: Yes (appropriate for developmental age) Does the patient have difficulty walking or climbing stairs?: No Weakness of Legs: None Weakness of Arms/Hands: None  Permission Sought/Granted                  Emotional Assessment       Orientation: : Oriented to Self, Oriented to Place, Oriented to  Time, Oriented to Situation      Admission diagnosis:  Renal mass [N28.89] Scrotal abscess [N49.2] Postoperative infection, unspecified type, initial encounter [T81.40XA] Patient Active Problem List   Diagnosis Date Noted  . Scrotal abscess 08/08/2019  . Type 2 diabetes mellitus with hyperlipidemia ([place]) 08/08/2019  . Postoperative infection   . Acute right eye pain   . Acute kidney injury superimposed on CKD ([place])   . Renal mass   . Scrotal swelling 07/04/2019  . Osteoarthritis of right knee 01/26/2019  . Tremor 07/28/2018  .  Dermatitis 07/22/2018  . Leg weakness, bilateral 04/01/2018  . Hypoglycemia 10/22/2017  . Scrotal mass 08/17/2017  . Chronic kidney disease, stage III (moderate) 05/25/2017  . Severe obesity (BMI 35.0-39.9) with comorbidity ([place]) 05/25/2017  . S/P rotator cuff surgery 02/19/2017  . BPH (benign prostatic hyperplasia) 05/13/2016  . Sleep disturbance 05/01/2015  . Chronic diastolic heart failure ([place]) 02/02/2015  . Advance directive discussed with  patient 11/07/2014  . Chronic venous insufficiency 01/07/2013  . Type 2 diabetes mellitus with neurological manifestations, uncontrolled ([place]) 06/26/2011  . Routine general medical examination at a health care facility 06/17/2010  . Hyperlipemia 06/09/2006  . Essential hypertension, benign 06/09/2006  . GERD 06/09/2006  . DIVERTICULOSIS, COLON 06/09/2006  . COLONIC POLYPS, HX OF 06/09/2006   PCP:  [name], MD Pharmacy:   [facility] [address], [place] - [place] [address] Phone: [phone] Fax: [facility] [place], [place] [address] Phone: [phone] Fax: [phone]     Social Determinants of Health (SDOH) Interventions    Readmission Risk Interventions No flowsheet data found.

## 2019-08-11 NOTE — Discharge Summary (Signed)
[place] [facility] [place] NAME: Adam [name]    MR#:  [id number]  DATE OF BIRTH:  [birth year]  DATE OF ADMISSION:  08/07/2019 ADMITTING PHYSICIAN: [name], MD  DATE OF DISCHARGE: 08/11/2019  PRIMARY CARE PHYSICIAN: [name], MD    ADMISSION DIAGNOSIS:  Renal mass [N28.89] Scrotal abscess [N49.2] Postoperative infection, unspecified type, initial encounter [T81.40XA]  DISCHARGE DIAGNOSIS:  Scrotal Abscess s/p I and D Acute ON CKD-IIIB Renal mass--incidental--f/u Urology as out pt SECONDARY DIAGNOSIS:   Past Medical History:  Diagnosis Date  . Arthritis   . Cataract   . Diabetes mellitus type II 2002   Hospitalized for very high sugars  . Diverticulosis of colon   . GERD (gastroesophageal reflux disease)    H/O  . History of colonic polyps    Hyperplastic  . Hyperlipidemia   . Hypertension   . Phimosis 2003   Repair  . Sleep apnea    DOES NOT USE CPAP  . Venous insufficiency     HOSPITAL COURSE:  Adam [name] a 69 y.o.malewith medical history significant fordiabetes and hypertension who recently underwent hydrocelectomy on 08/02/2019 for a large left hydrocele by [name] with follow-up on 08/05/2019 for drain removal who presents to the emergency room with a complaint of oozing from the surgical site associated with pain to the left scrotum. He denied fever and chills.  1. Postoperative complication with scrotal abscess. Patient taken to the operating room on 08/08/2019 by [name]. Continue vancomycin and Zosyn. Narrow down antibiotics to bactrim (per urology) culture results negative growth. 2. Right eye pain and blurred vision.Appreciate [name] input. Pain and blurred vision should get better over the next few days. Eyedrops continued. Acute kidney injury on chronic kidney disease stage IIIb. Resumed po home meds except d/c metformin -Creatinine today 1.7 4. Glaucoma continue  Combigan. 5. Type 2 diabetes mellitus with hyperlipidemia on sliding scale and atorvastatin. -resumed [place] and insulin lantus. D/ced metformin. Sugars good 6. RightRenal mass. Follow-up with urology as outpatient. 7. Left knee and left foot arthritis trial of diclofenac gel   Overall stable for d/c spoke with dter [name] on the phone. Pt will benefit from getting BMP drawn in 1 week at PCP's office given po meds and bactrim  Procedures: incision drainage of scrotal abscess on 6/14 Family communication : daughter [name] Consults : urology CODE STATUS: full DVT Prophylaxis : Lovenox  Status is: Inpatient   Dispo: The patient is from: Home  Anticipated d/c is to: Home with [facility]  Anticipated d/c date is: today  Patient currently is medically stable to d/c.   CONSULTS OBTAINED:  Treatment Team:  [name], MD [name], MD  DRUG ALLERGIES:   Allergies  Allergen Reactions  . Ibuprofen Swelling    LEGS  . Cephalexin Rash    DISCHARGE MEDICATIONS:   Allergies as of 08/11/2019      Reactions   Ibuprofen Swelling   LEGS   Cephalexin Rash      Medication List    STOP taking these medications   HumaLOG Mix 75/25 (75-25) 100 UNIT/ML Susp injection Generic drug: insulin lispro protamine-lispro Replaced by: insulin aspart protamine- aspart (70-30) 100 UNIT/ML injection   metFORMIN 1000 MG tablet Commonly known as: GLUCOPHAGE     TAKE these medications   Accu-Chek Aviva Soln   acetaminophen 325 MG tablet Commonly known as: TYLENOL Take 650 mg by mouth every 6 (six) hours as  needed for moderate pain.   Alcohol Swabs Pads Use as directed for glucose monitoring. Diagnosis code E 11.65   aspirin 81 MG tablet Take 81 mg by mouth daily.   atorvastatin 80 MG tablet Commonly known as: LIPITOR Take 1 tablet (80 mg total) by mouth daily. What changed: when to take this   blood glucose meter kit and supplies  Kit One Touch Glucometer Verio.   Dispense based on patient and insurance preference. Use up to four times daily as directed. Diagnosis Code E11.65   Combigan 0.2-0.5 % ophthalmic solution Generic drug: brimonidine-timolol Place 1 drop into both eyes 2 (two) times daily.   diclofenac Sodium 1 % Gel Commonly known as: VOLTAREN Apply 2 g topically 4 (four) times daily as needed.   furosemide 80 MG tablet Commonly known as: LASIX Take 1 tablet (80 mg total) by mouth daily. What changed:   when to take this  reasons to take this   INS SYRINGE/NEEDLE 1CC/28G 28G X 1/2" 1 ML Misc Commonly known as: B-D INS SYR MICROFINE 1CC/28G INJECT 100 UNITS SQ AS DIRECTED TWICE DAILY   insulin aspart protamine- aspart (70-30) 100 UNIT/ML injection Commonly known as: NOVOLOG MIX 70/30 Inject 0.25 mLs (25 Units total) into the skin 2 (two) times daily with a meal. Replaces: HumaLOG Mix 75/25 (75-25) 100 UNIT/ML Susp injection   lisinopril-hydrochlorothiazide 20-25 MG tablet Commonly known as: ZESTORETIC Take 1 tablet by mouth daily. What changed: when to take this   neomycin-polymyxin b-dexamethasone 3.5-10000-0.1 Oint Commonly known as: MAXITROL Place 1 application into the right eye 3 (three) times daily.   onetouch ultrasoft lancets Use as instructed. Diagnosis E 11.65   OneTouch Verio test strip Generic drug: glucose blood 1 each by Other route 2 (two) times daily before a meal. Dx code E11.65   SINEX REGULAR NA Place 1 spray into the nose at bedtime.   sitaGLIPtin 100 MG tablet Commonly known as: Januvia Take 1 tablet (100 mg total) by mouth daily.   sulfamethoxazole-trimethoprim 800-160 MG tablet Commonly known as: BACTRIM DS Take 1 tablet by mouth every 12 (twelve) hours for 5 days.            Discharge Care Instructions  (From admission, onward)         Start     Ordered   08/11/19 0000  Discharge wound care:       Comments: Daily wet-to-dry dressing changes at  the base of his midline scrotal incision -Scrotal support: Compressive underwear/jockstrap, scrotal elevation, cryotherapy as needed   08/11/19 1001          If you experience worsening of your admission symptoms, develop shortness of breath, life threatening emergency, suicidal or homicidal thoughts you must seek medical attention immediately by calling 911 or calling your MD immediately  if symptoms less severe.  You Must read complete instructions/literature along with all the possible adverse reactions/side effects for all the Medicines you take and that have been prescribed to you. Take any new Medicines after you have completely understood and accept all the possible adverse reactions/side effects.   Please note  You were cared for by a hospitalist during your hospital stay. If you have any questions about your discharge medications or the care you received while you were in the hospital after you are discharged, you can call the unit and asked to speak with the hospitalist on call if the hospitalist that took care of you is not available. Once you are discharged, your primary  care physician will handle any further medical issues. Please note that NO REFILLS for any discharge medications will be authorized once you are discharged, as it is imperative that you return to your primary care physician (or establish a relationship with a primary care physician if you do not have one) for your aftercare needs so that they can reassess your need for medications and monitor your lab values. Today   SUBJECTIVE   Doing well. No complaints  VITAL SIGNS:  Blood pressure 140/82, pulse 84, temperature 98.6 F (37 C), temperature source Oral, resp. rate 18, height 5' 11"  (1.803 m), weight 118.8 kg, SpO2 100 %.  I/O:    Intake/Output Summary (Last 24 hours) at 08/11/2019 1004 Last data filed at 08/11/2019 7867 Gross per 24 hour  Intake 480 ml  Output 2900 ml  Net -2420 ml    PHYSICAL  EXAMINATION:  GENERAL:  69 y.o.-year-old patient lying in the bed with no acute distress.  EYES: Pupils equal, round, reactive to light and accommodation. No scleral icterus.  HEENT: Head atraumatic, normocephalic. Oropharynx and nasopharynx clear.  NECK:  Supple, no jugular venous distention. No thyroid enlargement, no tenderness.  LUNGS: Normal breath sounds bilaterally, no wheezing, rales,rhonchi or crepitation. No use of accessory muscles of respiration.  CARDIOVASCULAR: S1, S2 normal. No murmurs, rubs, or gallops.  ABDOMEN: Soft, non-tender, non-distended. Bowel sounds present. No organomegaly or mass. Scrotal area looks OK EXTREMITIES: No pedal edema, cyanosis, or clubbing.  NEUROLOGIC: Cranial nerves II through XII are intact. Muscle strength 5/5 in all extremities. Sensation intact. Gait not checked.  PSYCHIATRIC: The patient is alert and oriented x 3.  SKIN: No obvious rash, lesion, or ulcer.   DATA REVIEW:   CBC  Recent Labs  Lab 08/11/19 0839  WBC 9.0  HGB 10.0*  HCT 29.6*  PLT 311    Chemistries  Recent Labs  Lab 08/07/19 2008 08/08/19 0944 08/11/19 0839  NA 135   < > 136  K 4.1   < > 3.6  CL 102   < > 104  CO2 23   < > 22  GLUCOSE 184*   < > 145*  BUN 29*   < > 21  CREATININE 1.89*   < > 1.79*  CALCIUM 9.2   < > 8.6*  AST 24  --   --   ALT 19  --   --   ALKPHOS 67  --   --   BILITOT 0.8  --   --    < > = values in this interval not displayed.    Microbiology Results   Recent Results (from the past 240 hour(s))  SARS Coronavirus 2 by RT PCR (hospital order, performed in [facility] hospital lab) Nasopharyngeal Nasopharyngeal Swab     Status: None   Collection Time: 08/08/19  2:44 AM   Specimen: Nasopharyngeal Swab  Result Value Ref Range Status   SARS Coronavirus 2 NEGATIVE NEGATIVE Final    Comment: (NOTE) SARS-CoV-2 target nucleic acids are NOT DETECTED.  The SARS-CoV-2 RNA is generally detectable in upper and lower respiratory specimens during  the acute phase of infection. The lowest concentration of SARS-CoV-2 viral copies this assay can detect is 250 copies / mL. A negative result does not preclude SARS-CoV-2 infection and should not be used as the sole basis for treatment or other patient management decisions.  A negative result may occur with improper specimen collection / handling, submission of specimen other than nasopharyngeal swab, presence of viral  mutation(s) within the areas targeted by this assay, and inadequate number of viral copies (<250 copies / mL). A negative result must be combined with clinical observations, patient history, and epidemiological information.  Fact Sheet for Patients:   StrictlyIdeas.no  Fact Sheet for Healthcare Providers: BankingDealers.co.za  This test is not yet approved or  cleared by the [place] FDA and has been authorized for detection and/or diagnosis of SARS-CoV-2 by FDA under an Emergency Use Authorization (EUA).  This EUA will remain in effect (meaning this test can be used) for the duration of the COVID-19 declaration under Section 564(b)(1) of the Act, 21 U.S.C. section 360bbb-3(b)(1), unless the authorization is terminated or revoked sooner.  Performed at [facility], [address] [place], [place] [address]   Aerobic/Anaerobic Culture (surgical/deep wound)     Status: None (Preliminary result)   Collection Time: 08/08/19  6:39 AM   Specimen: PATH Other; Tissue  Result Value Ref Range Status   Specimen Description   Final    ABSCESS SCROTAL Performed at [facility], [address] [place], [place] [address]    Special Requests   Final    NONE Performed at [facility], [address] [place], [place] [address]    Gram Stain   Final    RARE WBC PRESENT, PREDOMINANTLY PMN RARE GRAM POSITIVE COCCI Performed at [facility], [place] [address], [place], [place] [address]     Culture   Final    NORMAL SKIN FLORA NO ANAEROBES ISOLATED; CULTURE IN PROGRESS FOR 5 DAYS    Report Status PENDING  Incomplete    RADIOLOGY:  No results found.   CODE STATUS:     Code Status Orders  (From admission, onward)         Start     Ordered   08/08/19 0426  Full code  Continuous        08/08/19 0427        Code Status History    Date Active Date Inactive Code Status Order ID Comments User Context   02/19/2017 1654 02/21/2017 1810 Full Code [id number]  [name], MD Inpatient   Advance Care Planning Activity       TOTAL TIME TAKING CARE OF THIS PATIENT: *40* minutes.    Fritzi Mandes M.D  [facility]    CC: Primary care physician; [name], MD

## 2019-08-11 NOTE — Discharge Instructions (Signed)
Keep log of your sugars and if BS< 90 adjust your insulin and po medication dosage. Your Metformin has been held due to your Creatinine  Daily wet-to-dry dressing changes at the base of his midline scrotal incision  -Scrotal support: Compressive underwear/jockstrap, scrotal elevation, cryotherapy as needed

## 2019-08-12 ENCOUNTER — Telehealth: Payer: Self-pay

## 2019-08-12 DIAGNOSIS — E1122 Type 2 diabetes mellitus with diabetic chronic kidney disease: Secondary | ICD-10-CM | POA: Diagnosis not present

## 2019-08-12 DIAGNOSIS — E785 Hyperlipidemia, unspecified: Secondary | ICD-10-CM | POA: Diagnosis not present

## 2019-08-12 DIAGNOSIS — E1169 Type 2 diabetes mellitus with other specified complication: Secondary | ICD-10-CM | POA: Diagnosis not present

## 2019-08-12 DIAGNOSIS — I5032 Chronic diastolic (congestive) heart failure: Secondary | ICD-10-CM | POA: Diagnosis not present

## 2019-08-12 DIAGNOSIS — T8141XA Infection following a procedure, superficial incisional surgical site, initial encounter: Secondary | ICD-10-CM | POA: Diagnosis not present

## 2019-08-12 DIAGNOSIS — N492 Inflammatory disorders of scrotum: Secondary | ICD-10-CM | POA: Diagnosis not present

## 2019-08-12 DIAGNOSIS — I130 Hypertensive heart and chronic kidney disease with heart failure and stage 1 through stage 4 chronic kidney disease, or unspecified chronic kidney disease: Secondary | ICD-10-CM | POA: Diagnosis not present

## 2019-08-12 DIAGNOSIS — N1832 Chronic kidney disease, stage 3b: Secondary | ICD-10-CM | POA: Diagnosis not present

## 2019-08-12 DIAGNOSIS — N400 Enlarged prostate without lower urinary tract symptoms: Secondary | ICD-10-CM | POA: Diagnosis not present

## 2019-08-12 NOTE — Telephone Encounter (Signed)
Adam [name], PT, did evaluation on pt today. She needed verbal orders to do PT 1 x week for 5 weeks. With Dr Silvio Pate being out today and Monday, I went ahead and approved it as they will send forms to be signed.

## 2019-08-13 DIAGNOSIS — T8141XA Infection following a procedure, superficial incisional surgical site, initial encounter: Secondary | ICD-10-CM | POA: Diagnosis not present

## 2019-08-13 DIAGNOSIS — I130 Hypertensive heart and chronic kidney disease with heart failure and stage 1 through stage 4 chronic kidney disease, or unspecified chronic kidney disease: Secondary | ICD-10-CM | POA: Diagnosis not present

## 2019-08-13 DIAGNOSIS — E1169 Type 2 diabetes mellitus with other specified complication: Secondary | ICD-10-CM | POA: Diagnosis not present

## 2019-08-13 DIAGNOSIS — N492 Inflammatory disorders of scrotum: Secondary | ICD-10-CM | POA: Diagnosis not present

## 2019-08-13 DIAGNOSIS — E1122 Type 2 diabetes mellitus with diabetic chronic kidney disease: Secondary | ICD-10-CM | POA: Diagnosis not present

## 2019-08-13 DIAGNOSIS — E785 Hyperlipidemia, unspecified: Secondary | ICD-10-CM | POA: Diagnosis not present

## 2019-08-13 DIAGNOSIS — N400 Enlarged prostate without lower urinary tract symptoms: Secondary | ICD-10-CM | POA: Diagnosis not present

## 2019-08-13 DIAGNOSIS — N1832 Chronic kidney disease, stage 3b: Secondary | ICD-10-CM | POA: Diagnosis not present

## 2019-08-13 DIAGNOSIS — I5032 Chronic diastolic (congestive) heart failure: Secondary | ICD-10-CM | POA: Diagnosis not present

## 2019-08-15 LAB — AEROBIC/ANAEROBIC CULTURE W GRAM STAIN (SURGICAL/DEEP WOUND): Culture: NEGATIVE

## 2019-08-15 NOTE — Telephone Encounter (Signed)
Yes that is fine

## 2019-08-17 DIAGNOSIS — E1122 Type 2 diabetes mellitus with diabetic chronic kidney disease: Secondary | ICD-10-CM | POA: Diagnosis not present

## 2019-08-17 DIAGNOSIS — T8141XA Infection following a procedure, superficial incisional surgical site, initial encounter: Secondary | ICD-10-CM | POA: Diagnosis not present

## 2019-08-17 DIAGNOSIS — N400 Enlarged prostate without lower urinary tract symptoms: Secondary | ICD-10-CM | POA: Diagnosis not present

## 2019-08-17 DIAGNOSIS — N1832 Chronic kidney disease, stage 3b: Secondary | ICD-10-CM | POA: Diagnosis not present

## 2019-08-17 DIAGNOSIS — I5032 Chronic diastolic (congestive) heart failure: Secondary | ICD-10-CM | POA: Diagnosis not present

## 2019-08-17 DIAGNOSIS — E1169 Type 2 diabetes mellitus with other specified complication: Secondary | ICD-10-CM | POA: Diagnosis not present

## 2019-08-17 DIAGNOSIS — E785 Hyperlipidemia, unspecified: Secondary | ICD-10-CM | POA: Diagnosis not present

## 2019-08-17 DIAGNOSIS — I130 Hypertensive heart and chronic kidney disease with heart failure and stage 1 through stage 4 chronic kidney disease, or unspecified chronic kidney disease: Secondary | ICD-10-CM | POA: Diagnosis not present

## 2019-08-17 DIAGNOSIS — N492 Inflammatory disorders of scrotum: Secondary | ICD-10-CM | POA: Diagnosis not present

## 2019-08-18 DIAGNOSIS — I130 Hypertensive heart and chronic kidney disease with heart failure and stage 1 through stage 4 chronic kidney disease, or unspecified chronic kidney disease: Secondary | ICD-10-CM | POA: Diagnosis not present

## 2019-08-18 DIAGNOSIS — E1122 Type 2 diabetes mellitus with diabetic chronic kidney disease: Secondary | ICD-10-CM | POA: Diagnosis not present

## 2019-08-18 DIAGNOSIS — I5032 Chronic diastolic (congestive) heart failure: Secondary | ICD-10-CM | POA: Diagnosis not present

## 2019-08-18 DIAGNOSIS — T8141XA Infection following a procedure, superficial incisional surgical site, initial encounter: Secondary | ICD-10-CM | POA: Diagnosis not present

## 2019-08-18 DIAGNOSIS — N1832 Chronic kidney disease, stage 3b: Secondary | ICD-10-CM | POA: Diagnosis not present

## 2019-08-18 DIAGNOSIS — E785 Hyperlipidemia, unspecified: Secondary | ICD-10-CM | POA: Diagnosis not present

## 2019-08-18 DIAGNOSIS — E1169 Type 2 diabetes mellitus with other specified complication: Secondary | ICD-10-CM | POA: Diagnosis not present

## 2019-08-18 DIAGNOSIS — N400 Enlarged prostate without lower urinary tract symptoms: Secondary | ICD-10-CM | POA: Diagnosis not present

## 2019-08-18 DIAGNOSIS — N492 Inflammatory disorders of scrotum: Secondary | ICD-10-CM | POA: Diagnosis not present

## 2019-08-19 ENCOUNTER — Ambulatory Visit: Payer: Medicare PPO | Admitting: Internal Medicine

## 2019-08-19 ENCOUNTER — Encounter: Payer: Self-pay | Admitting: Internal Medicine

## 2019-08-19 ENCOUNTER — Other Ambulatory Visit: Payer: Self-pay

## 2019-08-19 VITALS — BP 110/80 | HR 61 | Temp 97.1°F | Ht 71.0 in | Wt 250.0 lb

## 2019-08-19 DIAGNOSIS — N1832 Chronic kidney disease, stage 3b: Secondary | ICD-10-CM

## 2019-08-19 DIAGNOSIS — IMO0002 Reserved for concepts with insufficient information to code with codable children: Secondary | ICD-10-CM

## 2019-08-19 DIAGNOSIS — N492 Inflammatory disorders of scrotum: Secondary | ICD-10-CM | POA: Diagnosis not present

## 2019-08-19 DIAGNOSIS — E1165 Type 2 diabetes mellitus with hyperglycemia: Secondary | ICD-10-CM | POA: Diagnosis not present

## 2019-08-19 DIAGNOSIS — E1149 Type 2 diabetes mellitus with other diabetic neurological complication: Secondary | ICD-10-CM | POA: Diagnosis not present

## 2019-08-19 DIAGNOSIS — N2889 Other specified disorders of kidney and ureter: Secondary | ICD-10-CM

## 2019-08-19 LAB — RENAL FUNCTION PANEL
Albumin: 3.7 g/dL (ref 3.5–5.2)
BUN: 37 mg/dL — ABNORMAL HIGH (ref 6–23)
CO2: 25 mEq/L (ref 19–32)
Calcium: 9.2 mg/dL (ref 8.4–10.5)
Chloride: 101 mEq/L (ref 96–112)
Creatinine, Ser: 2.44 mg/dL — ABNORMAL HIGH (ref 0.40–1.50)
GFR: 31.88 mL/min — ABNORMAL LOW (ref >60.00)
Glucose, Bld: 164 mg/dL — ABNORMAL HIGH (ref 70–99)
Phosphorus: 3.3 mg/dL (ref 2.3–4.6)
Potassium: 5 mEq/L (ref 3.5–5.1)
Sodium: 132 mEq/L — ABNORMAL LOW (ref 135–145)

## 2019-08-19 NOTE — Assessment & Plan Note (Signed)
Lab Results  Component Value Date   HGBA1C 9.7 (H) 08/08/2019   Much higher but done when he had acute infection and post op Metformin stopped---?CKD Will recheck and start low dose metformin unless CKD 4 Sugars have been okay lately
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
## 2019-08-19 NOTE — Assessment & Plan Note (Signed)
Will restart low dose metformin if better Is on lisinopril

## 2019-08-19 NOTE — Assessment & Plan Note (Signed)
Suspicious for RCC Will need further testing and tissue diagnosis

## 2019-08-19 NOTE — Assessment & Plan Note (Signed)
Better now Done with antibiotics

## 2019-08-22 DIAGNOSIS — N400 Enlarged prostate without lower urinary tract symptoms: Secondary | ICD-10-CM | POA: Diagnosis not present

## 2019-08-22 DIAGNOSIS — G4730 Sleep apnea, unspecified: Secondary | ICD-10-CM

## 2019-08-22 DIAGNOSIS — M1712 Unilateral primary osteoarthritis, left knee: Secondary | ICD-10-CM

## 2019-08-22 DIAGNOSIS — M19072 Primary osteoarthritis, left ankle and foot: Secondary | ICD-10-CM

## 2019-08-22 DIAGNOSIS — Z87891 Personal history of nicotine dependence: Secondary | ICD-10-CM

## 2019-08-22 DIAGNOSIS — I130 Hypertensive heart and chronic kidney disease with heart failure and stage 1 through stage 4 chronic kidney disease, or unspecified chronic kidney disease: Secondary | ICD-10-CM | POA: Diagnosis not present

## 2019-08-22 DIAGNOSIS — Z7982 Long term (current) use of aspirin: Secondary | ICD-10-CM

## 2019-08-22 DIAGNOSIS — E113513 Type 2 diabetes mellitus with proliferative diabetic retinopathy with macular edema, bilateral: Secondary | ICD-10-CM | POA: Diagnosis not present

## 2019-08-22 DIAGNOSIS — N281 Cyst of kidney, acquired: Secondary | ICD-10-CM

## 2019-08-22 DIAGNOSIS — H4311 Vitreous hemorrhage, right eye: Secondary | ICD-10-CM | POA: Diagnosis not present

## 2019-08-22 DIAGNOSIS — N492 Inflammatory disorders of scrotum: Secondary | ICD-10-CM | POA: Diagnosis not present

## 2019-08-22 DIAGNOSIS — Z794 Long term (current) use of insulin: Secondary | ICD-10-CM

## 2019-08-22 DIAGNOSIS — I700 Atherosclerosis of aorta: Secondary | ICD-10-CM

## 2019-08-22 DIAGNOSIS — E1169 Type 2 diabetes mellitus with other specified complication: Secondary | ICD-10-CM | POA: Diagnosis not present

## 2019-08-22 DIAGNOSIS — N1832 Chronic kidney disease, stage 3b: Secondary | ICD-10-CM | POA: Diagnosis not present

## 2019-08-22 DIAGNOSIS — K219 Gastro-esophageal reflux disease without esophagitis: Secondary | ICD-10-CM

## 2019-08-22 DIAGNOSIS — K5730 Diverticulosis of large intestine without perforation or abscess without bleeding: Secondary | ICD-10-CM

## 2019-08-22 DIAGNOSIS — I5032 Chronic diastolic (congestive) heart failure: Secondary | ICD-10-CM | POA: Diagnosis not present

## 2019-08-22 DIAGNOSIS — E785 Hyperlipidemia, unspecified: Secondary | ICD-10-CM | POA: Diagnosis not present

## 2019-08-22 DIAGNOSIS — Z9181 History of falling: Secondary | ICD-10-CM

## 2019-08-22 DIAGNOSIS — I872 Venous insufficiency (chronic) (peripheral): Secondary | ICD-10-CM

## 2019-08-22 DIAGNOSIS — E1122 Type 2 diabetes mellitus with diabetic chronic kidney disease: Secondary | ICD-10-CM | POA: Diagnosis not present

## 2019-08-22 DIAGNOSIS — T8141XA Infection following a procedure, superficial incisional surgical site, initial encounter: Secondary | ICD-10-CM | POA: Diagnosis not present

## 2019-08-22 DIAGNOSIS — Z86010 Personal history of colonic polyps: Secondary | ICD-10-CM

## 2019-08-22 DIAGNOSIS — N179 Acute kidney failure, unspecified: Secondary | ICD-10-CM

## 2019-08-22 DIAGNOSIS — H409 Unspecified glaucoma: Secondary | ICD-10-CM

## 2019-08-22 DIAGNOSIS — H538 Other visual disturbances: Secondary | ICD-10-CM

## 2019-08-24 DIAGNOSIS — I130 Hypertensive heart and chronic kidney disease with heart failure and stage 1 through stage 4 chronic kidney disease, or unspecified chronic kidney disease: Secondary | ICD-10-CM | POA: Diagnosis not present

## 2019-08-24 DIAGNOSIS — I5032 Chronic diastolic (congestive) heart failure: Secondary | ICD-10-CM | POA: Diagnosis not present

## 2019-08-24 DIAGNOSIS — N1832 Chronic kidney disease, stage 3b: Secondary | ICD-10-CM | POA: Diagnosis not present

## 2019-08-24 DIAGNOSIS — N400 Enlarged prostate without lower urinary tract symptoms: Secondary | ICD-10-CM | POA: Diagnosis not present

## 2019-08-24 DIAGNOSIS — E1169 Type 2 diabetes mellitus with other specified complication: Secondary | ICD-10-CM | POA: Diagnosis not present

## 2019-08-24 DIAGNOSIS — N492 Inflammatory disorders of scrotum: Secondary | ICD-10-CM | POA: Diagnosis not present

## 2019-08-24 DIAGNOSIS — E1122 Type 2 diabetes mellitus with diabetic chronic kidney disease: Secondary | ICD-10-CM | POA: Diagnosis not present

## 2019-08-24 DIAGNOSIS — T8141XA Infection following a procedure, superficial incisional surgical site, initial encounter: Secondary | ICD-10-CM | POA: Diagnosis not present

## 2019-08-24 DIAGNOSIS — E785 Hyperlipidemia, unspecified: Secondary | ICD-10-CM | POA: Diagnosis not present

## 2019-08-25 ENCOUNTER — Encounter: Payer: Self-pay | Admitting: Urology

## 2019-08-25 ENCOUNTER — Other Ambulatory Visit: Payer: Self-pay

## 2019-08-25 ENCOUNTER — Encounter: Payer: Medicare PPO | Admitting: Internal Medicine

## 2019-08-25 ENCOUNTER — Ambulatory Visit: Payer: Medicare PPO | Admitting: Urology

## 2019-08-25 VITALS — BP 108/73 | HR 71 | Ht 71.0 in | Wt 250.0 lb

## 2019-08-25 DIAGNOSIS — N2889 Other specified disorders of kidney and ureter: Secondary | ICD-10-CM | POA: Diagnosis not present

## 2019-08-25 DIAGNOSIS — Z9889 Other specified postprocedural states: Secondary | ICD-10-CM | POA: Diagnosis not present
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
## 2019-08-26 ENCOUNTER — Encounter: Payer: Self-pay | Admitting: Urology

## 2019-08-26 DIAGNOSIS — N400 Enlarged prostate without lower urinary tract symptoms: Secondary | ICD-10-CM | POA: Diagnosis not present

## 2019-08-26 DIAGNOSIS — E1122 Type 2 diabetes mellitus with diabetic chronic kidney disease: Secondary | ICD-10-CM | POA: Diagnosis not present

## 2019-08-26 DIAGNOSIS — I5032 Chronic diastolic (congestive) heart failure: Secondary | ICD-10-CM | POA: Diagnosis not present

## 2019-08-26 DIAGNOSIS — T8141XA Infection following a procedure, superficial incisional surgical site, initial encounter: Secondary | ICD-10-CM | POA: Diagnosis not present

## 2019-08-26 DIAGNOSIS — E1169 Type 2 diabetes mellitus with other specified complication: Secondary | ICD-10-CM | POA: Diagnosis not present

## 2019-08-26 DIAGNOSIS — I130 Hypertensive heart and chronic kidney disease with heart failure and stage 1 through stage 4 chronic kidney disease, or unspecified chronic kidney disease: Secondary | ICD-10-CM | POA: Diagnosis not present

## 2019-08-26 DIAGNOSIS — N1832 Chronic kidney disease, stage 3b: Secondary | ICD-10-CM | POA: Diagnosis not present

## 2019-08-26 DIAGNOSIS — E785 Hyperlipidemia, unspecified: Secondary | ICD-10-CM | POA: Diagnosis not present

## 2019-08-26 DIAGNOSIS — N492 Inflammatory disorders of scrotum: Secondary | ICD-10-CM | POA: Diagnosis not present

## 2019-08-30 ENCOUNTER — Telehealth: Payer: Self-pay | Admitting: *Deleted

## 2019-08-30 NOTE — Telephone Encounter (Signed)
Notified patient daughter as instructed, patient daughter pleased. Discussed follow-up appointments, patient agrees

## 2019-08-30 NOTE — Telephone Encounter (Signed)
-----   Message from [name], MD sent at 08/26/2019  5:53 PM EDT ----- Regarding: MRI Please let patient or his daughter know I have put in an order for a renal mass MRI and needs a follow-up appointment with me to discuss results.

## 2019-09-01 DIAGNOSIS — T8141XA Infection following a procedure, superficial incisional surgical site, initial encounter: Secondary | ICD-10-CM | POA: Diagnosis not present

## 2019-09-01 DIAGNOSIS — N1832 Chronic kidney disease, stage 3b: Secondary | ICD-10-CM | POA: Diagnosis not present

## 2019-09-01 DIAGNOSIS — N400 Enlarged prostate without lower urinary tract symptoms: Secondary | ICD-10-CM | POA: Diagnosis not present

## 2019-09-01 DIAGNOSIS — E785 Hyperlipidemia, unspecified: Secondary | ICD-10-CM | POA: Diagnosis not present

## 2019-09-01 DIAGNOSIS — E1169 Type 2 diabetes mellitus with other specified complication: Secondary | ICD-10-CM | POA: Diagnosis not present

## 2019-09-01 DIAGNOSIS — N492 Inflammatory disorders of scrotum: Secondary | ICD-10-CM | POA: Diagnosis not present

## 2019-09-01 DIAGNOSIS — I130 Hypertensive heart and chronic kidney disease with heart failure and stage 1 through stage 4 chronic kidney disease, or unspecified chronic kidney disease: Secondary | ICD-10-CM | POA: Diagnosis not present

## 2019-09-01 DIAGNOSIS — I5032 Chronic diastolic (congestive) heart failure: Secondary | ICD-10-CM | POA: Diagnosis not present

## 2019-09-01 DIAGNOSIS — E1122 Type 2 diabetes mellitus with diabetic chronic kidney disease: Secondary | ICD-10-CM | POA: Diagnosis not present

## 2019-09-08 DIAGNOSIS — N1832 Chronic kidney disease, stage 3b: Secondary | ICD-10-CM | POA: Diagnosis not present

## 2019-09-08 DIAGNOSIS — I130 Hypertensive heart and chronic kidney disease with heart failure and stage 1 through stage 4 chronic kidney disease, or unspecified chronic kidney disease: Secondary | ICD-10-CM | POA: Diagnosis not present

## 2019-09-08 DIAGNOSIS — E1122 Type 2 diabetes mellitus with diabetic chronic kidney disease: Secondary | ICD-10-CM | POA: Diagnosis not present

## 2019-09-08 DIAGNOSIS — N400 Enlarged prostate without lower urinary tract symptoms: Secondary | ICD-10-CM | POA: Diagnosis not present

## 2019-09-08 DIAGNOSIS — N492 Inflammatory disorders of scrotum: Secondary | ICD-10-CM | POA: Diagnosis not present

## 2019-09-08 DIAGNOSIS — T8141XA Infection following a procedure, superficial incisional surgical site, initial encounter: Secondary | ICD-10-CM | POA: Diagnosis not present

## 2019-09-08 DIAGNOSIS — E785 Hyperlipidemia, unspecified: Secondary | ICD-10-CM | POA: Diagnosis not present

## 2019-09-08 DIAGNOSIS — E1169 Type 2 diabetes mellitus with other specified complication: Secondary | ICD-10-CM | POA: Diagnosis not present

## 2019-09-08 DIAGNOSIS — I5032 Chronic diastolic (congestive) heart failure: Secondary | ICD-10-CM | POA: Diagnosis not present

## 2019-09-14 ENCOUNTER — Ambulatory Visit: Payer: Medicare PPO | Admitting: Urology

## 2019-09-23 ENCOUNTER — Ambulatory Visit
Admission: RE | Admit: 2019-09-23 | Discharge: 2019-09-23 | Disposition: A | Discharge status: Home / Self Care | Payer: Medicare PPO | Source: Ambulatory Visit | Attending: Urology | Admitting: Urology

## 2019-09-23 ENCOUNTER — Other Ambulatory Visit: Payer: Self-pay

## 2019-09-23 DIAGNOSIS — I728 Aneurysm of other specified arteries: Secondary | ICD-10-CM | POA: Diagnosis not present

## 2019-09-23 DIAGNOSIS — K8689 Other specified diseases of pancreas: Secondary | ICD-10-CM | POA: Diagnosis not present

## 2019-09-23 DIAGNOSIS — N2889 Other specified disorders of kidney and ureter: Secondary | ICD-10-CM | POA: Insufficient documentation

## 2019-09-23 DIAGNOSIS — K862 Cyst of pancreas: Secondary | ICD-10-CM | POA: Diagnosis not present

## 2019-09-23 DIAGNOSIS — C641 Malignant neoplasm of right kidney, except renal pelvis: Secondary | ICD-10-CM | POA: Diagnosis not present

## 2019-09-23 MED ORDER — GADOBUTROL 1 MMOL/ML IV SOLN
10.0000 mL | Freq: Once | INTRAVENOUS | Status: AC | PRN
  Administered 2019-09-23: 10 mL via INTRAVENOUS

## 2019-09-26 ENCOUNTER — Telehealth: Payer: Self-pay

## 2019-09-26 DIAGNOSIS — E113513 Type 2 diabetes mellitus with proliferative diabetic retinopathy with macular edema, bilateral: Secondary | ICD-10-CM | POA: Diagnosis not present

## 2019-09-26 NOTE — Telephone Encounter (Signed)
Adam [name] MRI results are in.

## 2019-09-30 ENCOUNTER — Other Ambulatory Visit: Payer: Self-pay

## 2019-09-30 ENCOUNTER — Encounter: Payer: Self-pay | Admitting: Urology

## 2019-09-30 ENCOUNTER — Ambulatory Visit (INDEPENDENT_AMBULATORY_CARE_PROVIDER_SITE_OTHER): Payer: Medicare PPO | Admitting: Urology

## 2019-09-30 VITALS — BP 127/74 | HR 83 | Ht 71.0 in | Wt 254.0 lb

## 2019-09-30 DIAGNOSIS — N281 Cyst of kidney, acquired: Secondary | ICD-10-CM

## 2019-09-30 DIAGNOSIS — N2889 Other specified disorders of kidney and ureter: Secondary | ICD-10-CM

## 2019-10-01 ENCOUNTER — Encounter: Payer: Self-pay | Admitting: Urology
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
## 2019-10-20 ENCOUNTER — Other Ambulatory Visit: Payer: Self-pay | Admitting: Internal Medicine

## 2019-10-20 NOTE — Telephone Encounter (Signed)
I think he is just taking NPH. Confirm with him Make sure he has insulin---send whatever he takes if he needs Rx (for a year)

## 2019-10-20 NOTE — Telephone Encounter (Signed)
Humalog not on current medication list.  Please advise.

## 2019-10-21 NOTE — Telephone Encounter (Signed)
Left message for Adam [name] to return my call.

## 2019-10-21 NOTE — Telephone Encounter (Signed)
Spoke with Adam [name].  He states he is back on the Humalog Mix 75/25, 100 units twice a day.  Refills sent as instructed by Dr. Silvio Pate.  Medication list updated.

## 2019-11-08 DIAGNOSIS — E113513 Type 2 diabetes mellitus with proliferative diabetic retinopathy with macular edema, bilateral: Secondary | ICD-10-CM | POA: Diagnosis not present

## 2019-11-08 DIAGNOSIS — E113511 Type 2 diabetes mellitus with proliferative diabetic retinopathy with macular edema, right eye: Secondary | ICD-10-CM | POA: Diagnosis not present

## 2019-11-15 ENCOUNTER — Telehealth: Payer: Self-pay | Admitting: Urology

## 2019-11-15 DIAGNOSIS — N2889 Other specified disorders of kidney and ureter: Secondary | ICD-10-CM

## 2019-11-15 NOTE — Telephone Encounter (Signed)
Refer to my previous office note 09/30/2019.  I did ask Dr. [name] to review his MRI and she was in agreement that surveillance is a good option.  We will plan on a follow-up MRI in late January 2022.  Discussed with patient's wife who is on DPS

## 2019-11-17 DIAGNOSIS — E113513 Type 2 diabetes mellitus with proliferative diabetic retinopathy with macular edema, bilateral: Secondary | ICD-10-CM | POA: Diagnosis not present

## 2019-11-24 DIAGNOSIS — H2000 Unspecified acute and subacute iridocyclitis: Secondary | ICD-10-CM | POA: Diagnosis not present

## 2019-11-29 DIAGNOSIS — H40043 Steroid responder, bilateral: Secondary | ICD-10-CM | POA: Diagnosis not present

## 2019-12-05 DIAGNOSIS — H40043 Steroid responder, bilateral: Secondary | ICD-10-CM | POA: Diagnosis not present

## 2019-12-13 ENCOUNTER — Other Ambulatory Visit: Payer: Self-pay

## 2019-12-13 ENCOUNTER — Ambulatory Visit (INDEPENDENT_AMBULATORY_CARE_PROVIDER_SITE_OTHER): Payer: Medicare PPO | Admitting: Internal Medicine

## 2019-12-13 ENCOUNTER — Encounter: Payer: Self-pay | Admitting: Internal Medicine

## 2019-12-13 VITALS — BP 126/86 | HR 65 | Temp 97.7°F | Ht 71.0 in | Wt 263.0 lb

## 2019-12-13 DIAGNOSIS — I10 Essential (primary) hypertension: Secondary | ICD-10-CM

## 2019-12-13 DIAGNOSIS — Z794 Long term (current) use of insulin: Secondary | ICD-10-CM

## 2019-12-13 DIAGNOSIS — E113411 Type 2 diabetes mellitus with severe nonproliferative diabetic retinopathy with macular edema, right eye: Secondary | ICD-10-CM | POA: Diagnosis not present

## 2019-12-13 DIAGNOSIS — N1832 Chronic kidney disease, stage 3b: Secondary | ICD-10-CM | POA: Diagnosis not present

## 2019-12-13 DIAGNOSIS — I5032 Chronic diastolic (congestive) heart failure: Secondary | ICD-10-CM | POA: Diagnosis not present

## 2019-12-13 DIAGNOSIS — R413 Other amnesia: Secondary | ICD-10-CM | POA: Diagnosis not present

## 2019-12-13 DIAGNOSIS — Z0000 Encounter for general adult medical examination without abnormal findings: Secondary | ICD-10-CM

## 2019-12-13 DIAGNOSIS — E113419 Type 2 diabetes mellitus with severe nonproliferative diabetic retinopathy with macular edema, unspecified eye: Secondary | ICD-10-CM | POA: Insufficient documentation

## 2019-12-13 DIAGNOSIS — Z1211 Encounter for screening for malignant neoplasm of colon: Secondary | ICD-10-CM

## 2019-12-13 DIAGNOSIS — E1165 Type 2 diabetes mellitus with hyperglycemia: Secondary | ICD-10-CM

## 2019-12-13 DIAGNOSIS — N2889 Other specified disorders of kidney and ureter: Secondary | ICD-10-CM | POA: Diagnosis not present

## 2019-12-13 DIAGNOSIS — E1149 Type 2 diabetes mellitus with other diabetic neurological complication: Secondary | ICD-10-CM | POA: Diagnosis not present

## 2019-12-13 DIAGNOSIS — Z23 Encounter for immunization: Secondary | ICD-10-CM

## 2019-12-13 DIAGNOSIS — IMO0002 Reserved for concepts with insufficient information to code with codable children: Secondary | ICD-10-CM

## 2019-12-13 DIAGNOSIS — N2581 Secondary hyperparathyroidism of renal origin: Secondary | ICD-10-CM

## 2019-12-13 LAB — CBC
HCT: 36.3 % — ABNORMAL LOW (ref 39.0–52.0)
Hemoglobin: 11.8 g/dL — ABNORMAL LOW (ref 13.0–17.0)
MCHC: 32.5 g/dL (ref 30.0–36.0)
MCV: 87.3 fl (ref 78.0–100.0)
Platelets: 192 10*3/uL (ref 150.0–400.0)
RBC: 4.16 Mil/uL — ABNORMAL LOW (ref 4.22–5.81)
RDW: 14.9 % (ref 11.5–15.5)
WBC: 7.4 10*3/uL (ref 4.0–10.5)

## 2019-12-13 LAB — LIPID PANEL
Cholesterol: 190 mg/dL (ref 0–200)
HDL: 37.8 mg/dL — ABNORMAL LOW (ref >39.00)
NonHDL: 151.77
Total CHOL/HDL Ratio: 5
Triglycerides: 213 mg/dL — ABNORMAL HIGH (ref 0.0–149.0)
VLDL: 42.6 mg/dL — ABNORMAL HIGH (ref 0.0–40.0)

## 2019-12-13 LAB — RENAL FUNCTION PANEL
Albumin: 3.8 g/dL (ref 3.5–5.2)
BUN: 34 mg/dL — ABNORMAL HIGH (ref 6–23)
CO2: 29 mEq/L (ref 19–32)
Calcium: 9.5 mg/dL (ref 8.4–10.5)
Chloride: 101 mEq/L (ref 96–112)
Creatinine, Ser: 1.81 mg/dL — ABNORMAL HIGH (ref 0.40–1.50)
GFR: 36.81 mL/min — ABNORMAL LOW (ref >60.00)
Glucose, Bld: 287 mg/dL — ABNORMAL HIGH (ref 70–99)
Phosphorus: 3 mg/dL (ref 2.3–4.6)
Potassium: 4.7 mEq/L (ref 3.5–5.1)
Sodium: 134 mEq/L — ABNORMAL LOW (ref 135–145)

## 2019-12-13 LAB — HEPATIC FUNCTION PANEL
ALT: 19 U/L (ref 0–53)
AST: 20 U/L (ref 0–37)
Albumin: 3.8 g/dL (ref 3.5–5.2)
Alkaline Phosphatase: 72 U/L (ref 39–117)
Bilirubin, Direct: 0.1 mg/dL (ref 0.0–0.3)
Total Bilirubin: 0.4 mg/dL (ref 0.2–1.2)
Total Protein: 8.4 g/dL — ABNORMAL HIGH (ref 6.0–8.3)

## 2019-12-13 LAB — T4, FREE: Free T4: 0.82 ng/dL (ref 0.60–1.60)

## 2019-12-13 LAB — HM DIABETES FOOT EXAM

## 2019-12-13 LAB — HEMOGLOBIN A1C: Hgb A1c MFr Bld: 13 % — ABNORMAL HIGH (ref 4.6–6.5)

## 2019-12-13 LAB — VITAMIN D 25 HYDROXY (VIT D DEFICIENCY, FRACTURES): VITD: 9.61 ng/mL — ABNORMAL LOW (ref 30.00–100.00)

## 2019-12-13 LAB — LDL CHOLESTEROL, DIRECT: Direct LDL: 112 mg/dL

## 2019-12-13 LAB — VITAMIN B12: Vitamin B-12: 278 pg/mL (ref 211–911)

## 2019-12-13 MED ORDER — HUMALOG MIX 75/25 (75-25) 100 UNIT/ML ~~LOC~~ SUSP: 50.0000 [IU] | Freq: Two times a day (BID) | SUBCUTANEOUS | 0 refills | Status: DC

## 2019-12-13 NOTE — Assessment & Plan Note (Signed)
Is on lisinopril Will recheck labs

## 2019-12-13 NOTE — Assessment & Plan Note (Signed)
BMI 36 with diabetes, HTN, CHF, etc Discussed diet modification, etc

## 2019-12-13 NOTE — Assessment & Plan Note (Signed)
Seems to have low sugars in AM (but no symptoms) Off metformin due to CKD On 75/25 50 units bid and sitagliptin Will check labs Also on statin

## 2019-12-13 NOTE — Assessment & Plan Note (Signed)
Continues with retina specialist

## 2019-12-13 NOTE — Addendum Note (Signed)
Addended by: Pilar Grammes on: 12/13/2019 12:36 PM   Modules accepted: Orders

## 2019-12-13 NOTE — Assessment & Plan Note (Signed)
Compensated now Uses the furosemide occasionally On lisinopril/HCTZ

## 2019-12-13 NOTE — Assessment & Plan Note (Signed)
I have personally reviewed the Medicare Annual Wellness questionnaire and have noted 1. The patient's medical and social history 2. Their use of alcohol, tobacco or illicit drugs 3. Their current medications and supplements 4. The patient's functional ability including ADL's, fall risks, home safety risks and hearing or visual             impairment. 5. Diet and physical activities 6. Evidence for depression or mood disorders  The patients weight, height, BMI and visual acuity have been recorded in the chart I have made referrals, counseling and provided education to the patient based review of the above and I have provided the pt with a written personalized care plan for preventive services.  I have provided you with a copy of your personalized plan for preventive services. Please take the time to review along with your updated medication list.  COVID booster soon Flu vaccine today Defer PSA---consider one last one next year FIT Discussed fitness

## 2019-12-13 NOTE — Assessment & Plan Note (Signed)
BP Readings from Last 3 Encounters:  12/13/19 126/86  09/30/19 127/74  08/25/19 108/73   Good control on lisinopril HCTZ
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
## 2019-12-13 NOTE — Assessment & Plan Note (Signed)
Some worsening since last year He will check with family Check B12 and thyroid for now---more testing if worsens

## 2019-12-13 NOTE — Assessment & Plan Note (Signed)
Will be on every 6 month surveillance

## 2019-12-14 LAB — PARATHYROID HORMONE, INTACT (NO CA): PTH: 87 pg/mL — ABNORMAL HIGH (ref 14–64)

## 2019-12-15 DIAGNOSIS — N2581 Secondary hyperparathyroidism of renal origin: Secondary | ICD-10-CM | POA: Insufficient documentation

## 2019-12-15 NOTE — Assessment & Plan Note (Signed)
Labs show elevated PTH and low vitamin D Will start vitamin D Have recommended renal consultation

## 2019-12-16 ENCOUNTER — Other Ambulatory Visit (INDEPENDENT_AMBULATORY_CARE_PROVIDER_SITE_OTHER): Payer: Medicare PPO

## 2019-12-16 DIAGNOSIS — Z1211 Encounter for screening for malignant neoplasm of colon: Secondary | ICD-10-CM | POA: Diagnosis not present

## 2019-12-16 LAB — FECAL OCCULT BLOOD, IMMUNOCHEMICAL: Fecal Occult Bld: NEGATIVE

## 2019-12-19 ENCOUNTER — Other Ambulatory Visit: Payer: Self-pay

## 2019-12-19 DIAGNOSIS — E113513 Type 2 diabetes mellitus with proliferative diabetic retinopathy with macular edema, bilateral: Secondary | ICD-10-CM | POA: Diagnosis not present

## 2019-12-31 ENCOUNTER — Other Ambulatory Visit: Payer: Self-pay

## 2019-12-31 MED ORDER — "INSULIN SYRINGE/NEEDLE 28G X 1/2"" 1 ML MISC": 4 refills | Status: DC

## 2020-01-02 NOTE — Telephone Encounter (Signed)
Spouse called checking on rx. Pharmacy stated they have not received anything  Betha Loa number [phone]

## 2020-01-02 NOTE — Telephone Encounter (Signed)
Called [facility] to check on Rx that was sent on 12/31/19. Per pharmacist Rx sent out for delivery today. Tried calling patient's wife to inform no answer LMTB

## 2020-01-09 DIAGNOSIS — H2000 Unspecified acute and subacute iridocyclitis: Secondary | ICD-10-CM | POA: Diagnosis not present

## 2020-01-18 ENCOUNTER — Other Ambulatory Visit: Payer: Self-pay

## 2020-01-18 ENCOUNTER — Encounter: Payer: Self-pay | Admitting: Family Medicine

## 2020-01-18 ENCOUNTER — Encounter: Payer: Self-pay | Admitting: Emergency Medicine

## 2020-01-18 ENCOUNTER — Ambulatory Visit (INDEPENDENT_AMBULATORY_CARE_PROVIDER_SITE_OTHER): Payer: Medicare PPO | Admitting: Family Medicine

## 2020-01-18 ENCOUNTER — Emergency Department
Admission: EM | Admit: 2020-01-18 | Discharge: 2020-01-18 | Disposition: A | Discharge status: Home / Self Care | Payer: Medicare PPO | Attending: Emergency Medicine | Admitting: Emergency Medicine

## 2020-01-18 ENCOUNTER — Emergency Department: Payer: Medicare PPO

## 2020-01-18 VITALS — BP 130/78 | HR 83 | Temp 97.2°F | Ht 71.0 in | Wt 274.2 lb

## 2020-01-18 DIAGNOSIS — R609 Edema, unspecified: Secondary | ICD-10-CM | POA: Diagnosis not present

## 2020-01-18 DIAGNOSIS — E0821 Diabetes mellitus due to underlying condition with diabetic nephropathy: Secondary | ICD-10-CM

## 2020-01-18 DIAGNOSIS — M25421 Effusion, right elbow: Secondary | ICD-10-CM | POA: Diagnosis not present

## 2020-01-18 DIAGNOSIS — M25521 Pain in right elbow: Secondary | ICD-10-CM | POA: Diagnosis not present

## 2020-01-18 DIAGNOSIS — Z794 Long term (current) use of insulin: Secondary | ICD-10-CM | POA: Diagnosis not present

## 2020-01-18 DIAGNOSIS — M8588 Other specified disorders of bone density and structure, other site: Secondary | ICD-10-CM | POA: Diagnosis not present

## 2020-01-18 DIAGNOSIS — M79601 Pain in right arm: Secondary | ICD-10-CM | POA: Diagnosis not present

## 2020-01-18 DIAGNOSIS — R413 Other amnesia: Secondary | ICD-10-CM

## 2020-01-18 DIAGNOSIS — Z79899 Other long term (current) drug therapy: Secondary | ICD-10-CM | POA: Diagnosis not present

## 2020-01-18 DIAGNOSIS — IMO0002 Reserved for concepts with insufficient information to code with codable children: Secondary | ICD-10-CM

## 2020-01-18 DIAGNOSIS — E1122 Type 2 diabetes mellitus with diabetic chronic kidney disease: Secondary | ICD-10-CM | POA: Diagnosis not present

## 2020-01-18 DIAGNOSIS — R600 Localized edema: Secondary | ICD-10-CM

## 2020-01-18 DIAGNOSIS — Z87891 Personal history of nicotine dependence: Secondary | ICD-10-CM | POA: Diagnosis not present

## 2020-01-18 DIAGNOSIS — Z7982 Long term (current) use of aspirin: Secondary | ICD-10-CM | POA: Diagnosis not present

## 2020-01-18 DIAGNOSIS — N1832 Chronic kidney disease, stage 3b: Secondary | ICD-10-CM | POA: Diagnosis not present

## 2020-01-18 DIAGNOSIS — E1149 Type 2 diabetes mellitus with other diabetic neurological complication: Secondary | ICD-10-CM | POA: Insufficient documentation

## 2020-01-18 DIAGNOSIS — I130 Hypertensive heart and chronic kidney disease with heart failure and stage 1 through stage 4 chronic kidney disease, or unspecified chronic kidney disease: Secondary | ICD-10-CM | POA: Diagnosis not present

## 2020-01-18 DIAGNOSIS — E0865 Diabetes mellitus due to underlying condition with hyperglycemia: Secondary | ICD-10-CM

## 2020-01-18 DIAGNOSIS — I5032 Chronic diastolic (congestive) heart failure: Secondary | ICD-10-CM | POA: Insufficient documentation

## 2020-01-18 DIAGNOSIS — N183 Chronic kidney disease, stage 3 unspecified: Secondary | ICD-10-CM

## 2020-01-18 DIAGNOSIS — M7989 Other specified soft tissue disorders: Secondary | ICD-10-CM | POA: Diagnosis not present

## 2020-01-18 LAB — COMPREHENSIVE METABOLIC PANEL
ALT: 23 U/L (ref 0–44)
AST: 22 U/L (ref 15–41)
Albumin: 3.4 g/dL — ABNORMAL LOW (ref 3.5–5.0)
Alkaline Phosphatase: 65 U/L (ref 38–126)
Anion gap: 8 (ref 5–15)
BUN: 35 mg/dL — ABNORMAL HIGH (ref 8–23)
CO2: 25 mmol/L (ref 22–32)
Calcium: 8.9 mg/dL (ref 8.9–10.3)
Chloride: 103 mmol/L (ref 98–111)
Creatinine, Ser: 1.84 mg/dL — ABNORMAL HIGH (ref 0.61–1.24)
GFR, Estimated: 39 mL/min — ABNORMAL LOW (ref >60)
Glucose, Bld: 158 mg/dL — ABNORMAL HIGH (ref 70–99)
Potassium: 3.8 mmol/L (ref 3.5–5.1)
Sodium: 136 mmol/L (ref 135–145)
Total Bilirubin: 0.6 mg/dL (ref 0.3–1.2)
Total Protein: 8.7 g/dL — ABNORMAL HIGH (ref 6.5–8.1)

## 2020-01-18 LAB — CBC WITH DIFFERENTIAL/PLATELET
Abs Immature Granulocytes: 0.02 10*3/uL (ref 0.00–0.07)
Basophils Absolute: 0 10*3/uL (ref 0.0–0.1)
Basophils Relative: 0 %
Eosinophils Absolute: 0.3 10*3/uL (ref 0.0–0.5)
Eosinophils Relative: 2 %
HCT: 36.4 % — ABNORMAL LOW (ref 39.0–52.0)
Hemoglobin: 11.7 g/dL — ABNORMAL LOW (ref 13.0–17.0)
Immature Granulocytes: 0 %
Lymphocytes Relative: 43 %
Lymphs Abs: 4.7 10*3/uL — ABNORMAL HIGH (ref 0.7–4.0)
MCH: 28.2 pg (ref 26.0–34.0)
MCHC: 32.1 g/dL (ref 30.0–36.0)
MCV: 87.7 fL (ref 80.0–100.0)
Monocytes Absolute: 0.9 10*3/uL (ref 0.1–1.0)
Monocytes Relative: 9 %
Neutro Abs: 5.1 10*3/uL (ref 1.7–7.7)
Neutrophils Relative %: 46 %
Platelets: 174 10*3/uL (ref 150–400)
RBC: 4.15 MIL/uL — ABNORMAL LOW (ref 4.22–5.81)
RDW: 14.2 % (ref 11.5–15.5)
WBC: 11.1 10*3/uL — ABNORMAL HIGH (ref 4.0–10.5)
nRBC: 0 % (ref 0.0–0.2)

## 2020-01-18 LAB — GRAM STAIN

## 2020-01-18 LAB — SYNOVIAL CELL COUNT + DIFF, W/ CRYSTALS
Crystals, Fluid: NONE SEEN
Eosinophils-Synovial: 0 %
Lymphocytes-Synovial Fld: 7 %
Monocyte-Macrophage-Synovial Fluid: 5 %
Neutrophil, Synovial: 88 %
WBC, Synovial: 2060 /mm3 — ABNORMAL HIGH (ref 0–200)

## 2020-01-18 LAB — SEDIMENTATION RATE: Sed Rate: 6 mm/hr (ref 0–20)

## 2020-01-18 LAB — LACTIC ACID, PLASMA: Lactic Acid, Venous: 1 mmol/L (ref 0.5–1.9)

## 2020-01-18 MED ORDER — LIDOCAINE-EPINEPHRINE 2 %-1:100000 IJ SOLN
20.0000 mL | Freq: Once | INTRAMUSCULAR | Status: AC
  Administered 2020-01-18: 20 mL
  Filled 2020-01-18: qty 1

## 2020-01-18 MED ORDER — ACETAMINOPHEN 500 MG PO TABS
1000.0000 mg | ORAL_TABLET | Freq: Once | ORAL | Status: AC
  Administered 2020-01-18: 1000 mg via ORAL
  Filled 2020-01-18: qty 2
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
## 2020-01-18 NOTE — ED Notes (Signed)
Pt comes from [place] with c/o right elbow pain. Pt unable to fully extend elbow. MD called ahead and informed this RN that he is concerned pt's elbow may be septic. Pt states swelling.

## 2020-01-18 NOTE — ED Notes (Signed)
Per MD Tamala Julian he will see pt in [place]
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
## 2020-01-18 NOTE — ED Triage Notes (Signed)
Pt comes into the ED via POV where he was sent by [facility] practice where they wanted him to be evaluated for possible infection in the elbow joint.  Pt presents with swelling and heat in the right elbow.  Denies any injury to the elbow or any known history of gout.  Pt in NAD at this time with even and unlabored respirations.

## 2020-01-18 NOTE — ED Provider Notes (Signed)
Patient received in signout from Dr. Tamala Julian pending follow-up with synovial fluid analysis for right elbow pain.  Joint fluid shows no crystals only 2000 WBCs.  Gram stain with no organisms noted.  Lactate normal.  Exam presentation is consistent with an inflammatory arthritis.  No sign of DVT.  No recent trauma.  It would be stable and appropriate for referral for outpatient work-up.  We discussed signs and symptoms for which he should return to the ER.   Merlyn Lot, MD 01/18/20 2214

## 2020-01-19 LAB — C-REACTIVE PROTEIN: CRP: 6.8 mg/dL — ABNORMAL HIGH (ref <1.0)

## 2020-01-21 LAB — URIC ACID, BODY FLUID: Uric Acid Body Fluid: 8.2 mg/dL

## 2020-01-22 DIAGNOSIS — M25421 Effusion, right elbow: Secondary | ICD-10-CM | POA: Diagnosis not present

## 2020-01-22 LAB — BODY FLUID CULTURE: Culture: NO GROWTH

## 2020-01-26 ENCOUNTER — Telehealth: Payer: Self-pay

## 2020-01-26 NOTE — Telephone Encounter (Signed)
Spoke to pt's wife per DPR to see how he was doing after recent ER visit to schedule him an OV. She said the swelling has been going down. He is wearing a compression sleeve and elevating the arm. He has been following up with [facility]. Made him an appt with Dr Silvio Pate 01-31-20 at 930.

## 2020-01-31 ENCOUNTER — Ambulatory Visit: Payer: Medicare PPO | Admitting: Internal Medicine

## 2020-01-31 ENCOUNTER — Ambulatory Visit (INDEPENDENT_AMBULATORY_CARE_PROVIDER_SITE_OTHER): Payer: Medicare PPO | Admitting: Internal Medicine

## 2020-01-31 ENCOUNTER — Encounter: Payer: Self-pay | Admitting: Internal Medicine

## 2020-01-31 ENCOUNTER — Other Ambulatory Visit: Payer: Self-pay

## 2020-01-31 VITALS — BP 126/82 | HR 94 | Temp 97.3°F | Ht 71.0 in | Wt 269.0 lb

## 2020-01-31 DIAGNOSIS — M7711 Lateral epicondylitis, right elbow: Secondary | ICD-10-CM

## 2020-01-31 DIAGNOSIS — N1832 Chronic kidney disease, stage 3b: Secondary | ICD-10-CM | POA: Diagnosis not present

## 2020-01-31 DIAGNOSIS — M7710 Lateral epicondylitis, unspecified elbow: Secondary | ICD-10-CM | POA: Insufficient documentation

## 2020-01-31 DIAGNOSIS — E113513 Type 2 diabetes mellitus with proliferative diabetic retinopathy with macular edema, bilateral: Secondary | ICD-10-CM | POA: Diagnosis not present

## 2020-01-31 NOTE — Patient Instructions (Signed)
Please continue to slowly increase your insulin (back to 100 units twice a day or until your fasting sugars are under 140-150 all the time)
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
## 2020-01-31 NOTE — Assessment & Plan Note (Signed)
Seems to be post traumatic No organisms seen on aspirate Is better though still tender focally---likely a contusion Observe only for now

## 2020-01-31 NOTE — Assessment & Plan Note (Signed)
Awaiting nephrology consult.

## 2020-02-13 DIAGNOSIS — E113513 Type 2 diabetes mellitus with proliferative diabetic retinopathy with macular edema, bilateral: Secondary | ICD-10-CM | POA: Diagnosis not present

## 2020-02-15 ENCOUNTER — Other Ambulatory Visit: Payer: Self-pay | Admitting: Internal Medicine

## 2020-02-23 ENCOUNTER — Other Ambulatory Visit: Payer: Self-pay | Admitting: Nephrology

## 2020-02-23 DIAGNOSIS — N1832 Chronic kidney disease, stage 3b: Secondary | ICD-10-CM

## 2020-02-23 DIAGNOSIS — R809 Proteinuria, unspecified: Secondary | ICD-10-CM

## 2020-02-23 DIAGNOSIS — R8290 Unspecified abnormal findings in urine: Secondary | ICD-10-CM | POA: Diagnosis not present

## 2020-02-23 DIAGNOSIS — I10 Essential (primary) hypertension: Secondary | ICD-10-CM | POA: Diagnosis not present

## 2020-02-23 DIAGNOSIS — D631 Anemia in chronic kidney disease: Secondary | ICD-10-CM | POA: Diagnosis not present

## 2020-02-23 DIAGNOSIS — N189 Chronic kidney disease, unspecified: Secondary | ICD-10-CM

## 2020-02-23 DIAGNOSIS — E1122 Type 2 diabetes mellitus with diabetic chronic kidney disease: Secondary | ICD-10-CM

## 2020-02-29 ENCOUNTER — Encounter: Payer: Self-pay | Admitting: Urology

## 2020-02-29 ENCOUNTER — Ambulatory Visit (INDEPENDENT_AMBULATORY_CARE_PROVIDER_SITE_OTHER): Payer: Medicare PPO | Admitting: Urology

## 2020-02-29 ENCOUNTER — Other Ambulatory Visit: Payer: Self-pay

## 2020-02-29 VITALS — BP 117/73 | HR 66 | Ht 71.0 in | Wt 263.0 lb

## 2020-02-29 DIAGNOSIS — N2889 Other specified disorders of kidney and ureter: Secondary | ICD-10-CM
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
## 2020-03-01 ENCOUNTER — Ambulatory Visit
Admission: RE | Admit: 2020-03-01 | Discharge: 2020-03-01 | Disposition: A | Discharge status: Home / Self Care | Payer: Medicare PPO | Source: Ambulatory Visit | Attending: Nephrology | Admitting: Nephrology

## 2020-03-01 DIAGNOSIS — N189 Chronic kidney disease, unspecified: Secondary | ICD-10-CM | POA: Insufficient documentation

## 2020-03-01 DIAGNOSIS — E1122 Type 2 diabetes mellitus with diabetic chronic kidney disease: Secondary | ICD-10-CM | POA: Insufficient documentation

## 2020-03-01 DIAGNOSIS — R809 Proteinuria, unspecified: Secondary | ICD-10-CM | POA: Diagnosis not present

## 2020-03-01 DIAGNOSIS — D631 Anemia in chronic kidney disease: Secondary | ICD-10-CM | POA: Diagnosis not present

## 2020-03-01 DIAGNOSIS — N281 Cyst of kidney, acquired: Secondary | ICD-10-CM | POA: Diagnosis not present

## 2020-03-01 DIAGNOSIS — N1832 Chronic kidney disease, stage 3b: Secondary | ICD-10-CM | POA: Diagnosis not present

## 2020-03-01 DIAGNOSIS — N2889 Other specified disorders of kidney and ureter: Secondary | ICD-10-CM | POA: Diagnosis not present

## 2020-03-01 DIAGNOSIS — R8290 Unspecified abnormal findings in urine: Secondary | ICD-10-CM | POA: Diagnosis not present

## 2020-03-04 ENCOUNTER — Telehealth: Payer: Self-pay | Admitting: Urology

## 2020-03-04 NOTE — Telephone Encounter (Signed)
Renal ultrasound indicated increase in the size of his right renal mass however before recommending treatment would recommend repeating the MRI as comparison MRI and ultrasound may not be accurate.  Will call with results

## 2020-03-05 NOTE — Telephone Encounter (Signed)
They have been calling him to schedule but he will not call them back.  Sharyn Lull

## 2020-03-07 DIAGNOSIS — H2000 Unspecified acute and subacute iridocyclitis: Secondary | ICD-10-CM | POA: Diagnosis not present

## 2020-03-07 NOTE — Telephone Encounter (Signed)
Scheduling called the patient to schedule his MRI and he said he doesn't think he needs it and he is seeing another provider now. Do you still want him to have it?

## 2020-03-16 ENCOUNTER — Encounter: Payer: Self-pay | Admitting: Urology

## 2020-03-16 NOTE — Telephone Encounter (Signed)
He does need a repeat MRI because based on the ultrasound the mass has grown.  The MRI is recommended to compare the measurements with his last MRI.  I sent a MyChart message recommending to get this scheduled.

## 2020-03-22 DIAGNOSIS — E1122 Type 2 diabetes mellitus with diabetic chronic kidney disease: Secondary | ICD-10-CM | POA: Diagnosis not present

## 2020-03-22 DIAGNOSIS — I10 Essential (primary) hypertension: Secondary | ICD-10-CM | POA: Diagnosis not present

## 2020-03-22 DIAGNOSIS — N1831 Chronic kidney disease, stage 3a: Secondary | ICD-10-CM | POA: Diagnosis not present

## 2020-03-22 DIAGNOSIS — N2889 Other specified disorders of kidney and ureter: Secondary | ICD-10-CM | POA: Diagnosis not present

## 2020-03-24 ENCOUNTER — Other Ambulatory Visit: Payer: Self-pay

## 2020-03-24 ENCOUNTER — Ambulatory Visit
Admission: RE | Admit: 2020-03-24 | Discharge: 2020-03-24 | Disposition: A | Discharge status: Home / Self Care | Payer: Medicare PPO | Source: Ambulatory Visit | Attending: Urology | Admitting: Urology

## 2020-03-24 DIAGNOSIS — N281 Cyst of kidney, acquired: Secondary | ICD-10-CM | POA: Diagnosis not present

## 2020-03-24 DIAGNOSIS — K429 Umbilical hernia without obstruction or gangrene: Secondary | ICD-10-CM | POA: Diagnosis not present

## 2020-03-24 DIAGNOSIS — N2889 Other specified disorders of kidney and ureter: Secondary | ICD-10-CM | POA: Diagnosis not present

## 2020-03-24 DIAGNOSIS — K862 Cyst of pancreas: Secondary | ICD-10-CM | POA: Diagnosis not present

## 2020-03-24 MED ORDER — GADOBUTROL 1 MMOL/ML IV SOLN
10.0000 mL | Freq: Once | INTRAVENOUS | Status: AC | PRN
  Administered 2020-03-24: 10 mL via INTRAVENOUS

## 2020-03-27 ENCOUNTER — Telehealth: Payer: Self-pay | Admitting: *Deleted

## 2020-03-27 DIAGNOSIS — E113513 Type 2 diabetes mellitus with proliferative diabetic retinopathy with macular edema, bilateral: Secondary | ICD-10-CM | POA: Diagnosis not present

## 2020-03-27 NOTE — Telephone Encounter (Signed)
-----   Message from [name], MD sent at 03/27/2020  3:59 PM EST ----- The mass on most recent MRI has increased slightly in size.  Recommend scheduling either a telephone visit or an office visit (depending on his preference) to discuss options.

## 2020-03-27 NOTE — Telephone Encounter (Signed)
Notified patient as instructed,  Patient would like to discuss in office.

## 2020-03-30 ENCOUNTER — Encounter: Payer: Self-pay | Admitting: Urology

## 2020-03-30 ENCOUNTER — Ambulatory Visit (INDEPENDENT_AMBULATORY_CARE_PROVIDER_SITE_OTHER): Payer: Medicare PPO | Admitting: Urology

## 2020-03-30 ENCOUNTER — Other Ambulatory Visit: Payer: Self-pay

## 2020-03-30 VITALS — BP 134/76 | HR 85 | Ht 71.0 in | Wt 266.0 lb

## 2020-03-30 DIAGNOSIS — N2889 Other specified disorders of kidney and ureter: Secondary | ICD-10-CM
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
## 2020-03-31 ENCOUNTER — Other Ambulatory Visit: Payer: Self-pay | Admitting: Internal Medicine

## 2020-04-01 ENCOUNTER — Encounter: Payer: Self-pay | Admitting: Urology

## 2020-04-03 ENCOUNTER — Ambulatory Visit: Payer: Medicare PPO | Admitting: Urology

## 2020-04-03 ENCOUNTER — Other Ambulatory Visit: Payer: Self-pay

## 2020-04-03 VITALS — BP 148/83 | HR 68 | Ht 71.0 in | Wt 266.0 lb

## 2020-04-03 DIAGNOSIS — N2889 Other specified disorders of kidney and ureter: Secondary | ICD-10-CM
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
## 2020-04-04 ENCOUNTER — Other Ambulatory Visit: Payer: Self-pay | Admitting: Radiology

## 2020-04-04 DIAGNOSIS — N2889 Other specified disorders of kidney and ureter: Secondary | ICD-10-CM

## 2020-04-06 ENCOUNTER — Other Ambulatory Visit: Payer: Self-pay

## 2020-04-06 ENCOUNTER — Ambulatory Visit
Admission: RE | Admit: 2020-04-06 | Discharge: 2020-04-06 | Disposition: A | Discharge status: Home / Self Care | Payer: Medicare PPO | Source: Ambulatory Visit | Attending: Urology | Admitting: Urology

## 2020-04-06 DIAGNOSIS — N2889 Other specified disorders of kidney and ureter: Secondary | ICD-10-CM | POA: Diagnosis not present

## 2020-04-06 HISTORY — PX: IR RADIOLOGIST EVAL & MGMT: IMG5224
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
## 2020-04-19 ENCOUNTER — Other Ambulatory Visit (HOSPITAL_COMMUNITY): Payer: Self-pay | Admitting: Interventional Radiology

## 2020-04-19 DIAGNOSIS — N2889 Other specified disorders of kidney and ureter: Secondary | ICD-10-CM

## 2020-04-25 ENCOUNTER — Other Ambulatory Visit: Payer: Self-pay | Admitting: Internal Medicine

## 2020-05-08 NOTE — Progress Notes (Signed)
Sent message, via epic in basket, requesting orders in epic from surgeon.  

## 2020-05-14 ENCOUNTER — Other Ambulatory Visit: Payer: Self-pay | Admitting: Radiology

## 2020-05-17 NOTE — Patient Instructions (Addendum)
DUE TO COVID-19 ONLY ONE VISITOR IS ALLOWED TO COME WITH YOU AND STAY IN THE WAITING ROOM ONLY DURING PRE OP AND PROCEDURE DAY OF SURGERY. THE 1 VISITOR  MAY VISIT WITH YOU AFTER SURGERY IN YOUR PRIVATE ROOM DURING VISITING HOURS ONLY!   YOU NEED TO HAVE A COVID 19 TEST ON__3/26_____ @__10 :30_____, THIS TEST MUST BE DONE BEFORE SURGERY,  COVID TESTING SITE [place] [address], IT IS ON THE RIGHT GOING OUT [address] APPROXIMATELY  2 MINUTES PAST [employer] ON THE RIGHT. ONCE YOUR COVID TEST IS COMPLETED,  PLEASE BEGIN THE QUARANTINE INSTRUCTIONS AS OUTLINED IN YOUR HANDOUT.                Adam [name]    Your procedure is scheduled on: 05/23/20   Report to [facility] Main  Entrance   Report to admitting at  10:00 AM     Call this number if you have problems the morning of surgery [phone]    Remember: Do not eat food or drink liquids :After Midnight.   BRUSH YOUR TEETH MORNING OF SURGERY AND RINSE YOUR MOUTH OUT, NO CHEWING GUM CANDY OR MINTS.     Take these medicines the morning of surgery with A SIP OF WATER: use your eye drops. Bring your mask and tubing to the hospital  How to Manage Your Diabetes Before and After Surgery  Why is it important to control my blood sugar before and after surgery? . Improving blood sugar levels before and after surgery helps healing and can limit problems. . A way of improving blood sugar control is eating a healthy diet by: o  Eating less sugar and carbohydrates o  Increasing activity/exercise o  Talking with your doctor about reaching your blood sugar goals . High blood sugars (greater than 180 mg/dL) can raise your risk of infections and slow your recovery, so you will need to focus on controlling your diabetes during the weeks before surgery. . Make sure that the doctor who takes care of your diabetes knows about your planned surgery including the date and location.  How do I manage my  blood sugar before surgery? . Check your blood sugar at least 4 times a day, starting 2 days before surgery, to make sure that the level is not too high or low. o Check your blood sugar the morning of your surgery when you wake up and every 2 hours until you get to the Short Stay unit. . If your blood sugar is less than 70 mg/dL, you will need to treat for low blood sugar: o Do not take insulin. o Treat a low blood sugar (less than 70 mg/dL) with  cup of clear juice (cranberry or apple), 4 glucose tablets, OR glucose gel. o Recheck blood sugar in 15 minutes after treatment (to make sure it is greater than 70 mg/dL). If your blood sugar is not greater than 70 mg/dL on recheck, call [phone] for further instructions. . Report your blood sugar to the short stay nurse when you get to Short Stay.  . If you are admitted to the hospital after surgery: o Your blood sugar will be checked by the staff and you will probably be given insulin after surgery (instead of oral diabetes medicines) to make sure you have good blood sugar levels. o The goal for blood sugar control after surgery is 80-180 mg/dL.   WHAT DO I DO ABOUT MY DIABETES MEDICATION?  Marland Kitchen Do not  take oral diabetes medicines (pills) the morning of surgery.  . THE NIGHT BEFORE SURGERY, take  70% of your usual dose - 56 units   units of Humalog 75/25      insulin.       . THE MORNING OF SURGERY, take 0  units of   insulin..                                   You may not have any metal on your body including              piercings  Do not wear jewelry, lotions, powders or deodorant               Men may shave face and neck.   Do not bring valuables to the hospital. [place].  Contacts, dentures or bridgework may not be worn into surgery.     Name and phone number of your driver:  Special Instructions: N/A              Please read over the following fact sheets you were  given: _____________________________________________________________________             [facility] - Preparing for Surgery Before surgery, you can play an important role.  Because skin is not sterile, your skin needs to be as free of germs as possible.  You can reduce the number of germs on your skin by washing with CHG (chlorahexidine gluconate) soap before surgery.  CHG is an antiseptic cleaner which kills germs and bonds with the skin to continue killing germs even after washing. Please DO NOT use if you have an allergy to CHG or antibacterial soaps.  If your skin becomes reddened/irritated stop using the CHG and inform your nurse when you arrive at Short Stay. .  You may shave your face/neck. Please follow these instructions carefully:  1.  Shower with CHG Soap the night before surgery and the  morning of Surgery.  2.  If you choose to wash your hair, wash your hair first as usual with your  normal  shampoo.  3.  After you shampoo, rinse your hair and body thoroughly to remove the  shampoo.                                        4.  Use CHG as you would any other liquid soap.  You can apply chg directly  to the skin and wash                       Gently with a scrungie or clean washcloth.  5.  Apply the CHG Soap to your body ONLY FROM THE NECK DOWN.   Do not use on face/ open                           Wound or open sores. Avoid contact with eyes, ears mouth and genitals (private parts).                       Wash face,  Genitals (private parts) with your normal soap.  6.  Wash thoroughly, paying special attention to the area where your surgery  will be performed.  7.  Thoroughly rinse your body with warm water from the neck down.  8.  DO NOT shower/wash with your normal soap after using and rinsing off  the CHG Soap.             9.  Pat yourself dry with a clean towel.            10.  Wear clean pajamas.            11.  Place clean sheets on your bed the night of your first  shower and do not  sleep with pets. Day of Surgery : Do not apply any lotions/deodorants the morning of surgery.  Please wear clean clothes to the hospital/surgery center.  FAILURE TO FOLLOW THESE INSTRUCTIONS MAY RESULT IN THE CANCELLATION OF YOUR SURGERY PATIENT SIGNATURE_________________________________  NURSE SIGNATURE__________________________________  ________________________________________________________________________

## 2020-05-18 ENCOUNTER — Encounter (HOSPITAL_COMMUNITY)
Admission: RE | Admit: 2020-05-18 | Discharge: 2020-05-18 | Disposition: A | Discharge status: Home / Self Care | Payer: Medicare PPO | Source: Ambulatory Visit | Attending: Interventional Radiology | Admitting: Interventional Radiology

## 2020-05-18 ENCOUNTER — Ambulatory Visit (HOSPITAL_COMMUNITY)
Admission: RE | Admit: 2020-05-18 | Discharge: 2020-05-18 | Disposition: A | Discharge status: Home / Self Care | Payer: Medicare PPO | Source: Ambulatory Visit | Attending: Radiology | Admitting: Radiology

## 2020-05-18 ENCOUNTER — Encounter (HOSPITAL_COMMUNITY): Payer: Self-pay

## 2020-05-18 ENCOUNTER — Other Ambulatory Visit: Payer: Self-pay

## 2020-05-18 DIAGNOSIS — Z01818 Encounter for other preprocedural examination: Secondary | ICD-10-CM | POA: Diagnosis not present

## 2020-05-18 HISTORY — DX: Chronic kidney disease, stage 3 unspecified: N18.30

## 2020-05-18 LAB — COMPREHENSIVE METABOLIC PANEL
ALT: 17 U/L (ref 0–44)
AST: 20 U/L (ref 15–41)
Albumin: 3.4 g/dL — ABNORMAL LOW (ref 3.5–5.0)
Alkaline Phosphatase: 60 U/L (ref 38–126)
Anion gap: 8 (ref 5–15)
BUN: 36 mg/dL — ABNORMAL HIGH (ref 8–23)
CO2: 21 mmol/L — ABNORMAL LOW (ref 22–32)
Calcium: 9.1 mg/dL (ref 8.9–10.3)
Chloride: 107 mmol/L (ref 98–111)
Creatinine, Ser: 1.8 mg/dL — ABNORMAL HIGH (ref 0.61–1.24)
GFR, Estimated: 40 mL/min — ABNORMAL LOW (ref >60)
Glucose, Bld: 174 mg/dL — ABNORMAL HIGH (ref 70–99)
Potassium: 4.4 mmol/L (ref 3.5–5.1)
Sodium: 136 mmol/L (ref 135–145)
Total Bilirubin: 0.8 mg/dL (ref 0.3–1.2)
Total Protein: 8.4 g/dL — ABNORMAL HIGH (ref 6.5–8.1)

## 2020-05-18 LAB — HEMOGLOBIN A1C
Hgb A1c MFr Bld: 11.3 % — ABNORMAL HIGH (ref 4.8–5.6)
Mean Plasma Glucose: 277.61 mg/dL

## 2020-05-18 LAB — CBC WITH DIFFERENTIAL/PLATELET
Abs Immature Granulocytes: 0.02 10*3/uL (ref 0.00–0.07)
Basophils Absolute: 0 10*3/uL (ref 0.0–0.1)
Basophils Relative: 1 %
Eosinophils Absolute: 0.2 10*3/uL (ref 0.0–0.5)
Eosinophils Relative: 3 %
HCT: 37.1 % — ABNORMAL LOW (ref 39.0–52.0)
Hemoglobin: 11.9 g/dL — ABNORMAL LOW (ref 13.0–17.0)
Immature Granulocytes: 0 %
Lymphocytes Relative: 37 %
Lymphs Abs: 3 10*3/uL (ref 0.7–4.0)
MCH: 28.4 pg (ref 26.0–34.0)
MCHC: 32.1 g/dL (ref 30.0–36.0)
MCV: 88.5 fL (ref 80.0–100.0)
Monocytes Absolute: 0.8 10*3/uL (ref 0.1–1.0)
Monocytes Relative: 10 %
Neutro Abs: 3.9 10*3/uL (ref 1.7–7.7)
Neutrophils Relative %: 49 %
Platelets: 194 10*3/uL (ref 150–400)
RBC: 4.19 MIL/uL — ABNORMAL LOW (ref 4.22–5.81)
RDW: 14.7 % (ref 11.5–15.5)
WBC: 8 10*3/uL (ref 4.0–10.5)
nRBC: 0 % (ref 0.0–0.2)

## 2020-05-18 LAB — GLUCOSE, CAPILLARY: Glucose-Capillary: 159 mg/dL — ABNORMAL HIGH (ref 70–99)

## 2020-05-18 LAB — PROTIME-INR
INR: 1.1 (ref 0.8–1.2)
Prothrombin Time: 13.4 seconds (ref 11.4–15.2)

## 2020-05-18 NOTE — Progress Notes (Signed)
COVID Vaccine Completed:yes Date COVID Vaccine completed:04/29/19-booster 01/04/20 COVID vaccine manufacturer:    Moderna      PCP - Dr. Wilhemena Durie Cardiologist - Dr. Johnny Bridge 2016 for ECHO  Chest x-ray - no EKG - 07/28/19-epic Stress Test - no ECHO - 2016-epic Cardiac Cath - no Pacemaker/ICD device last checked:NA  Sleep Study - yes CPAP - no -can't tolerate the mask.  Fasting Blood Sugar - 117-232 Checks Blood Sugar QD_____ times a day  Blood Thinner Instructions:ASA81 Aspirin Instructions:stop 3 days prior to DOS/ Adam [name] Last Dose:05/19/20  Anesthesia review:   Patient denies shortness of breath, fever, cough and chest pain at PAT appointment yes  Patient verbalized understanding of instructions that were given to them at the PAT appointment. Patient was also instructed that they will need to review over the PAT instructions again at home before surgery.yes. Pt doesn't climb stairs and feels that he is out of shape but no SOB with ADLs. He reports pain in his rt ankle. There is slight swell and pain to the touch just below the fibula. Daughter thinks it may be gout but pt denies having gout.

## 2020-05-19 ENCOUNTER — Other Ambulatory Visit (HOSPITAL_COMMUNITY)
Admission: RE | Admit: 2020-05-19 | Discharge: 2020-05-19 | Disposition: A | Discharge status: Home / Self Care | Payer: Medicare PPO | Source: Ambulatory Visit | Attending: Interventional Radiology | Admitting: Interventional Radiology

## 2020-05-19 DIAGNOSIS — Z20822 Contact with and (suspected) exposure to covid-19: Secondary | ICD-10-CM | POA: Insufficient documentation

## 2020-05-19 DIAGNOSIS — Z01812 Encounter for preprocedural laboratory examination: Secondary | ICD-10-CM | POA: Diagnosis not present

## 2020-05-19 LAB — SARS CORONAVIRUS 2 (TAT 6-24 HRS): SARS Coronavirus 2: NEGATIVE

## 2020-05-21 ENCOUNTER — Encounter (HOSPITAL_COMMUNITY): Payer: Self-pay

## 2020-05-21 ENCOUNTER — Other Ambulatory Visit: Payer: Self-pay | Admitting: Radiology

## 2020-05-22 ENCOUNTER — Other Ambulatory Visit: Payer: Self-pay | Admitting: Radiology

## 2020-05-23 ENCOUNTER — Encounter (HOSPITAL_COMMUNITY): Payer: Self-pay

## 2020-05-23 ENCOUNTER — Encounter (HOSPITAL_COMMUNITY)
Admission: RE | Disposition: A | Discharge status: Home / Self Care | Payer: Self-pay | Source: Home / Self Care | Attending: Interventional Radiology

## 2020-05-23 ENCOUNTER — Encounter (HOSPITAL_COMMUNITY): Payer: Self-pay | Admitting: Interventional Radiology

## 2020-05-23 ENCOUNTER — Observation Stay (HOSPITAL_COMMUNITY)
Admission: RE | Admit: 2020-05-23 | Discharge: 2020-05-24 | Disposition: A | Discharge status: Home / Self Care | Payer: Medicare PPO | Attending: Interventional Radiology | Admitting: Interventional Radiology

## 2020-05-23 ENCOUNTER — Other Ambulatory Visit: Payer: Self-pay

## 2020-05-23 ENCOUNTER — Observation Stay (HOSPITAL_COMMUNITY)
Admission: RE | Admit: 2020-05-23 | Discharge: 2020-05-23 | Disposition: A | Discharge status: Home / Self Care | Payer: Medicare PPO | Source: Ambulatory Visit | Attending: Interventional Radiology | Admitting: Interventional Radiology

## 2020-05-23 ENCOUNTER — Ambulatory Visit (HOSPITAL_COMMUNITY): Payer: Medicare PPO | Admitting: Registered Nurse

## 2020-05-23 ENCOUNTER — Ambulatory Visit (HOSPITAL_COMMUNITY): Payer: Medicare PPO | Admitting: Physician Assistant

## 2020-05-23 DIAGNOSIS — Z794 Long term (current) use of insulin: Secondary | ICD-10-CM | POA: Diagnosis not present

## 2020-05-23 DIAGNOSIS — Z7982 Long term (current) use of aspirin: Secondary | ICD-10-CM | POA: Insufficient documentation

## 2020-05-23 DIAGNOSIS — Z79899 Other long term (current) drug therapy: Secondary | ICD-10-CM | POA: Insufficient documentation

## 2020-05-23 DIAGNOSIS — N2889 Other specified disorders of kidney and ureter: Secondary | ICD-10-CM

## 2020-05-23 DIAGNOSIS — Z7984 Long term (current) use of oral hypoglycemic drugs: Secondary | ICD-10-CM | POA: Diagnosis not present

## 2020-05-23 DIAGNOSIS — Z8249 Family history of ischemic heart disease and other diseases of the circulatory system: Secondary | ICD-10-CM | POA: Diagnosis not present

## 2020-05-23 DIAGNOSIS — N1832 Chronic kidney disease, stage 3b: Secondary | ICD-10-CM | POA: Diagnosis not present

## 2020-05-23 DIAGNOSIS — Z833 Family history of diabetes mellitus: Secondary | ICD-10-CM | POA: Diagnosis not present

## 2020-05-23 DIAGNOSIS — Z886 Allergy status to analgesic agent status: Secondary | ICD-10-CM | POA: Diagnosis not present

## 2020-05-23 DIAGNOSIS — E785 Hyperlipidemia, unspecified: Secondary | ICD-10-CM | POA: Diagnosis not present

## 2020-05-23 DIAGNOSIS — N183 Chronic kidney disease, stage 3 unspecified: Secondary | ICD-10-CM | POA: Diagnosis not present

## 2020-05-23 DIAGNOSIS — I129 Hypertensive chronic kidney disease with stage 1 through stage 4 chronic kidney disease, or unspecified chronic kidney disease: Secondary | ICD-10-CM | POA: Diagnosis not present

## 2020-05-23 DIAGNOSIS — K219 Gastro-esophageal reflux disease without esophagitis: Secondary | ICD-10-CM | POA: Diagnosis not present

## 2020-05-23 DIAGNOSIS — C641 Malignant neoplasm of right kidney, except renal pelvis: Principal | ICD-10-CM | POA: Insufficient documentation

## 2020-05-23 DIAGNOSIS — I120 Hypertensive chronic kidney disease with stage 5 chronic kidney disease or end stage renal disease: Secondary | ICD-10-CM | POA: Diagnosis not present

## 2020-05-23 DIAGNOSIS — N2881 Hypertrophy of kidney: Secondary | ICD-10-CM | POA: Diagnosis present

## 2020-05-23 DIAGNOSIS — Z881 Allergy status to other antibiotic agents status: Secondary | ICD-10-CM | POA: Diagnosis not present

## 2020-05-23 DIAGNOSIS — Z87891 Personal history of nicotine dependence: Secondary | ICD-10-CM | POA: Diagnosis not present

## 2020-05-23 DIAGNOSIS — E1122 Type 2 diabetes mellitus with diabetic chronic kidney disease: Secondary | ICD-10-CM | POA: Diagnosis not present

## 2020-05-23 DIAGNOSIS — R011 Cardiac murmur, unspecified: Secondary | ICD-10-CM | POA: Insufficient documentation

## 2020-05-23 DIAGNOSIS — Z791 Long term (current) use of non-steroidal anti-inflammatories (NSAID): Secondary | ICD-10-CM | POA: Diagnosis not present

## 2020-05-23 HISTORY — PX: RADIOLOGY WITH ANESTHESIA: SHX6223

## 2020-05-23 LAB — ABO/RH: ABO/RH(D): A POS

## 2020-05-23 LAB — BASIC METABOLIC PANEL
Anion gap: 8 (ref 5–15)
BUN: 38 mg/dL — ABNORMAL HIGH (ref 8–23)
CO2: 23 mmol/L (ref 22–32)
Calcium: 8.8 mg/dL — ABNORMAL LOW (ref 8.9–10.3)
Chloride: 105 mmol/L (ref 98–111)
Creatinine, Ser: 2.04 mg/dL — ABNORMAL HIGH (ref 0.61–1.24)
GFR, Estimated: 34 mL/min — ABNORMAL LOW (ref >60)
Glucose, Bld: 182 mg/dL — ABNORMAL HIGH (ref 70–99)
Potassium: 4.1 mmol/L (ref 3.5–5.1)
Sodium: 136 mmol/L (ref 135–145)

## 2020-05-23 LAB — TYPE AND SCREEN
ABO/RH(D): A POS
Antibody Screen: NEGATIVE

## 2020-05-23 LAB — GLUCOSE, CAPILLARY
Glucose-Capillary: 164 mg/dL — ABNORMAL HIGH (ref 70–99)
Glucose-Capillary: 182 mg/dL — ABNORMAL HIGH (ref 70–99)
Glucose-Capillary: 238 mg/dL — ABNORMAL HIGH (ref 70–99)

## 2020-05-23 SURGERY — CT WITH ANESTHESIA
Anesthesia: General | Laterality: Right

## 2020-05-23 MED ORDER — LISINOPRIL 20 MG PO TABS
20.0000 mg | ORAL_TABLET | Freq: Every day | ORAL | Status: DC
  Administered 2020-05-23 – 2020-05-24 (×2): 20 mg via ORAL
  Filled 2020-05-23 (×2): qty 1

## 2020-05-23 MED ORDER — INSULIN ASPART PROT & ASPART (70-30 MIX) 100 UNIT/ML ~~LOC~~ SUSP
80.0000 [IU] | Freq: Two times a day (BID) | SUBCUTANEOUS | Status: DC
  Administered 2020-05-24: 80 [IU] via SUBCUTANEOUS
  Filled 2020-05-23: qty 10

## 2020-05-23 MED ORDER — FENTANYL CITRATE (PF) 100 MCG/2ML IJ SOLN: 25.0000 ug | INTRAMUSCULAR | Status: DC | PRN

## 2020-05-23 MED ORDER — LISINOPRIL-HYDROCHLOROTHIAZIDE 20-25 MG PO TABS: 1.0000 | ORAL_TABLET | Freq: Every day | ORAL | Status: DC

## 2020-05-23 MED ORDER — LIDOCAINE 2% (20 MG/ML) 5 ML SYRINGE
INTRAMUSCULAR | Status: DC | PRN
  Administered 2020-05-23: 60 mg via INTRAVENOUS

## 2020-05-23 MED ORDER — LACTATED RINGERS IV SOLN: INTRAVENOUS | Status: DC

## 2020-05-23 MED ORDER — LINAGLIPTIN 5 MG PO TABS
5.0000 mg | ORAL_TABLET | Freq: Every day | ORAL | Status: DC
  Administered 2020-05-24: 5 mg via ORAL
  Filled 2020-05-23: qty 1

## 2020-05-23 MED ORDER — DOCUSATE SODIUM 100 MG PO CAPS
100.0000 mg | ORAL_CAPSULE | Freq: Two times a day (BID) | ORAL | Status: DC
  Administered 2020-05-23 – 2020-05-24 (×2): 100 mg via ORAL
  Filled 2020-05-23: qty 1

## 2020-05-23 MED ORDER — SODIUM CHLORIDE 0.9 % IV SOLN
INTRAVENOUS | Status: AC
  Filled 2020-05-23: qty 250

## 2020-05-23 MED ORDER — LOTEPREDNOL ETABONATE 0.5 % OP SUSP
1.0000 [drp] | Freq: Three times a day (TID) | OPHTHALMIC | Status: DC
  Administered 2020-05-23 – 2020-05-24 (×2): 1 [drp] via OPHTHALMIC
  Filled 2020-05-23: qty 5

## 2020-05-23 MED ORDER — HYDROCODONE-ACETAMINOPHEN 5-325 MG PO TABS
1.0000 | ORAL_TABLET | ORAL | Status: DC | PRN
  Administered 2020-05-23 – 2020-05-24 (×2): 2 via ORAL
  Filled 2020-05-23 (×2): qty 2

## 2020-05-23 MED ORDER — TIMOLOL MALEATE 0.5 % OP SOLN
1.0000 [drp] | Freq: Two times a day (BID) | OPHTHALMIC | Status: DC
  Administered 2020-05-23 – 2020-05-24 (×2): 1 [drp] via OPHTHALMIC
  Filled 2020-05-23: qty 5

## 2020-05-23 MED ORDER — VANCOMYCIN HCL IN DEXTROSE 1-5 GM/200ML-% IV SOLN
1000.0000 mg | Freq: Once | INTRAVENOUS | Status: AC
  Administered 2020-05-23: 1000 mg via INTRAVENOUS
  Filled 2020-05-23: qty 200

## 2020-05-23 MED ORDER — ATORVASTATIN CALCIUM 40 MG PO TABS
80.0000 mg | ORAL_TABLET | Freq: Every day | ORAL | Status: DC
  Administered 2020-05-23: 80 mg via ORAL
  Filled 2020-05-23: qty 2

## 2020-05-23 MED ORDER — DEXAMETHASONE SODIUM PHOSPHATE 10 MG/ML IJ SOLN
INTRAMUSCULAR | Status: DC | PRN
  Administered 2020-05-23: 8 mg via INTRAVENOUS

## 2020-05-23 MED ORDER — FENTANYL CITRATE (PF) 100 MCG/2ML IJ SOLN
INTRAMUSCULAR | Status: DC | PRN
  Administered 2020-05-23: 25 ug via INTRAVENOUS
  Administered 2020-05-23: 100 ug via INTRAVENOUS

## 2020-05-23 MED ORDER — BRIMONIDINE TARTRATE 0.2 % OP SOLN
1.0000 [drp] | Freq: Two times a day (BID) | OPHTHALMIC | Status: DC
  Administered 2020-05-23 – 2020-05-24 (×2): 1 [drp] via OPHTHALMIC
  Filled 2020-05-23: qty 5

## 2020-05-23 MED ORDER — HYDROCHLOROTHIAZIDE 12.5 MG PO CAPS
12.5000 mg | ORAL_CAPSULE | Freq: Every day | ORAL | Status: DC
  Administered 2020-05-23 – 2020-05-24 (×2): 12.5 mg via ORAL
  Filled 2020-05-23 (×2): qty 1

## 2020-05-23 MED ORDER — ORAL CARE MOUTH RINSE: 15.0000 mL | Freq: Once | OROMUCOSAL | Status: DC

## 2020-05-23 MED ORDER — BRIMONIDINE TARTRATE-TIMOLOL 0.2-0.5 % OP SOLN
1.0000 [drp] | Freq: Two times a day (BID) | OPHTHALMIC | Status: DC
  Filled 2020-05-23: qty 5

## 2020-05-23 MED ORDER — SENNOSIDES-DOCUSATE SODIUM 8.6-50 MG PO TABS: 1.0000 | ORAL_TABLET | Freq: Every day | ORAL | Status: DC | PRN

## 2020-05-23 MED ORDER — ROCURONIUM BROMIDE 10 MG/ML (PF) SYRINGE
PREFILLED_SYRINGE | INTRAVENOUS | Status: DC | PRN
  Administered 2020-05-23: 20 mg via INTRAVENOUS
  Administered 2020-05-23: 10 mg via INTRAVENOUS
  Administered 2020-05-23: 60 mg via INTRAVENOUS
  Administered 2020-05-23 (×2): 10 mg via INTRAVENOUS

## 2020-05-23 MED ORDER — ONDANSETRON HCL 4 MG/2ML IJ SOLN
INTRAMUSCULAR | Status: DC | PRN
  Administered 2020-05-23: 4 mg via INTRAVENOUS

## 2020-05-23 MED ORDER — FUROSEMIDE 40 MG PO TABS
80.0000 mg | ORAL_TABLET | Freq: Every day | ORAL | Status: DC
  Administered 2020-05-24: 80 mg via ORAL
  Filled 2020-05-23: qty 2

## 2020-05-23 MED ORDER — ACETAMINOPHEN 500 MG PO TABS: 1000.0000 mg | ORAL_TABLET | Freq: Once | ORAL | Status: DC

## 2020-05-23 MED ORDER — SODIUM CHLORIDE 0.9 % IV SOLN
INTRAVENOUS | Status: DC
  Administered 2020-05-24: 50 mL/h via INTRAVENOUS

## 2020-05-23 MED ORDER — FENTANYL CITRATE (PF) 100 MCG/2ML IJ SOLN
INTRAMUSCULAR | Status: AC
  Filled 2020-05-23: qty 4

## 2020-05-23 MED ORDER — INSULIN ASPART 100 UNIT/ML ~~LOC~~ SOLN
0.0000 [IU] | Freq: Three times a day (TID) | SUBCUTANEOUS | Status: DC
  Administered 2020-05-23: 3 [IU] via SUBCUTANEOUS
  Administered 2020-05-24 (×2): 5 [IU] via SUBCUTANEOUS

## 2020-05-23 MED ORDER — ONDANSETRON HCL 4 MG/2ML IJ SOLN: 4.0000 mg | Freq: Four times a day (QID) | INTRAMUSCULAR | Status: DC | PRN

## 2020-05-23 MED ORDER — PROPOFOL 10 MG/ML IV BOLUS
INTRAVENOUS | Status: DC | PRN
  Administered 2020-05-23: 150 mg via INTRAVENOUS

## 2020-05-23 MED ORDER — PHENYLEPHRINE HCL-NACL 20-0.9 MG/250ML-% IV SOLN
INTRAVENOUS | Status: DC | PRN
  Administered 2020-05-23: 20 ug/min via INTRAVENOUS

## 2020-05-23 MED ORDER — PROMETHAZINE HCL 25 MG/ML IJ SOLN: 6.2500 mg | INTRAMUSCULAR | Status: DC | PRN

## 2020-05-23 MED ORDER — CHLORHEXIDINE GLUCONATE 0.12 % MT SOLN
15.0000 mL | Freq: Once | OROMUCOSAL | Status: DC
  Filled 2020-05-23: qty 15

## 2020-05-23 MED ORDER — SUGAMMADEX SODIUM 500 MG/5ML IV SOLN
INTRAVENOUS | Status: DC | PRN
  Administered 2020-05-23: 500 mg via INTRAVENOUS

## 2020-05-23 NOTE — Sedation Documentation (Signed)
Anesthesia in to sedate 

## 2020-05-23 NOTE — Procedures (Signed)
Interventional Radiology Procedure Note  Procedure: CT and US guided cryoablation of right renal mass  Anesthesia: General  Complications: None  Estimated Blood Loss: < 10 mL  Findings: 3.8-4.0 cm right upper pole renal mass biopsy via 17G needle with 18G core biopsy x 1. Cryoablation of renal mass with 3 separate [employer] CX probes.  Plan: PACU recovery followed by overnight observation.  Venetia Night. [name], M.D Pager:  [phone]
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
## 2020-05-23 NOTE — Anesthesia Preprocedure Evaluation (Addendum)
Anesthesia Evaluation  Patient identified by MRN, date of birth, ID band Patient awake    Reviewed: Allergy & Precautions, NPO status , Patient's Chart, lab work & pertinent test results  Airway Mallampati: III  TM Distance: >3 FB Neck ROM: Full    Dental  (+) Dental Advisory Given, Edentulous Upper, Edentulous Lower   Pulmonary sleep apnea , former smoker,    breath sounds clear to auscultation       Cardiovascular hypertension, Pt. on medications  Rhythm:Regular Rate:Normal     Neuro/Psych negative neurological ROS     GI/Hepatic Neg liver ROS, GERD  ,  Endo/Other  diabetes, Type 2, Oral Hypoglycemic Agents, Insulin Dependent  Renal/GU CRFRenal disease     Musculoskeletal  (+) Arthritis ,   Abdominal   Peds  Hematology negative hematology ROS (+)   Anesthesia Other Findings   Reproductive/Obstetrics                            Lab Results  Component Value Date   WBC 8.0 05/18/2020   HGB 11.9 (L) 05/18/2020   HCT 37.1 (L) 05/18/2020   MCV 88.5 05/18/2020   PLT 194 05/18/2020   Lab Results  Component Value Date   CREATININE 2.04 (H) 05/23/2020   BUN 38 (H) 05/23/2020   NA 136 05/23/2020   K 4.1 05/23/2020   CL 105 05/23/2020   CO2 23 05/23/2020    Anesthesia Physical Anesthesia Plan  ASA: III  Anesthesia Plan: General   Post-op Pain Management:    Induction: Intravenous  PONV Risk Score and Plan: 2 and Dexamethasone, Ondansetron and Treatment may vary due to age or medical condition  Airway Management Planned: Oral ETT  Additional Equipment: None  Intra-op Plan:   Post-operative Plan: Extubation in OR  Informed Consent: I have reviewed the patients History and Physical, chart, labs and discussed the procedure including the risks, benefits and alternatives for the proposed anesthesia with the patient or authorized representative who has indicated his/her  understanding and acceptance.     Dental advisory given  Plan Discussed with: CRNA  Anesthesia Plan Comments:         Anesthesia Quick Evaluation

## 2020-05-23 NOTE — Progress Notes (Signed)
  Patient seen on post-procedure rounds while in PACU.  Sleepy, but doing well per Nurse.  Access site on right flank shows no evidence of bleeding.  Plan to discharge tomorrow if he continues to do well.  Murrell Redden PA-C 05/23/2020 4:39 PM

## 2020-05-23 NOTE — Anesthesia Procedure Notes (Signed)
Procedure Name: Intubation Date/Time: 05/23/2020 11:37 AM Performed by: Victoriano Lain, CRNA Pre-anesthesia Checklist: Patient identified, Emergency Drugs available, Suction available, Patient being monitored and Timeout performed Patient Re-evaluated:Patient Re-evaluated prior to induction Oxygen Delivery Method: Circle system utilized Preoxygenation: Pre-oxygenation with 100% oxygen Induction Type: IV induction Ventilation: Mask ventilation without difficulty and Oral airway inserted - appropriate to patient size Laryngoscope Size: Mac and 4 Grade View: Grade I Tube type: Oral Tube size: 7.5 mm Number of attempts: 1 Airway Equipment and Method: Stylet Placement Confirmation: ETT inserted through vocal cords under direct vision,  positive ETCO2 and breath sounds checked- equal and bilateral Secured at: 23 cm Tube secured with: Tape Dental Injury: Teeth and Oropharynx as per pre-operative assessment

## 2020-05-23 NOTE — Transfer of Care (Signed)
Immediate Anesthesia Transfer of Care Note  Patient: Adam [name]  Procedure(s) Performed: RENAL CRYOABALTION (Right )  Patient Location: PACU  Anesthesia Type:General  Level of Consciousness: awake and alert   Airway & Oxygen Therapy: Patient Spontanous Breathing and Patient connected to face mask oxygen  Post-op Assessment: Report given to RN and Post -op Vital signs reviewed and stable  Post vital signs: Reviewed and stable  Last Vitals:  Vitals Value Taken Time  BP    Temp    Pulse 73 05/23/20 1606  Resp 14 05/23/20 1606  SpO2 90 % 05/23/20 1606  Vitals shown include unvalidated device data.  Last Pain: There were no vitals filed for this visit.       Complications: No complications documented.

## 2020-05-23 NOTE — Anesthesia Procedure Notes (Signed)
Date/Time: 05/23/2020 3:58 PM Performed by: Cynda Familia, CRNA Oxygen Delivery Method: Simple face mask Placement Confirmation: positive ETCO2 and breath sounds checked- equal and bilateral Dental Injury: Teeth and Oropharynx as per pre-operative assessment

## 2020-05-24 ENCOUNTER — Encounter (HOSPITAL_COMMUNITY): Payer: Self-pay | Admitting: Interventional Radiology

## 2020-05-24 ENCOUNTER — Other Ambulatory Visit (HOSPITAL_COMMUNITY): Payer: Self-pay | Admitting: Student

## 2020-05-24 DIAGNOSIS — I129 Hypertensive chronic kidney disease with stage 1 through stage 4 chronic kidney disease, or unspecified chronic kidney disease: Secondary | ICD-10-CM | POA: Diagnosis not present

## 2020-05-24 DIAGNOSIS — R011 Cardiac murmur, unspecified: Secondary | ICD-10-CM | POA: Diagnosis not present

## 2020-05-24 DIAGNOSIS — E1122 Type 2 diabetes mellitus with diabetic chronic kidney disease: Secondary | ICD-10-CM | POA: Diagnosis not present

## 2020-05-24 DIAGNOSIS — K219 Gastro-esophageal reflux disease without esophagitis: Secondary | ICD-10-CM | POA: Diagnosis not present

## 2020-05-24 DIAGNOSIS — Z791 Long term (current) use of non-steroidal anti-inflammatories (NSAID): Secondary | ICD-10-CM | POA: Diagnosis not present

## 2020-05-24 DIAGNOSIS — C641 Malignant neoplasm of right kidney, except renal pelvis: Secondary | ICD-10-CM | POA: Diagnosis not present

## 2020-05-24 DIAGNOSIS — N183 Chronic kidney disease, stage 3 unspecified: Secondary | ICD-10-CM | POA: Diagnosis not present

## 2020-05-24 DIAGNOSIS — E785 Hyperlipidemia, unspecified: Secondary | ICD-10-CM | POA: Diagnosis not present

## 2020-05-24 DIAGNOSIS — Z87891 Personal history of nicotine dependence: Secondary | ICD-10-CM | POA: Diagnosis not present

## 2020-05-24 LAB — CBC
HCT: 32.7 % — ABNORMAL LOW (ref 39.0–52.0)
Hemoglobin: 10.3 g/dL — ABNORMAL LOW (ref 13.0–17.0)
MCH: 28.1 pg (ref 26.0–34.0)
MCHC: 31.5 g/dL (ref 30.0–36.0)
MCV: 89.1 fL (ref 80.0–100.0)
Platelets: 183 10*3/uL (ref 150–400)
RBC: 3.67 MIL/uL — ABNORMAL LOW (ref 4.22–5.81)
RDW: 14.7 % (ref 11.5–15.5)
WBC: 10.2 10*3/uL (ref 4.0–10.5)
nRBC: 0 % (ref 0.0–0.2)

## 2020-05-24 LAB — GLUCOSE, CAPILLARY
Glucose-Capillary: 240 mg/dL — ABNORMAL HIGH (ref 70–99)
Glucose-Capillary: 210 mg/dL — ABNORMAL HIGH (ref 70–99)

## 2020-05-24 LAB — BASIC METABOLIC PANEL
Anion gap: 8 (ref 5–15)
BUN: 44 mg/dL — ABNORMAL HIGH (ref 8–23)
CO2: 22 mmol/L (ref 22–32)
Calcium: 8.2 mg/dL — ABNORMAL LOW (ref 8.9–10.3)
Chloride: 104 mmol/L (ref 98–111)
Creatinine, Ser: 2.28 mg/dL — ABNORMAL HIGH (ref 0.61–1.24)
GFR, Estimated: 30 mL/min — ABNORMAL LOW (ref >60)
Glucose, Bld: 252 mg/dL — ABNORMAL HIGH (ref 70–99)
Potassium: 4.8 mmol/L (ref 3.5–5.1)
Sodium: 134 mmol/L — ABNORMAL LOW (ref 135–145)

## 2020-05-24 LAB — SURGICAL PATHOLOGY

## 2020-05-24 NOTE — Progress Notes (Signed)
Assessment unchanged. Pt verbalized understanding, as well as daughter, of dc instructions. Discharged via wc to front entrance.

## 2020-05-24 NOTE — Discharge Instructions (Signed)
Cryoablation of Renal Tumors, Care After  This sheet gives you information about how to care for yourself after your procedure. Your health care provider may also give you more specific instructions. If you have problems or questions, contact your health care provider.  What can I expect after the procedure? After your procedure, it is common to have pain and discomfort in the upper abdomen. You will be given pain medicines to control this. Follow these instructions at home: Incision care Follow instructions from your health care provider about how to take care of your incision. Make sure you:  - Wash your hands with soap and water before you change your bandage (dressing). If soap and water are not available, use hand sanitizer.  - Ok to shower 48 hours following procedure. Recommend showering with bandage on, remove bandage immediately after showering and pat area dry. No further dressing changes needed after this- ensure area remains clean and dry until fully healed.  - No submerging (swimming, bathing) for 7 days post-procedure. Check your incision area every day for signs of infection. Check for: - Redness, swelling, or pain. - Fluid or blood. - Warmth. - Pus or a bad smell. Activity Rest as often as needing during the first few days of recovery. No stooping, bending, or lifting more than 10 pounds for 1 week.  General instructions To prevent or treat constipation while you are taking prescription pain medicine, your health care provider may recommend that you: - Drink enough fluid to keep your urine clear or pale yellow. - Take over-the-counter or prescription medicines. - Eat foods that are high in fiber, such as fresh fruits and vegetables, whole grains, and beans. - Limit foods that are high in fat and processed sugars, such as fried and sweet foods. - Do not use any products that contain nicotine or tobacco, such as cigarettes and e-cigarettes. If you need help quitting, ask  your health care provider. - Keep all follow-up visits as told by your health care provider. This is important- 3-4 week televisit with Dr. Annamaria Boots. Contact a health care provider if: You cannot pass gas. You are unable to have a bowel movement within 3 days. You have a skin rash. Get help right away if: You have a fever. You have severe or lasting pain in your abdomen, shoulder, or back. You have trouble swallowing or breathing. You have severe weakness or dizziness. You have chest pain or shortness of breath.   This information is not intended to replace advice given to you by your health care provider. Make sure you discuss any questions you have with your health care provider.

## 2020-05-24 NOTE — Discharge Summary (Signed)
Patient ID: Adam [name] MRN: [id number] DOB/AGE: Nov 23, 1950 70 y.o.  Admit date: 05/23/2020 Discharge date: 05/24/2020  Supervising Physician: Adam [name]  Patient Status: [facility] - In-pt  Admission Diagnoses: Right renal mass   Discharge Diagnoses:  Active Problems:   Right renal mass   Discharged Condition: stable  Hospital Course:   Patient presented to [facility] IR for an image-guided right renal mass cryoablation with Dr. Kathlene [name] on 05/23/20. Procedure occurred without major complications and patient was transferred to floor in stable condition (VSS, right flank puncture site stable) for overnight observation. No major events occurred overnight.   Patient awake and alert  no complaints at this time. Foley catheter removed this AM without difficulty. Patient denies right flank pain or hematuria. Right flank puncture site stable. Plan to discharge home today and follow-up with Dr. Kathlene [name] for televisit 3-4 weeks after discharge.  Consults: None  Significant Diagnostic Studies: None   Treatments: cryoablation   Discharge Exam: Blood pressure 139/72, pulse 90, temperature 97.8 F (36.6 C), resp. rate 16, height 5' 11"  (1.803 m), weight 285 lb 4.4 oz (129.4 kg), SpO2 99 %.  Physical Examination: Constitutional:      Appearance: Normal appearance.  Cardiovascular:     Rate and Rhythm: Normal rate and regular rhythm.     Pulses: Normal pulses.     Heart sounds: Normal heart sounds.  Pulmonary:     Effort: Pulmonary effort is normal.     Breath sounds: Normal breath sounds.  Abdominal:     General: Abdomen is flat. Bowel sounds are normal.     Palpations: Abdomen is soft.  Skin      Right flank puncture site is unremarkable with no erythema, edema,  tenderness, bleeding or drainage. Trace of old blood noted on the dressing.   Dressing is otherwise clean, dry, and intact. Neurological:     Mental Status: Patient is alert and oriented to person, place, and time.      Disposition: Discharge disposition: 01-Home or Self Care       Discharge Instructions    Call MD for:   Complete by: As directed    Call MD for:  difficulty breathing, headache or visual disturbances   Complete by: As directed    Call MD for:  extreme fatigue   Complete by: As directed    Call MD for:  hives   Complete by: As directed    Call MD for:  persistant dizziness or light-headedness   Complete by: As directed    Call MD for:  persistant nausea and vomiting   Complete by: As directed    Call MD for:  redness, tenderness, or signs of infection (pain, swelling, redness, odor or green/yellow discharge around incision site)   Complete by: As directed    Call MD for:  severe uncontrolled pain   Complete by: As directed    Call MD for:  temperature >100.4   Complete by: As directed    Diet - low sodium heart healthy   Complete by: As directed    Discharge wound care:   Complete by: As directed    Keep the site clean and dry. No submerging (bathing or swimming) for 7 days.   Increase activity slowly   Complete by: As directed    No lifting heavier than 10 pounds for 7 days.   Remove dressing in 24 hours   Complete by: As directed      Allergies as of 05/24/2020  Reactions   Ibuprofen Swelling   LEGS   Cephalexin Rash      Medication List    TAKE these medications   Accu-Chek Aviva Soln   acetaminophen 500 MG tablet Commonly known as: TYLENOL Take 1,000 mg by mouth every 6 (six) hours as needed for moderate pain.   aspirin 81 MG tablet Take 81 mg by mouth daily.   atorvastatin 80 MG tablet Commonly known as: LIPITOR Take 80 mg by mouth at bedtime.   B-D SINGLE USE SWABS REGULAR Pads USE AS DIRECTED FOR GLUCOSE MONITORING   blood glucose meter kit and supplies Kit One Touch Glucometer Verio.   Dispense based on patient and insurance preference. Use up to four times daily as directed. Diagnosis Code E11.65   Combigan 0.2-0.5 % ophthalmic  solution Generic drug: brimonidine-timolol Place 1 drop into both eyes 2 (two) times daily.   furosemide 80 MG tablet Commonly known as: LASIX Take 80 mg by mouth daily.   HumaLOG Mix 75/25 (75-25) 100 UNIT/ML Susp injection Generic drug: insulin lispro protamine-lispro Inject 100 Units into the skin 2 (two) times daily with a meal. What changed:   how much to take  when to take this   INS SYRINGE/NEEDLE 1CC/28G 28G X 1/2" 1 ML Misc Commonly known as: B-D INS SYR MICROFINE 1CC/28G INJECT 100 UNITS SQ AS DIRECTED TWICE DAILY   lisinopril-hydrochlorothiazide 20-25 MG tablet Commonly known as: ZESTORETIC TAKE 1 TABLET EVERY DAY   Lotemax SM 0.38 % Gel Generic drug: Loteprednol Etabonate Place 1 drop into both eyes 3 (three) times daily.   onetouch ultrasoft lancets Use as instructed. Diagnosis E 11.65   OneTouch Verio test strip Generic drug: glucose blood 1 each by Other route 2 (two) times daily before a meal. Dx code E11.65   SINEX REGULAR NA Place 1 spray into the nose at bedtime.   sitaGLIPtin 100 MG tablet Commonly known as: Januvia Take 1 tablet (100 mg total) by mouth daily.   Vitamin D 50 MCG (2000 UT) tablet Take 2,000 Units by mouth daily.            Discharge Care Instructions  (From admission, onward)         Start     Ordered   05/24/20 0000  Discharge wound care:       Comments: Keep the site clean and dry. No submerging (bathing or swimming) for 7 days.   05/24/20 1104          Follow-up Information    Adam Edouard, MD Follow up.   Specialties: Interventional Radiology, Radiology Why: Televisit in 4 weeks with Dr. Kathlene [name]. Our office will call you to set up the appoinment.  Contact information: [address] [place] [address] [phone]                Electronically Signed: Tera Mater, PA-C 05/24/2020, 11:05 AM   I have spent Less Than 30 Minutes discharging Adam [name].

## 2020-05-24 NOTE — Anesthesia Postprocedure Evaluation (Signed)
Anesthesia Post Note  Patient: Adam [name]  Procedure(s) Performed: RENAL CRYOABALTION (Right )     Patient location during evaluation: PACU Anesthesia Type: General Level of consciousness: awake and alert Pain management: pain level controlled Vital Signs Assessment: post-procedure vital signs reviewed and stable Respiratory status: spontaneous breathing, nonlabored ventilation, respiratory function stable and patient connected to nasal cannula oxygen Cardiovascular status: blood pressure returned to baseline and stable Postop Assessment: no apparent nausea or vomiting Anesthetic complications: no   No complications documented.  Last Vitals:  Vitals:   05/23/20 2206 05/23/20 2245  BP: (!) 170/100 139/72  Pulse:  90  Resp:  16  Temp:    SpO2:  99%    Last Pain:  Vitals:   05/24/20 0811  PainSc: 5                  Tiajuana Amass

## 2020-05-28 DIAGNOSIS — E113513 Type 2 diabetes mellitus with proliferative diabetic retinopathy with macular edema, bilateral: Secondary | ICD-10-CM | POA: Diagnosis not present

## 2020-05-28 LAB — HM DIABETES EYE EXAM

## 2020-06-01 ENCOUNTER — Other Ambulatory Visit: Payer: Self-pay | Admitting: Internal Medicine

## 2020-06-12 DIAGNOSIS — H2000 Unspecified acute and subacute iridocyclitis: Secondary | ICD-10-CM | POA: Diagnosis not present

## 2020-06-13 ENCOUNTER — Other Ambulatory Visit: Payer: Self-pay

## 2020-06-13 ENCOUNTER — Ambulatory Visit: Payer: Medicare PPO | Admitting: Internal Medicine

## 2020-06-13 ENCOUNTER — Encounter: Payer: Self-pay | Admitting: Internal Medicine

## 2020-06-13 DIAGNOSIS — E1149 Type 2 diabetes mellitus with other diabetic neurological complication: Secondary | ICD-10-CM | POA: Diagnosis not present

## 2020-06-13 DIAGNOSIS — E1165 Type 2 diabetes mellitus with hyperglycemia: Secondary | ICD-10-CM

## 2020-06-13 DIAGNOSIS — C641 Malignant neoplasm of right kidney, except renal pelvis: Secondary | ICD-10-CM

## 2020-06-13 DIAGNOSIS — I10 Essential (primary) hypertension: Secondary | ICD-10-CM | POA: Diagnosis not present

## 2020-06-13 DIAGNOSIS — I5032 Chronic diastolic (congestive) heart failure: Secondary | ICD-10-CM

## 2020-06-13 DIAGNOSIS — C649 Malignant neoplasm of unspecified kidney, except renal pelvis: Secondary | ICD-10-CM | POA: Insufficient documentation

## 2020-06-13 DIAGNOSIS — IMO0002 Reserved for concepts with insufficient information to code with codable children: Secondary | ICD-10-CM

## 2020-06-13 MED ORDER — SEMAGLUTIDE(0.25 OR 0.5MG/DOS) 2 MG/1.5ML ~~LOC~~ SOPN: 0.2500 mg | PEN_INJECTOR | SUBCUTANEOUS | 11 refills | Status: DC

## 2020-06-13 MED ORDER — HUMALOG MIX 75/25 (75-25) 100 UNIT/ML ~~LOC~~ SUSP: 100.0000 [IU] | Freq: Two times a day (BID) | SUBCUTANEOUS | 5 refills | Status: DC

## 2020-06-13 NOTE — Patient Instructions (Signed)
Please let me know if you have any problems with the new medication---semaglutide (injected once a week). Start with 0.25mg  weekly for 2 weeks, then increase to 0.5mg  weekly.
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
## 2020-06-13 NOTE — Assessment & Plan Note (Signed)
Compensated on furosemide and ACEI

## 2020-06-13 NOTE — Assessment & Plan Note (Signed)
BP Readings from Last 3 Encounters:  06/13/20 122/74  05/23/20 139/72  05/24/20 (!) 143/75   Okay on the lisinopril/HCTZ

## 2020-06-13 NOTE — Assessment & Plan Note (Addendum)
Lab Results  Component Value Date   HGBA1C 11.3 (H) 05/18/2020   Control is really poor again Will increase the insulin back to 100 bid stop the [place] Add semaglutide 0.25-0.5 (and will titrate up at next visit)  May need to add glipizide again (had hypoglycemia before)

## 2020-06-13 NOTE — Assessment & Plan Note (Signed)
Just had cryoablation No other Rx for now

## 2020-06-14 ENCOUNTER — Ambulatory Visit
Admission: RE | Admit: 2020-06-14 | Discharge: 2020-06-14 | Disposition: A | Discharge status: Home / Self Care | Payer: Medicare PPO | Source: Ambulatory Visit | Attending: Student | Admitting: Student

## 2020-06-14 DIAGNOSIS — N2889 Other specified disorders of kidney and ureter: Secondary | ICD-10-CM

## 2020-06-14 DIAGNOSIS — C641 Malignant neoplasm of right kidney, except renal pelvis: Secondary | ICD-10-CM | POA: Diagnosis not present

## 2020-06-14 HISTORY — PX: IR RADIOLOGIST EVAL & MGMT: IMG5224
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
## 2020-06-21 DIAGNOSIS — N2889 Other specified disorders of kidney and ureter: Secondary | ICD-10-CM | POA: Diagnosis not present

## 2020-06-21 DIAGNOSIS — E1122 Type 2 diabetes mellitus with diabetic chronic kidney disease: Secondary | ICD-10-CM | POA: Diagnosis not present

## 2020-06-21 DIAGNOSIS — E663 Overweight: Secondary | ICD-10-CM | POA: Diagnosis not present

## 2020-06-21 DIAGNOSIS — N1832 Chronic kidney disease, stage 3b: Secondary | ICD-10-CM | POA: Diagnosis not present

## 2020-06-21 DIAGNOSIS — D631 Anemia in chronic kidney disease: Secondary | ICD-10-CM | POA: Diagnosis not present

## 2020-06-21 DIAGNOSIS — I10 Essential (primary) hypertension: Secondary | ICD-10-CM | POA: Diagnosis not present

## 2020-06-21 DIAGNOSIS — R809 Proteinuria, unspecified: Secondary | ICD-10-CM | POA: Diagnosis not present

## 2020-06-23 ENCOUNTER — Other Ambulatory Visit: Payer: Self-pay | Admitting: Internal Medicine

## 2020-06-28 DIAGNOSIS — H3322 Serous retinal detachment, left eye: Secondary | ICD-10-CM | POA: Diagnosis not present

## 2020-06-28 DIAGNOSIS — E113599 Type 2 diabetes mellitus with proliferative diabetic retinopathy without macular edema, unspecified eye: Secondary | ICD-10-CM | POA: Diagnosis not present

## 2020-07-12 ENCOUNTER — Telehealth: Payer: Self-pay

## 2020-07-12 ENCOUNTER — Other Ambulatory Visit: Payer: Self-pay | Admitting: Internal Medicine

## 2020-07-12 NOTE — Telephone Encounter (Signed)
Wife called wanting to let you know that pt has been having low BG readings... she has been having pt test fasting in the AM and right before dinner.... yesterday morning fasting BG was 62.... he has been getting readings in the 81s and 60... pt has had a few 120, 112, 108 readings in the evening.... Wife reports that she decided to reduce insulin to 50 units BID x 10 days ago.... she has made adjustments to his diet as well...Marland Kitchen please advise

## 2020-07-12 NOTE — Telephone Encounter (Signed)
Spoke to wife, [name].

## 2020-07-12 NOTE — Telephone Encounter (Signed)
Okay--this happened before and then his control went haywire. If they went down step wise---then 50 bid is okay---but he needs to stick to the proper eating and check his sugars twice a day regularly to make sure they are not getting out of control

## 2020-07-12 NOTE — Telephone Encounter (Signed)
I agree with cutting the dose but the last time they went all the way down to 50 bid--his sugars went way out of control. Hopefully he will continue to eat healthier I would recommend 75 units bid and then adjust further if they stay low still (might even need more than that)

## 2020-07-12 NOTE — Telephone Encounter (Signed)
Pt's wife stated that she wanted to speak with you further as the other person was her daughter

## 2020-07-12 NOTE — Telephone Encounter (Signed)
Spoke to pt's wife. They will try the 75 units 1st and go from there.

## 2020-07-12 NOTE — Telephone Encounter (Addendum)
Actually spoke to wife, [name], she said she spoke to [place] yesterday about the readings. There is no note. Since his last appt here, he is eating better. Checking FBS in am average 80-90. Has been in the 60s a few times. At times as high as 120s. Has had some afternoon readings in the high 50s. Waking up at night with a sweat and feeling weak and having to eat something to raise his blood sugar. He is getting 50 units of insulin in am and pm. Made the change gradually starting 2 weeks ago from 100 units down to 50 where they are now. He has been waiting to take the insulin after he eats in the morning. Will give him 50 units until she hears back from me tomorrow.

## 2020-07-26 DIAGNOSIS — N2581 Secondary hyperparathyroidism of renal origin: Secondary | ICD-10-CM | POA: Diagnosis not present

## 2020-07-26 DIAGNOSIS — I509 Heart failure, unspecified: Secondary | ICD-10-CM | POA: Diagnosis not present

## 2020-07-26 DIAGNOSIS — N1832 Chronic kidney disease, stage 3b: Secondary | ICD-10-CM | POA: Diagnosis not present

## 2020-07-26 DIAGNOSIS — R809 Proteinuria, unspecified: Secondary | ICD-10-CM | POA: Insufficient documentation

## 2020-07-26 DIAGNOSIS — D631 Anemia in chronic kidney disease: Secondary | ICD-10-CM | POA: Diagnosis not present

## 2020-07-26 DIAGNOSIS — E119 Type 2 diabetes mellitus without complications: Secondary | ICD-10-CM | POA: Diagnosis not present

## 2020-07-26 DIAGNOSIS — I10 Essential (primary) hypertension: Secondary | ICD-10-CM | POA: Diagnosis not present

## 2020-07-30 NOTE — Telephone Encounter (Signed)
Pt's wife called stating Ozempic is not being covered, again. It needed a PA. I did it while she was on the phone. Waiting for response back from CoverMyMeds.

## 2020-08-08 ENCOUNTER — Other Ambulatory Visit: Payer: Self-pay | Admitting: Interventional Radiology

## 2020-08-08 DIAGNOSIS — N2889 Other specified disorders of kidney and ureter: Secondary | ICD-10-CM

## 2020-08-09 DIAGNOSIS — E113513 Type 2 diabetes mellitus with proliferative diabetic retinopathy with macular edema, bilateral: Secondary | ICD-10-CM | POA: Diagnosis not present

## 2020-08-24 ENCOUNTER — Ambulatory Visit
Admission: RE | Admit: 2020-08-24 | Discharge: 2020-08-24 | Disposition: A | Discharge status: Home / Self Care | Payer: Medicare PPO | Source: Ambulatory Visit | Attending: Interventional Radiology | Admitting: Interventional Radiology

## 2020-08-24 ENCOUNTER — Other Ambulatory Visit: Payer: Self-pay

## 2020-08-24 DIAGNOSIS — N2889 Other specified disorders of kidney and ureter: Secondary | ICD-10-CM | POA: Insufficient documentation

## 2020-08-24 DIAGNOSIS — C641 Malignant neoplasm of right kidney, except renal pelvis: Secondary | ICD-10-CM | POA: Diagnosis not present

## 2020-08-24 DIAGNOSIS — K862 Cyst of pancreas: Secondary | ICD-10-CM | POA: Diagnosis not present

## 2020-08-24 DIAGNOSIS — I728 Aneurysm of other specified arteries: Secondary | ICD-10-CM | POA: Diagnosis not present

## 2020-08-24 DIAGNOSIS — N281 Cyst of kidney, acquired: Secondary | ICD-10-CM | POA: Diagnosis not present

## 2020-08-24 MED ORDER — GADOBUTROL 1 MMOL/ML IV SOLN
10.0000 mL | Freq: Once | INTRAVENOUS | Status: AC | PRN
  Administered 2020-08-24: 10 mL via INTRAVENOUS

## 2020-08-28 ENCOUNTER — Encounter: Payer: Self-pay | Admitting: *Deleted

## 2020-08-28 ENCOUNTER — Ambulatory Visit
Admission: RE | Admit: 2020-08-28 | Discharge: 2020-08-28 | Disposition: A | Discharge status: Home / Self Care | Payer: Medicare PPO | Source: Ambulatory Visit | Attending: Interventional Radiology | Admitting: Interventional Radiology

## 2020-08-28 ENCOUNTER — Other Ambulatory Visit: Payer: Self-pay

## 2020-08-28 DIAGNOSIS — Z9889 Other specified postprocedural states: Secondary | ICD-10-CM | POA: Diagnosis not present

## 2020-08-28 DIAGNOSIS — N2889 Other specified disorders of kidney and ureter: Secondary | ICD-10-CM

## 2020-08-28 DIAGNOSIS — C641 Malignant neoplasm of right kidney, except renal pelvis: Secondary | ICD-10-CM | POA: Diagnosis not present

## 2020-08-28 HISTORY — PX: IR RADIOLOGIST EVAL & MGMT: IMG5224
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
## 2020-09-12 ENCOUNTER — Ambulatory Visit: Payer: Medicare PPO | Admitting: Internal Medicine

## 2020-09-13 DIAGNOSIS — E119 Type 2 diabetes mellitus without complications: Secondary | ICD-10-CM | POA: Diagnosis not present

## 2020-09-13 DIAGNOSIS — I10 Essential (primary) hypertension: Secondary | ICD-10-CM | POA: Diagnosis not present

## 2020-09-13 DIAGNOSIS — E162 Hypoglycemia, unspecified: Secondary | ICD-10-CM | POA: Diagnosis not present

## 2020-09-13 DIAGNOSIS — I509 Heart failure, unspecified: Secondary | ICD-10-CM | POA: Diagnosis not present

## 2020-09-13 DIAGNOSIS — N2581 Secondary hyperparathyroidism of renal origin: Secondary | ICD-10-CM | POA: Diagnosis not present

## 2020-09-13 DIAGNOSIS — N1832 Chronic kidney disease, stage 3b: Secondary | ICD-10-CM | POA: Diagnosis not present

## 2020-09-13 DIAGNOSIS — D631 Anemia in chronic kidney disease: Secondary | ICD-10-CM | POA: Diagnosis not present

## 2020-09-13 DIAGNOSIS — R809 Proteinuria, unspecified: Secondary | ICD-10-CM | POA: Diagnosis not present

## 2020-09-20 DIAGNOSIS — E113513 Type 2 diabetes mellitus with proliferative diabetic retinopathy with macular edema, bilateral: Secondary | ICD-10-CM | POA: Diagnosis not present

## 2020-09-27 ENCOUNTER — Telehealth: Payer: Self-pay

## 2020-09-27 MED ORDER — TRULICITY 3 MG/0.5ML ~~LOC~~ SOAJ: 3.0000 mg | SUBCUTANEOUS | 5 refills | Status: DC

## 2020-09-27 NOTE — Telephone Encounter (Signed)
Received a fax from [place] stating Ozempic  '2mg'$ /1.77m is on backorder. When looking, all semaglutide injectables are on backorder. Pharmacy asking what needs to be done for the patient.

## 2020-09-27 NOTE — Telephone Encounter (Signed)
Rx sent electronically.  

## 2020-10-10 ENCOUNTER — Ambulatory Visit: Payer: Medicare PPO | Admitting: Internal Medicine

## 2020-10-10 ENCOUNTER — Encounter: Payer: Self-pay | Admitting: Internal Medicine

## 2020-10-10 ENCOUNTER — Other Ambulatory Visit: Payer: Self-pay

## 2020-10-10 DIAGNOSIS — E1165 Type 2 diabetes mellitus with hyperglycemia: Secondary | ICD-10-CM

## 2020-10-10 DIAGNOSIS — E1149 Type 2 diabetes mellitus with other diabetic neurological complication: Secondary | ICD-10-CM | POA: Diagnosis not present

## 2020-10-10 DIAGNOSIS — N1832 Chronic kidney disease, stage 3b: Secondary | ICD-10-CM | POA: Diagnosis not present

## 2020-10-10 DIAGNOSIS — I5032 Chronic diastolic (congestive) heart failure: Secondary | ICD-10-CM

## 2020-10-10 DIAGNOSIS — IMO0002 Reserved for concepts with insufficient information to code with codable children: Secondary | ICD-10-CM
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
## 2020-10-10 NOTE — Assessment & Plan Note (Signed)
Stable creatinine on recent visit On lisinopril

## 2020-10-10 NOTE — Assessment & Plan Note (Addendum)
Last A1c 9.1 at nephrologist----down from 11.3 Hasn't lost much weight on trulicity but [id number] much better (also back to better lifestyle again) Also on insulin No sig foot pain Remains on atorvastatin as well

## 2020-10-10 NOTE — Assessment & Plan Note (Signed)
This is compensated on ACEI and furosemide

## 2020-10-26 DIAGNOSIS — I130 Hypertensive heart and chronic kidney disease with heart failure and stage 1 through stage 4 chronic kidney disease, or unspecified chronic kidney disease: Secondary | ICD-10-CM | POA: Diagnosis not present

## 2020-10-26 DIAGNOSIS — H409 Unspecified glaucoma: Secondary | ICD-10-CM | POA: Diagnosis not present

## 2020-10-26 DIAGNOSIS — Z6841 Body Mass Index (BMI) 40.0 and over, adult: Secondary | ICD-10-CM | POA: Diagnosis not present

## 2020-10-26 DIAGNOSIS — I509 Heart failure, unspecified: Secondary | ICD-10-CM | POA: Diagnosis not present

## 2020-10-26 DIAGNOSIS — G4730 Sleep apnea, unspecified: Secondary | ICD-10-CM | POA: Diagnosis not present

## 2020-10-26 DIAGNOSIS — Z794 Long term (current) use of insulin: Secondary | ICD-10-CM | POA: Diagnosis not present

## 2020-10-26 DIAGNOSIS — E785 Hyperlipidemia, unspecified: Secondary | ICD-10-CM | POA: Diagnosis not present

## 2020-10-26 DIAGNOSIS — E1122 Type 2 diabetes mellitus with diabetic chronic kidney disease: Secondary | ICD-10-CM | POA: Diagnosis not present

## 2020-11-23 DIAGNOSIS — H4311 Vitreous hemorrhage, right eye: Secondary | ICD-10-CM | POA: Diagnosis not present

## 2020-12-14 ENCOUNTER — Ambulatory Visit (INDEPENDENT_AMBULATORY_CARE_PROVIDER_SITE_OTHER): Payer: Medicare PPO | Admitting: Internal Medicine

## 2020-12-14 ENCOUNTER — Encounter: Payer: Self-pay | Admitting: Internal Medicine

## 2020-12-14 ENCOUNTER — Other Ambulatory Visit: Payer: Self-pay

## 2020-12-14 VITALS — BP 134/90 | HR 56 | Temp 96.9°F | Ht 71.0 in | Wt 272.0 lb

## 2020-12-14 DIAGNOSIS — N2581 Secondary hyperparathyroidism of renal origin: Secondary | ICD-10-CM

## 2020-12-14 DIAGNOSIS — Z125 Encounter for screening for malignant neoplasm of prostate: Secondary | ICD-10-CM | POA: Diagnosis not present

## 2020-12-14 DIAGNOSIS — E113411 Type 2 diabetes mellitus with severe nonproliferative diabetic retinopathy with macular edema, right eye: Secondary | ICD-10-CM | POA: Diagnosis not present

## 2020-12-14 DIAGNOSIS — I700 Atherosclerosis of aorta: Secondary | ICD-10-CM | POA: Insufficient documentation

## 2020-12-14 DIAGNOSIS — I10 Essential (primary) hypertension: Secondary | ICD-10-CM | POA: Diagnosis not present

## 2020-12-14 DIAGNOSIS — Z23 Encounter for immunization: Secondary | ICD-10-CM

## 2020-12-14 DIAGNOSIS — Z794 Long term (current) use of insulin: Secondary | ICD-10-CM | POA: Diagnosis not present

## 2020-12-14 DIAGNOSIS — Z1211 Encounter for screening for malignant neoplasm of colon: Secondary | ICD-10-CM

## 2020-12-14 DIAGNOSIS — Z0000 Encounter for general adult medical examination without abnormal findings: Secondary | ICD-10-CM | POA: Diagnosis not present

## 2020-12-14 DIAGNOSIS — I5032 Chronic diastolic (congestive) heart failure: Secondary | ICD-10-CM

## 2020-12-14 DIAGNOSIS — N1832 Chronic kidney disease, stage 3b: Secondary | ICD-10-CM | POA: Diagnosis not present

## 2020-12-14 LAB — CBC
HCT: 36.7 % — ABNORMAL LOW (ref 39.0–52.0)
Hemoglobin: 11.9 g/dL — ABNORMAL LOW (ref 13.0–17.0)
MCHC: 32.3 g/dL (ref 30.0–36.0)
MCV: 88.4 fl (ref 78.0–100.0)
Platelets: 195 10*3/uL (ref 150.0–400.0)
RBC: 4.15 Mil/uL — ABNORMAL LOW (ref 4.22–5.81)
RDW: 15 % (ref 11.5–15.5)
WBC: 6.1 10*3/uL (ref 4.0–10.5)

## 2020-12-14 LAB — HEPATIC FUNCTION PANEL
ALT: 17 U/L (ref 0–53)
AST: 18 U/L (ref 0–37)
Albumin: 3.8 g/dL (ref 3.5–5.2)
Alkaline Phosphatase: 63 U/L (ref 39–117)
Bilirubin, Direct: 0.1 mg/dL (ref 0.0–0.3)
Total Bilirubin: 0.4 mg/dL (ref 0.2–1.2)
Total Protein: 8.3 g/dL (ref 6.0–8.3)

## 2020-12-14 LAB — RENAL FUNCTION PANEL
Albumin: 3.8 g/dL (ref 3.5–5.2)
BUN: 41 mg/dL — ABNORMAL HIGH (ref 6–23)
CO2: 27 mEq/L (ref 19–32)
Calcium: 9.3 mg/dL (ref 8.4–10.5)
Chloride: 105 mEq/L (ref 96–112)
Creatinine, Ser: 1.86 mg/dL — ABNORMAL HIGH (ref 0.40–1.50)
GFR: 35.85 mL/min — ABNORMAL LOW (ref >60.00)
Glucose, Bld: 88 mg/dL (ref 70–99)
Phosphorus: 3.9 mg/dL (ref 2.3–4.6)
Potassium: 4 mEq/L (ref 3.5–5.1)
Sodium: 139 mEq/L (ref 135–145)

## 2020-12-14 LAB — PSA, MEDICARE: PSA: 4.21 ng/ml — ABNORMAL HIGH (ref 0.10–4.00)

## 2020-12-14 LAB — LIPID PANEL
Cholesterol: 164 mg/dL (ref 0–200)
HDL: 34.6 mg/dL — ABNORMAL LOW (ref >39.00)
LDL Cholesterol: 102 mg/dL — ABNORMAL HIGH (ref 0–99)
NonHDL: 129.7
Total CHOL/HDL Ratio: 5
Triglycerides: 140 mg/dL (ref 0.0–149.0)
VLDL: 28 mg/dL (ref 0.0–40.0)

## 2020-12-14 LAB — VITAMIN D 25 HYDROXY (VIT D DEFICIENCY, FRACTURES): VITD: 22.48 ng/mL — ABNORMAL LOW (ref 30.00–100.00)

## 2020-12-14 LAB — HEMOGLOBIN A1C: Hgb A1c MFr Bld: 9.7 % — ABNORMAL HIGH (ref 4.6–6.5)

## 2020-12-14 LAB — HM DIABETES FOOT EXAM

## 2020-12-14 NOTE — Assessment & Plan Note (Signed)
Seems to be compensated on every other day furosemide Is on lisinopril also
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
## 2020-12-14 NOTE — Assessment & Plan Note (Signed)
Is on vitamin D 

## 2020-12-14 NOTE — Assessment & Plan Note (Signed)
Stable BMI at 37 Has CHF, DM, HTN, etc Discussed exercise and healthy eating

## 2020-12-14 NOTE — Assessment & Plan Note (Signed)
I have personally reviewed the Medicare Annual Wellness questionnaire and have noted 1. The patient's medical and social history 2. Their use of alcohol, tobacco or illicit drugs 3. Their current medications and supplements 4. The patient's functional ability including ADL's, fall risks, home safety risks and hearing or visual             impairment. 5. Diet and physical activities 6. Evidence for depression or mood disorders  The patients weight, height, BMI and visual acuity have been recorded in the chart I have made referrals, counseling and provided education to the patient based review of the above and I have provided the pt with a written personalized care plan for preventive services.  I have provided you with a copy of your personalized plan for preventive services. Please take the time to review along with your updated medication list.  Flu vaccine today He will go to [facility] for bivalent COVID vaccine Discussed resistance exercise Will check one last PSA FIT

## 2020-12-14 NOTE — Assessment & Plan Note (Signed)
Is on lisinopril Sees nephrology

## 2020-12-14 NOTE — Addendum Note (Signed)
Addended by: Pilar Grammes on: 12/14/2020 12:12 PM   Modules accepted: Orders

## 2020-12-14 NOTE — Assessment & Plan Note (Signed)
BP Readings from Last 3 Encounters:  12/14/20 134/90  10/10/20 128/84  06/13/20 122/74   Good control on lisinopril/HCTZ

## 2020-12-14 NOTE — Progress Notes (Signed)
Hearing Screening   500Hz  1000Hz  2000Hz  4000Hz   Right ear 20 20 20 20   Left ear 20 20 20 20   Vision Screening - Comments:: April 2022

## 2020-12-14 NOTE — Assessment & Plan Note (Signed)
Seems to have good control For him--would not change things if close to 9% On 75/25 insulin bid and trulicity Severe retinopathy

## 2020-12-14 NOTE — Assessment & Plan Note (Signed)
On imaging Is on statin, ASA

## 2020-12-17 LAB — PARATHYROID HORMONE, INTACT (NO CA): PTH: 75 pg/mL (ref 16–77)

## 2020-12-17 LAB — EXTRA SPECIMEN

## 2020-12-27 DIAGNOSIS — I10 Essential (primary) hypertension: Secondary | ICD-10-CM | POA: Diagnosis not present

## 2020-12-27 DIAGNOSIS — E119 Type 2 diabetes mellitus without complications: Secondary | ICD-10-CM | POA: Diagnosis not present

## 2020-12-27 DIAGNOSIS — D631 Anemia in chronic kidney disease: Secondary | ICD-10-CM | POA: Diagnosis not present

## 2020-12-27 DIAGNOSIS — I509 Heart failure, unspecified: Secondary | ICD-10-CM | POA: Diagnosis not present

## 2020-12-27 DIAGNOSIS — E161 Other hypoglycemia: Secondary | ICD-10-CM | POA: Diagnosis not present

## 2020-12-27 DIAGNOSIS — E113493 Type 2 diabetes mellitus with severe nonproliferative diabetic retinopathy without macular edema, bilateral: Secondary | ICD-10-CM | POA: Diagnosis not present

## 2020-12-27 DIAGNOSIS — N2581 Secondary hyperparathyroidism of renal origin: Secondary | ICD-10-CM | POA: Diagnosis not present

## 2020-12-27 DIAGNOSIS — R809 Proteinuria, unspecified: Secondary | ICD-10-CM | POA: Diagnosis not present

## 2020-12-27 DIAGNOSIS — N1832 Chronic kidney disease, stage 3b: Secondary | ICD-10-CM | POA: Diagnosis not present

## 2020-12-31 DIAGNOSIS — H4311 Vitreous hemorrhage, right eye: Secondary | ICD-10-CM | POA: Diagnosis not present

## 2020-12-31 DIAGNOSIS — E113513 Type 2 diabetes mellitus with proliferative diabetic retinopathy with macular edema, bilateral: Secondary | ICD-10-CM | POA: Diagnosis not present

## 2021-01-01 ENCOUNTER — Other Ambulatory Visit (INDEPENDENT_AMBULATORY_CARE_PROVIDER_SITE_OTHER): Payer: Medicare PPO

## 2021-01-01 DIAGNOSIS — Z1211 Encounter for screening for malignant neoplasm of colon: Secondary | ICD-10-CM

## 2021-01-01 LAB — FECAL OCCULT BLOOD, IMMUNOCHEMICAL: Fecal Occult Bld: NEGATIVE

## 2021-02-11 DIAGNOSIS — H35351 Cystoid macular degeneration, right eye: Secondary | ICD-10-CM | POA: Diagnosis not present

## 2021-02-21 ENCOUNTER — Other Ambulatory Visit: Payer: Self-pay

## 2021-02-21 ENCOUNTER — Encounter: Payer: Self-pay | Admitting: Family Medicine

## 2021-02-21 ENCOUNTER — Ambulatory Visit: Payer: Medicare PPO | Admitting: Family Medicine

## 2021-02-21 VITALS — BP 124/78 | HR 71 | Temp 97.9°F | Ht 71.0 in | Wt 274.4 lb

## 2021-02-21 DIAGNOSIS — H9311 Tinnitus, right ear: Secondary | ICD-10-CM | POA: Diagnosis not present

## 2021-02-21 DIAGNOSIS — H9319 Tinnitus, unspecified ear: Secondary | ICD-10-CM | POA: Insufficient documentation

## 2021-02-21 DIAGNOSIS — H6980 Other specified disorders of Eustachian tube, unspecified ear: Secondary | ICD-10-CM | POA: Insufficient documentation

## 2021-02-21 DIAGNOSIS — H6981 Other specified disorders of Eustachian tube, right ear: Secondary | ICD-10-CM | POA: Diagnosis not present

## 2021-02-21 DIAGNOSIS — H6990 Unspecified Eustachian tube disorder, unspecified ear: Secondary | ICD-10-CM | POA: Insufficient documentation

## 2021-02-21 MED ORDER — FLUTICASONE PROPIONATE 50 MCG/ACT NA SUSP: 2.0000 | Freq: Every day | NASAL | 1 refills | Status: DC

## 2021-02-21 NOTE — Assessment & Plan Note (Signed)
Right side/small effusion  Causing hearing loss and tinnitus  Pt uses vaso constricting ns daily for years for chronic congestion - suspect significant rebound congestion  Will stop the sinex and start flonase daily  Saline and mucinex prn  Suspect this may get worse before it improves inst to call in 2 wk if not improved (consider ENT eval)  Would like to avoid prednisone if possible 2nd to DM2 inst to call if ear pain as well

## 2021-02-21 NOTE — Assessment & Plan Note (Signed)
Suspect tinnitus is from ETD with small effusion on the R  He uses sinex ns and most likely has significant rebound congestion  Will tx with flonase and close f/u  Other potential causes incl age related hearing loss and medications (ace/lasix) If not improved with tx of ETD would consider ENT ref

## 2021-02-21 NOTE — Patient Instructions (Signed)
It think your tinnitus /ear symptoms may be from eustachian tube dysfunction  Stop the sinex nasal spray (you will be congested for a while)  Nasal saline is ok  Start flonase 2 sprays in each nostril daily  Plain mucinex is fine   It may take 2 weeks or so to improve  In 2 weeks if not improving please let us know  If worse/or if ear pain let us know
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
## 2021-03-06 ENCOUNTER — Other Ambulatory Visit: Payer: Self-pay | Admitting: Internal Medicine

## 2021-03-08 ENCOUNTER — Other Ambulatory Visit: Payer: Self-pay | Admitting: Internal Medicine

## 2021-03-25 DIAGNOSIS — H35351 Cystoid macular degeneration, right eye: Secondary | ICD-10-CM | POA: Diagnosis not present

## 2021-04-03 DIAGNOSIS — E1122 Type 2 diabetes mellitus with diabetic chronic kidney disease: Secondary | ICD-10-CM | POA: Diagnosis not present

## 2021-04-03 DIAGNOSIS — R809 Proteinuria, unspecified: Secondary | ICD-10-CM | POA: Diagnosis not present

## 2021-04-03 DIAGNOSIS — N2581 Secondary hyperparathyroidism of renal origin: Secondary | ICD-10-CM | POA: Diagnosis not present

## 2021-04-03 DIAGNOSIS — I10 Essential (primary) hypertension: Secondary | ICD-10-CM | POA: Diagnosis not present

## 2021-04-03 DIAGNOSIS — N1832 Chronic kidney disease, stage 3b: Secondary | ICD-10-CM | POA: Diagnosis not present

## 2021-04-03 DIAGNOSIS — I509 Heart failure, unspecified: Secondary | ICD-10-CM | POA: Diagnosis not present

## 2021-04-03 DIAGNOSIS — D631 Anemia in chronic kidney disease: Secondary | ICD-10-CM | POA: Diagnosis not present

## 2021-04-05 ENCOUNTER — Telehealth: Payer: Self-pay

## 2021-04-05 DIAGNOSIS — H209 Unspecified iridocyclitis: Secondary | ICD-10-CM | POA: Diagnosis not present

## 2021-04-05 DIAGNOSIS — E113513 Type 2 diabetes mellitus with proliferative diabetic retinopathy with macular edema, bilateral: Secondary | ICD-10-CM | POA: Diagnosis not present

## 2021-04-05 NOTE — Telephone Encounter (Signed)
[facility] Night - Client Nonclinical Telephone Record  [facility] Client [name] Night - Client Client Site [name] - Night Provider Viviana Simpler- MD Contact Type Call Who Is Calling Patient / Member / Family / Caregiver Caller Name [place] Phone Number [phone] Patient Name Adam [name] Patient DOB 1950-05-27 Call Type Message Only Information Provided Reason for Call Medication Question / Request Initial Comment Caller states her husband is needing to get a refill for Trulicity for his dosage due to back orders . She is wanting to know if the office has samples they can get Additional Comment Provided office hours Disp. Time Disposition Final User 04/04/2021 5:05:03 PM General Information Provided Yes Vinnie Level Call Closed By: Vinnie Level Transaction Date/Time: 04/04/2021 5:00:27 PM (ET

## 2021-04-05 NOTE — Telephone Encounter (Signed)
Spoke to pt's wife. She said she had the rx transferred from [facility] to [facility] because they had some in stock. I advised her we do not carry samples.

## 2021-04-10 ENCOUNTER — Telehealth: Payer: Self-pay | Admitting: Internal Medicine

## 2021-04-10 MED ORDER — TRULICITY 3 MG/0.5ML ~~LOC~~ SOAJ: 3.0000 mg | SUBCUTANEOUS | 4 refills | Status: DC

## 2021-04-10 NOTE — Telephone Encounter (Signed)
[facility] called to request TRULICITY 3 OQ/9.4TM SOPN to be filled

## 2021-04-10 NOTE — Telephone Encounter (Signed)
Rx sent to [facility].  

## 2021-04-26 ENCOUNTER — Other Ambulatory Visit: Payer: Self-pay | Admitting: Internal Medicine

## 2021-05-01 DIAGNOSIS — H4321 Crystalline deposits in vitreous body, right eye: Secondary | ICD-10-CM | POA: Diagnosis not present

## 2021-05-01 DIAGNOSIS — H209 Unspecified iridocyclitis: Secondary | ICD-10-CM | POA: Diagnosis not present

## 2021-05-01 DIAGNOSIS — E113513 Type 2 diabetes mellitus with proliferative diabetic retinopathy with macular edema, bilateral: Secondary | ICD-10-CM | POA: Diagnosis not present

## 2021-05-01 DIAGNOSIS — H43393 Other vitreous opacities, bilateral: Secondary | ICD-10-CM | POA: Diagnosis not present

## 2021-05-01 DIAGNOSIS — H40053 Ocular hypertension, bilateral: Secondary | ICD-10-CM | POA: Diagnosis not present

## 2021-05-12 DIAGNOSIS — Z79899 Other long term (current) drug therapy: Secondary | ICD-10-CM | POA: Diagnosis not present

## 2021-05-12 DIAGNOSIS — I771 Stricture of artery: Secondary | ICD-10-CM | POA: Diagnosis not present

## 2021-05-12 DIAGNOSIS — E11319 Type 2 diabetes mellitus with unspecified diabetic retinopathy without macular edema: Secondary | ICD-10-CM | POA: Diagnosis not present

## 2021-05-12 DIAGNOSIS — Z881 Allergy status to other antibiotic agents status: Secondary | ICD-10-CM | POA: Diagnosis not present

## 2021-05-12 DIAGNOSIS — H15011 Anterior scleritis, right eye: Secondary | ICD-10-CM | POA: Diagnosis not present

## 2021-05-12 DIAGNOSIS — I10 Essential (primary) hypertension: Secondary | ICD-10-CM | POA: Diagnosis not present

## 2021-05-12 DIAGNOSIS — Z7984 Long term (current) use of oral hypoglycemic drugs: Secondary | ICD-10-CM | POA: Diagnosis not present

## 2021-05-12 DIAGNOSIS — Z886 Allergy status to analgesic agent status: Secondary | ICD-10-CM | POA: Diagnosis not present

## 2021-05-12 DIAGNOSIS — H5711 Ocular pain, right eye: Secondary | ICD-10-CM | POA: Diagnosis not present

## 2021-05-12 DIAGNOSIS — Z794 Long term (current) use of insulin: Secondary | ICD-10-CM | POA: Diagnosis not present

## 2021-05-13 ENCOUNTER — Telehealth: Payer: Self-pay | Admitting: Internal Medicine

## 2021-05-13 DIAGNOSIS — I771 Stricture of artery: Secondary | ICD-10-CM | POA: Diagnosis not present

## 2021-05-13 NOTE — Telephone Encounter (Signed)
Spoke to pt's wife per DPR. ?

## 2021-05-13 NOTE — Telephone Encounter (Signed)
Pt wife called in stated pt was seen in the ED for his eyes and was proscribed Pantoprazole wants to know if it will effect his blood sugar .  Please advise [phone] ?

## 2021-05-14 NOTE — Telephone Encounter (Signed)
Left detailed message on verified VM for wife with message from Dr Silvio Pate. Asked that she call back if the 10 units twice daily is not enough ?

## 2021-05-14 NOTE — Telephone Encounter (Addendum)
Pt wife called stating that pt is on medication Predisone and Prednisolone-acetate and asking if these medication will effect pt blood Sugar. Pt wife states that pt Blood Sugar today was 414. Please  advise. ?

## 2021-05-16 DIAGNOSIS — H15001 Unspecified scleritis, right eye: Secondary | ICD-10-CM | POA: Diagnosis not present

## 2021-05-27 ENCOUNTER — Other Ambulatory Visit: Payer: Self-pay | Admitting: Internal Medicine

## 2021-06-05 DIAGNOSIS — E113511 Type 2 diabetes mellitus with proliferative diabetic retinopathy with macular edema, right eye: Secondary | ICD-10-CM | POA: Diagnosis not present

## 2021-06-06 DIAGNOSIS — E1122 Type 2 diabetes mellitus with diabetic chronic kidney disease: Secondary | ICD-10-CM | POA: Diagnosis not present

## 2021-06-06 DIAGNOSIS — D631 Anemia in chronic kidney disease: Secondary | ICD-10-CM | POA: Diagnosis not present

## 2021-06-06 DIAGNOSIS — R809 Proteinuria, unspecified: Secondary | ICD-10-CM | POA: Diagnosis not present

## 2021-06-06 DIAGNOSIS — N1832 Chronic kidney disease, stage 3b: Secondary | ICD-10-CM | POA: Diagnosis not present

## 2021-06-06 DIAGNOSIS — I10 Essential (primary) hypertension: Secondary | ICD-10-CM | POA: Diagnosis not present

## 2021-06-06 DIAGNOSIS — N2581 Secondary hyperparathyroidism of renal origin: Secondary | ICD-10-CM | POA: Diagnosis not present

## 2021-06-06 DIAGNOSIS — I509 Heart failure, unspecified: Secondary | ICD-10-CM | POA: Diagnosis not present

## 2021-06-14 ENCOUNTER — Ambulatory Visit: Payer: Medicare PPO | Admitting: Internal Medicine

## 2021-06-18 ENCOUNTER — Encounter: Payer: Self-pay | Admitting: Internal Medicine

## 2021-06-18 ENCOUNTER — Ambulatory Visit: Payer: Medicare PPO | Admitting: Internal Medicine

## 2021-06-18 ENCOUNTER — Other Ambulatory Visit: Payer: Self-pay | Admitting: Internal Medicine

## 2021-06-18 VITALS — BP 122/84 | HR 72 | Temp 97.1°F | Ht 67.0 in | Wt 256.0 lb

## 2021-06-18 DIAGNOSIS — E1142 Type 2 diabetes mellitus with diabetic polyneuropathy: Secondary | ICD-10-CM

## 2021-06-18 DIAGNOSIS — Z794 Long term (current) use of insulin: Secondary | ICD-10-CM | POA: Diagnosis not present

## 2021-06-18 DIAGNOSIS — N1832 Chronic kidney disease, stage 3b: Secondary | ICD-10-CM | POA: Diagnosis not present

## 2021-06-18 DIAGNOSIS — R0609 Other forms of dyspnea: Secondary | ICD-10-CM | POA: Insufficient documentation

## 2021-06-18 DIAGNOSIS — E113411 Type 2 diabetes mellitus with severe nonproliferative diabetic retinopathy with macular edema, right eye: Secondary | ICD-10-CM | POA: Diagnosis not present

## 2021-06-18 LAB — POCT GLYCOSYLATED HEMOGLOBIN (HGB A1C): Hemoglobin A1C: 9.8 % — AB (ref 4.0–5.6)

## 2021-06-18 MED ORDER — EMPAGLIFLOZIN 10 MG PO TABS: 10.0000 mg | ORAL_TABLET | Freq: Every day | ORAL | 11 refills | Status: DC

## 2021-06-18 MED ORDER — TRIAMCINOLONE ACETONIDE 0.1 % EX CREA: 1.0000 "application " | TOPICAL_CREAM | Freq: Two times a day (BID) | CUTANEOUS | 0 refills | Status: DC

## 2021-06-18 MED ORDER — FLUTICASONE PROPIONATE 50 MCG/ACT NA SUSP: 2.0000 | Freq: Every day | NASAL | 11 refills | Status: DC

## 2021-06-18 NOTE — Assessment & Plan Note (Signed)
BMI down some with 20# loss--but still over 35 ?With HTN, diabetes, arthritis, etc ?Still on the trulicity ?
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
## 2021-06-18 NOTE — Assessment & Plan Note (Signed)
Is on lisinopril '20mg'$  daily ?Will add jardiance 10  ?Check labs in 2-3 weeks ?

## 2021-06-18 NOTE — Assessment & Plan Note (Signed)
Lab Results  ?Component Value Date  ? HGBA1C 9.8 (A) 06/18/2021  ? ?Control still unacceptable ?On 70/30 insulin 50-60 units bid---can't increase due to concerns for hypoglycemia ?trulicity '3mg'$  weekly has helped weight loss ?Will try adding jardiance '10mg'$  for diabetes and CKD ?

## 2021-06-18 NOTE — Assessment & Plan Note (Signed)
Concerning for coronary ischemia ?Will set up with cardiology ?

## 2021-06-22 ENCOUNTER — Other Ambulatory Visit: Payer: Self-pay | Admitting: Internal Medicine

## 2021-06-28 ENCOUNTER — Observation Stay: Payer: Medicare PPO

## 2021-06-28 ENCOUNTER — Observation Stay
Admission: EM | Admit: 2021-06-28 | Discharge: 2021-06-29 | Disposition: A | Discharge status: Home / Self Care | Payer: Medicare PPO | Attending: Internal Medicine | Admitting: Internal Medicine

## 2021-06-28 ENCOUNTER — Emergency Department: Payer: Medicare PPO

## 2021-06-28 ENCOUNTER — Telehealth: Payer: Self-pay

## 2021-06-28 DIAGNOSIS — I9589 Other hypotension: Secondary | ICD-10-CM | POA: Diagnosis not present

## 2021-06-28 DIAGNOSIS — Z7985 Long-term (current) use of injectable non-insulin antidiabetic drugs: Secondary | ICD-10-CM | POA: Diagnosis not present

## 2021-06-28 DIAGNOSIS — E860 Dehydration: Secondary | ICD-10-CM

## 2021-06-28 DIAGNOSIS — I129 Hypertensive chronic kidney disease with stage 1 through stage 4 chronic kidney disease, or unspecified chronic kidney disease: Secondary | ICD-10-CM | POA: Insufficient documentation

## 2021-06-28 DIAGNOSIS — R059 Cough, unspecified: Secondary | ICD-10-CM | POA: Diagnosis not present

## 2021-06-28 DIAGNOSIS — Z79899 Other long term (current) drug therapy: Secondary | ICD-10-CM | POA: Insufficient documentation

## 2021-06-28 DIAGNOSIS — R531 Weakness: Secondary | ICD-10-CM | POA: Diagnosis not present

## 2021-06-28 DIAGNOSIS — R600 Localized edema: Secondary | ICD-10-CM

## 2021-06-28 DIAGNOSIS — Z7901 Long term (current) use of anticoagulants: Secondary | ICD-10-CM | POA: Diagnosis not present

## 2021-06-28 DIAGNOSIS — R42 Dizziness and giddiness: Secondary | ICD-10-CM | POA: Diagnosis present

## 2021-06-28 DIAGNOSIS — I82402 Acute embolism and thrombosis of unspecified deep veins of left lower extremity: Secondary | ICD-10-CM | POA: Diagnosis not present

## 2021-06-28 DIAGNOSIS — Z7982 Long term (current) use of aspirin: Secondary | ICD-10-CM | POA: Diagnosis not present

## 2021-06-28 DIAGNOSIS — Z87891 Personal history of nicotine dependence: Secondary | ICD-10-CM | POA: Insufficient documentation

## 2021-06-28 DIAGNOSIS — Z20822 Contact with and (suspected) exposure to covid-19: Secondary | ICD-10-CM | POA: Diagnosis not present

## 2021-06-28 DIAGNOSIS — Z8739 Personal history of other diseases of the musculoskeletal system and connective tissue: Secondary | ICD-10-CM | POA: Diagnosis not present

## 2021-06-28 DIAGNOSIS — I82432 Acute embolism and thrombosis of left popliteal vein: Secondary | ICD-10-CM | POA: Diagnosis not present

## 2021-06-28 DIAGNOSIS — R002 Palpitations: Secondary | ICD-10-CM | POA: Diagnosis not present

## 2021-06-28 DIAGNOSIS — I959 Hypotension, unspecified: Principal | ICD-10-CM | POA: Diagnosis present

## 2021-06-28 DIAGNOSIS — N179 Acute kidney failure, unspecified: Secondary | ICD-10-CM

## 2021-06-28 DIAGNOSIS — E1122 Type 2 diabetes mellitus with diabetic chronic kidney disease: Secondary | ICD-10-CM | POA: Diagnosis not present

## 2021-06-28 DIAGNOSIS — E861 Hypovolemia: Secondary | ICD-10-CM

## 2021-06-28 DIAGNOSIS — N183 Chronic kidney disease, stage 3 unspecified: Secondary | ICD-10-CM | POA: Diagnosis not present

## 2021-06-28 DIAGNOSIS — R112 Nausea with vomiting, unspecified: Secondary | ICD-10-CM | POA: Diagnosis not present

## 2021-06-28 DIAGNOSIS — R000 Tachycardia, unspecified: Secondary | ICD-10-CM | POA: Diagnosis not present

## 2021-06-28 LAB — URINALYSIS, COMPLETE (UACMP) WITH MICROSCOPIC
Bilirubin Urine: NEGATIVE
Glucose, UA: 150 mg/dL — AB
Ketones, ur: NEGATIVE mg/dL
Leukocytes,Ua: NEGATIVE
Nitrite: NEGATIVE
Protein, ur: NEGATIVE mg/dL
Specific Gravity, Urine: 1.012 (ref 1.005–1.030)
Squamous Epithelial / HPF: NONE SEEN (ref 0–5)
pH: 5 (ref 5.0–8.0)

## 2021-06-28 LAB — CBC WITH DIFFERENTIAL/PLATELET
Abs Immature Granulocytes: 0.03 10*3/uL (ref 0.00–0.07)
Basophils Absolute: 0.1 10*3/uL (ref 0.0–0.1)
Basophils Relative: 1 %
Eosinophils Absolute: 0.1 10*3/uL (ref 0.0–0.5)
Eosinophils Relative: 2 %
HCT: 34.3 % — ABNORMAL LOW (ref 39.0–52.0)
Hemoglobin: 10.9 g/dL — ABNORMAL LOW (ref 13.0–17.0)
Immature Granulocytes: 0 %
Lymphocytes Relative: 46 %
Lymphs Abs: 3.2 10*3/uL (ref 0.7–4.0)
MCH: 28.4 pg (ref 26.0–34.0)
MCHC: 31.8 g/dL (ref 30.0–36.0)
MCV: 89.3 fL (ref 80.0–100.0)
Monocytes Absolute: 0.6 10*3/uL (ref 0.1–1.0)
Monocytes Relative: 8 %
Neutro Abs: 3.1 10*3/uL (ref 1.7–7.7)
Neutrophils Relative %: 43 %
Platelets: 138 10*3/uL — ABNORMAL LOW (ref 150–400)
RBC: 3.84 MIL/uL — ABNORMAL LOW (ref 4.22–5.81)
RDW: 15.5 % (ref 11.5–15.5)
WBC: 7.1 10*3/uL (ref 4.0–10.5)
nRBC: 0 % (ref 0.0–0.2)

## 2021-06-28 LAB — LACTIC ACID, PLASMA: Lactic Acid, Venous: 1.6 mmol/L (ref 0.5–1.9)

## 2021-06-28 LAB — COMPREHENSIVE METABOLIC PANEL
ALT: 27 U/L (ref 0–44)
AST: 21 U/L (ref 15–41)
Albumin: 2.8 g/dL — ABNORMAL LOW (ref 3.5–5.0)
Alkaline Phosphatase: 55 U/L (ref 38–126)
Anion gap: 8 (ref 5–15)
BUN: 66 mg/dL — ABNORMAL HIGH (ref 8–23)
CO2: 24 mmol/L (ref 22–32)
Calcium: 8.6 mg/dL — ABNORMAL LOW (ref 8.9–10.3)
Chloride: 105 mmol/L (ref 98–111)
Creatinine, Ser: 2.7 mg/dL — ABNORMAL HIGH (ref 0.61–1.24)
GFR, Estimated: 24 mL/min — ABNORMAL LOW (ref >60)
Glucose, Bld: 103 mg/dL — ABNORMAL HIGH (ref 70–99)
Potassium: 3.6 mmol/L (ref 3.5–5.1)
Sodium: 137 mmol/L (ref 135–145)
Total Bilirubin: 0.6 mg/dL (ref 0.3–1.2)
Total Protein: 6.6 g/dL (ref 6.5–8.1)

## 2021-06-28 LAB — GLUCOSE, CAPILLARY
Glucose-Capillary: 146 mg/dL — ABNORMAL HIGH (ref 70–99)
Glucose-Capillary: 115 mg/dL — ABNORMAL HIGH (ref 70–99)

## 2021-06-28 LAB — RESP PANEL BY RT-PCR (FLU A&B, COVID) ARPGX2
Influenza A by PCR: NEGATIVE
Influenza B by PCR: NEGATIVE
SARS Coronavirus 2 by RT PCR: NEGATIVE

## 2021-06-28 LAB — MAGNESIUM: Magnesium: 1.8 mg/dL (ref 1.7–2.4)

## 2021-06-28 MED ORDER — LACTATED RINGERS IV BOLUS
1000.0000 mL | Freq: Once | INTRAVENOUS | Status: AC
  Administered 2021-06-28: 1000 mL via INTRAVENOUS

## 2021-06-28 MED ORDER — FLEET ENEMA 7-19 GM/118ML RE ENEM
1.0000 | ENEMA | Freq: Once | RECTAL | Status: DC | PRN
Start: 2021-06-28 — End: 2021-06-29

## 2021-06-28 MED ORDER — SENNA 8.6 MG PO TABS
1.0000 | ORAL_TABLET | Freq: Two times a day (BID) | ORAL | Status: DC
  Administered 2021-06-28 – 2021-06-29 (×2): 8.6 mg via ORAL
  Filled 2021-06-28 (×3): qty 1

## 2021-06-28 MED ORDER — ACETAMINOPHEN 325 MG PO TABS: 650.0000 mg | ORAL_TABLET | Freq: Four times a day (QID) | ORAL | Status: DC | PRN

## 2021-06-28 MED ORDER — ONDANSETRON HCL 4 MG PO TABS: 4.0000 mg | ORAL_TABLET | Freq: Four times a day (QID) | ORAL | Status: DC | PRN

## 2021-06-28 MED ORDER — ASPIRIN 81 MG PO CHEW
81.0000 mg | CHEWABLE_TABLET | Freq: Every day | ORAL | Status: DC
  Administered 2021-06-28 – 2021-06-29 (×2): 81 mg via ORAL
  Filled 2021-06-28 (×2): qty 1

## 2021-06-28 MED ORDER — CALCITRIOL 0.25 MCG PO CAPS
0.2500 ug | ORAL_CAPSULE | Freq: Every day | ORAL | Status: DC
  Administered 2021-06-28 – 2021-06-29 (×2): 0.25 ug via ORAL
  Filled 2021-06-28 (×3): qty 1

## 2021-06-28 MED ORDER — BRIMONIDINE TARTRATE-TIMOLOL 0.2-0.5 % OP SOLN
1.0000 [drp] | Freq: Two times a day (BID) | OPHTHALMIC | Status: DC
  Filled 2021-06-28 (×3): qty 5

## 2021-06-28 MED ORDER — PREDNISOLONE ACETATE 1 % OP SUSP
1.0000 [drp] | Freq: Two times a day (BID) | OPHTHALMIC | Status: DC
  Administered 2021-06-28 – 2021-06-29 (×2): 1 [drp] via OPHTHALMIC
  Filled 2021-06-28: qty 1

## 2021-06-28 MED ORDER — ONDANSETRON HCL 4 MG/2ML IJ SOLN: 4.0000 mg | Freq: Four times a day (QID) | INTRAMUSCULAR | Status: DC | PRN

## 2021-06-28 MED ORDER — LACTATED RINGERS IV SOLN: INTRAVENOUS | Status: DC

## 2021-06-28 MED ORDER — ENOXAPARIN SODIUM 30 MG/0.3ML IJ SOSY
30.0000 mg | PREFILLED_SYRINGE | INTRAMUSCULAR | Status: DC
  Administered 2021-06-28: 30 mg via SUBCUTANEOUS
  Filled 2021-06-28: qty 0.3

## 2021-06-28 MED ORDER — ACETAMINOPHEN 650 MG RE SUPP: 650.0000 mg | Freq: Four times a day (QID) | RECTAL | Status: DC | PRN

## 2021-06-28 MED ORDER — ATORVASTATIN CALCIUM 20 MG PO TABS
80.0000 mg | ORAL_TABLET | Freq: Every day | ORAL | Status: DC
  Administered 2021-06-28 – 2021-06-29 (×2): 80 mg via ORAL
  Filled 2021-06-28 (×2): qty 4

## 2021-06-28 MED ORDER — BISACODYL 10 MG RE SUPP
10.0000 mg | Freq: Every day | RECTAL | Status: DC | PRN
  Filled 2021-06-28: qty 1

## 2021-06-28 MED ORDER — POLYETHYLENE GLYCOL 3350 17 G PO PACK: 17.0000 g | PACK | Freq: Every day | ORAL | Status: DC | PRN

## 2021-06-28 NOTE — ED Notes (Signed)
Pt transported back from Xray via stretcher. ?

## 2021-06-28 NOTE — ED Notes (Signed)
Pt transported to US via stretcher at this time.  

## 2021-06-28 NOTE — ED Notes (Signed)
Dr. Tamala Julian at bedside assessing pt at this time.  ?

## 2021-06-28 NOTE — Progress Notes (Signed)
Admission profile updated. ?

## 2021-06-28 NOTE — Telephone Encounter (Signed)
Per chart review tab pt is presently at [facility] ED. Sending note to Dr Silvio Pate and Larene Beach CMA. ?

## 2021-06-28 NOTE — Telephone Encounter (Signed)
[facility] - Client ?TELEPHONE ADVICE RECORD ?AccessNurse? ?Patient ?Name: ?Adam WR ?IGHT ?Gender: Male ?DOB: 1950/11/18 ?Age: 71 Y 5 M 18 D ?Return ?Phone ?Number: ?[id number] ?(Primary), ?[id number] ?(Secondary) ?Address: [address] ?City/ ?State/ ?Zip: [place] ?[address] ?Client [facility] - Client ?Client Site [facility] - Day ?Provider Viviana Simpler- MD ?Contact Type Call ?Who Is Calling Patient / Member / Family / Caregiver ?Call Type Triage / Clinical ?Caller Name Osamu Olguin ?Relationship To Patient Spouse ?Return Phone Number [phone] (Secondary) ?Chief Complaint BLOOD PRESSURE LOW - Systolic (top ?number) 90 or less ?Reason for Call Symptomatic / Request for Health Information ?Initial Comment Caller states her father's blood pressure has been ?low. Currently at 84/66. ?Translation No ?Nurse Assessment ?Nurse: Gildardo Pounds, RN, Amy Date/Time Eilene Ghazi Time): 06/28/2021 9:35:45 AM ?Confirm and document reason for call. If ?symptomatic, describe symptoms. ?---Caller states her husband's blood pressure has ?been low. Currently at 84/66 & 82/64. He is dizzy & ?lightheaded. He has been feeling like this the past few ?days. He was weak & nauseated & saw his PCP last ?Thursday. Heart dr appt. coming up. Last night BP was ?82/59, 78/58, & 76/55. He takes BP meds, Lisinopril ?20/'25mg'$  QD & a water pill. ?Does the patient have any new or worsening ?symptoms? ---Yes ?Will a triage be completed? ---Yes ?Related visit to physician within the last 2 weeks? ---Yes ?Does the PT have any chronic conditions? (i.e. ?diabetes, asthma, this includes High risk factors for ?pregnancy, etc.) ?---Yes ?List chronic conditions. ---DM, HTN, high cholesterol ?Is this a behavioral health or substance abuse call? ---No ?Guidelines ?Guideline Title Affirmed Question Affirmed Notes Nurse Date/Time (Eastern ?Time) ?Blood Pressure - Low [4] Systolic  BP < ?90 AND [7] dizzy, ?lightheaded, or weak ?Lovelace, RN, [place] 06/28/2021 9:41:26 AM ?PLEASE NOTE: All timestamps contained within this report are represented as [place] Standard Time. ?CONFIDENTIALTY NOTICE: This fax transmission is intended only for the addressee. It contains information that is legally privileged, confidential or ?otherwise protected from use or disclosure. If you are not the intended recipient, you are strictly prohibited from reviewing, disclosing, copying using ?or disseminating any of this information or taking any action in reliance on or regarding this information. If you have received this fax in error, please ?notify us immediately by telephone so that we can arrange for its return to [place]. Phone: [phone], Toll-Free: [phone], Fax: [phone] ?Page: 2 of 2 ?Call Id: [id number] ?Disp. Time (Eastern ?Time) Disposition Final User ?06/28/2021 9:32:06 AM Send to Urgent Paula Compton, [place] ?06/28/2021 9:49:33 AM 911 Outcome Documentation Lovelace, RN, Amy ?Reason: Caller states she called 911 & ?they are on the way. ?06/28/2021 9:43:49 AM Call EMS 911 Now Yes Lovelace, RN, Amy ?Caller Disagree/Comply Comply ?Caller Understands Yes ?PreDisposition InappropriateToAsk ?Care Advice Given Per Guideline ?CALL EMS 911 NOW: * Immediate medical attention is needed. You need to hang up and call 911 (or an ambulance). * Triager ?Discretion: I'll call you back in a few minutes to be sure you were able to reach them. FIRST AID - LIE DOWN FOR SHOCK: * Lie ?down with feet elevated. CARE ADVICE given per Low Blood Pressure (Adult) guideline ?

## 2021-06-28 NOTE — ED Notes (Signed)
Pt ambulated to BR with slow but steady gait. Pt endorses some dizziness that improved prior to ambulating.  ?

## 2021-06-28 NOTE — ED Triage Notes (Signed)
Pt BIB [facility] from home C/O dizziness, intermittent hypotension X 3 days. Pt reports recent medication changes at cardiologist, but is unsure which medications were changed. ?

## 2021-06-28 NOTE — ED Notes (Signed)
Pt transported back from [place] at this time.  ?
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
## 2021-06-28 NOTE — ED Notes (Signed)
Pt transported to CT via stretcher at this time.  

## 2021-06-28 NOTE — Telephone Encounter (Signed)
Will await ER disposition and set up follow up as appropriate ?

## 2021-06-28 NOTE — ED Provider Notes (Signed)
? ?[facility] ?Provider Note ? ? ? Event Date/Time  ? First MD Initiated Contact with Patient 06/28/21 1034   ?  (approximate) ? ? ?History  ? ?Dizziness ? ? ?HPI ? ?Adam [name] is a 71 y.o. male with a past medical history of DM, PVD, HTN, CKD, And known renal mass who presents EMS from home for evaluation of some dizziness and low blood pressures the last couple days.  Patient was recently started on new blood pressure medicines about a week ago including lisinopril.  Patient also endorses nonproductive cough for the last couple weeks.  He denies any chest pain, shortness of breath, headache, earache, sore throat, abdominal pain, diarrhea, burning with urination rash or extremity pain.  Denies any focal weakness or numbness or tingling.  Denies any recent EtOH use or illicit drug use. ? ?  ? ? ?Physical Exam  ?Triage Vital Signs: ?ED Triage Vitals [06/28/21 1031]  ?Enc Vitals Group  ?   BP   ?   Pulse   ?   Resp   ?   Temp   ?   Temp src   ?   SpO2   ?   Weight 255 lb (115.7 kg)  ?   Height '5\' 7"'$  (1.702 m)  ?   Head Circumference   ?   Peak Flow   ?   Pain Score 0  ?   Pain Loc   ?   Pain Edu?   ?   Excl. in [place]?   ? ? ?Most recent vital signs: ?Vitals:  ? 06/28/21 1035 06/28/21 1142  ?BP: (!) 104/56 96/63  ?Pulse: 99 91  ?Resp: 16 14  ?Temp: 98.5 ?F (36.9 ?C)   ?SpO2: 99% 99%  ? ? ?General: Awake, no distress.  ?CV:  Good peripheral perfusion. 2+ radial pulse.   ?Resp:  Normal effort. Clear b/l ?Abd:  No distention. Soft.  ?Other:  Patient is awake alert oriented with symmetric strength in his extremities and sensation intact light touch all extremities.  Cranials 2 through 12 are grossly intact. ? ? ?ED Results / Procedures / Treatments  ?Labs ?(all labs ordered are listed, but only abnormal results are displayed) ?Labs Reviewed  ?CBC WITH DIFFERENTIAL/PLATELET - Abnormal; Notable for the following components:  ?    Result Value  ? RBC 3.84 (*)   ? Hemoglobin 10.9 (*)   ? HCT 34.3 (*)    ? Platelets 138 (*)   ? All other components within normal limits  ?COMPREHENSIVE METABOLIC PANEL - Abnormal; Notable for the following components:  ? Glucose, Bld 103 (*)   ? BUN 66 (*)   ? Creatinine, Ser 2.70 (*)   ? Calcium 8.6 (*)   ? Albumin 2.8 (*)   ? GFR, Estimated 24 (*)   ? All other components within normal limits  ?RESP PANEL BY RT-PCR (FLU A&B, COVID) [id number]  ?LACTIC ACID, PLASMA  ?MAGNESIUM  ?URINALYSIS, COMPLETE (UACMP) WITH MICROSCOPIC  ? ? ? ?EKG ? ?ECG is remarkable for sinus rhythm with ventricular rate of 95, first-degree AV block with a PR interval of 254 with otherwise unremarkable intervals and no clear evidence of acute ischemia. ? ? ?RADIOLOGY ?Chest reviewed by myself shows no focal consoidation, effusion, edema, pneumothorax or other clear acute thoracic process. I also reviewed radiology interpretation and agree with findings described. ? ? ? ?PROCEDURES: ? ?Critical Care performed: No ? ?.1-3 Lead EKG Interpretation ?Performed by: Lucrezia Starch, MD ?  Authorized by: Lucrezia Starch, MD  ? ?  Interpretation: normal   ?  ECG rate assessment: normal   ?  Rhythm: sinus rhythm   ?  Ectopy: none   ?  Conduction: abnormal   ?  Abnormal conduction: 1st degree AV block   ? ?The patient is on the cardiac monitor to evaluate for evidence of arrhythmia and/or significant heart rate changes. ? ? ?MEDICATIONS ORDERED IN ED: ?Medications  ?lactated ringers bolus 1,000 mL (1,000 mLs Intravenous New Bag/Given 06/28/21 1156)  ? ? ? ?IMPRESSION / MDM / ASSESSMENT AND PLAN / ED COURSE  ?I reviewed the triage vital signs and the nursing notes. ?             ?               ? ?Differential diagnosis includes, but is not limited to dizziness and low blood pressures reportedly measured by family at home secondary to new blood pressure medication, decreased p.o. intake, arrhythmia, anemia, cardiomyopathy, acute infectious process with a lower suspicion for PE given absence of any chest pain, shortness of  breath or evidence of hypoxia tachypnea or tachycardia. ? ?ECG is remarkable for sinus rhythm with ventricular rate of 95, first-degree AV block with a PR interval of 254 with otherwise unremarkable intervals and no clear evidence of acute ischemia. ? ?Chest reviewed by myself shows no focal consoidation, effusion, edema, pneumothorax or other clear acute thoracic process. I also reviewed radiology interpretation and agree with findings described. ? ?COVID influenza PCR is negative.  CBC without leukocytosis or acute anemia with hemoglobin today at 10.9 compared to 11.96 months ago and range between 10.3-11.9 over the last year.  CMP is remarkable for creatinine of 2.7 with a BUN of 66 without other significant electrolyte or metabolic derangements.  The last creatinine I can see in EHR is from [facility] from 3/20 that showed a BUN of 86 and a creatinine of 2.33.  This represents an AKI today.  Lactic acid is not elevated.  Magnesium is within normal limits. ? ?Shortly after patient arrived his spouse also arrived.  They confirmed that he was not started on any new blood pressure medicines but only calcitriol by his nephrologist.  They note he is on combination of hydrochlorothiazide and lisinopril as well as Lasix.  Patient's has his Lasix bottle which is 80 mg tablets and he states he was instructed to take half a tablet twice a day which he has been doing. ? ?I suspect low blood pressures and dizziness related to some volume depletion effects of his blood pressure medicine occasion.  We will start some IV fluids.  We will also obtain a renal ultrasound. ? ?  ? ? ?FINAL CLINICAL IMPRESSION(S) / ED DIAGNOSES  ? ?Final diagnoses:  ?AKI (acute kidney injury) ([place])  ?Dehydration  ? ? ? ?Rx / DC Orders  ? ?ED Discharge Orders   ? ? None  ? ?  ? ? ? ?Note:  This document was prepared using Dragon voice recognition software and may include unintentional dictation errors. ?  ?Lucrezia Starch, MD ?06/28/21 1229 ? ?

## 2021-06-29 ENCOUNTER — Observation Stay (HOSPITAL_BASED_OUTPATIENT_CLINIC_OR_DEPARTMENT_OTHER)
Admit: 2021-06-29 | Discharge: 2021-06-29 | Disposition: A | Discharge status: Home / Self Care | Payer: Medicare PPO | Attending: Internal Medicine | Admitting: Internal Medicine

## 2021-06-29 DIAGNOSIS — R0609 Other forms of dyspnea: Secondary | ICD-10-CM

## 2021-06-29 DIAGNOSIS — E861 Hypovolemia: Secondary | ICD-10-CM | POA: Diagnosis not present

## 2021-06-29 DIAGNOSIS — I9589 Other hypotension: Secondary | ICD-10-CM | POA: Diagnosis not present

## 2021-06-29 LAB — GLUCOSE, CAPILLARY
Glucose-Capillary: 142 mg/dL — ABNORMAL HIGH (ref 70–99)
Glucose-Capillary: 188 mg/dL — ABNORMAL HIGH (ref 70–99)

## 2021-06-29 LAB — BASIC METABOLIC PANEL
Anion gap: 10 (ref 5–15)
BUN: 52 mg/dL — ABNORMAL HIGH (ref 8–23)
CO2: 22 mmol/L (ref 22–32)
Calcium: 8.9 mg/dL (ref 8.9–10.3)
Chloride: 105 mmol/L (ref 98–111)
Creatinine, Ser: 2.39 mg/dL — ABNORMAL HIGH (ref 0.61–1.24)
GFR, Estimated: 28 mL/min — ABNORMAL LOW (ref >60)
Glucose, Bld: 147 mg/dL — ABNORMAL HIGH (ref 70–99)
Potassium: 4 mmol/L (ref 3.5–5.1)
Sodium: 137 mmol/L (ref 135–145)

## 2021-06-29 LAB — ECHOCARDIOGRAM COMPLETE
AR max vel: 3.89 cm2
AV Peak grad: 5.9 mmHg
Ao pk vel: 1.21 m/s
Area-P 1/2: 4.31 cm2
Calc EF: 81.1 %
Height: 71 in
S' Lateral: 2.5 cm
Single Plane A2C EF: 72.4 %
Single Plane A4C EF: 85 %
Weight: 4144.65 oz

## 2021-06-29 LAB — CBC
HCT: 34.3 % — ABNORMAL LOW (ref 39.0–52.0)
Hemoglobin: 11 g/dL — ABNORMAL LOW (ref 13.0–17.0)
MCH: 28.1 pg (ref 26.0–34.0)
MCHC: 32.1 g/dL (ref 30.0–36.0)
MCV: 87.5 fL (ref 80.0–100.0)
Platelets: 165 10*3/uL (ref 150–400)
RBC: 3.92 MIL/uL — ABNORMAL LOW (ref 4.22–5.81)
RDW: 15.5 % (ref 11.5–15.5)
WBC: 6.9 10*3/uL (ref 4.0–10.5)
nRBC: 0 % (ref 0.0–0.2)

## 2021-06-29 MED ORDER — PERFLUTREN LIPID MICROSPHERE
1.0000 mL | INTRAVENOUS | Status: AC | PRN
  Administered 2021-06-29: 3 mL via INTRAVENOUS
  Filled 2021-06-29: qty 10

## 2021-06-29 MED ORDER — FUROSEMIDE 40 MG PO TABS: 40.0000 mg | ORAL_TABLET | Freq: Every day | ORAL | 0 refills | Status: DC

## 2021-06-29 MED ORDER — LISINOPRIL-HYDROCHLOROTHIAZIDE 10-12.5 MG PO TABS: 1.0000 | ORAL_TABLET | Freq: Every day | ORAL | 0 refills | Status: DC

## 2021-06-29 NOTE — Discharge Summary (Signed)
Physician Discharge Summary  ?Adam [name]:[id number] DOB: Mar 30, 1950 DOA: 06/28/2021 ? ?PCP: [name], MD ? ?Admit date: 06/28/2021 ?Discharge date: 06/29/2021 ? ?Admitted From: Home ?Disposition: Home ? ?Recommendations for Outpatient Follow-up:  ?Follow up with PCP in 1-2 weeks ?Follow-up with nephrology 1 to 2 weeks ? ?Home Health: No ?Equipment/Devices: None ? ?Discharge Condition: Stable ?CODE STATUS: Full ?Diet recommendation: Carb modified ? ?Brief/Interim Summary: ? ?71 y.o. male with history of HTN, poorly controlled DM 2, CKD 3, hyperparathyroidism, venous insufficiency with L LE edema for couple of months presents to ED earlier today with dizziness and low blood pressure. ?  ?History is per patient and his wife.  Patient notes he has been feeling weak and tired and dizzy for the past couple of days.  Patient's wife states she took his blood pressure yesterday and she noted them to be 70s over 40s, repeated them and they were the same.  This morning when he continued to feel unwell, she brought him into the ED. ?  ?Patient denies any fevers or chills.  He does admit to nausea and intermittent vomiting for several days.  No hematemesis.  He does admit to decreased p.o. intake.  No abdominal pain or diarrhea. ?  ?Of note patient had been seen by his PCP last week for complaints of fatigue, dizziness and DOE.  He had been referred to cardiology as an outpatient to work-up his fatigue/DOE. ?  ?Patient has had asymmetric L LE edema and had been started on Lasix 40 twice daily last month, notes that his L LE edema has much improved. ? ?At time of discharge nausea vomiting have resolved.  No longer dizzy.  Blood pressures improved.  I suspect that patient's presentation was driven by multiple blood pressure agents in the setting of nausea and vomiting and poor p.o. intake.  At time of discharge will recommend decrease of Lasix down to half home dose at 40 mg daily.  Also decrease Zestoretic to half home  dose.  Patient is instructed to follow-up with his PCP as well as nephrologist postdischarge ? ? ? ?Discharge Diagnoses:  ?Principal Problem: ?  Hypotension ? ?Hypotension and dizziness ?Suspect that symptoms are driven by intravascular volume depletion in setting of relatively high-dose diuretic and antihypertensive regimen.  Symptoms resolved after holding these medications and putting patients on IV fluids.  At time of discharge decrease home dose of Lasix to 40 mg daily.  Decrease home dose of Zestoretic to 10-25 daily.  Remainder of home medications unchanged.  Patient will follow-up with primary care and nephrology ? ?Discharge Instructions ? ?Discharge Instructions   ? ? Diet - low sodium heart healthy   Complete by: As directed ?  ? Increase activity slowly   Complete by: As directed ?  ? ?  ? ?Allergies as of 06/29/2021   ? ?   Reactions  ? Ibuprofen Swelling  ? LEGS  ? Cephalexin Rash  ? ?  ? ?  ?Medication List  ?  ? ?STOP taking these medications   ? ?lisinopril-hydrochlorothiazide 20-25 MG tablet ?Commonly known as: ZESTORETIC ?Replaced by: lisinopril-hydrochlorothiazide 10-12.5 MG tablet ?  ?pantoprazole 20 MG tablet ?Commonly known as: PROTONIX ?  ?predniSONE 10 MG tablet ?Commonly known as: DELTASONE ?  ? ?  ? ?TAKE these medications   ? ?Accu-Chek Aviva Plus test strip ?Generic drug: glucose blood ?Use to check blood sugar twice daily. Dx Code Q59.5638 ?  ?Accu-Chek Aviva Soln ?  ?acetaminophen 500 MG tablet ?  Commonly known as: TYLENOL ?Take 1,000 mg by mouth every 6 (six) hours as needed for moderate pain. ?  ?aspirin 81 MG tablet ?Take 81 mg by mouth daily. ?  ?atorvastatin 80 MG tablet ?Commonly known as: LIPITOR ?TAKE 1 TABLET EVERY DAY ?  ?calcitRIOL 0.25 MCG capsule ?Commonly known as: ROCALTROL ?Take 0.25 mcg by mouth daily. ?  ?Combigan 0.2-0.5 % ophthalmic solution ?Generic drug: brimonidine-timolol ?Place 1 drop into both eyes 2 (two) times daily. ?  ?empagliflozin 10 MG Tabs  tablet ?Commonly known as: JARDIANCE ?Take 1 tablet (10 mg total) by mouth daily before breakfast. ?  ?fluticasone 50 MCG/ACT nasal spray ?Commonly known as: FLONASE ?Place 2 sprays into both nostrils daily. ?  ?furosemide 40 MG tablet ?Commonly known as: LASIX ?Take 1 tablet (40 mg total) by mouth daily. Per Pt: Does not take everyday ?What changed:  ?medication strength ?how much to take ?  ?HumaLOG Mix 75/25 (75-25) 100 UNIT/ML Susp injection ?Generic drug: insulin lispro protamine-lispro ?Inject 50-60 Units into the skin 2 (two) times daily with a meal. ?  ?lisinopril-hydrochlorothiazide 10-12.5 MG tablet ?Commonly known as: Zestoretic ?Take 1 tablet by mouth daily. ?Replaces: lisinopril-hydrochlorothiazide 20-25 MG tablet ?  ?prednisoLONE acetate 1 % ophthalmic suspension ?Commonly known as: PRED FORTE ?Place 1 drop into the right eye 2 (two) times daily. ?  ?triamcinolone cream 0.1 % ?Commonly known as: KENALOG ?Apply 1 application. topically 2 (two) times daily. ?  ?Trulicity 3 RS/8.5IO Sopn ?Generic drug: Dulaglutide ?INJECT '3MG'$  UNDER THE SKIN AS DIRECTED ONCE WEEKLY ?  ?Vitamin D 50 MCG (2000 UT) tablet ?Take 2,000 Units by mouth daily. ?  ? ?  ? ? ?Allergies  ?Allergen Reactions  ? Ibuprofen Swelling  ?  LEGS  ? Cephalexin Rash  ? ? ?Consultations: ?None ? ? ?Procedures/Studies: ?DG Chest 2 View ? ?Result Date: 06/28/2021 ?CLINICAL DATA:  Cough EXAM: CHEST - 2 VIEW COMPARISON:  05/18/2020 FINDINGS: The heart size and mediastinal contours are within normal limits. No focal airspace consolidation, pleural effusion, or pneumothorax. Thoracic spine ankylosis. IMPRESSION: No active cardiopulmonary disease. Electronically Signed   By: Davina Poke D.O.   On: 06/28/2021 11:22  ? ?US Renal ? ?Result Date: 06/28/2021 ?CLINICAL DATA:  Acute kidney injury. 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 Echogenicity within normal limits. Solid mass at the upper pole of the right kidney measuring up to 4.3 cm, similar in size to previous MRI from 08/24/2020. Multiple simple cysts of the right kidney, largest at the lower pole measuring up to 3.3 cm. 8 mm nonobstructing stone in the lower pole of the right kidney. No hydronephrosis. Left Kidney: Renal measurements: 10.2 x 6.3 x 6.0 cm = volume: 200 mL. Echogenicity within normal limits. Exophytic cysts emanating from the lower pole of the left kidney measuring 5.9 x 4.7 x 5.7 cm. No shadowing stone or hydronephrosis visualized. Bladder: Appears normal for degree of bladder distention. Other: None. IMPRESSION: 1. No evidence of obstructive uropathy. 2. Nonobstructing 8 mm stone at the lower pole of the right kidney. 3. No appreciable change in size of solid mass arising from the upper pole of the right kidney measuring up to 4.3 cm corresponding to known renal cell carcinoma treated with cryoablation. Continued surveillance per clinical guidelines. 4. Bilateral renal cysts. Electronically Signed  By: Davina Poke D.O.   On: 06/28/2021 15:11  ? ?US Venous Img Lower Unilateral Left (DVT) ? ?Result Date: 06/28/2021 ?CLINICAL DATA:  71 year old male with a history of left leg swelling for 2 months EXAM: LEFT LOWER EXTREMITY VENOUS DOPPLER ULTRASOUND TECHNIQUE: Gray-scale sonography with graded compression, as well as color Doppler and duplex ultrasound were performed to evaluate the lower extremity deep venous systems from the level of the common femoral vein and including the common femoral, femoral, profunda femoral, popliteal and calf veins including the posterior tibial, peroneal and gastrocnemius veins when visible. The superficial great saphenous vein was also interrogated. Spectral Doppler was utilized to evaluate flow at rest and with distal augmentation maneuvers in the common  femoral, femoral and popliteal veins. COMPARISON:  None Available. FINDINGS: Contralateral Common Femoral Vein: Respiratory phasicity is normal and symmetric with the symptomatic side. No evidence of thrombus. Normal compre

## 2021-07-06 ENCOUNTER — Observation Stay
Admission: EM | Admit: 2021-07-06 | Discharge: 2021-07-07 | Disposition: A | Discharge status: Home / Self Care | Payer: Medicare PPO | Attending: Osteopathic Medicine | Admitting: Osteopathic Medicine

## 2021-07-06 ENCOUNTER — Emergency Department: Payer: Medicare PPO

## 2021-07-06 ENCOUNTER — Other Ambulatory Visit: Payer: Self-pay

## 2021-07-06 DIAGNOSIS — R0602 Shortness of breath: Secondary | ICD-10-CM

## 2021-07-06 DIAGNOSIS — I5032 Chronic diastolic (congestive) heart failure: Secondary | ICD-10-CM | POA: Diagnosis present

## 2021-07-06 DIAGNOSIS — Z79899 Other long term (current) drug therapy: Secondary | ICD-10-CM | POA: Diagnosis not present

## 2021-07-06 DIAGNOSIS — J811 Chronic pulmonary edema: Secondary | ICD-10-CM | POA: Diagnosis not present

## 2021-07-06 DIAGNOSIS — I824Y2 Acute embolism and thrombosis of unspecified deep veins of left proximal lower extremity: Principal | ICD-10-CM | POA: Insufficient documentation

## 2021-07-06 DIAGNOSIS — N400 Enlarged prostate without lower urinary tract symptoms: Secondary | ICD-10-CM | POA: Diagnosis present

## 2021-07-06 DIAGNOSIS — R351 Nocturia: Secondary | ICD-10-CM

## 2021-07-06 DIAGNOSIS — Z794 Long term (current) use of insulin: Secondary | ICD-10-CM | POA: Insufficient documentation

## 2021-07-06 DIAGNOSIS — Z7982 Long term (current) use of aspirin: Secondary | ICD-10-CM | POA: Diagnosis not present

## 2021-07-06 DIAGNOSIS — I82442 Acute embolism and thrombosis of left tibial vein: Secondary | ICD-10-CM | POA: Diagnosis not present

## 2021-07-06 DIAGNOSIS — E785 Hyperlipidemia, unspecified: Secondary | ICD-10-CM

## 2021-07-06 DIAGNOSIS — I872 Venous insufficiency (chronic) (peripheral): Secondary | ICD-10-CM | POA: Diagnosis not present

## 2021-07-06 DIAGNOSIS — M7989 Other specified soft tissue disorders: Secondary | ICD-10-CM | POA: Diagnosis not present

## 2021-07-06 DIAGNOSIS — E1122 Type 2 diabetes mellitus with diabetic chronic kidney disease: Secondary | ICD-10-CM | POA: Insufficient documentation

## 2021-07-06 DIAGNOSIS — E162 Hypoglycemia, unspecified: Secondary | ICD-10-CM | POA: Diagnosis present

## 2021-07-06 DIAGNOSIS — I10 Essential (primary) hypertension: Secondary | ICD-10-CM | POA: Diagnosis not present

## 2021-07-06 DIAGNOSIS — Z87891 Personal history of nicotine dependence: Secondary | ICD-10-CM | POA: Insufficient documentation

## 2021-07-06 DIAGNOSIS — E1169 Type 2 diabetes mellitus with other specified complication: Secondary | ICD-10-CM | POA: Diagnosis not present

## 2021-07-06 DIAGNOSIS — R0609 Other forms of dyspnea: Secondary | ICD-10-CM | POA: Diagnosis not present

## 2021-07-06 DIAGNOSIS — I130 Hypertensive heart and chronic kidney disease with heart failure and stage 1 through stage 4 chronic kidney disease, or unspecified chronic kidney disease: Secondary | ICD-10-CM | POA: Diagnosis not present

## 2021-07-06 DIAGNOSIS — N1832 Chronic kidney disease, stage 3b: Secondary | ICD-10-CM | POA: Diagnosis not present

## 2021-07-06 DIAGNOSIS — N184 Chronic kidney disease, stage 4 (severe): Secondary | ICD-10-CM | POA: Diagnosis present

## 2021-07-06 DIAGNOSIS — R2242 Localized swelling, mass and lump, left lower limb: Secondary | ICD-10-CM | POA: Diagnosis present

## 2021-07-06 DIAGNOSIS — I82409 Acute embolism and thrombosis of unspecified deep veins of unspecified lower extremity: Secondary | ICD-10-CM | POA: Diagnosis present

## 2021-07-06 DIAGNOSIS — Z7984 Long term (current) use of oral hypoglycemic drugs: Secondary | ICD-10-CM | POA: Diagnosis not present

## 2021-07-06 DIAGNOSIS — Z20822 Contact with and (suspected) exposure to covid-19: Secondary | ICD-10-CM | POA: Diagnosis not present

## 2021-07-06 DIAGNOSIS — E11649 Type 2 diabetes mellitus with hypoglycemia without coma: Secondary | ICD-10-CM | POA: Insufficient documentation

## 2021-07-06 DIAGNOSIS — I82412 Acute embolism and thrombosis of left femoral vein: Secondary | ICD-10-CM | POA: Diagnosis not present

## 2021-07-06 DIAGNOSIS — R600 Localized edema: Secondary | ICD-10-CM | POA: Diagnosis not present

## 2021-07-06 DIAGNOSIS — N401 Enlarged prostate with lower urinary tract symptoms: Secondary | ICD-10-CM

## 2021-07-06 DIAGNOSIS — I824Z2 Acute embolism and thrombosis of unspecified deep veins of left distal lower extremity: Secondary | ICD-10-CM

## 2021-07-06 DIAGNOSIS — M5134 Other intervertebral disc degeneration, thoracic region: Secondary | ICD-10-CM | POA: Diagnosis not present

## 2021-07-06 DIAGNOSIS — I82402 Acute embolism and thrombosis of unspecified deep veins of left lower extremity: Secondary | ICD-10-CM | POA: Diagnosis not present

## 2021-07-06 LAB — CBC
HCT: 33 % — ABNORMAL LOW (ref 39.0–52.0)
Hemoglobin: 10.2 g/dL — ABNORMAL LOW (ref 13.0–17.0)
MCH: 28 pg (ref 26.0–34.0)
MCHC: 30.9 g/dL (ref 30.0–36.0)
MCV: 90.7 fL (ref 80.0–100.0)
Platelets: 243 10*3/uL (ref 150–400)
RBC: 3.64 MIL/uL — ABNORMAL LOW (ref 4.22–5.81)
RDW: 15.2 % (ref 11.5–15.5)
WBC: 9 10*3/uL (ref 4.0–10.5)
nRBC: 0 % (ref 0.0–0.2)

## 2021-07-06 LAB — TROPONIN I (HIGH SENSITIVITY): Troponin I (High Sensitivity): 16 ng/L (ref <18)

## 2021-07-06 LAB — BASIC METABOLIC PANEL
Anion gap: 7 (ref 5–15)
BUN: 26 mg/dL — ABNORMAL HIGH (ref 8–23)
CO2: 23 mmol/L (ref 22–32)
Calcium: 8.7 mg/dL — ABNORMAL LOW (ref 8.9–10.3)
Chloride: 105 mmol/L (ref 98–111)
Creatinine, Ser: 1.93 mg/dL — ABNORMAL HIGH (ref 0.61–1.24)
GFR, Estimated: 36 mL/min — ABNORMAL LOW (ref >60)
Glucose, Bld: 97 mg/dL (ref 70–99)
Potassium: 4.8 mmol/L (ref 3.5–5.1)
Sodium: 135 mmol/L (ref 135–145)

## 2021-07-06 LAB — PROTIME-INR
INR: 1.1 (ref 0.8–1.2)
Prothrombin Time: 14 seconds (ref 11.4–15.2)

## 2021-07-06 LAB — CBG MONITORING, ED
Glucose-Capillary: 39 mg/dL — CL (ref 70–99)
Glucose-Capillary: 72 mg/dL (ref 70–99)

## 2021-07-06 LAB — BRAIN NATRIURETIC PEPTIDE: B Natriuretic Peptide: 39.3 pg/mL (ref 0.0–100.0)

## 2021-07-06 LAB — RESP PANEL BY RT-PCR (FLU A&B, COVID) ARPGX2
Influenza A by PCR: NEGATIVE
Influenza B by PCR: NEGATIVE
SARS Coronavirus 2 by RT PCR: NEGATIVE

## 2021-07-06 LAB — GLUCOSE, CAPILLARY: Glucose-Capillary: 94 mg/dL (ref 70–99)

## 2021-07-06 LAB — APTT: aPTT: 29 seconds (ref 24–36)

## 2021-07-06 MED ORDER — FUROSEMIDE 40 MG PO TABS
40.0000 mg | ORAL_TABLET | Freq: Every day | ORAL | Status: DC
  Administered 2021-07-07: 40 mg via ORAL
  Filled 2021-07-06: qty 1

## 2021-07-06 MED ORDER — ASPIRIN EC 81 MG PO TBEC
81.0000 mg | DELAYED_RELEASE_TABLET | Freq: Every day | ORAL | Status: DC
  Administered 2021-07-07: 81 mg via ORAL
  Filled 2021-07-06: qty 1

## 2021-07-06 MED ORDER — LISINOPRIL 10 MG PO TABS
10.0000 mg | ORAL_TABLET | Freq: Every day | ORAL | Status: DC
  Administered 2021-07-07: 10 mg via ORAL
  Filled 2021-07-06: qty 1

## 2021-07-06 MED ORDER — BISACODYL 10 MG RE SUPP
10.0000 mg | Freq: Every day | RECTAL | Status: DC | PRN
  Filled 2021-07-06: qty 1

## 2021-07-06 MED ORDER — INSULIN ASPART 100 UNIT/ML IJ SOLN: 0.0000 [IU] | Freq: Every day | INTRAMUSCULAR | Status: DC

## 2021-07-06 MED ORDER — PREDNISOLONE ACETATE 1 % OP SUSP
1.0000 [drp] | Freq: Two times a day (BID) | OPHTHALMIC | Status: DC
  Administered 2021-07-06 – 2021-07-07 (×2): 1 [drp] via OPHTHALMIC
  Filled 2021-07-06: qty 1

## 2021-07-06 MED ORDER — HEPARIN BOLUS VIA INFUSION
6200.0000 [IU] | Freq: Once | INTRAVENOUS | Status: AC
  Administered 2021-07-06: 6200 [IU] via INTRAVENOUS
  Filled 2021-07-06: qty 6200

## 2021-07-06 MED ORDER — BRIMONIDINE TARTRATE-TIMOLOL 0.2-0.5 % OP SOLN
1.0000 [drp] | Freq: Two times a day (BID) | OPHTHALMIC | Status: DC
  Filled 2021-07-06: qty 5

## 2021-07-06 MED ORDER — ACETAMINOPHEN 325 MG PO TABS: 650.0000 mg | ORAL_TABLET | Freq: Four times a day (QID) | ORAL | Status: DC | PRN

## 2021-07-06 MED ORDER — INSULIN ASPART 100 UNIT/ML IJ SOLN: 0.0000 [IU] | Freq: Three times a day (TID) | INTRAMUSCULAR | Status: DC

## 2021-07-06 MED ORDER — HYDROCHLOROTHIAZIDE 12.5 MG PO TABS
12.5000 mg | ORAL_TABLET | Freq: Every day | ORAL | Status: DC
  Administered 2021-07-07: 12.5 mg via ORAL
  Filled 2021-07-06: qty 1

## 2021-07-06 MED ORDER — CALCITRIOL 0.25 MCG PO CAPS
0.2500 ug | ORAL_CAPSULE | Freq: Every day | ORAL | Status: DC
  Administered 2021-07-07: 0.25 ug via ORAL
  Filled 2021-07-06: qty 1

## 2021-07-06 MED ORDER — ONDANSETRON HCL 4 MG PO TABS: 4.0000 mg | ORAL_TABLET | Freq: Four times a day (QID) | ORAL | Status: DC | PRN

## 2021-07-06 MED ORDER — ATORVASTATIN CALCIUM 80 MG PO TABS
80.0000 mg | ORAL_TABLET | Freq: Every day | ORAL | Status: DC
  Administered 2021-07-07: 80 mg via ORAL
  Filled 2021-07-06: qty 1

## 2021-07-06 MED ORDER — POLYETHYLENE GLYCOL 3350 17 G PO PACK: 17.0000 g | PACK | Freq: Every day | ORAL | Status: DC | PRN

## 2021-07-06 MED ORDER — DEXTROSE 50 % IV SOLN
1.0000 | INTRAVENOUS | Status: DC | PRN
  Filled 2021-07-06: qty 50

## 2021-07-06 MED ORDER — ONDANSETRON HCL 4 MG/2ML IJ SOLN: 4.0000 mg | Freq: Four times a day (QID) | INTRAMUSCULAR | Status: DC | PRN

## 2021-07-06 MED ORDER — ACETAMINOPHEN 650 MG RE SUPP: 650.0000 mg | Freq: Four times a day (QID) | RECTAL | Status: DC | PRN

## 2021-07-06 MED ORDER — HEPARIN (PORCINE) 25000 UT/250ML-% IV SOLN
1700.0000 [IU]/h | INTRAVENOUS | Status: DC
  Administered 2021-07-06: 1700 [IU]/h via INTRAVENOUS
  Filled 2021-07-06: qty 250

## 2021-07-06 MED ORDER — LISINOPRIL-HYDROCHLOROTHIAZIDE 10-12.5 MG PO TABS: 1.0000 | ORAL_TABLET | Freq: Every day | ORAL | Status: DC

## 2021-07-06 NOTE — Assessment & Plan Note (Addendum)
Baseline

## 2021-07-06 NOTE — Progress Notes (Signed)
ANTICOAGULATION CONSULT NOTE ? ?Pharmacy Consult for heparin infusion ?Indication:  VTE treatment ? ?Allergies  ?Allergen Reactions  ? Ibuprofen Swelling  ?  LEGS  ? Cephalexin Rash  ? ? ?Patient Measurements: ?Height: 6' (182.9 cm) ?Weight: 115.7 kg (255 lb) ?IBW/kg (Calculated) : 77.6 ?Heparin Dosing Weight: 102.6 kg ? ?Vital Signs: ?Temp: 98.3 ?F (36.8 ?C) (05/13 1321) ?Temp Source: Oral (05/13 1321) ?BP: 123/71 (05/13 1530) ?Pulse Rate: 72 (05/13 1530) ? ?Labs: ?Recent Labs  ?  07/06/21 ?1326  ?HGB 10.2*  ?HCT 33.0*  ?PLT 243  ?CREATININE 1.93*  ? ? ?Estimated Creatinine Clearance: 45.4 mL/min (A) (by C-G formula based on SCr of 1.93 mg/dL (H)). ? ? ?Medical History: ?Past Medical History:  ?Diagnosis Date  ? Arthritis   ? Cataract   ? CKD (chronic kidney disease), stage III ([place])   ? Diabetes mellitus type II 2002  ? Hospitalized for very high sugars  ? Diverticulosis of colon   ? GERD (gastroesophageal reflux disease)   ? H/O  ? Heart murmur 2016  ? History of colonic polyps   ? Hyperplastic  ? Hyperlipidemia   ? Hypertension   ? Phimosis 2003  ? Repair  ? Sleep apnea   ? DOES NOT USE CPAP  ? Venous insufficiency   ? to legs  ? ? ?Medications:  ?Per chart review, no anticoagulation prior to admission ? ?Assessment: ?71 y.o. male   with HTN, diabetes, CKD, venous insufficiency comes in with concerns for leg swelling. Pharmacy consulted for heparin infusion.  ? ?Goal of Therapy:  ?Heparin level 0.3-0.7 units/ml ?Monitor platelets by anticoagulation protocol: Yes ?  ?Plan:  ?Give 6200 units bolus x 1 ?Start heparin infusion at 1700 units/hr ?Check anti-Xa level in 8 hours and daily while on heparin ?Continue to monitor H&H and platelets ? ?Tamatha Gadbois O Kyzen Horn ?07/06/2021,3:52 PM ? ? ?

## 2021-07-06 NOTE — Assessment & Plan Note (Addendum)
Baseline, follow-up outpatient ?

## 2021-07-06 NOTE — Assessment & Plan Note (Addendum)
Okay to resume home medications for antihyperglycemics, statin.  Holding aspirin now that he is on anticoagulation for DVT treatment ?

## 2021-07-06 NOTE — ED Triage Notes (Signed)
Pt was in hospital this past month for heart failure. Pt has had increased bilateral edema in his LE. Pt also stating he gets winded when he is moving. Pt with pitting edema to right and left calves and ankles.  ?

## 2021-07-06 NOTE — Hospital Course (Signed)
Adam [name] is a 71 year old male with history of heart failure preserved ejection fraction, CKD stage IIIb, hypertension, insulin-dependent diabetes mellitus, hyperlipidemia, who presents emergency department for chief concerns of worsening shortness of breath and worsening left leg swelling. ? ?Initial vitals in the emergency department showed temperature of 98.3, respiration rate of 17, heart rate of 90, blood pressure 105/64, SPO2 of 97% on room air. ? ?Serum sodium 135, potassium 4.8, chloride 105, bicarb 23, BUN of 26, serum creatinine of 1.93, GFR 36, nonfasting blood glucose 97, WBC 9.0, hemoglobin 10.2, platelets of 243. ? ?Bilateral lower extremity ultrasound has been ordered and in process. ? ?ED treatment: heparin per pharmacy. ?

## 2021-07-06 NOTE — ED Provider Notes (Signed)
? ?[facility] ?Provider Note ? ? ? None  ?  (approximate) ? ? ?History  ? ?Leg Swelling ? ? ?HPI ? ?Adam [name] is a 71 y.o. male   with HTN, diabetes, CKD, venous insufficiency comes in with concerns for leg swelling. ? ?I reviewed patient's note from 5/5 until 5/6 where patient was admitted for hypotension and leg swelling.  He had been started on Lasix 40 mg twice daily and he felt that he was potentially being little over diuresed so he went down to 40 mg daily on discharge as well as decreased his blood pressure medicine Zestoretic.  During his admissions patient's creatinine had gone up to 2.7.  Patient had ultrasound done on 5/6 that did not show positive DVT. ? ?Patient's family reports that he has not been taking his 1 blood pressure medicine due to low blood pressures but has been taking the reduced dose of Lasix.  He still has continuing swelling its been getting any larger in his lower legs as well as some exertional shortness of breath that is mild as well. ? ? ? ? ?Physical Exam  ? ?Triage Vital Signs: ?ED Triage Vitals  ?Enc Vitals Group  ?   BP 07/06/21 1321 105/64  ?   Pulse Rate 07/06/21 1321 90  ?   Resp --   ?   Temp 07/06/21 1321 98.3 ?F (36.8 ?C)  ?   Temp Source 07/06/21 1321 Oral  ?   SpO2 07/06/21 1321 97 %  ?   Weight 07/06/21 1322 255 lb (115.7 kg)  ?   Height 07/06/21 1322 6' (1.829 m)  ?   Head Circumference --   ?   Peak Flow --   ?   Pain Score 07/06/21 1322 0  ?   Pain Loc --   ?   Pain Edu? --   ?   Excl. in [place]? --   ? ? ?Most recent vital signs: ?Vitals:  ? 07/06/21 1321  ?BP: 105/64  ?Pulse: 90  ?Temp: 98.3 ?F (36.8 ?C)  ?SpO2: 97%  ? ? ? ?General: Awake, no distress.  ?CV:  Good peripheral perfusion.  ?Resp:  Normal effort.  ?Abd:  No distention.  ?Other:  Patient has bilateral leg swelling with good distal pulse with left greater than right with pitting edema bilaterally. ? ? ?ED Results / Procedures / Treatments  ? ?Labs ?(all labs ordered are listed,  but only abnormal results are displayed) ?Labs Reviewed  ?BASIC METABOLIC PANEL - Abnormal; Notable for the following components:  ?    Result Value  ? BUN 26 (*)   ? Creatinine, Ser 1.93 (*)   ? Calcium 8.7 (*)   ? GFR, Estimated 36 (*)   ? All other components within normal limits  ?CBC - Abnormal; Notable for the following components:  ? RBC 3.64 (*)   ? Hemoglobin 10.2 (*)   ? HCT 33.0 (*)   ? All other components within normal limits  ? ? ? ?EKG ? ?My interpretation of EKG: ? ?Sinus rate of 91 without any ST elevation or T wave inversions, type I AV block with occasional PAC ? ? ? ?RADIOLOGY ?I have reviewed the x-ray personally interpreted without any evidence of pneumonia. ? ? ?PROCEDURES: ? ?Critical Care performed: Yes, see critical care procedure note(s) ? ?.1-3 Lead EKG Interpretation ?Performed by: Vanessa [place], MD ?Authorized by: Vanessa [place], MD  ? ?  Interpretation: normal   ?  ECG rate:  90 ?  ECG rate assessment: normal   ?  Rhythm: sinus rhythm   ?  Ectopy: none   ?  Conduction: normal   ?.Critical Care ?Performed by: Vanessa [place], MD ?Authorized by: Vanessa Draper, MD  ? ?Critical care provider statement:  ?  Critical care time (minutes):  30 ?  Critical care was necessary to treat or prevent imminent or life-threatening deterioration of the following conditions: DVT on heparin. ?  Critical care was time spent personally by me on the following activities:  Development of treatment plan with patient or surrogate, discussions with consultants, evaluation of patient's response to treatment, examination of patient, ordering and review of laboratory studies, ordering and review of radiographic studies, ordering and performing treatments and interventions, pulse oximetry, re-evaluation of patient's condition and review of old charts ? ? ?MEDICATIONS ORDERED IN ED: ?Medications - No data to display ? ? ?IMPRESSION / MDM / ASSESSMENT AND PLAN / ED COURSE  ?I reviewed the triage vital signs and the  nursing notes. ? ?Ultrasound from 5/5  ?  ?IMPRESSION: ?Directed duplex left lower extremity positive for nonocclusive DVT ?of the femoral vein extending through the popliteal vein and into ?the peroneal veins of the calf. ?  ?  ? ?Patient comes in with concerns for increasing leg swelling.  Had ultrasound done during admission on 5/5  positive for nonocclusive DVT however now patient is having worsening leg swelling.  Patient was not started on a anticoagulation I do not see any note for reference to it in the discharge summary being positive.  We will get repeat ultrasounds to evaluate for worsening clot burden that could need thrombectomy although no skin changes at this time.  We discussed CT imaging to evaluate for pulmonary embolism but family is very hesitant due to concern for contrast affecting his kidney function.  We discussed that if the CT showed a large PE that I would recommend thrombectomy but given his stable vital signs we will add on troponin, BNP to evaluate for any evidence of right heart strain but at this time they would like to hold off on CT imaging given very minimal shortness of breath although I do presume patient probably does have some clot burden in his lungs.  Given my concern for DVT and concurrent PE I think it would be best to admit patient on IV heparin. ? ? ?CBC shows stable hemoglobin.  No white count.  BMP shows creatinine that is getting better.  Troponin and BNP are negative therefore low suspicion for right heart strain. ? ? ?We will discuss with the hospital team for admission and start heparin for DVT.  Patient denies any recent falls, hitting his head or issues with GI bleeding in the past. ? ? ?The patient is on the cardiac monitor to evaluate for evidence of arrhythmia and/or significant heart rate changes. ? ?  ? ? ?FINAL CLINICAL IMPRESSION(S) / ED DIAGNOSES  ? ?Final diagnoses:  ?Acute deep vein thrombosis (DVT) of proximal vein of left lower extremity ([place])   ?Shortness of breath  ? ? ? ?Rx / DC Orders  ? ?ED Discharge Orders   ? ? None  ? ?  ? ? ? ?Note:  This document was prepared using Dragon voice recognition software and may include unintentional dictation errors. ?  ?Vanessa Alfarata, MD ?07/06/21 1627 ? ?

## 2021-07-06 NOTE — Assessment & Plan Note (Addendum)
Asymptomatic, resolved ?Patient educated on effects of insulin administration without adequate p.o. intake ?

## 2021-07-06 NOTE — ED Notes (Signed)
RN notified provider about pt BGL. RN provided pt with oral nutrition. Pt A/Ox4, NAD, VSS ?
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
## 2021-07-06 NOTE — Assessment & Plan Note (Signed)
-   Resumed home lisinopril-hydrochlorothiazide 10-12.5 mg p.o. daily ?

## 2021-07-06 NOTE — Assessment & Plan Note (Addendum)
LLE DVT, initial study positive on 06/28/2021 however patient was discharged at that time without anticoagulation.  Presenting to ED 513 worsening swelling lower extremity, left greater than right, persistent left lower extremity DVT with decreased clot burden from prior study, initiated on heparin GTT, kept on this overnight, discharged home with oral anticoagulation, declined CTA chest as noted above.  Patient confirmed no recent surgery, long period of bedrest, long car trip/plane trip.  He does have risk factors including obesity, advised he will need at least 3 months anticoagulation ? ?

## 2021-07-07 DIAGNOSIS — I872 Venous insufficiency (chronic) (peripheral): Secondary | ICD-10-CM | POA: Diagnosis not present

## 2021-07-07 DIAGNOSIS — I824Z2 Acute embolism and thrombosis of unspecified deep veins of left distal lower extremity: Secondary | ICD-10-CM | POA: Diagnosis not present

## 2021-07-07 DIAGNOSIS — M7989 Other specified soft tissue disorders: Secondary | ICD-10-CM | POA: Diagnosis not present

## 2021-07-07 DIAGNOSIS — E162 Hypoglycemia, unspecified: Secondary | ICD-10-CM

## 2021-07-07 DIAGNOSIS — I5032 Chronic diastolic (congestive) heart failure: Secondary | ICD-10-CM | POA: Diagnosis not present

## 2021-07-07 DIAGNOSIS — N1832 Chronic kidney disease, stage 3b: Secondary | ICD-10-CM | POA: Diagnosis not present

## 2021-07-07 DIAGNOSIS — R0609 Other forms of dyspnea: Secondary | ICD-10-CM | POA: Diagnosis not present

## 2021-07-07 DIAGNOSIS — I10 Essential (primary) hypertension: Secondary | ICD-10-CM | POA: Diagnosis not present

## 2021-07-07 DIAGNOSIS — N401 Enlarged prostate with lower urinary tract symptoms: Secondary | ICD-10-CM | POA: Diagnosis not present

## 2021-07-07 LAB — HEPARIN LEVEL (UNFRACTIONATED)
Heparin Unfractionated: >1.1 IU/mL — ABNORMAL HIGH (ref 0.30–0.70)
Heparin Unfractionated: 0.68 IU/mL (ref 0.30–0.70)

## 2021-07-07 LAB — CBC
HCT: 33.2 % — ABNORMAL LOW (ref 39.0–52.0)
Hemoglobin: 10.6 g/dL — ABNORMAL LOW (ref 13.0–17.0)
MCH: 27.8 pg (ref 26.0–34.0)
MCHC: 31.9 g/dL (ref 30.0–36.0)
MCV: 87.1 fL (ref 80.0–100.0)
Platelets: 275 10*3/uL (ref 150–400)
RBC: 3.81 MIL/uL — ABNORMAL LOW (ref 4.22–5.81)
RDW: 14.9 % (ref 11.5–15.5)
WBC: 8.8 10*3/uL (ref 4.0–10.5)
nRBC: 0 % (ref 0.0–0.2)

## 2021-07-07 LAB — GLUCOSE, CAPILLARY
Glucose-Capillary: 68 mg/dL — ABNORMAL LOW (ref 70–99)
Glucose-Capillary: 115 mg/dL — ABNORMAL HIGH (ref 70–99)
Glucose-Capillary: 110 mg/dL — ABNORMAL HIGH (ref 70–99)

## 2021-07-07 LAB — BASIC METABOLIC PANEL
Anion gap: 9 (ref 5–15)
BUN: 27 mg/dL — ABNORMAL HIGH (ref 8–23)
CO2: 23 mmol/L (ref 22–32)
Calcium: 8.7 mg/dL — ABNORMAL LOW (ref 8.9–10.3)
Chloride: 104 mmol/L (ref 98–111)
Creatinine, Ser: 1.87 mg/dL — ABNORMAL HIGH (ref 0.61–1.24)
GFR, Estimated: 38 mL/min — ABNORMAL LOW (ref >60)
Glucose, Bld: 84 mg/dL (ref 70–99)
Potassium: 4 mmol/L (ref 3.5–5.1)
Sodium: 136 mmol/L (ref 135–145)

## 2021-07-07 MED ORDER — SALINE SPRAY 0.65 % NA SOLN
1.0000 | NASAL | Status: DC | PRN
Start: 2021-07-07 — End: 2021-07-07
  Administered 2021-07-07: 1 via NASAL
  Filled 2021-07-07: qty 44

## 2021-07-07 MED ORDER — HEPARIN (PORCINE) 25000 UT/250ML-% IV SOLN
1400.0000 [IU]/h | INTRAVENOUS | Status: DC
  Administered 2021-07-07: 1400 [IU]/h via INTRAVENOUS
  Filled 2021-07-07: qty 250

## 2021-07-07 MED ORDER — GUAIFENESIN-DM 100-10 MG/5ML PO SYRP
5.0000 mL | ORAL_SOLUTION | ORAL | Status: DC | PRN
Start: 2021-07-07 — End: 2021-07-07
  Administered 2021-07-07: 5 mL via ORAL
  Filled 2021-07-07: qty 10

## 2021-07-07 MED ORDER — APIXABAN (ELIQUIS) VTE STARTER PACK (10MG AND 5MG): ORAL_TABLET | ORAL | 0 refills | Status: DC

## 2021-07-07 NOTE — Discharge Summary (Signed)
Physician Discharge Summary  ?Adam [name] SVX:[id number] DOB: 1950/11/06 DOA: 07/06/2021 ? ?PCP: [name], MD ? ?Admit date: 07/06/2021 ?Discharge date: 07/07/2021 ? ?Admitted From: home ?Disposition:  home ? ?Recommendations for Outpatient Follow-up:  ?Follow up with PCP in 1-2 weeks ?Please obtain labs/tests: CBC, BMP in 1-2 weeks ?Please follow up on the following pending results: none ?Please confirm Eliquis adherence, pt was advised 3 months at least but he has other risk factors such as obesity and unprovoked DVT that may warrant 6 mos vs indefinite anticoagulation, or repeat US prior to d/c in 3 mos  ? ?Home Health: none  ?Equipment/Devices: none ? ?Discharge Condition: good  ?CODE STATUS: FULL  ?Diet recommendation:  ?Diet Orders (From admission, onward)  ? ?  Start     Ordered  ? 07/07/21 0000  Diet Carb Modified       ? 07/07/21 1147  ? 07/07/21 0000  Diet - low sodium heart healthy       ? 07/07/21 1147  ? 07/06/21 1604  Diet heart healthy/carb modified Room service appropriate? Yes; Fluid consistency: Thin  Diet effective now       ?Question Answer Comment  ?Diet-HS Snack? Nothing   ?Room service appropriate? Yes   ?Fluid consistency: Thin   ?  ? 07/06/21 1603  ? ?  ?  ? ?  ? ? ? ?Brief/Interim Summary: ?71 year old male, PMH HF PEF, CKD 3B, HTN, DM 2 insulin-dependent, HLD.  To ED 07/06/2020 chief complaint SOB, LLE swelling.  SPO2 97 on room air, afebrile, vital signs otherwise normal, serum creatinine stable, hemoglobin stable chronic anemia, bilateral lower extremity ultrasound positive for DVT.  Heparin GTT started.  Initially reporting dyspnea on exertion, CTA chest initially ordered however patient/family declined due to history of CKD, given troponin was not elevated and shortness of breath resolved was felt safe to hold off on CTA since he was anticoagulating anyway.  Had some asymptomatic hypoglycemia, blood sugar improved, due to low p.o. intake after patient had taken home insulin.   Patient did well overnight, a.m. labs WNL, morning of 07/07/2021 patient felt ready to go home, heparin GTT discontinued, contacted pharmacy about assistance getting him first month of Eliquis, patient educated on follow-up plan and felt appropriate for discharge. ? ?Consultants:  ?none ? ?Procedures:  ?none ? ? ? ? ? ?Discharge Diagnoses: ?Principal Problem: ?  DVT (deep venous thrombosis) ([place]) ?Active Problems: ?  Essential hypertension, benign ?  Chronic venous insufficiency ?  Chronic diastolic heart failure ([place]) ?  BPH (benign prostatic hyperplasia) ?  Hypoglycemia ?  Type 2 diabetes mellitus with hyperlipidemia ([place]) ?  Stage 3b chronic kidney disease ([place]) ?  DOE (dyspnea on exertion) ?  Leg swelling ? ? ? ?Assessment & Plan: ?DVT (deep venous thrombosis) (HCC) ?LLE DVT, initial study positive on 06/28/2021 however patient was discharged at that time without anticoagulation.  Presenting to ED 513 worsening swelling lower extremity, left greater than right, persistent left lower extremity DVT with decreased clot burden from prior study, initiated on heparin GTT, kept on this overnight, discharged home with oral anticoagulation, declined CTA chest as noted above.  Patient confirmed no recent surgery, long period of bedrest, long car trip/plane trip.  He does have risk factors including obesity, advised he will need at least 3 months anticoagulation ? ? ?Stage 3b chronic kidney disease ([place]) ?Baseline ? ?Essential hypertension, benign ?- Resumed home lisinopril-hydrochlorothiazide 10-12.5 mg p.o. daily ? ?Type 2 diabetes mellitus with hyperlipidemia ([place]) ?  Okay to resume home medications for antihyperglycemics, statin.  Holding aspirin now that he is on anticoagulation for DVT treatment ? ?DOE (dyspnea on exertion) ?Baseline, follow-up outpatient ? ?Hypoglycemia ?Asymptomatic, resolved ?Patient educated on effects of insulin administration without adequate p.o. intake ? ? ? ? ? ?Discharge  Instructions ? ?Discharge Instructions   ? ? Diet - low sodium heart healthy   Complete by: As directed ?  ? Diet Carb Modified   Complete by: As directed ?  ? Discharge instructions   Complete by: As directed ?  ? Starting Eliquis blood thinner. STOP aspirin while on this medication. Please follow up with your PCP for further refills on this Rx - you'll need AT LEAST 3 months of treatment for blood clot.  ? Increase activity slowly   Complete by: As directed ?  ? ?  ? ?Allergies as of 07/07/2021   ? ?   Reactions  ? Ibuprofen Swelling  ? LEGS  ? Cephalexin Rash  ? ?  ? ?  ?Medication List  ?  ? ?STOP taking these medications   ? ?aspirin 81 MG tablet ?  ? ?  ? ?TAKE these medications   ? ?Accu-Chek Aviva Plus test strip ?Generic drug: glucose blood ?Use to check blood sugar twice daily. Dx Code P71.0626 ?  ?Accu-Chek Aviva Soln ?  ?acetaminophen 500 MG tablet ?Commonly known as: TYLENOL ?Take 1,000 mg by mouth every 6 (six) hours as needed for moderate pain. ?  ?Apixaban Starter Pack ('10mg'$  and '5mg'$ ) ?Commonly known as: ELIQUIS STARTER PACK ?Take as directed on package: start with two-'5mg'$  tablets twice daily for 7 days. On day 8, switch to one-'5mg'$  tablet twice daily. ?  ?atorvastatin 80 MG tablet ?Commonly known as: LIPITOR ?TAKE 1 TABLET EVERY DAY ?  ?calcitRIOL 0.25 MCG capsule ?Commonly known as: ROCALTROL ?Take 0.25 mcg by mouth daily. ?  ?Combigan 0.2-0.5 % ophthalmic solution ?Generic drug: brimonidine-timolol ?Place 1 drop into both eyes 2 (two) times daily. ?  ?empagliflozin 10 MG Tabs tablet ?Commonly known as: JARDIANCE ?Take 1 tablet (10 mg total) by mouth daily before breakfast. ?  ?fluticasone 50 MCG/ACT nasal spray ?Commonly known as: FLONASE ?Place 2 sprays into both nostrils daily. ?  ?furosemide 40 MG tablet ?Commonly known as: LASIX ?Take 1 tablet (40 mg total) by mouth daily. Per Pt: Does not take everyday ?  ?HumaLOG Mix 75/25 (75-25) 100 UNIT/ML Susp injection ?Generic drug: insulin lispro  protamine-lispro ?Inject 50-60 Units into the skin 2 (two) times daily with a meal. ?  ?lisinopril-hydrochlorothiazide 10-12.5 MG tablet ?Commonly known as: Zestoretic ?Take 1 tablet by mouth daily. ?  ?prednisoLONE acetate 1 % ophthalmic suspension ?Commonly known as: PRED FORTE ?Place 1 drop into the right eye 2 (two) times daily. ?  ?triamcinolone cream 0.1 % ?Commonly known as: KENALOG ?Apply 1 application. topically 2 (two) times daily. ?  ?Trulicity 3 RS/8.5IO Sopn ?Generic drug: Dulaglutide ?INJECT '3MG'$  UNDER THE SKIN AS DIRECTED ONCE WEEKLY ?  ?Vitamin D 50 MCG (2000 UT) tablet ?Take 2,000 Units by mouth daily. ?  ? ?  ? ?Diet Orders (From admission, onward)  ? ?  Start     Ordered  ? 07/07/21 0000  Diet Carb Modified       ? 07/07/21 1147  ? 07/07/21 0000  Diet - low sodium heart healthy       ? 07/07/21 1147  ? 07/06/21 1604  Diet heart healthy/carb modified Room service appropriate? Yes; Fluid consistency: Thin  Diet effective now       ?Question Answer Comment  ?Diet-HS Snack? Nothing   ?Room service appropriate? Yes   ?Fluid consistency: Thin   ?  ? 07/06/21 1603  ? ?  ?  ? ?  ? ? ? ? ?Allergies  ?Allergen Reactions  ? Ibuprofen Swelling  ?  LEGS  ? Cephalexin Rash  ? ? ? ?Subjective: Patient seen and examined this morning in hospital room, sitting in chair at bedside, reports he is feeling well, no shortness of breath, leg pain has alleviated compared to yesterday. ? ? ?Discharge Exam: ?Vitals:  ? 07/07/21 0756 07/07/21 1102  ?BP: 113/84 116/79  ?Pulse: 99 (!) 102  ?Resp: 17   ?Temp: 97.8 ?F (36.6 ?C) 98.8 ?F (37.1 ?C)  ?SpO2: 93% 99%  ? ?Vitals:  ? 07/06/21 2304 07/07/21 0350 07/07/21 0756 07/07/21 1102  ?BP: (!) 151/85 129/78 113/84 116/79  ?Pulse: 85 93 99 (!) 102  ?Resp: '20 20 17   '$ ?Temp: 98.9 ?F (37.2 ?C) 98.2 ?F (36.8 ?C) 97.8 ?F (36.6 ?C) 98.8 ?F (37.1 ?C)  ?TempSrc:  Oral    ?SpO2: 98% 95% 93% 99%  ?Weight:      ?Height:      ? ? ?General: Pt is alert, awake, not in acute  distress ?Cardiovascular: RRR, S1/S2 +, no rubs, no gallops ?Respiratory: CTA bilaterally, no wheezing, no rhonchi ?Abdominal: Soft, NT, ND, bowel sounds + ?Extremities: no edema, no cyanosis ? ? ? ? ?The results of significant diagnostics from this hospi

## 2021-07-07 NOTE — Progress Notes (Signed)
ANTICOAGULATION CONSULT NOTE ? ?Pharmacy Consult for heparin infusion ?Indication:  VTE treatment ? ?Allergies  ?Allergen Reactions  ? Ibuprofen Swelling  ?  LEGS  ? Cephalexin Rash  ? ? ?Patient Measurements: ?Height: 6' (182.9 cm) ?Weight: 115.7 kg (255 lb) ?IBW/kg (Calculated) : 77.6 ?Heparin Dosing Weight: 102.6 kg ? ?Vital Signs: ?Temp: 98.9 ?F (37.2 ?C) (05/13 2304) ?BP: 151/85 (05/13 2304) ?Pulse Rate: 85 (05/13 2304) ? ?Labs: ?Recent Labs  ?  07/06/21 ?1326 07/06/21 ?1609 07/07/21 ?0104  ?HGB 10.2*  --  10.6*  ?HCT 33.0*  --  33.2*  ?PLT 243  --  275  ?APTT  --  29  --   ?LABPROT  --  14.0  --   ?INR  --  1.1  --   ?HEPARINUNFRC  --   --  >1.10*  ?CREATININE 1.93*  --  1.87*  ?TROPONINIHS 16  --   --   ? ? ? ?Estimated Creatinine Clearance: 46.9 mL/min (A) (by C-G formula based on SCr of 1.87 mg/dL (H)). ? ? ?Medical History: ?Past Medical History:  ?Diagnosis Date  ? Arthritis   ? Cataract   ? CKD (chronic kidney disease), stage III ([place])   ? Diabetes mellitus type II 2002  ? Hospitalized for very high sugars  ? Diverticulosis of colon   ? GERD (gastroesophageal reflux disease)   ? H/O  ? Heart murmur 2016  ? History of colonic polyps   ? Hyperplastic  ? Hyperlipidemia   ? Hypertension   ? Phimosis 2003  ? Repair  ? Sleep apnea   ? DOES NOT USE CPAP  ? Venous insufficiency   ? to legs  ? ? ?Medications:  ?Per chart review, no anticoagulation prior to admission ? ?Assessment: ?71 y.o. male   with HTN, diabetes, CKD, venous insufficiency comes in with concerns for leg swelling. Pharmacy consulted for heparin infusion.  ? ?5/14 0104 HL > 1.1  ? ?Goal of Therapy:  ?Heparin level 0.3-0.7 units/ml ?Monitor platelets by anticoagulation protocol: Yes ?  ?Plan:  ?Heparin level is supratherapeutic. Will hold heparin for 1 hour and restart at 1400 units/hr. Recheck heparin level in 8 hours. CBC daily while on heparin.  ? ? ?Oswald Hillock, PharmD, BCPS ?07/07/2021,2:30 AM ? ? ?

## 2021-07-07 NOTE — Progress Notes (Signed)
ANTICOAGULATION CONSULT NOTE ? ?Pharmacy Consult for heparin infusion ?Indication:  VTE treatment ? ?Allergies  ?Allergen Reactions  ? Ibuprofen Swelling  ?  LEGS  ? Cephalexin Rash  ? ? ?Patient Measurements: ?Height: 6' (182.9 cm) ?Weight: 115.7 kg (255 lb) ?IBW/kg (Calculated) : 77.6 ?Heparin Dosing Weight: 102.6 kg ? ?Vital Signs: ?Temp: 98.8 ?F (37.1 ?C) (05/14 1102) ?Temp Source: Oral (05/14 0350) ?BP: 116/79 (05/14 1102) ?Pulse Rate: 102 (05/14 1102) ? ?Labs: ?Recent Labs  ?  07/06/21 ?1326 07/06/21 ?1609 07/07/21 ?0104 07/07/21 ?1610  ?HGB 10.2*  --  10.6*  --   ?HCT 33.0*  --  33.2*  --   ?PLT 243  --  275  --   ?APTT  --  29  --   --   ?LABPROT  --  14.0  --   --   ?INR  --  1.1  --   --   ?HEPARINUNFRC  --   --  >1.10* 0.68  ?CREATININE 1.93*  --  1.87*  --   ?TROPONINIHS 16  --   --   --   ? ? ? ?Estimated Creatinine Clearance: 46.9 mL/min (A) (by C-G formula based on SCr of 1.87 mg/dL (H)). ? ? ?Medical History: ?Past Medical History:  ?Diagnosis Date  ? Arthritis   ? Cataract   ? CKD (chronic kidney disease), stage III ([place])   ? Diabetes mellitus type II 2002  ? Hospitalized for very high sugars  ? Diverticulosis of colon   ? GERD (gastroesophageal reflux disease)   ? H/O  ? Heart murmur 2016  ? History of colonic polyps   ? Hyperplastic  ? Hyperlipidemia   ? Hypertension   ? Phimosis 2003  ? Repair  ? Sleep apnea   ? DOES NOT USE CPAP  ? Venous insufficiency   ? to legs  ? ? ?Medications:  ?Per chart review, no anticoagulation prior to admission ? ?Assessment: ?71 y.o. male   with HTN, diabetes, CKD, venous insufficiency comes in with concerns for leg swelling. Pharmacy consulted for heparin infusion.  ? ?5/14 0104 HL > 1.1  ?5/14 1149 HL= 0.68 ? ?Goal of Therapy:  ?Heparin level 0.3-0.7 units/ml ?Monitor platelets by anticoagulation protocol: Yes ?  ?Plan:  ?5/14 1149 HL= 0.68 ?Heparin level is therapeutic. Will continue at 1400 units/hr. Confirmatory heparin level in 8 hours. CBC daily while on  heparin.  ? ? ?Noralee Space, PharmD ?07/07/2021,12:28 PM ? ? ?

## 2021-07-08 ENCOUNTER — Telehealth: Payer: Self-pay

## 2021-07-08 ENCOUNTER — Other Ambulatory Visit (HOSPITAL_BASED_OUTPATIENT_CLINIC_OR_DEPARTMENT_OTHER): Payer: Self-pay | Admitting: Osteopathic Medicine

## 2021-07-08 NOTE — Telephone Encounter (Signed)
He was admitted overnight. ?I will assess him at follow up tomorrow ?

## 2021-07-08 NOTE — Telephone Encounter (Signed)
Per chart review tab pt was seen on 07/06/21 at [facility] ED and admitted for observation and D/C on 07/07/21. Pt already has appt to see Dr Silvio Pate on 07/09/21 at 8:45. Sending note to Dr Silvio Pate and Larene Beach CMA ans will teams [facility] ?

## 2021-07-08 NOTE — Telephone Encounter (Signed)
[facility] Night - Client ?TELEPHONE ADVICE RECORD ?[facility]? ?Patient ?Name: ?Adam [name] ?Gender: Male ?DOB: [birth year] ?Age: 71 Y 5 M 26 D ?Return ?Phone ?Number: ?[phone] ?(Primary), ?[phone] ?(Secondary) ?Address: ?City/ ?State/ ?Zip: [place] ?[address] ?Client [facility] Night - Client ?Client Site [place] ?Provider Viviana Simpler- MD ?Contact Type Call ?Who Is Calling Patient / Member / Family / Caregiver ?Call Type Triage / Clinical ?Caller Name Dameon Soltis ?Relationship To Patient Daughter ?Return Phone Number [phone] (Primary) ?Chief Complaint Heart palpitations or irregular heartbeat ?Reason for Call Symptomatic / Request for Health Information ?Initial Comment Caller states her father has been having extreme ?swelling to feet pitted edema +3. Blood pressure ?121/78 hr 100 ?Translation No ?Nurse Assessment ?Nurse: White, RN, Merleen Nicely Date/Time (Eastern Time): 07/06/2021 12:05:06 PM ?Confirm and document reason for call. If ?symptomatic, describe symptoms. ?---Caller stated her Dads feet started swelling 3 days ?ago and got worse 3 days ago. Swelling is located in ?BIL ankles and top of foot up to the calf. States it is 3+ ?pitting edema in ankle and 2 + in the calf. States tender ?to touch not hot. Also states he has been having fatigue ?since D/C from hospital May 6 for hypotension. last bp ?121/78 HR 100 slight SOB. ?Does the patient have any new or worsening ?symptoms? ---Yes ?Will a triage be completed? ---Yes ?Related visit to physician within the last 2 weeks? ---No ?Does the PT have any chronic conditions? (i.e. ?diabetes, asthma, this includes High risk factors for ?pregnancy, etc.) ?---Yes ?List chronic conditions. ---Diabetes, HTN, ?Is this a behavioral health or substance abuse call? ---No ?Guidelines ?Guideline Title Affirmed Question Affirmed Notes Nurse Date/Time (Eastern ?Time) ?Heart  Failure PostHospitalization ?Follow-up Call ?[1] Thigh, calf, or ?ankle swelling AND ?[2] bilateral AND ?[3] 1 side is more ?swollen ?Dema Severin, RN, Merleen Nicely 07/06/2021 12:13:23 ?PM ?PLEASE NOTE: All timestamps contained within this report are represented as [place] Standard Time. ?CONFIDENTIALTY NOTICE: This fax transmission is intended only for the addressee. It contains information that is legally privileged, confidential or ?otherwise protected from use or disclosure. If you are not the intended recipient, you are strictly prohibited from reviewing, disclosing, copying using ?or disseminating any of this information or taking any action in reliance on or regarding this information. If you have received this fax in error, please ?notify us immediately by telephone so that we can arrange for its return to [place]. Phone: [phone], Toll-Free: [phone], Fax: [phone] ?Page: 2 of 2 ?Call Id: [id number] ?Disp. Time (Eastern ?Time) Disposition Final User ?07/06/2021 12:19:32 PM See HCP within 4 Hours (or ?PCP triage) ?Yes White, RN, Merleen Nicely ?Caller Disagree/Comply Disagree ?Caller Understands Yes ?PreDisposition Call Doctor ?Care Advice Given Per Guideline ?SEE HCP (OR PCP TRIAGE) WITHIN 4 HOURS: * IF OFFICE WILL BE CLOSED AND NO PCP (PRIMARY CARE ?PROVIDER) SECOND-LEVEL TRIAGE: You need to be seen within the next 3 or 4 hours. A nearby Ur[facility]) is ?often a good source of care. Another choice is to go to the ED. Go sooner if you become worse. BRING MEDICINES: * Please bring ?a list of your current medicines when you go to see the doctor. * It is also a good idea to bring the pill bottles too. This will help the ?doctor to make certain you are taking the right medicines and the right dose. CALL BACK IF: * You become worse CARE ADVICE ?given per Heart Failure Post-Hospitalization Follow-Up  Call (Adult) guideline. ?Comments ?User: Lavona Mound, RN Date/Time Eilene Ghazi Time): 07/06/2021 12:11:54 PM ?Has  cardiology Appt on 26th ?User: Lavona Mound, RN Date/Time Eilene Ghazi Time): 07/06/2021 12:13:22 PM ?Has recent HF diagnoses ?User: Lavona Mound, RN Date/Time Eilene Ghazi Time): 07/06/2021 12:19:31 PM ?Callers daughter agreed but patient may be unwilling. ?Referrals ?GO TO FACILITY UNDECIDE ?

## 2021-07-09 ENCOUNTER — Ambulatory Visit: Payer: Medicare PPO | Admitting: Internal Medicine

## 2021-07-09 ENCOUNTER — Encounter: Payer: Self-pay | Admitting: Internal Medicine

## 2021-07-09 VITALS — BP 102/68 | HR 90 | Temp 97.4°F | Ht 71.0 in | Wt 255.0 lb

## 2021-07-09 DIAGNOSIS — I10 Essential (primary) hypertension: Secondary | ICD-10-CM | POA: Diagnosis not present

## 2021-07-09 DIAGNOSIS — E1142 Type 2 diabetes mellitus with diabetic polyneuropathy: Secondary | ICD-10-CM | POA: Diagnosis not present

## 2021-07-09 DIAGNOSIS — I82402 Acute embolism and thrombosis of unspecified deep veins of left lower extremity: Secondary | ICD-10-CM | POA: Diagnosis not present

## 2021-07-09 DIAGNOSIS — K2100 Gastro-esophageal reflux disease with esophagitis, without bleeding: Secondary | ICD-10-CM | POA: Diagnosis not present

## 2021-07-09 MED ORDER — FUROSEMIDE 40 MG PO TABS: 40.0000 mg | ORAL_TABLET | Freq: Every day | ORAL | 0 refills | Status: DC | PRN

## 2021-07-09 MED ORDER — APIXABAN 5 MG PO TABS: 5.0000 mg | ORAL_TABLET | Freq: Two times a day (BID) | ORAL | 5 refills | Status: DC

## 2021-07-09 MED ORDER — OMEPRAZOLE 20 MG PO CPDR: 20.0000 mg | DELAYED_RELEASE_CAPSULE | Freq: Every day | ORAL | 3 refills | Status: DC

## 2021-07-09 NOTE — Assessment & Plan Note (Signed)
His cough and throat symptoms likely from this ?Start omeprazole 20 ?

## 2021-07-09 NOTE — Assessment & Plan Note (Signed)
BP Readings from Last 3 Encounters:  ?07/09/21 102/68  ?07/07/21 123/71  ?06/29/21 120/71  ? ?Running low ?Will stop the lisinopril/HCTZ for now ?Probably restart just low dose lisinopril if his pressure goes up ?
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
## 2021-07-09 NOTE — Assessment & Plan Note (Signed)
Having serious hypoglycemia ? ?Will permanently stop the 75/25 ?Continue trulicity '3mg'$  weekly and jardiance 10 ?Will restart insulin with just long acting if sugars go back up (lantus or equivalent) ?

## 2021-07-09 NOTE — Assessment & Plan Note (Signed)
Is on eliquis now 10 bid---5 bid after 1 week ?At increased risk of recurrence ?Will set up with hematology for consultation about length of therapy (??low dose indefinitely) ?May have had PE as well (with hypotension, tachycardia, etc) ?

## 2021-07-17 DIAGNOSIS — E113513 Type 2 diabetes mellitus with proliferative diabetic retinopathy with macular edema, bilateral: Secondary | ICD-10-CM | POA: Diagnosis not present

## 2021-07-19 ENCOUNTER — Inpatient Hospital Stay: Payer: Medicare PPO | Attending: Oncology | Admitting: Oncology

## 2021-07-19 ENCOUNTER — Ambulatory Visit: Payer: Medicare PPO | Admitting: Cardiovascular Disease

## 2021-07-19 ENCOUNTER — Encounter: Payer: Self-pay | Admitting: Oncology

## 2021-07-19 ENCOUNTER — Encounter: Payer: Self-pay | Admitting: Cardiovascular Disease

## 2021-07-19 ENCOUNTER — Inpatient Hospital Stay: Payer: Medicare PPO

## 2021-07-19 VITALS — BP 114/72 | HR 87 | Ht 71.0 in | Wt 255.0 lb

## 2021-07-19 VITALS — BP 112/72 | HR 94 | Temp 98.2°F | Resp 16 | Ht 71.0 in | Wt 255.9 lb

## 2021-07-19 DIAGNOSIS — E1122 Type 2 diabetes mellitus with diabetic chronic kidney disease: Secondary | ICD-10-CM | POA: Diagnosis not present

## 2021-07-19 DIAGNOSIS — N179 Acute kidney failure, unspecified: Secondary | ICD-10-CM | POA: Diagnosis not present

## 2021-07-19 DIAGNOSIS — E785 Hyperlipidemia, unspecified: Secondary | ICD-10-CM

## 2021-07-19 DIAGNOSIS — I129 Hypertensive chronic kidney disease with stage 1 through stage 4 chronic kidney disease, or unspecified chronic kidney disease: Secondary | ICD-10-CM | POA: Diagnosis not present

## 2021-07-19 DIAGNOSIS — Z87891 Personal history of nicotine dependence: Secondary | ICD-10-CM | POA: Insufficient documentation

## 2021-07-19 DIAGNOSIS — Z881 Allergy status to other antibiotic agents status: Secondary | ICD-10-CM | POA: Insufficient documentation

## 2021-07-19 DIAGNOSIS — N200 Calculus of kidney: Secondary | ICD-10-CM | POA: Insufficient documentation

## 2021-07-19 DIAGNOSIS — R42 Dizziness and giddiness: Secondary | ICD-10-CM | POA: Diagnosis not present

## 2021-07-19 DIAGNOSIS — Z8249 Family history of ischemic heart disease and other diseases of the circulatory system: Secondary | ICD-10-CM | POA: Insufficient documentation

## 2021-07-19 DIAGNOSIS — Z8379 Family history of other diseases of the digestive system: Secondary | ICD-10-CM | POA: Insufficient documentation

## 2021-07-19 DIAGNOSIS — I872 Venous insufficiency (chronic) (peripheral): Secondary | ICD-10-CM | POA: Diagnosis not present

## 2021-07-19 DIAGNOSIS — R600 Localized edema: Secondary | ICD-10-CM | POA: Insufficient documentation

## 2021-07-19 DIAGNOSIS — Z8719 Personal history of other diseases of the digestive system: Secondary | ICD-10-CM | POA: Insufficient documentation

## 2021-07-19 DIAGNOSIS — Z886 Allergy status to analgesic agent status: Secondary | ICD-10-CM | POA: Diagnosis not present

## 2021-07-19 DIAGNOSIS — Z833 Family history of diabetes mellitus: Secondary | ICD-10-CM | POA: Insufficient documentation

## 2021-07-19 DIAGNOSIS — R0602 Shortness of breath: Secondary | ICD-10-CM | POA: Insufficient documentation

## 2021-07-19 DIAGNOSIS — Z86718 Personal history of other venous thrombosis and embolism: Secondary | ICD-10-CM | POA: Diagnosis not present

## 2021-07-19 DIAGNOSIS — N3289 Other specified disorders of bladder: Secondary | ICD-10-CM | POA: Insufficient documentation

## 2021-07-19 DIAGNOSIS — Z818 Family history of other mental and behavioral disorders: Secondary | ICD-10-CM | POA: Insufficient documentation

## 2021-07-19 DIAGNOSIS — C641 Malignant neoplasm of right kidney, except renal pelvis: Secondary | ICD-10-CM | POA: Diagnosis not present

## 2021-07-19 DIAGNOSIS — N1832 Chronic kidney disease, stage 3b: Secondary | ICD-10-CM | POA: Insufficient documentation

## 2021-07-19 DIAGNOSIS — I82412 Acute embolism and thrombosis of left femoral vein: Secondary | ICD-10-CM | POA: Diagnosis not present

## 2021-07-19 DIAGNOSIS — Z7901 Long term (current) use of anticoagulants: Secondary | ICD-10-CM | POA: Insufficient documentation

## 2021-07-19 DIAGNOSIS — Z79899 Other long term (current) drug therapy: Secondary | ICD-10-CM | POA: Insufficient documentation

## 2021-07-19 DIAGNOSIS — I5032 Chronic diastolic (congestive) heart failure: Secondary | ICD-10-CM | POA: Diagnosis not present

## 2021-07-19 DIAGNOSIS — N281 Cyst of kidney, acquired: Secondary | ICD-10-CM | POA: Insufficient documentation

## 2021-07-19 DIAGNOSIS — R339 Retention of urine, unspecified: Secondary | ICD-10-CM | POA: Insufficient documentation

## 2021-07-19 DIAGNOSIS — R0609 Other forms of dyspnea: Secondary | ICD-10-CM

## 2021-07-19 NOTE — Addendum Note (Signed)
Addended by: Earlie Server on: 07/19/2021 11:21 PM   Modules accepted: Orders

## 2021-07-19 NOTE — Progress Notes (Signed)
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 07/19/2021

## 2021-07-19 NOTE — Patient Instructions (Signed)
Medication Instructions:  Your physician recommends that you continue on your current medications as directed. Please refer to the Current Medication list given to you today.  *If you need a refill on your cardiac medications before your next appointment, please call your pharmacy*   Lab Work: None ordered If you have labs (blood work) drawn today and your tests are completely normal, you will receive your results only by: [place] (if you have MyChart) OR A paper copy in the mail If you have any lab test that is abnormal or we need to change your treatment, we will call you to review the results.   Testing/Procedures: Your physician has requested that you have a lexiscan myoview. For further information please visit [email or web address]. Please follow instruction sheet, as given.    Follow-Up: At [facility], you and your health needs are our priority.  As part of our continuing mission to provide you with exceptional heart care, we have created designated Provider Care Teams.  These Care Teams include your primary Cardiologist (physician) and Advanced Practice Providers (APPs -  Physician Assistants and Nurse Practitioners) who all work together to provide you with the care you need, when you need it.  We recommend signing up for the patient portal called "MyChart".  Sign up information is provided on this After Visit Summary.  MyChart is used to connect with patients for Virtual Visits (Telemedicine).  Patients are able to view lab/test results, encounter notes, upcoming appointments, etc.  Non-urgent messages can be sent to your provider as well.   To learn more about what you can do with MyChart, go to [email or web address].    Your next appointment:   3 month(s)  The format for your next appointment:   In Person  Provider:   You may see Kathlyn Sacramento, MD or one of the following Advanced Practice Providers on your designated Care Team:   Murray Hodgkins, NP Christell Faith, PA-C Cadence Kathlen Mody, PA-C{    Other Instructions [place]  Your caregiver has ordered a Stress Test with nuclear imaging. The purpose of this test is to evaluate the blood supply to your heart muscle. This procedure is referred to as a "Non-Invasive Stress Test." This is because other than having an IV started in your vein, nothing is inserted or "invades" your body. Cardiac stress tests are done to find areas of poor blood flow to the heart by determining the extent of coronary artery disease (CAD). Some patients exercise on a treadmill, which naturally increases the blood flow to your heart, while others who are  unable to walk on a treadmill due to physical limitations have a pharmacologic/chemical stress agent called Lexiscan . This medicine will mimic walking on a treadmill by temporarily increasing your coronary blood flow.   Please note: these test may take anywhere between 2-4 hours to complete  PLEASE REPORT TO [facility] AT THE FIRST DESK WILL DIRECT YOU WHERE TO GO  Date of Procedure:_____________________________________  Arrival Time for Procedure:______________________________  Instructions regarding medication:   __X__ : Hold diabetes medication morning of procedure  __X__:  Hold other medications as follows: HOLD Lasix the morning of the test. Ok to take after.   PLEASE NOTIFY THE OFFICE AT LEAST 9 HOURS IN ADVANCE IF YOU ARE UNABLE TO KEEP YOUR APPOINTMENT.  [phone] AND  PLEASE NOTIFY NUCLEAR MEDICINE AT [facility] AT LEAST 24 HOURS IN ADVANCE IF YOU ARE UNABLE TO KEEP YOUR APPOINTMENT. [phone]  How to prepare  for your Myoview test:  Do not eat or drink after midnight No caffeine for 24 hours prior to test No smoking 24 hours prior to test. Your medication may be taken with water.  If your doctor stopped a medication because of this test, do not take that medication. Please wear a short sleeve shirt. No cologne or  lotion. Wear comfortable walking shoes.        Important Information About Sugar
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
## 2021-07-23 ENCOUNTER — Encounter: Payer: Self-pay | Admitting: Internal Medicine

## 2021-07-23 ENCOUNTER — Telehealth: Payer: Self-pay

## 2021-07-23 ENCOUNTER — Other Ambulatory Visit: Payer: Self-pay

## 2021-07-23 ENCOUNTER — Ambulatory Visit: Payer: Medicare PPO | Admitting: Internal Medicine

## 2021-07-23 DIAGNOSIS — I82401 Acute embolism and thrombosis of unspecified deep veins of right lower extremity: Secondary | ICD-10-CM

## 2021-07-23 DIAGNOSIS — G479 Sleep disorder, unspecified: Secondary | ICD-10-CM

## 2021-07-23 DIAGNOSIS — I5032 Chronic diastolic (congestive) heart failure: Secondary | ICD-10-CM | POA: Diagnosis not present

## 2021-07-23 DIAGNOSIS — E1142 Type 2 diabetes mellitus with diabetic polyneuropathy: Secondary | ICD-10-CM | POA: Diagnosis not present

## 2021-07-23 DIAGNOSIS — N1832 Chronic kidney disease, stage 3b: Secondary | ICD-10-CM

## 2021-07-23 MED ORDER — LANTUS SOLOSTAR 100 UNIT/ML ~~LOC~~ SOPN
40.0000 [IU] | PEN_INJECTOR | Freq: Every day | SUBCUTANEOUS | 3 refills | Status: DC
Start: 2021-07-23 — End: 2021-12-25
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
## 2021-07-23 NOTE — Assessment & Plan Note (Signed)
Still having trouble sleeping Discussed melatonin '3mg'$  or up to '6mg'$ 

## 2021-07-23 NOTE — Assessment & Plan Note (Signed)
Seems to be compensated low Off lisinopril HCTZ still due to hypotension Is on the jardiance '10mg'$  though

## 2021-07-23 NOTE — Assessment & Plan Note (Signed)
Reviewed Dr Collie Siad consult Will continue the eliquis 5 bid She has ordered hypercoag work up Likely will need to continue on lower dose eliquis after the 3 months

## 2021-07-23 NOTE — Assessment & Plan Note (Signed)
He didn't stop the insulin as we discussed but much lower dose (25 bid) On trulicity '3mg'$  weekly and jardiance 10 also Will start lantus (or equivalent) at 40 units daily

## 2021-07-23 NOTE — Telephone Encounter (Signed)
-----   Message from Earlie Server, MD sent at 07/19/2021 11:18 PM EDT ----- He has history of kidney cancer [somehow not included in his PMH list], please let him know that I recommend him to get MRI abdomen w wo contrast for surveillance.  Please arrange. Thanks.   Talbert Cage

## 2021-07-23 NOTE — Telephone Encounter (Signed)
Please schedule patient for MRI abdomen w wo contrast next available. Please notify patient of appt.

## 2021-07-23 NOTE — Patient Instructions (Signed)
Change from the current insulin to the once a day insulin when you get it from the pharmacy. Take only 40 unilts but let me know if your fasting sugars are regularly over 150.  You can try over the counter melatonin to see if that helps you sleep. Start with '3mg'$  but you can double it to '6mg'$  if it doesn't help over a few days.

## 2021-07-24 ENCOUNTER — Telehealth: Payer: Self-pay

## 2021-07-24 NOTE — Telephone Encounter (Signed)
-----   Message from Stephens November sent at 07/23/2021  9:00 AM EDT ----- MRI has been scheduled, pt wife has been notified  ----- Message ----- From: Earlie Server, MD Sent: 07/19/2021  11:20 PM EDT To: Evelina Dun, RN, Stephens November, #  He has history of kidney cancer [somehow not included in his PMH list], please let him know that I recommend him to get MRI abdomen w wo contrast for surveillance.  Please arrange. Thanks.   Talbert Cage

## 2021-07-26 ENCOUNTER — Ambulatory Visit: Payer: Medicare PPO

## 2021-08-02 ENCOUNTER — Ambulatory Visit
Admission: RE | Admit: 2021-08-02 | Discharge: 2021-08-02 | Disposition: A | Discharge status: Home / Self Care | Payer: Medicare PPO | Source: Ambulatory Visit | Attending: Cardiovascular Disease | Admitting: Cardiovascular Disease

## 2021-08-02 ENCOUNTER — Other Ambulatory Visit: Payer: Self-pay | Admitting: Interventional Radiology

## 2021-08-02 DIAGNOSIS — R0609 Other forms of dyspnea: Secondary | ICD-10-CM | POA: Insufficient documentation

## 2021-08-02 DIAGNOSIS — N2889 Other specified disorders of kidney and ureter: Secondary | ICD-10-CM

## 2021-08-02 MED ORDER — TECHNETIUM TC 99M TETROFOSMIN IV KIT
10.0000 | PACK | Freq: Once | INTRAVENOUS | Status: AC | PRN
  Administered 2021-08-02: 10.46 via INTRAVENOUS

## 2021-08-02 MED ORDER — TECHNETIUM TC 99M TETROFOSMIN IV KIT
28.9600 | PACK | Freq: Once | INTRAVENOUS | Status: AC | PRN
  Administered 2021-08-02: 28.96 via INTRAVENOUS

## 2021-08-02 MED ORDER — REGADENOSON 0.4 MG/5ML IV SOLN
0.4000 mg | Freq: Once | INTRAVENOUS | Status: AC
  Administered 2021-08-02: 0.4 mg via INTRAVENOUS
  Filled 2021-08-02: qty 5

## 2021-08-05 ENCOUNTER — Ambulatory Visit
Admission: RE | Admit: 2021-08-05 | Discharge: 2021-08-05 | Disposition: A | Discharge status: Home / Self Care | Payer: Medicare PPO | Source: Ambulatory Visit | Attending: Oncology | Admitting: Oncology

## 2021-08-05 DIAGNOSIS — N281 Cyst of kidney, acquired: Secondary | ICD-10-CM | POA: Diagnosis not present

## 2021-08-05 DIAGNOSIS — N1832 Chronic kidney disease, stage 3b: Secondary | ICD-10-CM | POA: Diagnosis not present

## 2021-08-05 DIAGNOSIS — E1122 Type 2 diabetes mellitus with diabetic chronic kidney disease: Secondary | ICD-10-CM | POA: Diagnosis not present

## 2021-08-05 DIAGNOSIS — K862 Cyst of pancreas: Secondary | ICD-10-CM | POA: Diagnosis not present

## 2021-08-05 DIAGNOSIS — D631 Anemia in chronic kidney disease: Secondary | ICD-10-CM | POA: Diagnosis not present

## 2021-08-05 DIAGNOSIS — R809 Proteinuria, unspecified: Secondary | ICD-10-CM | POA: Diagnosis not present

## 2021-08-05 DIAGNOSIS — I10 Essential (primary) hypertension: Secondary | ICD-10-CM | POA: Diagnosis not present

## 2021-08-05 DIAGNOSIS — C649 Malignant neoplasm of unspecified kidney, except renal pelvis: Secondary | ICD-10-CM | POA: Diagnosis not present

## 2021-08-05 DIAGNOSIS — I509 Heart failure, unspecified: Secondary | ICD-10-CM | POA: Diagnosis not present

## 2021-08-05 DIAGNOSIS — N2581 Secondary hyperparathyroidism of renal origin: Secondary | ICD-10-CM | POA: Diagnosis not present

## 2021-08-05 MED ORDER — GADOBUTROL 1 MMOL/ML IV SOLN
10.0000 mL | Freq: Once | INTRAVENOUS | Status: AC | PRN
  Administered 2021-08-05: 10 mL via INTRAVENOUS

## 2021-08-07 DIAGNOSIS — R809 Proteinuria, unspecified: Secondary | ICD-10-CM | POA: Diagnosis not present

## 2021-08-07 DIAGNOSIS — N2581 Secondary hyperparathyroidism of renal origin: Secondary | ICD-10-CM | POA: Diagnosis not present

## 2021-08-07 DIAGNOSIS — D631 Anemia in chronic kidney disease: Secondary | ICD-10-CM | POA: Diagnosis not present

## 2021-08-07 DIAGNOSIS — E1122 Type 2 diabetes mellitus with diabetic chronic kidney disease: Secondary | ICD-10-CM | POA: Diagnosis not present

## 2021-08-07 DIAGNOSIS — I82402 Acute embolism and thrombosis of unspecified deep veins of left lower extremity: Secondary | ICD-10-CM | POA: Insufficient documentation

## 2021-08-07 DIAGNOSIS — I10 Essential (primary) hypertension: Secondary | ICD-10-CM | POA: Diagnosis not present

## 2021-08-07 DIAGNOSIS — N281 Cyst of kidney, acquired: Secondary | ICD-10-CM | POA: Insufficient documentation

## 2021-08-07 DIAGNOSIS — I509 Heart failure, unspecified: Secondary | ICD-10-CM | POA: Diagnosis not present

## 2021-08-07 DIAGNOSIS — N1832 Chronic kidney disease, stage 3b: Secondary | ICD-10-CM | POA: Diagnosis not present

## 2021-08-08 LAB — NM MYOCAR MULTI W/SPECT W/WALL MOTION / EF
LV dias vol: 65 mL (ref 62–150)
LV sys vol: 29 mL
Nuc Stress EF: 55 %
Peak HR: 105 {beats}/min
Percent HR: 70 %
Rest HR: 70 {beats}/min
Rest Nuclear Isotope Dose: 10.5 mCi
SDS: 1
SRS: 3
SSS: 1
ST Depression (mm): 0 mm
Stress Nuclear Isotope Dose: 29 mCi
TID: 0.82

## 2021-08-10 ENCOUNTER — Encounter: Payer: Self-pay | Admitting: Oncology

## 2021-08-10 DIAGNOSIS — D631 Anemia in chronic kidney disease: Secondary | ICD-10-CM | POA: Insufficient documentation

## 2021-08-14 DIAGNOSIS — E113513 Type 2 diabetes mellitus with proliferative diabetic retinopathy with macular edema, bilateral: Secondary | ICD-10-CM | POA: Diagnosis not present

## 2021-08-21 ENCOUNTER — Ambulatory Visit
Admission: RE | Admit: 2021-08-21 | Discharge: 2021-08-21 | Disposition: A | Discharge status: Home / Self Care | Payer: Medicare PPO | Source: Ambulatory Visit | Attending: Interventional Radiology | Admitting: Interventional Radiology

## 2021-08-21 ENCOUNTER — Encounter: Payer: Self-pay | Admitting: *Deleted

## 2021-08-21 DIAGNOSIS — N2889 Other specified disorders of kidney and ureter: Secondary | ICD-10-CM

## 2021-08-21 DIAGNOSIS — Z9889 Other specified postprocedural states: Secondary | ICD-10-CM | POA: Diagnosis not present

## 2021-08-21 HISTORY — PX: IR RADIOLOGIST EVAL & MGMT: IMG5224
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
## 2021-08-23 ENCOUNTER — Telehealth: Payer: Self-pay | Admitting: Internal Medicine

## 2021-08-23 NOTE — Telephone Encounter (Signed)
Spoke to pt and wife. They will look in to [facility] and see what they can do.

## 2021-08-23 NOTE — Telephone Encounter (Signed)
Elizabeth Coffin(Wife) called and said patients feet are swollen and his Kidney surgeon in [place] wanted to know if Dr Silvio Pate could write a script for support stockings and wanted him to be measured for them so he can get the right ones. Call back is [phone]

## 2021-08-28 ENCOUNTER — Telehealth: Payer: Self-pay | Admitting: Cardiovascular Disease

## 2021-08-28 NOTE — Telephone Encounter (Signed)
Spoke with the patients wife. Pt wife sts that the patients does has not reported sob or his weight has been stable.  Pts pcp Dr. Silvio Pate recommended daily compression stockings and leg elevation (pt has a hx of chronic venous insufficiency). Pt wife purchased over the counter compression stockings that the patient has been wearing daily along with daily leg elevation that have not been effective. The pt Nephrologist  recommended that she talk with Dr. Silvio Pate about getting the patient a prescription for fitted compression stockings. Pt wife has placed a call to Dr. Alla German office and is awaiting a call back.  Pt wife wonders if the pt can be fitted for compressions with out a prescription. Adv her that I would l think a Rx would be needed. Provide her with the telephone number for [facility] should she could inquire further.  Advised the patient that I agree the patient may need better fitting compression stockings. They should continue wearing the ones on hand and leg elevation while they await a call back for Dr. Silvio Pate.  Pt has no complaints of sob  and weights have been stable. LE swelling most likely related to chronic venous insufficieny. I will fwd the update to Dr. Fletcher Anon and call back if he has additional recommendations. Pt wife agreeable with the poc and voiced appreciation for the calk back.

## 2021-08-28 NOTE — Telephone Encounter (Signed)
Pt c/o swelling: STAT is pt has developed SOB within 24 hours  If swelling, where is the swelling located?   Both legs and feet.  Left leg has worse swelling  How much weight have you gained and in what time span?  None  Have you gained 3 pounds in a day or 5 pounds in a week?   No  Do you have a log of your daily weights (if so, list)?   Yes.  Wt maintaining at 252-254lbs  Are you currently taking a fluid pill?   Yes.  Are you currently SOB?   No  Have you traveled recently?   No    Wife stated patient's is BP 136/86  HR 96.  She stated they tried compression socks but the swelling is not going down.

## 2021-08-30 ENCOUNTER — Ambulatory Visit: Payer: Medicare PPO | Admitting: Internal Medicine

## 2021-08-30 ENCOUNTER — Encounter: Payer: Self-pay | Admitting: Internal Medicine

## 2021-08-30 VITALS — BP 130/80 | HR 75 | Ht 71.0 in | Wt 257.8 lb

## 2021-08-30 DIAGNOSIS — I872 Venous insufficiency (chronic) (peripheral): Secondary | ICD-10-CM

## 2021-08-30 DIAGNOSIS — I5032 Chronic diastolic (congestive) heart failure: Secondary | ICD-10-CM

## 2021-08-30 DIAGNOSIS — M79606 Pain in leg, unspecified: Secondary | ICD-10-CM | POA: Diagnosis not present

## 2021-08-30 MED ORDER — SPIRONOLACTONE 25 MG PO TABS: 12.5000 mg | ORAL_TABLET | Freq: Every day | ORAL | 3 refills | Status: DC

## 2021-08-30 MED ORDER — GABAPENTIN 100 MG PO CAPS: 100.0000 mg | ORAL_CAPSULE | Freq: Every day | ORAL | 3 refills | Status: DC

## 2021-08-30 NOTE — Patient Instructions (Signed)
Please add the spironolactone --- 1/2 tab (12.'5mg'$ ) daily to the furosemide '40mg'$ . Set up lab work in 1 week to check your kidneys and potassium.  Start the gabapentin at bedtime for the leg pain. Try 1 capsule daily for 2-3 nights---if it isn't effective, add another capsule ('200mg'$ ). After another 2-3 nights, if still awakening with pain, increase to 3 capsules ('300mg'$ ). If that isn't effective, send me an email--we can still increase from them.

## 2021-08-30 NOTE — Assessment & Plan Note (Signed)
This is mostly at night I believe this is diabetic neuropathy---not necessarily related to the edema Will start low dose gabapentin and titrate as needed

## 2021-08-30 NOTE — Assessment & Plan Note (Signed)
This does not appear exacerbated--but will add the spironolactone

## 2021-08-30 NOTE — Assessment & Plan Note (Signed)
Ongoing edema Will recommend support socks--- 20-70m Hg (rx given) Continue furosemide '40mg'$  daily Add spironolactone 12.'5mg'$  daily Check renal in 1 week or so
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
## 2021-09-13 ENCOUNTER — Other Ambulatory Visit (INDEPENDENT_AMBULATORY_CARE_PROVIDER_SITE_OTHER): Payer: Medicare PPO

## 2021-09-13 DIAGNOSIS — I872 Venous insufficiency (chronic) (peripheral): Secondary | ICD-10-CM

## 2021-09-13 LAB — RENAL FUNCTION PANEL
Albumin: 3.8 g/dL (ref 3.5–5.2)
BUN: 27 mg/dL — ABNORMAL HIGH (ref 6–23)
CO2: 24 mEq/L (ref 19–32)
Calcium: 9.3 mg/dL (ref 8.4–10.5)
Chloride: 106 mEq/L (ref 96–112)
Creatinine, Ser: 1.84 mg/dL — ABNORMAL HIGH (ref 0.40–1.50)
GFR: 36.13 mL/min — ABNORMAL LOW (ref >60.00)
Glucose, Bld: 135 mg/dL — ABNORMAL HIGH (ref 70–99)
Phosphorus: 3.3 mg/dL (ref 2.3–4.6)
Potassium: 3.7 mEq/L (ref 3.5–5.1)
Sodium: 140 mEq/L (ref 135–145)

## 2021-09-16 DIAGNOSIS — Z833 Family history of diabetes mellitus: Secondary | ICD-10-CM | POA: Diagnosis not present

## 2021-09-16 DIAGNOSIS — E1122 Type 2 diabetes mellitus with diabetic chronic kidney disease: Secondary | ICD-10-CM | POA: Diagnosis not present

## 2021-09-16 DIAGNOSIS — E1151 Type 2 diabetes mellitus with diabetic peripheral angiopathy without gangrene: Secondary | ICD-10-CM | POA: Diagnosis not present

## 2021-09-16 DIAGNOSIS — I509 Heart failure, unspecified: Secondary | ICD-10-CM | POA: Diagnosis not present

## 2021-09-16 DIAGNOSIS — Z809 Family history of malignant neoplasm, unspecified: Secondary | ICD-10-CM | POA: Diagnosis not present

## 2021-09-16 DIAGNOSIS — Z85528 Personal history of other malignant neoplasm of kidney: Secondary | ICD-10-CM | POA: Diagnosis not present

## 2021-09-16 DIAGNOSIS — Z794 Long term (current) use of insulin: Secondary | ICD-10-CM | POA: Diagnosis not present

## 2021-09-16 DIAGNOSIS — E261 Secondary hyperaldosteronism: Secondary | ICD-10-CM | POA: Diagnosis not present

## 2021-09-16 DIAGNOSIS — Z8249 Family history of ischemic heart disease and other diseases of the circulatory system: Secondary | ICD-10-CM | POA: Diagnosis not present

## 2021-09-16 DIAGNOSIS — Z881 Allergy status to other antibiotic agents status: Secondary | ICD-10-CM | POA: Diagnosis not present

## 2021-09-16 DIAGNOSIS — Z87891 Personal history of nicotine dependence: Secondary | ICD-10-CM | POA: Diagnosis not present

## 2021-09-16 DIAGNOSIS — N184 Chronic kidney disease, stage 4 (severe): Secondary | ICD-10-CM | POA: Diagnosis not present

## 2021-09-16 DIAGNOSIS — I110 Hypertensive heart disease with heart failure: Secondary | ICD-10-CM | POA: Diagnosis not present

## 2021-09-16 DIAGNOSIS — E213 Hyperparathyroidism, unspecified: Secondary | ICD-10-CM | POA: Diagnosis not present

## 2021-09-18 DIAGNOSIS — E113513 Type 2 diabetes mellitus with proliferative diabetic retinopathy with macular edema, bilateral: Secondary | ICD-10-CM | POA: Diagnosis not present

## 2021-09-18 DIAGNOSIS — H35359 Cystoid macular degeneration, unspecified eye: Secondary | ICD-10-CM | POA: Diagnosis not present

## 2021-09-18 LAB — HM DIABETES EYE EXAM

## 2021-09-22 DIAGNOSIS — M19072 Primary osteoarthritis, left ankle and foot: Secondary | ICD-10-CM | POA: Diagnosis not present

## 2021-09-23 ENCOUNTER — Telehealth: Payer: Self-pay | Admitting: Cardiovascular Disease

## 2021-09-23 NOTE — Telephone Encounter (Signed)
Yes, should be ok to take

## 2021-09-23 NOTE — Telephone Encounter (Signed)
Will fwd to [facility] to advise.

## 2021-09-23 NOTE — Telephone Encounter (Signed)
Pt c/o medication issue:  1. Name of Medication: Prednisone (prescribe to patient on yesterday)  2. How are you currently taking this medication (dosage and times per day)?    3. Are you having a reaction (difficulty breathing--STAT)? no  4. What is your medication issue? Calling to see if it okay for patient to continue to take this medication that was prescribe to the patient

## 2021-09-23 NOTE — Telephone Encounter (Signed)
Pt wife made aware of PharmDs response with verbalized understanding. Pt and his wife voiced appreciation for the call.

## 2021-09-24 ENCOUNTER — Encounter: Payer: Self-pay | Admitting: Internal Medicine

## 2021-09-24 ENCOUNTER — Ambulatory Visit: Payer: Medicare PPO | Admitting: Internal Medicine

## 2021-09-24 DIAGNOSIS — M1000 Idiopathic gout, unspecified site: Secondary | ICD-10-CM

## 2021-09-24 DIAGNOSIS — I5032 Chronic diastolic (congestive) heart failure: Secondary | ICD-10-CM

## 2021-09-24 DIAGNOSIS — I82401 Acute embolism and thrombosis of unspecified deep veins of right lower extremity: Secondary | ICD-10-CM

## 2021-09-24 DIAGNOSIS — E1142 Type 2 diabetes mellitus with diabetic polyneuropathy: Secondary | ICD-10-CM

## 2021-09-24 DIAGNOSIS — M109 Gout, unspecified: Secondary | ICD-10-CM | POA: Insufficient documentation

## 2021-09-24 NOTE — Assessment & Plan Note (Addendum)
Sugars up with the steroids Continues on lantus 40, jardiance 10, trulicity '3mg'$  weekly Will plan to recheck in 3 months Sleeping better with gabapentin --now up to '200mg'$  at bedtime
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
## 2021-09-24 NOTE — Assessment & Plan Note (Signed)
Some increased edema now (steroids) Furosemide 40 daily

## 2021-09-24 NOTE — Assessment & Plan Note (Signed)
Continues on eliquis '5mg'$  bid Likely will need ongoing Rx---will be going back to Dr Tasia Catchings

## 2021-09-24 NOTE — Assessment & Plan Note (Signed)
New diagnosis and improved with the methylprednisolone Will presume that relatively new spironolactone was the cause and stop this

## 2021-09-27 ENCOUNTER — Inpatient Hospital Stay: Payer: Medicare PPO | Attending: Oncology

## 2021-09-27 DIAGNOSIS — Z7901 Long term (current) use of anticoagulants: Secondary | ICD-10-CM | POA: Insufficient documentation

## 2021-09-27 DIAGNOSIS — K219 Gastro-esophageal reflux disease without esophagitis: Secondary | ICD-10-CM | POA: Diagnosis not present

## 2021-09-27 DIAGNOSIS — K862 Cyst of pancreas: Secondary | ICD-10-CM | POA: Insufficient documentation

## 2021-09-27 DIAGNOSIS — Z886 Allergy status to analgesic agent status: Secondary | ICD-10-CM | POA: Insufficient documentation

## 2021-09-27 DIAGNOSIS — M7989 Other specified soft tissue disorders: Secondary | ICD-10-CM | POA: Insufficient documentation

## 2021-09-27 DIAGNOSIS — I872 Venous insufficiency (chronic) (peripheral): Secondary | ICD-10-CM | POA: Insufficient documentation

## 2021-09-27 DIAGNOSIS — M25473 Effusion, unspecified ankle: Secondary | ICD-10-CM | POA: Diagnosis not present

## 2021-09-27 DIAGNOSIS — Z881 Allergy status to other antibiotic agents status: Secondary | ICD-10-CM | POA: Insufficient documentation

## 2021-09-27 DIAGNOSIS — C641 Malignant neoplasm of right kidney, except renal pelvis: Secondary | ICD-10-CM | POA: Diagnosis not present

## 2021-09-27 DIAGNOSIS — Z87891 Personal history of nicotine dependence: Secondary | ICD-10-CM | POA: Insufficient documentation

## 2021-09-27 DIAGNOSIS — Z8249 Family history of ischemic heart disease and other diseases of the circulatory system: Secondary | ICD-10-CM | POA: Insufficient documentation

## 2021-09-27 DIAGNOSIS — Z8719 Personal history of other diseases of the digestive system: Secondary | ICD-10-CM | POA: Diagnosis not present

## 2021-09-27 DIAGNOSIS — E1122 Type 2 diabetes mellitus with diabetic chronic kidney disease: Secondary | ICD-10-CM | POA: Insufficient documentation

## 2021-09-27 DIAGNOSIS — Z818 Family history of other mental and behavioral disorders: Secondary | ICD-10-CM | POA: Diagnosis not present

## 2021-09-27 DIAGNOSIS — I82412 Acute embolism and thrombosis of left femoral vein: Secondary | ICD-10-CM | POA: Diagnosis not present

## 2021-09-27 DIAGNOSIS — Z833 Family history of diabetes mellitus: Secondary | ICD-10-CM | POA: Diagnosis not present

## 2021-09-27 DIAGNOSIS — Z8379 Family history of other diseases of the digestive system: Secondary | ICD-10-CM | POA: Diagnosis not present

## 2021-09-27 DIAGNOSIS — N1832 Chronic kidney disease, stage 3b: Secondary | ICD-10-CM | POA: Diagnosis not present

## 2021-09-27 DIAGNOSIS — Z79899 Other long term (current) drug therapy: Secondary | ICD-10-CM | POA: Insufficient documentation

## 2021-09-27 DIAGNOSIS — I129 Hypertensive chronic kidney disease with stage 1 through stage 4 chronic kidney disease, or unspecified chronic kidney disease: Secondary | ICD-10-CM | POA: Insufficient documentation

## 2021-09-27 LAB — COMPREHENSIVE METABOLIC PANEL
ALT: 14 U/L (ref 0–44)
AST: 15 U/L (ref 15–41)
Albumin: 3.8 g/dL (ref 3.5–5.0)
Alkaline Phosphatase: 81 U/L (ref 38–126)
Anion gap: 9 (ref 5–15)
BUN: 41 mg/dL — ABNORMAL HIGH (ref 8–23)
CO2: 25 mmol/L (ref 22–32)
Calcium: 9.8 mg/dL (ref 8.9–10.3)
Chloride: 101 mmol/L (ref 98–111)
Creatinine, Ser: 1.67 mg/dL — ABNORMAL HIGH (ref 0.61–1.24)
GFR, Estimated: 43 mL/min — ABNORMAL LOW (ref >60)
Glucose, Bld: 317 mg/dL — ABNORMAL HIGH (ref 70–99)
Potassium: 3.9 mmol/L (ref 3.5–5.1)
Sodium: 135 mmol/L (ref 135–145)
Total Bilirubin: 0.4 mg/dL (ref 0.3–1.2)
Total Protein: 8.5 g/dL — ABNORMAL HIGH (ref 6.5–8.1)

## 2021-09-27 LAB — CBC WITH DIFFERENTIAL/PLATELET
Abs Immature Granulocytes: 0.03 10*3/uL (ref 0.00–0.07)
Basophils Absolute: 0 10*3/uL (ref 0.0–0.1)
Basophils Relative: 0 %
Eosinophils Absolute: 0 10*3/uL (ref 0.0–0.5)
Eosinophils Relative: 0 %
HCT: 36.1 % — ABNORMAL LOW (ref 39.0–52.0)
Hemoglobin: 11.6 g/dL — ABNORMAL LOW (ref 13.0–17.0)
Immature Granulocytes: 0 %
Lymphocytes Relative: 33 %
Lymphs Abs: 4.1 10*3/uL — ABNORMAL HIGH (ref 0.7–4.0)
MCH: 28.3 pg (ref 26.0–34.0)
MCHC: 32.1 g/dL (ref 30.0–36.0)
MCV: 88 fL (ref 80.0–100.0)
Monocytes Absolute: 0.8 10*3/uL (ref 0.1–1.0)
Monocytes Relative: 6 %
Neutro Abs: 7.3 10*3/uL (ref 1.7–7.7)
Neutrophils Relative %: 61 %
Platelets: 252 10*3/uL (ref 150–400)
RBC: 4.1 MIL/uL — ABNORMAL LOW (ref 4.22–5.81)
RDW: 14.8 % (ref 11.5–15.5)
WBC: 12.2 10*3/uL — ABNORMAL HIGH (ref 4.0–10.5)
nRBC: 0 % (ref 0.0–0.2)

## 2021-10-02 ENCOUNTER — Telehealth: Payer: Self-pay | Admitting: Internal Medicine

## 2021-10-02 DIAGNOSIS — E1122 Type 2 diabetes mellitus with diabetic chronic kidney disease: Secondary | ICD-10-CM | POA: Diagnosis not present

## 2021-10-02 DIAGNOSIS — I509 Heart failure, unspecified: Secondary | ICD-10-CM | POA: Diagnosis not present

## 2021-10-02 DIAGNOSIS — N1832 Chronic kidney disease, stage 3b: Secondary | ICD-10-CM | POA: Diagnosis not present

## 2021-10-02 DIAGNOSIS — N281 Cyst of kidney, acquired: Secondary | ICD-10-CM | POA: Diagnosis not present

## 2021-10-02 DIAGNOSIS — D631 Anemia in chronic kidney disease: Secondary | ICD-10-CM | POA: Diagnosis not present

## 2021-10-02 DIAGNOSIS — I82402 Acute embolism and thrombosis of unspecified deep veins of left lower extremity: Secondary | ICD-10-CM | POA: Diagnosis not present

## 2021-10-02 DIAGNOSIS — I10 Essential (primary) hypertension: Secondary | ICD-10-CM | POA: Diagnosis not present

## 2021-10-02 DIAGNOSIS — N2581 Secondary hyperparathyroidism of renal origin: Secondary | ICD-10-CM | POA: Diagnosis not present

## 2021-10-02 DIAGNOSIS — R809 Proteinuria, unspecified: Secondary | ICD-10-CM | POA: Diagnosis not present

## 2021-10-02 NOTE — Telephone Encounter (Signed)
Type of forms received: [place]--Patient assistance  Routed to: Provider's inbox upfront  Paperwork received by : AY   Please call the patient at [phone] with any questions or updates for the patient

## 2021-10-03 LAB — FACTOR 5 LEIDEN

## 2021-10-03 LAB — PROTHROMBIN GENE MUTATION

## 2021-10-04 ENCOUNTER — Encounter: Payer: Self-pay | Admitting: Oncology

## 2021-10-04 ENCOUNTER — Inpatient Hospital Stay (HOSPITAL_BASED_OUTPATIENT_CLINIC_OR_DEPARTMENT_OTHER): Payer: Medicare PPO | Admitting: Oncology

## 2021-10-04 VITALS — BP 129/82 | HR 87 | Temp 99.1°F | Ht 71.0 in | Wt 258.0 lb

## 2021-10-04 DIAGNOSIS — C641 Malignant neoplasm of right kidney, except renal pelvis: Secondary | ICD-10-CM | POA: Diagnosis not present

## 2021-10-04 DIAGNOSIS — I82401 Acute embolism and thrombosis of unspecified deep veins of right lower extremity: Secondary | ICD-10-CM | POA: Diagnosis not present

## 2021-10-04 DIAGNOSIS — I82412 Acute embolism and thrombosis of left femoral vein: Secondary | ICD-10-CM | POA: Diagnosis not present

## 2021-10-04 DIAGNOSIS — K219 Gastro-esophageal reflux disease without esophagitis: Secondary | ICD-10-CM | POA: Diagnosis not present

## 2021-10-04 DIAGNOSIS — N1832 Chronic kidney disease, stage 3b: Secondary | ICD-10-CM | POA: Diagnosis not present

## 2021-10-04 DIAGNOSIS — K862 Cyst of pancreas: Secondary | ICD-10-CM

## 2021-10-04 DIAGNOSIS — I872 Venous insufficiency (chronic) (peripheral): Secondary | ICD-10-CM | POA: Diagnosis not present

## 2021-10-04 DIAGNOSIS — M25473 Effusion, unspecified ankle: Secondary | ICD-10-CM | POA: Diagnosis not present

## 2021-10-04 DIAGNOSIS — M7989 Other specified soft tissue disorders: Secondary | ICD-10-CM | POA: Diagnosis not present

## 2021-10-04 DIAGNOSIS — I129 Hypertensive chronic kidney disease with stage 1 through stage 4 chronic kidney disease, or unspecified chronic kidney disease: Secondary | ICD-10-CM | POA: Diagnosis not present

## 2021-10-04 DIAGNOSIS — E1122 Type 2 diabetes mellitus with diabetic chronic kidney disease: Secondary | ICD-10-CM | POA: Diagnosis not present

## 2021-10-04 NOTE — Telephone Encounter (Signed)
Here is the info about the form. Per Dr Silvio Pate, the form was placed on his desk with no pt info on it.

## 2021-10-04 NOTE — Telephone Encounter (Signed)
I understand now. There were 2 applications: [employer] Harps  for [employer] and [employer] for [employer]. I have placed back in Dr Alla German inbox on his desk to sign for the [employer].

## 2021-10-05 DIAGNOSIS — K862 Cyst of pancreas: Secondary | ICD-10-CM | POA: Insufficient documentation

## 2021-10-05 DIAGNOSIS — C641 Malignant neoplasm of right kidney, except renal pelvis: Secondary | ICD-10-CM | POA: Insufficient documentation
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
## 2021-10-07 ENCOUNTER — Other Ambulatory Visit: Payer: Self-pay | Admitting: Internal Medicine

## 2021-10-08 NOTE — Telephone Encounter (Signed)
Patient Adam [name] called back in to speak with [facility] regarding additional information.

## 2021-10-08 NOTE — Telephone Encounter (Signed)
Patient wife [name] called in to follow up to see if these have been signed and faxed over to the company. Fax number is on the form. Thank you!

## 2021-10-08 NOTE — Telephone Encounter (Signed)
Spoke to pt's wife. She said [employer] advised her that they will provide the patient assistance program with his costs. I will fax the forms as soon as they are signed.

## 2021-10-08 NOTE — Telephone Encounter (Signed)
Spoke to pt's wife. Advised her that we are needing the income information to not delay the applications. She is going to work on getting everything together and get it to me. I am sending the forms back to Dr Silvio Pate as 1 signature was missed.

## 2021-10-09 DIAGNOSIS — D631 Anemia in chronic kidney disease: Secondary | ICD-10-CM | POA: Diagnosis not present

## 2021-10-09 DIAGNOSIS — R809 Proteinuria, unspecified: Secondary | ICD-10-CM | POA: Diagnosis not present

## 2021-10-09 DIAGNOSIS — N2581 Secondary hyperparathyroidism of renal origin: Secondary | ICD-10-CM | POA: Diagnosis not present

## 2021-10-09 DIAGNOSIS — N281 Cyst of kidney, acquired: Secondary | ICD-10-CM | POA: Diagnosis not present

## 2021-10-09 DIAGNOSIS — N1832 Chronic kidney disease, stage 3b: Secondary | ICD-10-CM | POA: Diagnosis not present

## 2021-10-09 DIAGNOSIS — I82402 Acute embolism and thrombosis of unspecified deep veins of left lower extremity: Secondary | ICD-10-CM | POA: Diagnosis not present

## 2021-10-09 DIAGNOSIS — E1122 Type 2 diabetes mellitus with diabetic chronic kidney disease: Secondary | ICD-10-CM | POA: Diagnosis not present

## 2021-10-09 DIAGNOSIS — I509 Heart failure, unspecified: Secondary | ICD-10-CM | POA: Diagnosis not present

## 2021-10-09 DIAGNOSIS — I10 Essential (primary) hypertension: Secondary | ICD-10-CM | POA: Diagnosis not present

## 2021-10-14 ENCOUNTER — Ambulatory Visit
Admission: RE | Admit: 2021-10-14 | Discharge: 2021-10-14 | Disposition: A | Discharge status: Home / Self Care | Payer: Medicare PPO | Source: Ambulatory Visit | Attending: Oncology | Admitting: Oncology

## 2021-10-14 DIAGNOSIS — I82401 Acute embolism and thrombosis of unspecified deep veins of right lower extremity: Secondary | ICD-10-CM | POA: Diagnosis not present

## 2021-10-14 DIAGNOSIS — I82439 Acute embolism and thrombosis of unspecified popliteal vein: Secondary | ICD-10-CM | POA: Diagnosis not present

## 2021-10-21 ENCOUNTER — Telehealth: Payer: Self-pay | Admitting: Internal Medicine

## 2021-10-21 NOTE — Telephone Encounter (Signed)
Marquita from [employer] Patient Assistance foundation called over regarding the patient application. She stated they received the application but are missing the number of people in household for the application. She stated she can be reached at [phone]. Thank you!

## 2021-10-23 NOTE — Telephone Encounter (Signed)
Application received today that he was approved.

## 2021-10-25 ENCOUNTER — Ambulatory Visit: Payer: Medicare PPO | Admitting: Physician Assistant

## 2021-11-08 ENCOUNTER — Ambulatory Visit: Payer: Medicare PPO | Attending: Cardiovascular Disease | Admitting: Cardiovascular Disease

## 2021-11-08 ENCOUNTER — Encounter: Payer: Self-pay | Admitting: Cardiovascular Disease

## 2021-11-08 VITALS — BP 140/80 | HR 84 | Ht 72.0 in | Wt 255.1 lb

## 2021-11-08 DIAGNOSIS — I5032 Chronic diastolic (congestive) heart failure: Secondary | ICD-10-CM

## 2021-11-08 DIAGNOSIS — I77810 Thoracic aortic ectasia: Secondary | ICD-10-CM

## 2021-11-08 DIAGNOSIS — E785 Hyperlipidemia, unspecified: Secondary | ICD-10-CM

## 2021-11-08 DIAGNOSIS — Z86718 Personal history of other venous thrombosis and embolism: Secondary | ICD-10-CM
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
## 2021-11-08 NOTE — Patient Instructions (Signed)
Medication Instructions:  Your physician recommends that you continue on your current medications as directed. Please refer to the Current Medication list given to you today.  *If you need a refill on your cardiac medications before your next appointment, please call your pharmacy*   Lab Work: None ordered  If you have labs (blood work) drawn today and your tests are completely normal, you will receive your results only by: MyChart Message (if you have MyChart) OR A paper copy in the mail If you have any lab test that is abnormal or we need to change your treatment, we will call you to review the results.   Testing/Procedures: None ordered  Follow-Up: At [facility], you and your health needs are our priority.  As part of our continuing mission to provide you with exceptional heart care, we have created designated Provider Care Teams.  These Care Teams include your primary Cardiologist (physician) and Advanced Practice Providers (APPs -  Physician Assistants and Nurse Practitioners) who all work together to provide you with the care you need, when you need it.  We recommend signing up for the patient portal called "MyChart".  Sign up information is provided on this After Visit Summary.  MyChart is used to connect with patients for Virtual Visits (Telemedicine).  Patients are able to view lab/test results, encounter notes, upcoming appointments, etc.  Non-urgent messages can be sent to your provider as well.   To learn more about what you can do with MyChart, go to [email or web address].    Your next appointment:   6 month(s)  The format for your next appointment:   In Person  Provider:   You may see Dr. Muhammad Arida or one of the following Advanced Practice Providers on your designated Care Team:   Christopher Berge, NP Ryan Dunn, PA-C Cadence Furth, PA-C Sheri Hammock, NP   Important Information About Sugar       

## 2021-11-20 DIAGNOSIS — E113513 Type 2 diabetes mellitus with proliferative diabetic retinopathy with macular edema, bilateral: Secondary | ICD-10-CM | POA: Diagnosis not present

## 2021-11-20 DIAGNOSIS — H35359 Cystoid macular degeneration, unspecified eye: Secondary | ICD-10-CM | POA: Diagnosis not present

## 2021-11-23 DIAGNOSIS — M654 Radial styloid tenosynovitis [de Quervain]: Secondary | ICD-10-CM | POA: Diagnosis not present

## 2021-11-27 ENCOUNTER — Other Ambulatory Visit: Payer: Self-pay | Admitting: Internal Medicine

## 2021-11-28 ENCOUNTER — Other Ambulatory Visit: Payer: Self-pay | Admitting: Student

## 2021-11-28 DIAGNOSIS — H905 Unspecified sensorineural hearing loss: Secondary | ICD-10-CM

## 2021-11-28 DIAGNOSIS — H90A21 Sensorineural hearing loss, unilateral, right ear, with restricted hearing on the contralateral side: Secondary | ICD-10-CM | POA: Diagnosis not present

## 2021-11-28 DIAGNOSIS — H9311 Tinnitus, right ear: Secondary | ICD-10-CM | POA: Diagnosis not present

## 2021-12-10 DIAGNOSIS — M654 Radial styloid tenosynovitis [de Quervain]: Secondary | ICD-10-CM | POA: Diagnosis not present

## 2021-12-24 ENCOUNTER — Other Ambulatory Visit: Payer: Self-pay | Admitting: Student

## 2021-12-24 ENCOUNTER — Ambulatory Visit
Admission: RE | Admit: 2021-12-24 | Discharge: 2021-12-24 | Disposition: A | Discharge status: Home / Self Care | Payer: Medicare PPO | Source: Ambulatory Visit | Attending: Student | Admitting: Student

## 2021-12-24 DIAGNOSIS — G9389 Other specified disorders of brain: Secondary | ICD-10-CM | POA: Diagnosis not present

## 2021-12-24 DIAGNOSIS — H9191 Unspecified hearing loss, right ear: Secondary | ICD-10-CM | POA: Diagnosis not present

## 2021-12-24 DIAGNOSIS — H905 Unspecified sensorineural hearing loss: Secondary | ICD-10-CM

## 2021-12-24 MED ORDER — GADOBENATE DIMEGLUMINE 529 MG/ML IV SOLN
20.0000 mL | Freq: Once | INTRAVENOUS | Status: AC | PRN
  Administered 2021-12-24: 20 mL via INTRAVENOUS

## 2021-12-25 ENCOUNTER — Ambulatory Visit: Payer: Medicare PPO | Admitting: Internal Medicine

## 2021-12-25 ENCOUNTER — Encounter: Payer: Self-pay | Admitting: Internal Medicine

## 2021-12-25 VITALS — BP 158/90 | HR 78 | Temp 97.0°F | Ht 72.0 in | Wt 251.1 lb

## 2021-12-25 DIAGNOSIS — Z794 Long term (current) use of insulin: Secondary | ICD-10-CM

## 2021-12-25 DIAGNOSIS — E1142 Type 2 diabetes mellitus with diabetic polyneuropathy: Secondary | ICD-10-CM | POA: Diagnosis not present

## 2021-12-25 DIAGNOSIS — E113411 Type 2 diabetes mellitus with severe nonproliferative diabetic retinopathy with macular edema, right eye: Secondary | ICD-10-CM | POA: Diagnosis not present

## 2021-12-25 DIAGNOSIS — I82401 Acute embolism and thrombosis of unspecified deep veins of right lower extremity: Secondary | ICD-10-CM | POA: Diagnosis not present

## 2021-12-25 DIAGNOSIS — I5032 Chronic diastolic (congestive) heart failure: Secondary | ICD-10-CM

## 2021-12-25 DIAGNOSIS — N1832 Chronic kidney disease, stage 3b: Secondary | ICD-10-CM | POA: Diagnosis not present

## 2021-12-25 DIAGNOSIS — Z23 Encounter for immunization: Secondary | ICD-10-CM

## 2021-12-25 LAB — POCT GLYCOSYLATED HEMOGLOBIN (HGB A1C): Hemoglobin A1C: 11.9 % — AB (ref 4.0–5.6)

## 2021-12-25 MED ORDER — LANTUS SOLOSTAR 100 UNIT/ML ~~LOC~~ SOPN
50.0000 [IU] | PEN_INJECTOR | Freq: Every day | SUBCUTANEOUS | 0 refills | Status: DC
Start: 2021-12-25 — End: 2022-07-14

## 2021-12-25 NOTE — Assessment & Plan Note (Signed)
Unprovoked Will be going back to Dr Tasia Catchings soon---still on the eliquis 5 bid and may need long term

## 2021-12-25 NOTE — Assessment & Plan Note (Signed)
Lab Results  Component Value Date   HGBA1C 11.9 (A) 12/25/2021   Control has really slipped---partially from the prednisone need Will increase lantus to 50 Continue jardiance 10 and trulicity '3mg'$  weekly Recheck 3 months
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
## 2021-12-25 NOTE — Assessment & Plan Note (Signed)
Compensated with furosemide 40 If BP stays up---should go on ACEI/ARB

## 2021-12-25 NOTE — Assessment & Plan Note (Signed)
Might want to consider increasing jardiance

## 2022-01-06 ENCOUNTER — Inpatient Hospital Stay (HOSPITAL_BASED_OUTPATIENT_CLINIC_OR_DEPARTMENT_OTHER): Payer: Medicare PPO | Admitting: Oncology

## 2022-01-06 ENCOUNTER — Encounter: Payer: Self-pay | Admitting: Oncology

## 2022-01-06 ENCOUNTER — Inpatient Hospital Stay: Payer: Medicare PPO | Attending: Oncology

## 2022-01-06 VITALS — BP 170/89 | HR 80 | Temp 96.8°F | Wt 254.7 lb

## 2022-01-06 DIAGNOSIS — Z85528 Personal history of other malignant neoplasm of kidney: Secondary | ICD-10-CM | POA: Insufficient documentation

## 2022-01-06 DIAGNOSIS — C641 Malignant neoplasm of right kidney, except renal pelvis: Secondary | ICD-10-CM

## 2022-01-06 DIAGNOSIS — M7989 Other specified soft tissue disorders: Secondary | ICD-10-CM | POA: Insufficient documentation

## 2022-01-06 DIAGNOSIS — N1832 Chronic kidney disease, stage 3b: Secondary | ICD-10-CM

## 2022-01-06 DIAGNOSIS — I129 Hypertensive chronic kidney disease with stage 1 through stage 4 chronic kidney disease, or unspecified chronic kidney disease: Secondary | ICD-10-CM | POA: Insufficient documentation

## 2022-01-06 DIAGNOSIS — Z818 Family history of other mental and behavioral disorders: Secondary | ICD-10-CM | POA: Insufficient documentation

## 2022-01-06 DIAGNOSIS — Z8249 Family history of ischemic heart disease and other diseases of the circulatory system: Secondary | ICD-10-CM | POA: Insufficient documentation

## 2022-01-06 DIAGNOSIS — I82401 Acute embolism and thrombosis of unspecified deep veins of right lower extremity: Secondary | ICD-10-CM | POA: Diagnosis not present

## 2022-01-06 DIAGNOSIS — I82412 Acute embolism and thrombosis of left femoral vein: Secondary | ICD-10-CM | POA: Diagnosis not present

## 2022-01-06 DIAGNOSIS — R42 Dizziness and giddiness: Secondary | ICD-10-CM | POA: Insufficient documentation

## 2022-01-06 DIAGNOSIS — Z886 Allergy status to analgesic agent status: Secondary | ICD-10-CM | POA: Diagnosis not present

## 2022-01-06 DIAGNOSIS — I959 Hypotension, unspecified: Secondary | ICD-10-CM | POA: Diagnosis not present

## 2022-01-06 DIAGNOSIS — R600 Localized edema: Secondary | ICD-10-CM | POA: Diagnosis not present

## 2022-01-06 DIAGNOSIS — Z881 Allergy status to other antibiotic agents status: Secondary | ICD-10-CM | POA: Diagnosis not present

## 2022-01-06 DIAGNOSIS — R0602 Shortness of breath: Secondary | ICD-10-CM | POA: Diagnosis not present

## 2022-01-06 DIAGNOSIS — Z87891 Personal history of nicotine dependence: Secondary | ICD-10-CM | POA: Diagnosis not present

## 2022-01-06 DIAGNOSIS — Z833 Family history of diabetes mellitus: Secondary | ICD-10-CM | POA: Diagnosis not present

## 2022-01-06 DIAGNOSIS — D631 Anemia in chronic kidney disease: Secondary | ICD-10-CM | POA: Diagnosis not present

## 2022-01-06 DIAGNOSIS — N189 Chronic kidney disease, unspecified: Secondary | ICD-10-CM

## 2022-01-06 DIAGNOSIS — K862 Cyst of pancreas: Secondary | ICD-10-CM | POA: Insufficient documentation

## 2022-01-06 DIAGNOSIS — E1122 Type 2 diabetes mellitus with diabetic chronic kidney disease: Secondary | ICD-10-CM | POA: Insufficient documentation

## 2022-01-06 DIAGNOSIS — Z8379 Family history of other diseases of the digestive system: Secondary | ICD-10-CM | POA: Insufficient documentation

## 2022-01-06 DIAGNOSIS — Z8719 Personal history of other diseases of the digestive system: Secondary | ICD-10-CM | POA: Insufficient documentation

## 2022-01-06 DIAGNOSIS — Z79899 Other long term (current) drug therapy: Secondary | ICD-10-CM | POA: Insufficient documentation

## 2022-01-06 DIAGNOSIS — Z7901 Long term (current) use of anticoagulants: Secondary | ICD-10-CM | POA: Insufficient documentation

## 2022-01-06 LAB — CBC WITH DIFFERENTIAL/PLATELET
Abs Immature Granulocytes: 0.01 10*3/uL (ref 0.00–0.07)
Basophils Absolute: 0.1 10*3/uL (ref 0.0–0.1)
Basophils Relative: 1 %
Eosinophils Absolute: 0.3 10*3/uL (ref 0.0–0.5)
Eosinophils Relative: 5 %
HCT: 35.5 % — ABNORMAL LOW (ref 39.0–52.0)
Hemoglobin: 11 g/dL — ABNORMAL LOW (ref 13.0–17.0)
Immature Granulocytes: 0 %
Lymphocytes Relative: 54 %
Lymphs Abs: 3.1 10*3/uL (ref 0.7–4.0)
MCH: 26.9 pg (ref 26.0–34.0)
MCHC: 31 g/dL (ref 30.0–36.0)
MCV: 86.8 fL (ref 80.0–100.0)
Monocytes Absolute: 0.5 10*3/uL (ref 0.1–1.0)
Monocytes Relative: 9 %
Neutro Abs: 1.8 10*3/uL (ref 1.7–7.7)
Neutrophils Relative %: 31 %
Platelets: 219 10*3/uL (ref 150–400)
RBC: 4.09 MIL/uL — ABNORMAL LOW (ref 4.22–5.81)
RDW: 16.7 % — ABNORMAL HIGH (ref 11.5–15.5)
WBC: 5.8 10*3/uL (ref 4.0–10.5)
nRBC: 0 % (ref 0.0–0.2)

## 2022-01-06 LAB — COMPREHENSIVE METABOLIC PANEL
ALT: 11 U/L (ref 0–44)
AST: 16 U/L (ref 15–41)
Albumin: 3.2 g/dL — ABNORMAL LOW (ref 3.5–5.0)
Alkaline Phosphatase: 68 U/L (ref 38–126)
Anion gap: 6 (ref 5–15)
BUN: 22 mg/dL (ref 8–23)
CO2: 26 mmol/L (ref 22–32)
Calcium: 8.7 mg/dL — ABNORMAL LOW (ref 8.9–10.3)
Chloride: 106 mmol/L (ref 98–111)
Creatinine, Ser: 1.48 mg/dL — ABNORMAL HIGH (ref 0.61–1.24)
GFR, Estimated: 50 mL/min — ABNORMAL LOW (ref >60)
Glucose, Bld: 129 mg/dL — ABNORMAL HIGH (ref 70–99)
Potassium: 3.7 mmol/L (ref 3.5–5.1)
Sodium: 138 mmol/L (ref 135–145)
Total Bilirubin: 0.4 mg/dL (ref 0.3–1.2)
Total Protein: 7.5 g/dL (ref 6.5–8.1)

## 2022-01-06 LAB — LACTATE DEHYDROGENASE: LDH: 162 U/L (ref 98–192)

## 2022-01-06 NOTE — Assessment & Plan Note (Addendum)
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  Repeat MRI abdomen w wo in June 2024.

## 2022-01-06 NOTE — Assessment & Plan Note (Signed)
Stable Cr.  Avoid nephrotoxins and encourage oral hydration.

## 2022-01-06 NOTE — Assessment & Plan Note (Addendum)
#  unprovoked acute lower extremity DVT.  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  Continue Eliquis '5mg'$  BID for now.  Awaiting APS panel.  We discussed about decrease Eliquis 2.'5mg'$  BID for long term prophylaxis. Will update him once his lab is back.   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

## 2022-01-06 NOTE — Assessment & Plan Note (Signed)
Observation  
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
## 2022-01-06 NOTE — Assessment & Plan Note (Signed)
Hb is stale. Observation.

## 2022-01-08 DIAGNOSIS — N1832 Chronic kidney disease, stage 3b: Secondary | ICD-10-CM | POA: Diagnosis not present

## 2022-01-08 DIAGNOSIS — I509 Heart failure, unspecified: Secondary | ICD-10-CM | POA: Diagnosis not present

## 2022-01-08 DIAGNOSIS — D631 Anemia in chronic kidney disease: Secondary | ICD-10-CM | POA: Diagnosis not present

## 2022-01-08 DIAGNOSIS — N2581 Secondary hyperparathyroidism of renal origin: Secondary | ICD-10-CM | POA: Diagnosis not present

## 2022-01-08 DIAGNOSIS — N281 Cyst of kidney, acquired: Secondary | ICD-10-CM | POA: Diagnosis not present

## 2022-01-08 DIAGNOSIS — E1122 Type 2 diabetes mellitus with diabetic chronic kidney disease: Secondary | ICD-10-CM | POA: Diagnosis not present

## 2022-01-08 DIAGNOSIS — I10 Essential (primary) hypertension: Secondary | ICD-10-CM | POA: Diagnosis not present

## 2022-01-08 DIAGNOSIS — I82402 Acute embolism and thrombosis of unspecified deep veins of left lower extremity: Secondary | ICD-10-CM | POA: Diagnosis not present

## 2022-01-08 DIAGNOSIS — R809 Proteinuria, unspecified: Secondary | ICD-10-CM | POA: Diagnosis not present

## 2022-01-08 LAB — DRVVT CONFIRM: dRVVT Confirm: 0.8 ratio (ref 0.8–1.2)

## 2022-01-08 LAB — ANTIPHOSPHOLIPID SYNDROME PROF
Anticardiolipin IgG: <9 GPL U/mL (ref 0–14)
Anticardiolipin IgM: <9 MPL U/mL (ref 0–12)
DRVVT: 64.2 s — ABNORMAL HIGH (ref 0.0–47.0)
PTT Lupus Anticoagulant: 34.4 s (ref 0.0–43.5)

## 2022-01-08 LAB — DRVVT MIX: dRVVT Mix: 50.1 s — ABNORMAL HIGH (ref 0.0–40.4)

## 2022-01-13 ENCOUNTER — Telehealth: Payer: Self-pay

## 2022-01-13 NOTE — Telephone Encounter (Signed)
Called and spoke to pt. Pt verbalized understanding and stated that he will use up his current '5mg'$  supply and cut in half.

## 2022-01-13 NOTE — Telephone Encounter (Signed)
-----   Message from Earlie Server, MD sent at 01/11/2022  9:15 PM EST ----- Let him know that I recommend him to decrease Eliquis 2.'5mg'$  BID. Ask if he would like a new Rx.   Current Eliquis '5mg'$  was prescribed by PCP

## 2022-01-15 DIAGNOSIS — R809 Proteinuria, unspecified: Secondary | ICD-10-CM | POA: Diagnosis not present

## 2022-01-15 DIAGNOSIS — E1122 Type 2 diabetes mellitus with diabetic chronic kidney disease: Secondary | ICD-10-CM | POA: Diagnosis not present

## 2022-01-15 DIAGNOSIS — I509 Heart failure, unspecified: Secondary | ICD-10-CM | POA: Diagnosis not present

## 2022-01-15 DIAGNOSIS — N281 Cyst of kidney, acquired: Secondary | ICD-10-CM | POA: Diagnosis not present

## 2022-01-15 DIAGNOSIS — N2581 Secondary hyperparathyroidism of renal origin: Secondary | ICD-10-CM | POA: Diagnosis not present

## 2022-01-15 DIAGNOSIS — N1832 Chronic kidney disease, stage 3b: Secondary | ICD-10-CM | POA: Diagnosis not present

## 2022-01-15 DIAGNOSIS — I10 Essential (primary) hypertension: Secondary | ICD-10-CM | POA: Diagnosis not present

## 2022-01-15 DIAGNOSIS — I82402 Acute embolism and thrombosis of unspecified deep veins of left lower extremity: Secondary | ICD-10-CM | POA: Diagnosis not present

## 2022-01-15 DIAGNOSIS — D631 Anemia in chronic kidney disease: Secondary | ICD-10-CM | POA: Diagnosis not present

## 2022-01-20 DIAGNOSIS — H35359 Cystoid macular degeneration, unspecified eye: Secondary | ICD-10-CM | POA: Diagnosis not present

## 2022-01-20 DIAGNOSIS — H3322 Serous retinal detachment, left eye: Secondary | ICD-10-CM | POA: Diagnosis not present

## 2022-01-20 DIAGNOSIS — E113513 Type 2 diabetes mellitus with proliferative diabetic retinopathy with macular edema, bilateral: Secondary | ICD-10-CM | POA: Diagnosis not present

## 2022-01-20 DIAGNOSIS — H15001 Unspecified scleritis, right eye: Secondary | ICD-10-CM | POA: Diagnosis not present

## 2022-01-27 ENCOUNTER — Telehealth: Payer: Self-pay | Admitting: Internal Medicine

## 2022-01-27 NOTE — Telephone Encounter (Signed)
Patient came by and dropped of patient assistance form for his Eliquis. Form was left in [place] [facility] folder up front. Patient would life form to be faxed for him along with his financial verification letter that I made a copy of.

## 2022-01-28 NOTE — Telephone Encounter (Signed)
Signed forms faxed to number on forms.

## 2022-01-28 NOTE — Telephone Encounter (Signed)
Form placed in Dr Letvak's inbox on his desk 

## 2022-02-10 ENCOUNTER — Other Ambulatory Visit: Payer: Self-pay | Admitting: Internal Medicine

## 2022-03-09 ENCOUNTER — Emergency Department
Admission: EM | Admit: 2022-03-09 | Discharge: 2022-03-09 | Disposition: A | Discharge status: Home / Self Care | Payer: Medicare PPO | Attending: Emergency Medicine | Admitting: Emergency Medicine

## 2022-03-09 ENCOUNTER — Other Ambulatory Visit: Payer: Self-pay

## 2022-03-09 ENCOUNTER — Emergency Department: Payer: Medicare PPO

## 2022-03-09 DIAGNOSIS — I5032 Chronic diastolic (congestive) heart failure: Secondary | ICD-10-CM | POA: Diagnosis not present

## 2022-03-09 DIAGNOSIS — R600 Localized edema: Secondary | ICD-10-CM | POA: Diagnosis not present

## 2022-03-09 DIAGNOSIS — E1122 Type 2 diabetes mellitus with diabetic chronic kidney disease: Secondary | ICD-10-CM | POA: Diagnosis not present

## 2022-03-09 DIAGNOSIS — R609 Edema, unspecified: Secondary | ICD-10-CM

## 2022-03-09 DIAGNOSIS — M7989 Other specified soft tissue disorders: Secondary | ICD-10-CM | POA: Diagnosis not present

## 2022-03-09 DIAGNOSIS — M79601 Pain in right arm: Secondary | ICD-10-CM | POA: Diagnosis present

## 2022-03-09 DIAGNOSIS — N183 Chronic kidney disease, stage 3 unspecified: Secondary | ICD-10-CM | POA: Diagnosis not present

## 2022-03-09 DIAGNOSIS — M109 Gout, unspecified: Secondary | ICD-10-CM | POA: Insufficient documentation

## 2022-03-09 DIAGNOSIS — M25531 Pain in right wrist: Secondary | ICD-10-CM | POA: Diagnosis not present

## 2022-03-09 DIAGNOSIS — R079 Chest pain, unspecified: Secondary | ICD-10-CM | POA: Diagnosis not present

## 2022-03-09 DIAGNOSIS — I130 Hypertensive heart and chronic kidney disease with heart failure and stage 1 through stage 4 chronic kidney disease, or unspecified chronic kidney disease: Secondary | ICD-10-CM | POA: Insufficient documentation

## 2022-03-09 DIAGNOSIS — M10031 Idiopathic gout, right wrist: Secondary | ICD-10-CM | POA: Diagnosis not present

## 2022-03-09 LAB — COMPREHENSIVE METABOLIC PANEL
ALT: 13 U/L (ref 0–44)
AST: 17 U/L (ref 15–41)
Albumin: 3.3 g/dL — ABNORMAL LOW (ref 3.5–5.0)
Alkaline Phosphatase: 59 U/L (ref 38–126)
Anion gap: 9 (ref 5–15)
BUN: 28 mg/dL — ABNORMAL HIGH (ref 8–23)
CO2: 24 mmol/L (ref 22–32)
Calcium: 8.6 mg/dL — ABNORMAL LOW (ref 8.9–10.3)
Chloride: 104 mmol/L (ref 98–111)
Creatinine, Ser: 1.78 mg/dL — ABNORMAL HIGH (ref 0.61–1.24)
GFR, Estimated: 40 mL/min — ABNORMAL LOW (ref >60)
Glucose, Bld: 168 mg/dL — ABNORMAL HIGH (ref 70–99)
Potassium: 3.3 mmol/L — ABNORMAL LOW (ref 3.5–5.1)
Sodium: 137 mmol/L (ref 135–145)
Total Bilirubin: 0.9 mg/dL (ref 0.3–1.2)
Total Protein: 8.4 g/dL — ABNORMAL HIGH (ref 6.5–8.1)

## 2022-03-09 LAB — CBC WITH DIFFERENTIAL/PLATELET
Abs Immature Granulocytes: 0.02 10*3/uL (ref 0.00–0.07)
Basophils Absolute: 0 10*3/uL (ref 0.0–0.1)
Basophils Relative: 0 %
Eosinophils Absolute: 0.1 10*3/uL (ref 0.0–0.5)
Eosinophils Relative: 1 %
HCT: 35.1 % — ABNORMAL LOW (ref 39.0–52.0)
Hemoglobin: 10.8 g/dL — ABNORMAL LOW (ref 13.0–17.0)
Immature Granulocytes: 0 %
Lymphocytes Relative: 33 %
Lymphs Abs: 3.3 10*3/uL (ref 0.7–4.0)
MCH: 26.5 pg (ref 26.0–34.0)
MCHC: 30.8 g/dL (ref 30.0–36.0)
MCV: 86.2 fL (ref 80.0–100.0)
Monocytes Absolute: 0.9 10*3/uL (ref 0.1–1.0)
Monocytes Relative: 9 %
Neutro Abs: 5.6 10*3/uL (ref 1.7–7.7)
Neutrophils Relative %: 57 %
Platelets: 256 10*3/uL (ref 150–400)
RBC: 4.07 MIL/uL — ABNORMAL LOW (ref 4.22–5.81)
RDW: 17 % — ABNORMAL HIGH (ref 11.5–15.5)
WBC: 10 10*3/uL (ref 4.0–10.5)
nRBC: 0 % (ref 0.0–0.2)

## 2022-03-09 LAB — URIC ACID: Uric Acid, Serum: 8.3 mg/dL (ref 3.7–8.6)

## 2022-03-09 LAB — BRAIN NATRIURETIC PEPTIDE: B Natriuretic Peptide: 51.5 pg/mL (ref 0.0–100.0)

## 2022-03-09 LAB — SEDIMENTATION RATE: Sed Rate: 100 mm/hr — ABNORMAL HIGH (ref 0–20)

## 2022-03-09 MED ORDER — OXYCODONE-ACETAMINOPHEN 5-325 MG PO TABS: 1.0000 | ORAL_TABLET | Freq: Four times a day (QID) | ORAL | 0 refills | Status: AC | PRN

## 2022-03-09 MED ORDER — OXYCODONE-ACETAMINOPHEN 5-325 MG PO TABS
1.0000 | ORAL_TABLET | Freq: Once | ORAL | Status: AC
  Administered 2022-03-09: 1 via ORAL
  Filled 2022-03-09: qty 1

## 2022-03-09 MED ORDER — PREDNISONE 10 MG PO TABS: ORAL_TABLET | ORAL | 0 refills | Status: DC

## 2022-03-09 NOTE — ED Triage Notes (Signed)
Pt states right arm pain with swelling and leg swelling. Pt states history of gout. Pt states pain for the last week, started after eating a seafood bowl.

## 2022-03-09 NOTE — ED Notes (Signed)
XL sling applied to the pts right arm. ACE wrap applied to right hand for swelling. Vitals stable. Pts ride wheeled him to the car via wheelchair.

## 2022-03-09 NOTE — ED Notes (Signed)
Sent rainbow to the lab. 

## 2022-03-09 NOTE — ED Provider Notes (Signed)
[facility] Provider Note    Event Date/Time   First MD Initiated Contact with Patient 03/09/22 1204     (approximate)   History   Chief Complaint Arm Injury and Leg Swelling   HPI Adam [name] is a 72 y.o. male, hypertension, stage III CKD, gout, GERD, chronic diastolic heart failure, type 2 diabetes, DVT, presents to the emergency department for evaluation of right arm pain/swelling and leg swelling.  He states that the pain and swelling in his right hand/wrist has been going on for the past 7 days, started after eating a seafood bowl.  He suspects that this is related to gout, though he has never had gout in this particular area before.  He normally gets some in his elbows.  Denies any recent falls or injuries or punctures in the skin.  In addition, patient is also had swelling develop in his lower extremities bilaterally.  He has a history of heart failure and is currently on furosemide, but it is reportedly not getting enough fluid off of his lungs.  Denies fever/chills, myalgias, chest pain, shortness of breath, Donnell pain, flank pain, nausea vomit, diarrhea, urinary symptoms, headache, weakness, rash/lesions, or dizziness/lightheadedness.  History Limitations: No limitations.        Physical Exam  Triage Vital Signs: ED Triage Vitals  Enc Vitals Group     BP 03/09/22 1137 (!) 168/83     Pulse Rate 03/09/22 1137 99     Resp 03/09/22 1137 19     Temp 03/09/22 1149 98.7 F (37.1 C)     Temp Source 03/09/22 1137 Oral     SpO2 03/09/22 1137 95 %     Weight 03/09/22 1146 254 lb (115.2 kg)     Height 03/09/22 1146 6' (1.829 m)     Head Circumference --      Peak Flow --      Pain Score 03/09/22 1146 10     Pain Loc --      Pain Edu? --      Excl. in [place]? --     Most recent vital signs: Vitals:   03/09/22 1149 03/09/22 1602  BP:  (!) 157/81  Pulse:  91  Resp:  16  Temp: 98.7 F (37.1 C) 98.7 F (37.1 C)  SpO2:  97%    General: Awake,  NAD.  Skin: Warm, dry. No rashes or lesions.  Eyes: PERRL. Conjunctivae normal.  CV: Good peripheral perfusion.  Resp: Normal effort.  Lung sounds are clear bilaterally. Abd: Soft, non-tender. No distention.  Neuro: At baseline. No gross neurological deficits.  Musculoskeletal: Normal ROM of all extremities.  Focused Exam: Right wrist/hand is diffusely swollen with mild erythema along the wrist joint.  Tenderness with manipulation of the extremity.  PMS intact distally.  Limited range of motion due to the pain.  No breaks in the skin.  3+ pitting edema in the lower extremities bilaterally.  No warmth, erythema, or tenderness.  PMS intact distally.  Physical Exam    ED Results / Procedures / Treatments  Labs (all labs ordered are listed, but only abnormal results are displayed) Labs Reviewed  CBC WITH DIFFERENTIAL/PLATELET - Abnormal; Notable for the following components:      Result Value   RBC 4.07 (*)    Hemoglobin 10.8 (*)    HCT 35.1 (*)    RDW 17.0 (*)    All other components within normal limits  COMPREHENSIVE METABOLIC PANEL - Abnormal; Notable for the  following components:   Potassium 3.3 (*)    Glucose, Bld 168 (*)    BUN 28 (*)    Creatinine, Ser 1.78 (*)    Calcium 8.6 (*)    Total Protein 8.4 (*)    Albumin 3.3 (*)    GFR, Estimated 40 (*)    All other components within normal limits  SEDIMENTATION RATE - Abnormal; Notable for the following components:   Sed Rate 100 (*)    All other components within normal limits  URIC ACID  BRAIN NATRIURETIC PEPTIDE     EKG N/A.    RADIOLOGY  ED Provider Interpretation: I personally viewed and interpreted these images.  Ultrasound shows no evidence of DVTs bilaterally.  Wrist x-ray shows soft tissue swelling, otherwise no evidence of osseous abnormalities.  Chest x-ray negative.  US Venous Img Lower Bilateral  Result Date: 03/09/2022 CLINICAL DATA:  Lower extremity swelling EXAM: BILATERAL LOWER EXTREMITY VENOUS  DOPPLER ULTRASOUND TECHNIQUE: Gray-scale sonography with compression, as well as color and duplex ultrasound, were performed to evaluate the deep venous system(s) from the level of the common femoral vein through the popliteal and proximal calf veins. COMPARISON:  10/14/2021 left lower extremity and 07/06/2021 bilateral lower extremity venous Doppler scans. FINDINGS: VENOUS Normal compressibility of the common femoral, superficial femoral, and popliteal veins, as well as the visualized calf veins. Visualized portions of profunda femoral vein and great saphenous vein unremarkable. No filling defects to suggest DVT on grayscale or color Doppler imaging. Doppler waveforms show normal direction of venous flow, normal respiratory plasticity and response to augmentation. Limited views of the contralateral common femoral vein are unremarkable. OTHER Subcutaneous edema noted at the level of the ankles bilaterally. Limitations: none IMPRESSION: No evidence of deep venous thrombosis in either lower extremity. Electronically Signed   By: Ilona Sorrel M.D.   On: 03/09/2022 14:36   DG Wrist Complete Right  Result Date: 03/09/2022 CLINICAL DATA:  Right wrist pain and swelling EXAM: RIGHT WRIST - COMPLETE 3+ VIEW COMPARISON:  None Available. FINDINGS: No acute fracture. No dislocation. Mild widening of the scapholunate interval. Mild degenerative changes of the wrist. Scattered subchondral lucencies throughout the carpal bones may represent degenerative subchondral cysts or intraosseous ganglia. Small erosions secondary to inflammatory crystalline arthropathy is not excluded. There is prominent soft tissue swelling over the dorsum of the hand. IMPRESSION: 1. Prominent soft tissue swelling over the dorsum of the hand. No acute fracture or dislocation. 2. Mild degenerative changes of the right wrist. Scattered subchondral lucencies throughout the carpal bones may represent degenerative subchondral cysts or intraosseous ganglia.  Small erosions secondary to inflammatory crystalline arthropathy is not excluded. 3. 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 Electronically Signed   By: Davina Poke D.O.   On: 03/09/2022 12:58   DG Chest 2 View  Result Date: 03/09/2022 CLINICAL DATA:  wrist pain, swelling. leg swelling EXAM: CHEST - 2 VIEW COMPARISON:  07/06/2021 chest radiograph. FINDINGS: Stable cardiomediastinal silhouette with normal heart size. No pneumothorax. No pleural effusion. Lungs appear clear, with no acute consolidative airspace disease and no pulmonary edema. IMPRESSION: No active cardiopulmonary disease. Electronically Signed   By: Ilona Sorrel M.D.   On: 03/09/2022 12:54    PROCEDURES:  Critical Care performed: N/A.  Procedures    MEDICATIONS ORDERED IN ED: Medications  oxyCODONE-acetaminophen (PERCOCET/ROXICET) 5-325 MG per tablet 1 tablet (1 tablet Oral Given 03/09/22 1223)     IMPRESSION / MDM / ASSESSMENT AND PLAN /  ED COURSE  I reviewed the triage vital signs and the nursing notes.                              Differential diagnosis includes, but is not limited to, heart failure, gout, DVTs, cellulitis, septic arthritis,  ED Course Patient appears well, NAD.  Currently endorsing mild/moderate pain.  Will treat with single dose of oxycodone/acetaminophen.  CBC shows no leukocytosis.  Anemia present with hemoglobin 10.8, consistent with baseline.  CMP shows mild hypokalemia at 3.3.  Elevated creatinine 1.78, though consistent with baseline.  No transaminitis.  Segmentation rate elevated at 100.  Uric acid unremarkable 8.3.  BNP unremarkable at 51.5.  Assessment/Plan Patient presents with right wrist/hand pain x 1 week, as well as progressive bilateral lower extremity edema.  In regards to his right upper extremity pain, I suspect this is likely a gout flare.  He states that he has responded well to prednisone treatment  in the past.  Will provide.  Advised him to closely monitor his blood sugar throughout this treatment.  Very low suspicion for infectious etiologies given unremarkable labs and the clinical presentation.  In regards to his bilateral lower extremity edema, I suspect this is likely related to his chronic heart failure, though he appears well clinically.  BMP is unremarkable.  His chest x-ray shows no evidence of effusions.  He is currently on furosemide.  Encouraged him to continue taking this and to trial compression stockings as well.  Recommend they follow-up with his primary care provider within the next week for reevaluation.  He was amenable to this.  Will provide him with prednisone and oxycodone acetaminophen to help manage his symptoms.  Spoke with Dr. Charna Archer about the plan for the patient, who agrees.  Will discharge.  Considered admission for this patient, but given his stable presentation and unremarkable workup, is unlikely benefit from admission.  Provided the patient with anticipatory guidance, return precautions, and educational material. Encouraged the patient to return to the emergency department at any time if they begin to experience any new or worsening symptoms. Patient expressed understanding and agreed with the plan.   Patient's presentation is most consistent with acute complicated illness / injury requiring diagnostic workup.       FINAL CLINICAL IMPRESSION(S) / ED DIAGNOSES   Final diagnoses:  Acute gout of right wrist, unspecified cause  Peripheral edema     Rx / DC Orders   ED Discharge Orders          Ordered    predniSONE (DELTASONE) 10 MG tablet  Q breakfast        03/09/22 1607    oxyCODONE-acetaminophen (PERCOCET/ROXICET) 5-325 MG tablet  Every 6 hours PRN        03/09/22 1607             Note:  This document was prepared using Dragon voice recognition software and may include unintentional dictation errors.   Teodoro Spray, [place] 03/09/22  1612    Blake Divine, MD 03/09/22 [phone]

## 2022-03-09 NOTE — Discharge Instructions (Signed)
-  I believe that the swelling in your right wrist is due to a gout flare.  Please take the prednisone taper as prescribed.  You may additionally take oxycodone/acetaminophen as needed.  Be careful though as this may make you dizzy/drowsy.  -Your leg swelling is likely related to your heart failure.  Your blood work and your chest x-ray looks well though.  Recommend elevating your legs throughout the day and utilizing compression stockings to help reduce some of the swelling.  -Follow-up with your primary care provider as needed.  -Return to the emergency department anytime if you begin to experience any new or worsening symptoms.

## 2022-03-10 ENCOUNTER — Telehealth: Payer: Self-pay

## 2022-03-10 NOTE — Telephone Encounter (Signed)
Seen in ED 03/09/22   [place] [facility] Patient Name: Adam [name] Gender: Male DOB: March 04, 1950 Age: 72 Y 1 M 28 D Return Phone Number: [phone] (Primary) Address: City/ State/ Zip: [place] [address] Client [place] [facility] - Client Client Site [place] Provider Viviana Simpler- MD Contact Type Call Who Is Calling Patient / Member / Family / Caregiver Call Type Triage / Clinical Caller Name Adam [name] Relationship To Patient Spouse Return Phone Number [phone] (Primary) Chief Complaint Leg Swelling And Edema Reason for Call Symptomatic / Request for [place] states her husband is having swelling in both feet, legs, and in right hand/arm. Translation No Nurse Assessment Nurse: Adam Batten, RN, Adam Date/Time Eilene [name] Time): 03/09/2022 10:08:42 AM Confirm and document reason for call. If symptomatic, describe symptoms. ---Caller states husband has had swelling in BLE and swelling to his right arm and hand that has also been painful, states this AM it is significantly worse. Has had this happen a few months ago and was seen by a provider for it, had cast placed on it and was put on prednisone, was told it was arthritis and gout. Does the patient have any new or worsening symptoms? ---Yes Will a triage be completed? ---Yes Related visit to physician within the last 2 weeks? ---No Does the PT have any chronic conditions? (i.e. diabetes, asthma, this includes High risk factors for pregnancy, etc.) ---Yes List chronic conditions. ---hypertension, CHF, diabetes Is this a behavioral health or substance abuse call? ---No Guidelines Guideline Title Affirmed Question Affirmed Notes Nurse Date/Time Eilene [name] Time) Leg Swelling and Edema [1] MODERATE leg swelling (e.g., swelling extends up to knees)  AND [2] new-onset or worsening Adam [name], Adam 03/09/2022 10:14:47 AM PLEASE NOTE: All timestamps contained within this report are represented as [place] Standard Time. CONFIDENTIALTY NOTICE: This fax transmission is intended only for the addressee. It contains information that is legally privileged, confidential or otherwise protected from use or disclosure. If you are not the intended recipient, you are strictly prohibited from reviewing, disclosing, copying using or disseminating any of this information or taking any action in reliance on or regarding this information. If you have received this fax in error, please notify us immediately by telephone so that we can arrange for its return to [place]. Phone: [phone], Toll-Free: [phone], Fax: [phone] Page: 2 of 2 Call Id: [id number] Guidelines Guideline Title Affirmed Question Affirmed Notes Nurse Date/Time Eilene [name] Time) Arm Swelling and Edema [1] Can't use arm or can barely use arm AND [2] new-onset Adam [name], Adam 03/09/2022 10:19:53 AM Disp. Time Eilene [name] Time) Disposition Final User 03/09/2022 10:19:16 AM See PCP within 24 Hours Adam Batten, RN, Adam 03/09/2022 10:23:05 AM Go to ED Now Yes Adam Batten, RN, Adam Final Disposition 03/09/2022 10:23:05 AM Go to ED Now Yes Adam Batten, RN, Adam Caller Disagree/Comply Comply Caller Understands Yes PreDisposition Did not know what to do Care Advice Given Per Guideline SEE PCP WITHIN 24 HOURS: * IF OFFICE WILL BE OPEN: You need to be examined within the next 24 hours. Call your doctor (or NP/PA) when the office opens and make an appointment. CALL BACK IF: * Breathing difficulty or chest pain occurs * You become worse CARE ADVICE given per Leg Swelling and Edema (Adult) guideline. GO TO ED NOW: * You need to be seen in the Emergency Department. * Go to the ED at ___________ [place]: *  Bring a list of your current medicines when you go to the Emergency Department (ER). CARE ADVICE given  per Arm Swelling and Edema (Adult) guideline. Comments User: Adam Seal, RN Date/Time Eilene [name] Time): 03/09/2022 10:11:58 AM rates pain 10/10, tylenol has not been helping User: Adam Seal, RN Date/Time Eilene [name] Time): 03/09/2022 10:16:09 AM pain is in hand Referrals GO TO FACILITY UNDECIDED REFERRED TO PCP OFFICE

## 2022-03-10 NOTE — Telephone Encounter (Signed)
Transition Care Management Follow-up Telephone Call Date of discharge and from where: 03/09/22 [facility] ED How have you been since you were released from the hospital? Feels a lot better since being seen at ED Any questions or concerns? No  Items Reviewed: Did the pt receive and understand the discharge instructions provided? Yes  Medications obtained and verified? Yes  Other?  NA Any new allergies since your discharge? No  Dietary orders reviewed? Yes Do you have support at home? Yes   Home Care and Equipment/Supplies: Were home health services ordered? not applicable If so, what is the name of the agency? NA  Has the agency set up a time to come to the patient's home? not applicable Were any new equipment or medical supplies ordered?  No What is the name of the medical supply agency? NA Were you able to get the supplies/equipment? not applicable Do you have any questions related to the use of the equipment or supplies? No  Functional Questionnaire: (I = Independent and D = Dependent) ADLs: D  Bathing/Dressing- D  Meal Prep- D  Eating- I  Maintaining continence- I  Transferring/Ambulation- I  Managing Meds- I  Follow up appointments reviewed:  PCP Hospital f/u appt confirmed? Yes  Scheduled to see Dr. Silvio Pate on 03/17/22 @ 8:45p. [facility] f/u appt confirmed?  NA   Are transportation arrangements needed? No  If their condition worsens, is the pt aware to call PCP or go to the Emergency Dept.? Yes Was the patient provided with contact information for the PCP's office or ED? Yes Was to pt encouraged to call back with questions or concerns? Yes

## 2022-03-11 ENCOUNTER — Telehealth: Payer: Self-pay

## 2022-03-11 NOTE — Telephone Encounter (Signed)
Spoke to pt's wife to see how he was doing after recent ER visit. She said his han looks better. His legs are doing better, also. She is putting compression stockings on his legs. He is scheduled for a 1 week follow-up here on 03-17-22.

## 2022-03-12 IMAGING — CT CT BIOPSY
1 of 12 series · 6 of 32 positions shown, 11 images · non-contrast
Comparison: none

CLINICAL DATA: Enlarging 3.8 cm mass emanating from the upper pole
of the right kidney. The patient presents for planned biopsy and
ablation of the mass.

[Series 2: i-spiral 5.0 bf37 · axial · 0.98mm/px · z∈[-73,+116]mm · 6 of 76 slices shown, 11 images]
[im 11/76  soft-tissue]
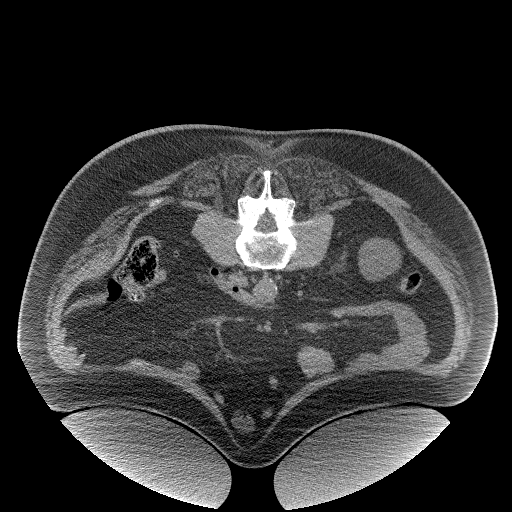
[im 11/76  bone]
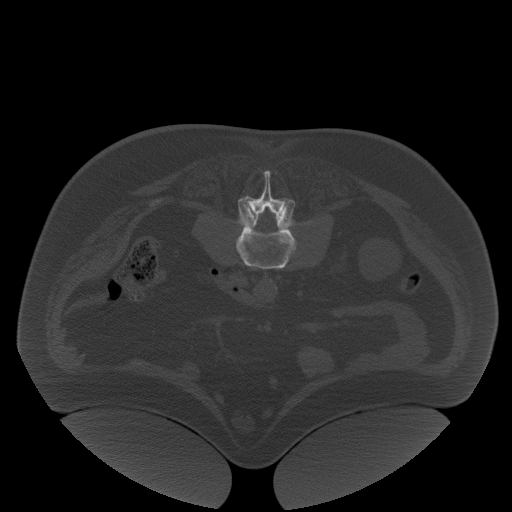
[im 22/76  soft-tissue]
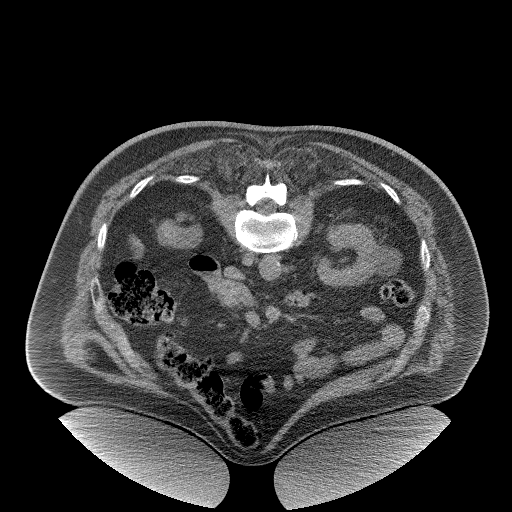
[im 33/76  soft-tissue]
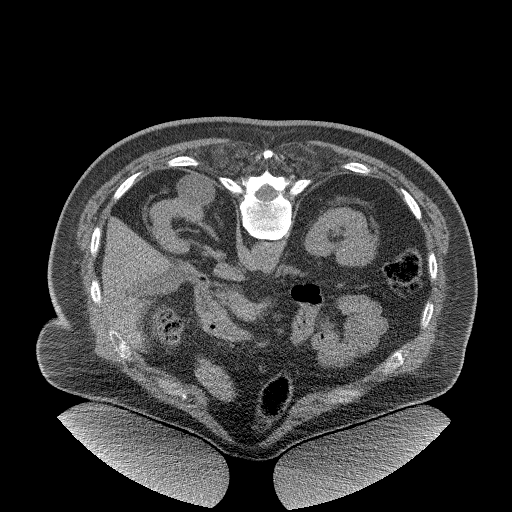
[im 33/76  lung]
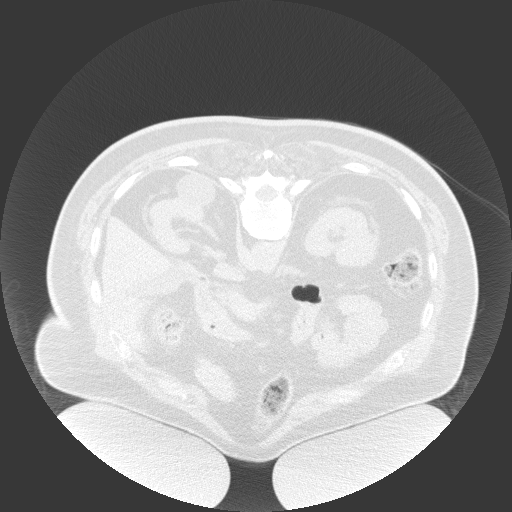
[im 43/76  soft-tissue]
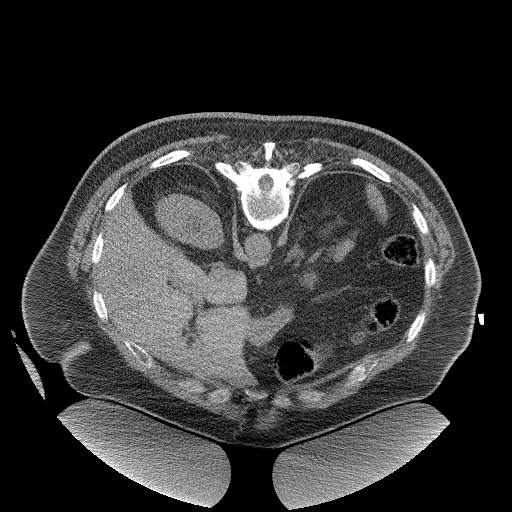
[im 43/76  lung]
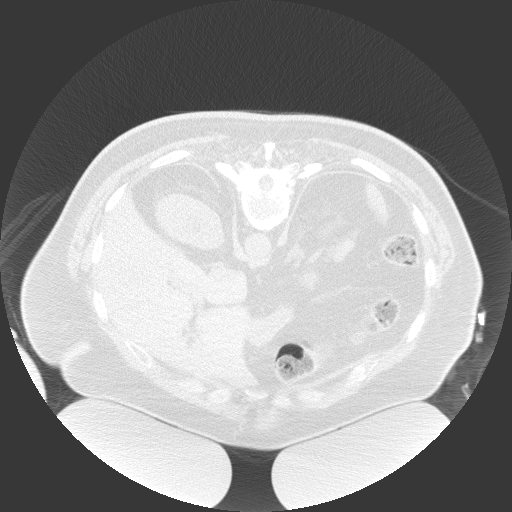
[im 54/76  soft-tissue]
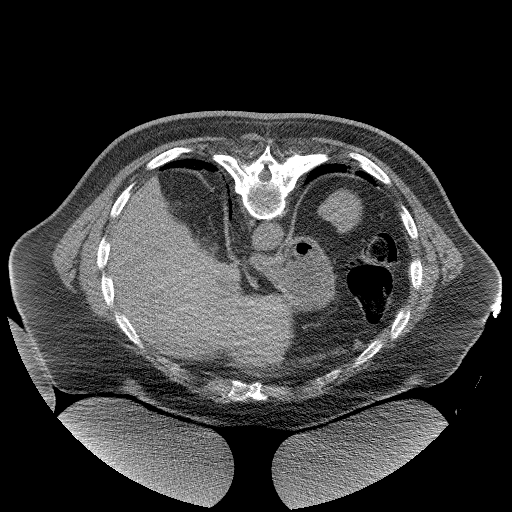
[im 54/76  lung]
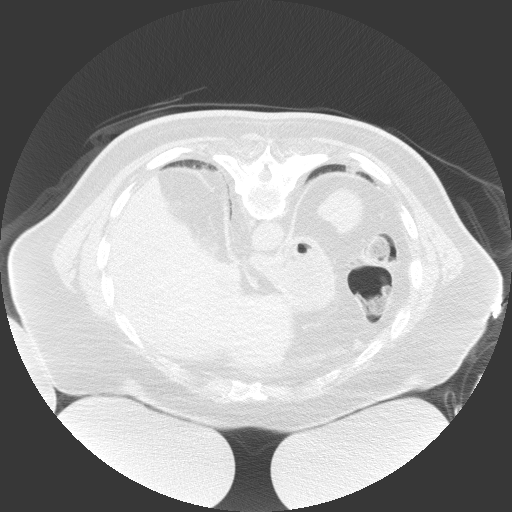
[im 65/76  soft-tissue]
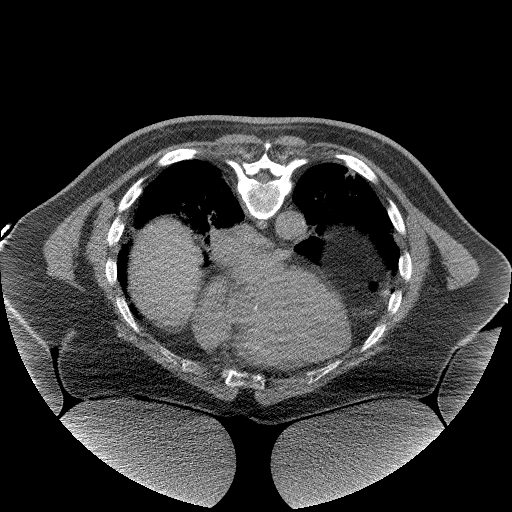
[im 65/76  lung]
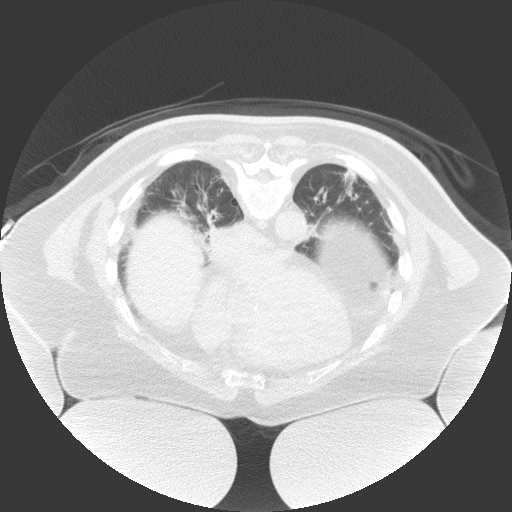

[6 of 32 positions shown; findings below may reference images not displayed]

EXAM:
CT-GUIDED CORE BIOPSY OF RIGHT RENAL MASS

CT-GUIDED PERCUTANEOUS CRYOABLATION OF RIGHT RENAL MASS

ANESTHESIA/SEDATION:
General

MEDICATIONS:
1 gram IV vancomycin. The antibiotic was administered in an
appropriate time interval prior to needle puncture of the skin.

CONTRAST:  None

PROCEDURE:
The procedure, risks, benefits, and alternatives were explained to
the patient. Questions regarding the procedure were encouraged and
answered. The patient understands and consents to the procedure. A
time-out was performed prior to initiating the procedure.

The patient was placed under general anesthesia. Initial unenhanced
CT was performed in a prone position to localize the right renal
mass. Ultrasound was also utilized during the procedure for needle
placement.

The right posterior flank region was prepped with chlorhexidine in a
sterile fashion, and a sterile drape was applied covering the
operative field. A sterile gown and sterile gloves were used for the
procedure. Hydrodissection fluid was created mixing 250 mL of
sterile saline with 5 mL of Omnipaque 300 contrast.

A 17 gauge trocar needle was advanced into the right renal mass.
Core biopsy was performed with an 18 gauge automated core biopsy
device. A single core biopsy sample was submitted in formalin for
pathologic analysis.

Under CT guidance, a 3 separate Galil Ice Klever Jumper percutaneous
cryoablation probes were advanced into the right renal mass. Probe
positioning was confirmed by CT prior to cryoablation.
Hydrodissection was performed between the mass and liver with
injection of approximately 120 mL of the hydrodissection fluid via a
21 gauge needle.

Cryoablation was performed through the 3 probes simultaneously.
Initial 10 minute cycle of cryoablation was performed. This was
followed by a 8 minute thaw cycle. A second 10 minute cycle of
cryoablation was then performed. During ablation, periodic CT
imaging was performed to monitor ice ball formation and morphology.
After active thaw and cautery cycle, the cryoablation probes were
removed.

COMPLICATIONS:
None
FINDINGS: Mass emanating from the posterior upper pole of the right kidney
shows no significant growth since prior MRI in [REDACTED] and measures
approximately 3.8 cm in greatest diameter. Biopsy yielded solid
tissue. Adequate ice ball formation was noted during treatment
surrounding the mass.
IMPRESSION: CT guided percutaneous biopsy and cryoablation of right renal mass.
The patient will be observed overnight. Initial follow-up will be
performed in approximately 4 weeks.

## 2022-03-17 ENCOUNTER — Encounter: Payer: Self-pay | Admitting: Internal Medicine

## 2022-03-17 ENCOUNTER — Ambulatory Visit: Payer: Medicare PPO | Admitting: Internal Medicine

## 2022-03-17 VITALS — BP 138/88 | HR 76 | Temp 97.0°F | Ht 72.0 in | Wt 256.0 lb

## 2022-03-17 DIAGNOSIS — M1000 Idiopathic gout, unspecified site: Secondary | ICD-10-CM

## 2022-03-17 MED ORDER — PREDNISONE 20 MG PO TABS: 40.0000 mg | ORAL_TABLET | Freq: Every day | ORAL | 0 refills | Status: DC

## 2022-03-17 MED ORDER — COLCHICINE 0.6 MG PO TABS: 0.6000 mg | ORAL_TABLET | Freq: Two times a day (BID) | ORAL | 1 refills | Status: DC | PRN

## 2022-03-17 NOTE — Patient Instructions (Signed)
Low-Purine Eating Plan A low-purine eating plan involves making food choices to limit your purine intake. Purine is a kind of uric acid. Too much uric acid in your blood can cause certain conditions, such as gout and kidney stones. Eating a low-purine diet may help control these conditions. What are tips for following this plan? Shopping Avoid buying products that contain high-fructose corn syrup. Check for this on food labels. It is commonly found in many processed foods and soft drinks. Be sure to check for it in baked goods such as cookies, canned fruits, and cereals and cereal bars. Avoid buying veal, chicken breast with skin, lamb, and organ meats such as liver. These types of meats tend to have the highest purine content. Choose dairy products. These may lower uric acid levels. Avoid certain types of fish. Not all fish and seafood have high purine content. Examples with high purine content include anchovies, trout, tuna, sardines, and salmon. Avoid buying beverages that contain alcohol, particularly beer and hard liquor. Alcohol can affect the way your body gets rid of uric acid. Meal planning  Learn which foods do or do not affect you. If you find out that a food tends to cause your gout symptoms to flare up, avoid eating that food. You can enjoy foods that do not cause problems. If you have any questions about a food item, talk with your dietitian or health care provider. Reduce the overall amount of meat in your diet. When you do eat meat, choose ones with lower purine content. Include plenty of fruits and vegetables. Although some vegetables may have a high purine content--such as asparagus, mushrooms, spinach, or cauliflower--it has been shown that these do not contribute to uric acid blood levels as much. Consume at least 1 dairy serving a day. This has been shown to decrease uric acid levels. General information If you drink alcohol: Limit how much you have to: 0-1 drink a day for  women who are not pregnant. 0-2 drinks a day for men. Know how much alcohol is in a drink. In the [place] one drink equals one 12 oz bottle of beer (355 mL), one 5 oz glass of wine (148 mL), or one 1 oz glass of hard liquor (44 mL). Drink plenty of water. Try to drink enough to keep your urine pale yellow. Fluids can help remove uric acid from your body. Work with your health care provider and dietitian to develop a plan to achieve or maintain a healthy weight. Losing weight may help reduce uric acid in your blood. What foods are recommended? The following are some types of foods that are good choices when limiting purine intake: Fresh or frozen fruits and vegetables. Whole grains, breads, cereals, and pasta. Rice. Beans, peas, legumes. Nuts and seeds. Dairy products. Fats and oils. The items listed above may not be a complete list. Talk with a dietitian about what dietary choices are best for you. What foods are not recommended? Limit your intake of foods high in purines, including: Beer and other alcohol. Meat-based gravy or sauce. Canned or fresh fish, such as: Anchovies, sardines, herring, salmon, and tuna. Mussels and scallops. Codfish, trout, and haddock. Bacon, veal, chicken breast with skin, and lamb. Organ meats, such as: Liver or kidney. Tripe. Sweetbreads (thymus gland or pancreas). Wild game or goose. Yeast or yeast extract supplements. Drinks sweetened with high-fructose corn syrup, such as soda. Processed foods made with high-fructose corn syrup. The items listed above may not be a complete list of foods   and beverages you should limit. Contact a dietitian for more information. Summary Eating a low-purine diet may help control conditions caused by too much uric acid in the body, such as gout or kidney stones. Choose low-purine foods, limit alcohol, and limit high-fructose corn syrup. You will learn over time which foods do or do not affect you. If you find out that a  food tends to cause your gout symptoms to flare up, avoid eating that food. This information is not intended to replace advice given to you by your health care provider. Make sure you discuss any questions you have with your health care provider. Document Revised: 01/24/2021 Document Reviewed: 01/24/2021 Elsevier Patient Education  2023 [employer]  

## 2022-03-17 NOTE — Assessment & Plan Note (Signed)
Seems to have been brought on by predictable food (seafood). Will extend prednisone 40 x 5, 20 x 5 Add colchicine 0.6 bid prn If recurs, should consider allopurinol Low purine diet info given
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
## 2022-03-27 ENCOUNTER — Ambulatory Visit: Payer: Medicare PPO | Admitting: Internal Medicine

## 2022-03-27 ENCOUNTER — Encounter: Payer: Self-pay | Admitting: Internal Medicine

## 2022-03-27 VITALS — BP 138/84 | HR 60 | Temp 97.2°F | Ht 71.0 in | Wt 251.0 lb

## 2022-03-27 DIAGNOSIS — J4520 Mild intermittent asthma, uncomplicated: Secondary | ICD-10-CM | POA: Diagnosis not present

## 2022-03-27 DIAGNOSIS — I82412 Acute embolism and thrombosis of left femoral vein: Secondary | ICD-10-CM

## 2022-03-27 DIAGNOSIS — I5032 Chronic diastolic (congestive) heart failure: Secondary | ICD-10-CM | POA: Diagnosis not present

## 2022-03-27 DIAGNOSIS — I700 Atherosclerosis of aorta: Secondary | ICD-10-CM

## 2022-03-27 DIAGNOSIS — E1142 Type 2 diabetes mellitus with diabetic polyneuropathy: Secondary | ICD-10-CM | POA: Diagnosis not present

## 2022-03-27 DIAGNOSIS — N2581 Secondary hyperparathyroidism of renal origin: Secondary | ICD-10-CM

## 2022-03-27 DIAGNOSIS — N1832 Chronic kidney disease, stage 3b: Secondary | ICD-10-CM

## 2022-03-27 DIAGNOSIS — Z0000 Encounter for general adult medical examination without abnormal findings: Secondary | ICD-10-CM | POA: Diagnosis not present

## 2022-03-27 DIAGNOSIS — J45909 Unspecified asthma, uncomplicated: Secondary | ICD-10-CM | POA: Insufficient documentation

## 2022-03-27 DIAGNOSIS — Z1211 Encounter for screening for malignant neoplasm of colon: Secondary | ICD-10-CM

## 2022-03-27 DIAGNOSIS — R972 Elevated prostate specific antigen [PSA]: Secondary | ICD-10-CM

## 2022-03-27 LAB — RENAL FUNCTION PANEL
Albumin: 3 g/dL — ABNORMAL LOW (ref 3.5–5.2)
BUN: 28 mg/dL — ABNORMAL HIGH (ref 6–23)
CO2: 26 mEq/L (ref 19–32)
Calcium: 8.4 mg/dL (ref 8.4–10.5)
Chloride: 102 mEq/L (ref 96–112)
Creatinine, Ser: 1.63 mg/dL — ABNORMAL HIGH (ref 0.40–1.50)
GFR: 41.63 mL/min — ABNORMAL LOW (ref >60.00)
Glucose, Bld: 191 mg/dL — ABNORMAL HIGH (ref 70–99)
Phosphorus: 3.6 mg/dL (ref 2.3–4.6)
Potassium: 3.4 mEq/L — ABNORMAL LOW (ref 3.5–5.1)
Sodium: 137 mEq/L (ref 135–145)

## 2022-03-27 LAB — VITAMIN D 25 HYDROXY (VIT D DEFICIENCY, FRACTURES): VITD: 12.84 ng/mL — ABNORMAL LOW (ref 30.00–100.00)

## 2022-03-27 LAB — MICROALBUMIN / CREATININE URINE RATIO
Creatinine,U: 64 mg/dL
Microalb Creat Ratio: 456.2 mg/g — ABNORMAL HIGH (ref 0.0–30.0)
Microalb, Ur: 291.8 mg/dL — ABNORMAL HIGH (ref 0.0–1.9)

## 2022-03-27 LAB — LIPID PANEL
Cholesterol: 187 mg/dL (ref 0–200)
HDL: 69 mg/dL (ref >39.00)
LDL Cholesterol: 99 mg/dL (ref 0–99)
NonHDL: 117.9
Total CHOL/HDL Ratio: 3
Triglycerides: 97 mg/dL (ref 0.0–149.0)
VLDL: 19.4 mg/dL (ref 0.0–40.0)

## 2022-03-27 LAB — HM DIABETES FOOT EXAM

## 2022-03-27 LAB — HEMOGLOBIN A1C: Hgb A1c MFr Bld: 11 % — ABNORMAL HIGH (ref 4.6–6.5)

## 2022-03-27 LAB — TSH: TSH: 1.36 u[IU]/mL (ref 0.35–5.50)

## 2022-03-27 MED ORDER — DOXYCYCLINE HYCLATE 100 MG PO TABS: 100.0000 mg | ORAL_TABLET | Freq: Two times a day (BID) | ORAL | 0 refills | Status: DC

## 2022-03-27 MED ORDER — PREDNISONE 20 MG PO TABS: 40.0000 mg | ORAL_TABLET | Freq: Every day | ORAL | 0 refills | Status: DC

## 2022-03-27 NOTE — Assessment & Plan Note (Signed)
4.2 last year Will recheck

## 2022-03-27 NOTE — Assessment & Plan Note (Signed)
On calcitriol Will recheck levels
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
## 2022-03-27 NOTE — Assessment & Plan Note (Signed)
Had been poorly controlled but hopefully with improved eating On trulicity '3mg'$  weekly, 50 units of insulin, jardiance 10 Gabapentin for neuropathy

## 2022-03-27 NOTE — Assessment & Plan Note (Signed)
Will need new nephrologist

## 2022-03-27 NOTE — Assessment & Plan Note (Signed)
BMI still 35 with HTN, diabetes, vascular disease Is on trulicity and working on eating better

## 2022-03-27 NOTE — Assessment & Plan Note (Signed)
Compensated on the furosemide 40

## 2022-03-27 NOTE — Assessment & Plan Note (Signed)
Will treat with doxy 100 bid x 7 days and 6 days of prednisone

## 2022-03-27 NOTE — Assessment & Plan Note (Signed)
No recurrence on the eliquis Continues with Dr Tasia Catchings

## 2022-03-27 NOTE — Assessment & Plan Note (Signed)
On imaging Is on atorvastatin 80

## 2022-03-27 NOTE — Progress Notes (Unsigned)
Hearing Screening - Comments:: Passed whisper test Vision Screening - Comments:: July 2023

## 2022-03-27 NOTE — Assessment & Plan Note (Signed)
I have personally reviewed the Medicare Annual Wellness questionnaire and have noted 1. The patient's medical and social history 2. Their use of alcohol, tobacco or illicit drugs 3. Their current medications and supplements 4. The patient's functional ability including ADL's, fall risks, home safety risks and hearing or visual             impairment. 5. Diet and physical activities 6. Evidence for depression or mood disorders  The patients weight, height, BMI and visual acuity have been recorded in the chart I have made referrals, counseling and provided education to the patient based review of the above and I have provided the pt with a written personalized care plan for preventive services.  I have provided you with a copy of your personalized plan for preventive services. Please take the time to review along with your updated medication list.  Recommended updated COVID vaccine at the pharmacy Has been doing exercise Consider Td/shingrix at pharmacy [facility]

## 2022-03-28 ENCOUNTER — Other Ambulatory Visit (INDEPENDENT_AMBULATORY_CARE_PROVIDER_SITE_OTHER): Payer: Medicare PPO | Admitting: Radiology

## 2022-03-28 DIAGNOSIS — Z1211 Encounter for screening for malignant neoplasm of colon: Secondary | ICD-10-CM

## 2022-03-28 LAB — FECAL OCCULT BLOOD, IMMUNOCHEMICAL: Fecal Occult Bld: NEGATIVE

## 2022-03-31 LAB — PARATHYROID HORMONE, INTACT (NO CA): PTH: 99 pg/mL — ABNORMAL HIGH (ref 16–77)

## 2022-03-31 LAB — PSA, TOTAL AND FREE
PSA, % Free: 15 % (calc) — ABNORMAL LOW (ref >25)
PSA, Free: 0.6 ng/mL
PSA, Total: 3.9 ng/mL (ref <=4.0)

## 2022-04-01 ENCOUNTER — Other Ambulatory Visit: Payer: Self-pay

## 2022-04-01 MED ORDER — EMPAGLIFLOZIN 25 MG PO TABS: 25.0000 mg | ORAL_TABLET | Freq: Every day | ORAL | 11 refills | Status: DC

## 2022-04-03 ENCOUNTER — Emergency Department: Payer: Medicare PPO

## 2022-04-03 ENCOUNTER — Emergency Department
Admission: EM | Admit: 2022-04-03 | Discharge: 2022-04-03 | Disposition: A | Discharge status: Home / Self Care | Payer: Medicare PPO | Attending: Emergency Medicine | Admitting: Emergency Medicine

## 2022-04-03 ENCOUNTER — Other Ambulatory Visit: Payer: Self-pay

## 2022-04-03 ENCOUNTER — Telehealth: Payer: Self-pay | Admitting: Internal Medicine

## 2022-04-03 DIAGNOSIS — R0902 Hypoxemia: Secondary | ICD-10-CM | POA: Diagnosis not present

## 2022-04-03 DIAGNOSIS — Z7901 Long term (current) use of anticoagulants: Secondary | ICD-10-CM | POA: Insufficient documentation

## 2022-04-03 DIAGNOSIS — R739 Hyperglycemia, unspecified: Secondary | ICD-10-CM | POA: Diagnosis not present

## 2022-04-03 DIAGNOSIS — E1122 Type 2 diabetes mellitus with diabetic chronic kidney disease: Secondary | ICD-10-CM | POA: Diagnosis not present

## 2022-04-03 DIAGNOSIS — B338 Other specified viral diseases: Secondary | ICD-10-CM

## 2022-04-03 DIAGNOSIS — R531 Weakness: Secondary | ICD-10-CM | POA: Diagnosis not present

## 2022-04-03 DIAGNOSIS — B974 Respiratory syncytial virus as the cause of diseases classified elsewhere: Secondary | ICD-10-CM | POA: Insufficient documentation

## 2022-04-03 DIAGNOSIS — Z1152 Encounter for screening for COVID-19: Secondary | ICD-10-CM | POA: Diagnosis not present

## 2022-04-03 DIAGNOSIS — E876 Hypokalemia: Secondary | ICD-10-CM | POA: Insufficient documentation

## 2022-04-03 DIAGNOSIS — R42 Dizziness and giddiness: Secondary | ICD-10-CM | POA: Diagnosis not present

## 2022-04-03 DIAGNOSIS — I130 Hypertensive heart and chronic kidney disease with heart failure and stage 1 through stage 4 chronic kidney disease, or unspecified chronic kidney disease: Secondary | ICD-10-CM | POA: Diagnosis not present

## 2022-04-03 DIAGNOSIS — I509 Heart failure, unspecified: Secondary | ICD-10-CM | POA: Insufficient documentation

## 2022-04-03 DIAGNOSIS — N189 Chronic kidney disease, unspecified: Secondary | ICD-10-CM | POA: Diagnosis not present

## 2022-04-03 DIAGNOSIS — K5900 Constipation, unspecified: Secondary | ICD-10-CM | POA: Diagnosis not present

## 2022-04-03 LAB — CBC WITH DIFFERENTIAL/PLATELET
Abs Immature Granulocytes: 0.02 10*3/uL (ref 0.00–0.07)
Basophils Absolute: 0 10*3/uL (ref 0.0–0.1)
Basophils Relative: 0 %
Eosinophils Absolute: 0.1 10*3/uL (ref 0.0–0.5)
Eosinophils Relative: 1 %
HCT: 41.4 % (ref 39.0–52.0)
Hemoglobin: 13 g/dL (ref 13.0–17.0)
Immature Granulocytes: 0 %
Lymphocytes Relative: 36 %
Lymphs Abs: 3.4 10*3/uL (ref 0.7–4.0)
MCH: 26.9 pg (ref 26.0–34.0)
MCHC: 31.4 g/dL (ref 30.0–36.0)
MCV: 85.7 fL (ref 80.0–100.0)
Monocytes Absolute: 0.6 10*3/uL (ref 0.1–1.0)
Monocytes Relative: 6 %
Neutro Abs: 5.4 10*3/uL (ref 1.7–7.7)
Neutrophils Relative %: 57 %
Platelets: 191 10*3/uL (ref 150–400)
RBC: 4.83 MIL/uL (ref 4.22–5.81)
RDW: 17.2 % — ABNORMAL HIGH (ref 11.5–15.5)
WBC: 9.6 10*3/uL (ref 4.0–10.5)
nRBC: 0 % (ref 0.0–0.2)

## 2022-04-03 LAB — URINALYSIS, ROUTINE W REFLEX MICROSCOPIC
Bacteria, UA: NONE SEEN
Bilirubin Urine: NEGATIVE
Glucose, UA: >=500 mg/dL — AB
Ketones, ur: NEGATIVE mg/dL
Leukocytes,Ua: NEGATIVE
Nitrite: NEGATIVE
Protein, ur: >=300 mg/dL — AB
Specific Gravity, Urine: 1.021 (ref 1.005–1.030)
Squamous Epithelial / HPF: NONE SEEN /HPF (ref 0–5)
pH: 6 (ref 5.0–8.0)

## 2022-04-03 LAB — RESP PANEL BY RT-PCR (RSV, FLU A&B, COVID)  RVPGX2
Influenza A by PCR: NEGATIVE
Influenza B by PCR: NEGATIVE
Resp Syncytial Virus by PCR: POSITIVE — AB
SARS Coronavirus 2 by RT PCR: NEGATIVE

## 2022-04-03 LAB — COMPREHENSIVE METABOLIC PANEL
ALT: 25 U/L (ref 0–44)
AST: 16 U/L (ref 15–41)
Albumin: 2.6 g/dL — ABNORMAL LOW (ref 3.5–5.0)
Alkaline Phosphatase: 83 U/L (ref 38–126)
Anion gap: 10 (ref 5–15)
BUN: 30 mg/dL — ABNORMAL HIGH (ref 8–23)
CO2: 23 mmol/L (ref 22–32)
Calcium: 8.7 mg/dL — ABNORMAL LOW (ref 8.9–10.3)
Chloride: 103 mmol/L (ref 98–111)
Creatinine, Ser: 1.88 mg/dL — ABNORMAL HIGH (ref 0.61–1.24)
GFR, Estimated: 37 mL/min — ABNORMAL LOW (ref >60)
Glucose, Bld: 257 mg/dL — ABNORMAL HIGH (ref 70–99)
Potassium: 3.3 mmol/L — ABNORMAL LOW (ref 3.5–5.1)
Sodium: 136 mmol/L (ref 135–145)
Total Bilirubin: 0.6 mg/dL (ref 0.3–1.2)
Total Protein: 7 g/dL (ref 6.5–8.1)

## 2022-04-03 LAB — TROPONIN I (HIGH SENSITIVITY): Troponin I (High Sensitivity): 14 ng/L (ref <18)

## 2022-04-03 LAB — CBG MONITORING, ED: Glucose-Capillary: 256 mg/dL — ABNORMAL HIGH (ref 70–99)

## 2022-04-03 MED ORDER — POTASSIUM CHLORIDE CRYS ER 20 MEQ PO TBCR
40.0000 meq | EXTENDED_RELEASE_TABLET | Freq: Once | ORAL | Status: AC
  Administered 2022-04-03: 40 meq via ORAL
  Filled 2022-04-03: qty 2

## 2022-04-03 NOTE — Telephone Encounter (Signed)
Patients daughter called to let Dr Silvio Pate know that her dad had to be transported to the hospital by ambulance. His b/p was elevated,and he was very dizzy while standing. He had anal pressure,with cold sweats and chills,as well as shortness of breathe.

## 2022-04-03 NOTE — Telephone Encounter (Signed)
Hasn't gotten there yet (or not checked in) Will await the ER evaluation and make appropriate follow up plans based on that

## 2022-04-03 NOTE — ED Provider Notes (Signed)
[facility] Provider Note    Event Date/Time   First MD Initiated Contact with Patient 04/03/22 1743     (approximate)   History   Chief Complaint Weakness   HPI  Adam [name] is a 72 y.o. male with past medical history of hypertension, diabetes, CHF, CKD, anemia, DVT, and gout who presents to the ED complaining of weakness.  Patient reports that he began feeling weak this afternoon, especially when he stood up to walk out of the bathroom.  He states that this time he began to feel very lightheaded and like might pass out.  He ended up falling to the side and leaning against the wall, but never fully lost consciousness and never fell completely to the ground.  He denies hitting his head and denies any areas of pain.  He has not had any fevers, cough, chest pain, shortness of breath, nausea, vomiting, or diarrhea.  He denies any dysuria, hematuria, or flank pain.  He does report taking a course of prednisone for gout flare recently, has had significant improvement in pain and swelling to his right wrist.     Physical Exam   Triage Vital Signs: ED Triage Vitals  Enc Vitals Group     BP 04/03/22 1747 (!) 154/94     Pulse Rate 04/03/22 1747 (!) 111     Resp 04/03/22 1747 15     Temp 04/03/22 1747 98.6 F (37 C)     Temp src --      SpO2 04/03/22 1747 99 %     Weight --      Height --      Head Circumference --      Peak Flow --      Pain Score 04/03/22 1745 0     Pain Loc --      Pain Edu? --      Excl. in [place]? --     Most recent vital signs: Vitals:   04/03/22 2215 04/03/22 2238  BP:    Pulse: 92 93  Resp: 14 16  Temp:    SpO2: 100% 97%    Constitutional: Alert and oriented. Eyes: Conjunctivae are normal. Head: Atraumatic. Nose: No congestion/rhinnorhea. Mouth/Throat: Mucous membranes are moist.  Neck: No midline cervical spine tenderness to palpation. Cardiovascular: Normal rate, regular rhythm. Grossly normal heart sounds.  2+  radial pulses bilaterally. Respiratory: Normal respiratory effort.  No retractions. Lungs CTAB. Gastrointestinal: Soft and nontender. No distention. Musculoskeletal: No lower extremity tenderness, 1+ pitting edema to ankles bilaterally. Neurologic:  Normal speech and language. No gross focal neurologic deficits are appreciated.    ED Results / Procedures / Treatments   Labs (all labs ordered are listed, but only abnormal results are displayed) Labs Reviewed  RESP PANEL BY RT-PCR (RSV, FLU A&B, COVID)  RVPGX2 - Abnormal; Notable for the following components:      Result Value   Resp Syncytial Virus by PCR POSITIVE (*)    All other components within normal limits  URINALYSIS, ROUTINE W REFLEX MICROSCOPIC - Abnormal; Notable for the following components:   Color, Urine YELLOW (*)    APPearance CLEAR (*)    Glucose, UA >=500 (*)    Hgb urine dipstick SMALL (*)    Protein, ur >=300 (*)    All other components within normal limits  CBC WITH DIFFERENTIAL/PLATELET - Abnormal; Notable for the following components:   RDW 17.2 (*)    All other components within normal limits  COMPREHENSIVE  METABOLIC PANEL - Abnormal; Notable for the following components:   Potassium 3.3 (*)    Glucose, Bld 257 (*)    BUN 30 (*)    Creatinine, Ser 1.88 (*)    Calcium 8.7 (*)    Albumin 2.6 (*)    GFR, Estimated 37 (*)    All other components within normal limits  CBG MONITORING, ED - Abnormal; Notable for the following components:   Glucose-Capillary 256 (*)    All other components within normal limits  TROPONIN I (HIGH SENSITIVITY)     EKG  ED ECG REPORT I, Blake Divine, the attending physician, personally viewed and interpreted this ECG.   Date: 04/03/2022  EKG Time: 17:48  Rate: 103  Rhythm: sinus tachycardia  Axis: LAD  Intervals:left anterior fascicular block  ST&T Change: None  RADIOLOGY Chest x-ray reviewed and interpreted by me with no infiltrate, edema, or  effusion.  PROCEDURES:  ------------------------------------------------------------------------------------------------------------------- Fecal Disimpaction Procedure Note:  Performed by me:  Patient placed in the lateral recumbent position with knees drawn towards chest. Nurse present for patient support. Large amount of hard brown stool removed. No complications during procedure.   ------------------------------------------------------------------------------------------------------------------   Critical Care performed: No  Procedures   MEDICATIONS ORDERED IN ED: Medications  potassium chloride SA (KLOR-CON M) CR tablet 40 mEq (40 mEq Oral Given 04/03/22 2159)     IMPRESSION / MDM / ASSESSMENT AND PLAN / ED COURSE  I reviewed the triage vital signs and the nursing notes.                              72 y.o. male with past medical history of hypertension, diabetes, DVT on Eliquis, CHF, CKD, anemia, and gout who presents to the ED with generalized weakness starting this afternoon with dizziness and lightheadedness causing him to almost fall and pass out.  Patient's presentation is most consistent with acute presentation with potential threat to life or bodily function.  Differential diagnosis includes, but is not limited to, arrhythmia, ACS, pneumonia, anemia, electrolyte abnormality, AKI, UTI.  Patient chronically ill but nontoxic-appearing and in no acute distress, vital signs are unremarkable.  No signs of significant trauma to his head or neck and he has no focal neurologic deficits on exam to suggest stroke.  EKG shows no evidence of arrhythmia or ischemia, we will observe on cardiac monitor and screen troponin along with additional labs.  We will also check chest x-ray and urinalysis for evidence of infectious process.  Labs are reassuring with mild hypokalemia that was repleted, chronic kidney disease stable compared to previous.  No significant anemia or leukocytosis  noted, troponin within normal limits.  Patient did test positive for RSV and I suspect this is the source of his generalized weakness, but he is not in any respiratory distress and maintaining oxygen saturations on room air.  Urinalysis shows no signs of infection and patient is appropriate for discharge home.  Patient and daughter counseled to follow-up with his PCP and to return to the ED for new or worsening symptoms, patient and family agree with plan.      FINAL CLINICAL IMPRESSION(S) / ED DIAGNOSES   Final diagnoses:  Generalized weakness  RSV (respiratory syncytial virus infection)     Rx / DC Orders   ED Discharge Orders     None        Note:  This document was prepared using Dragon voice recognition software and may include  unintentional dictation errors.   Blake Divine, MD 04/03/22 2322

## 2022-04-03 NOTE — ED Triage Notes (Addendum)
Pt comes via EMS with c/o constipation for 3 weeks and weakness that started today. Pt was walking and fell against wall. Pt denies any loc or hitting head. Pt stats he has been sleepy today. Pt has been on presidone for about 2 weeks for his gout.   CBg in 300s  MD at bedside

## 2022-04-09 ENCOUNTER — Encounter: Payer: Self-pay | Admitting: Internal Medicine

## 2022-04-09 ENCOUNTER — Ambulatory Visit: Payer: Medicare PPO | Admitting: Internal Medicine

## 2022-04-09 VITALS — BP 132/84 | HR 92 | Temp 97.3°F | Ht 71.0 in | Wt 252.0 lb

## 2022-04-09 DIAGNOSIS — R55 Syncope and collapse: Secondary | ICD-10-CM | POA: Diagnosis not present

## 2022-04-09 DIAGNOSIS — E1142 Type 2 diabetes mellitus with diabetic polyneuropathy: Secondary | ICD-10-CM

## 2022-04-09 DIAGNOSIS — N1832 Chronic kidney disease, stage 3b: Secondary | ICD-10-CM

## 2022-04-09 DIAGNOSIS — I5032 Chronic diastolic (congestive) heart failure: Secondary | ICD-10-CM | POA: Diagnosis not present

## 2022-04-09 DIAGNOSIS — B338 Other specified viral diseases: Secondary | ICD-10-CM | POA: Diagnosis not present
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
## 2022-04-09 NOTE — Assessment & Plan Note (Signed)
Lab Results  Component Value Date   HGBA1C 11.0 (H) 03/27/2022   Hopefuly will be getting this under better control  Lantus 50, trulicity 12m weekly, jardiance increased to 25

## 2022-04-09 NOTE — Assessment & Plan Note (Signed)
Had been ongoing and now finally improving some Positive test in the ER--but a while from when symptoms started

## 2022-04-09 NOTE — Assessment & Plan Note (Signed)
May have increased fluid with the prednisone Off now--and uses the furosemide for edema or increased weight

## 2022-04-09 NOTE — Assessment & Plan Note (Signed)
Prompted the ER visit After trying to push out obstipated stool May have had some degree of vagal spell Also might have had mild dehydration with sugars high

## 2022-04-09 NOTE — Assessment & Plan Note (Signed)
Has appt with DR Lanora Manis

## 2022-04-14 ENCOUNTER — Telehealth: Payer: Self-pay | Admitting: Internal Medicine

## 2022-04-14 ENCOUNTER — Other Ambulatory Visit: Payer: Self-pay | Admitting: Internal Medicine

## 2022-04-14 NOTE — Telephone Encounter (Signed)
Form placed in [place] for review and completion

## 2022-04-14 NOTE — Telephone Encounter (Signed)
Patient dropped off document to be filled out by provider  prescription assistance resources . Patient requested to send it via Fax within ASAP. Document is located in providers tray at front office.

## 2022-04-15 NOTE — Telephone Encounter (Signed)
I believe [place] is state [place] number

## 2022-04-17 ENCOUNTER — Telehealth: Payer: Self-pay | Admitting: Internal Medicine

## 2022-04-17 NOTE — Telephone Encounter (Signed)
Spoke to  pt's wife .

## 2022-04-17 NOTE — Telephone Encounter (Signed)
Pt's wife, [name], called stating the pt saw Dr. Silvio Pate on 2/14 for a hosp f/u & the pt's feet are still swollen. [name] is asking if something can be done about the swelling, does the pt need another appt or can meds be prescribed? Call back # [phone]

## 2022-04-17 NOTE — Telephone Encounter (Signed)
Pts wife says he is taking 66m in am and 255min pm per his nephrologist. Wife said he is taking it daily because she does his pills for him.

## 2022-04-17 NOTE — Telephone Encounter (Signed)
Form faxed to [facility] Pt assistance. Papers at my desk for now.

## 2022-04-29 ENCOUNTER — Telehealth: Payer: Self-pay | Admitting: Internal Medicine

## 2022-04-29 NOTE — Telephone Encounter (Signed)
Patient assistant foundation called asking Adam [name] where the pt would be taking the meds, Eliquis in a facility or at home with family? Call back # [phone]

## 2022-04-29 NOTE — Telephone Encounter (Signed)
Called and provided information.

## 2022-05-08 ENCOUNTER — Encounter: Payer: Self-pay | Admitting: Cardiovascular Disease

## 2022-05-08 ENCOUNTER — Ambulatory Visit: Payer: Medicare PPO | Attending: Cardiovascular Disease | Admitting: Cardiovascular Disease

## 2022-05-08 VITALS — BP 140/80 | HR 79 | Ht 71.0 in | Wt 249.5 lb

## 2022-05-08 DIAGNOSIS — I77810 Thoracic aortic ectasia: Secondary | ICD-10-CM | POA: Diagnosis not present

## 2022-05-08 DIAGNOSIS — I5032 Chronic diastolic (congestive) heart failure: Secondary | ICD-10-CM

## 2022-05-08 DIAGNOSIS — E785 Hyperlipidemia, unspecified: Secondary | ICD-10-CM | POA: Diagnosis not present

## 2022-05-08 DIAGNOSIS — Z86718 Personal history of other venous thrombosis and embolism: Secondary | ICD-10-CM | POA: Diagnosis not present

## 2022-05-08 MED ORDER — APIXABAN 2.5 MG PO TABS: 2.5000 mg | ORAL_TABLET | Freq: Two times a day (BID) | ORAL | 1 refills | Status: DC

## 2022-05-08 NOTE — Patient Instructions (Signed)
Medication Instructions:  DECREASE the Eliquis to 2.5 mg twice daily  *If you need a refill on your cardiac medications before your next appointment, please call your pharmacy*   Lab Work: None ordered If you have labs (blood work) drawn today and your tests are completely normal, you will receive your results only by: [place] (if you have MyChart) OR A paper copy in the mail If you have any lab test that is abnormal or we need to change your treatment, we will call you to review the results.   Testing/Procedures: Your physician has requested that you have an echocardiogram in 3 months. Echocardiography is a painless test that uses sound waves to create images of your heart. It provides your doctor with information about the size and shape of your heart and how well your heart's chambers and valves are working.   You may receive an ultrasound enhancing agent through an IV if needed to better visualize your heart during the echo. This procedure takes approximately one hour.  There are no restrictions for this procedure.  This will take place at [facility] [id number], [place]    Follow-Up: At [facility], you and your health needs are our priority.  As part of our continuing mission to provide you with exceptional heart care, we have created designated Provider Care Teams.  These Care Teams include your primary Cardiologist (physician) and Advanced Practice Providers (APPs -  Physician Assistants and Nurse Practitioners) who all work together to provide you with the care you need, when you need it.  We recommend signing up for the patient portal called "MyChart".  Sign up information is provided on this After Visit Summary.  MyChart is used to connect with patients for Virtual Visits (Telemedicine).  Patients are able to view lab/test results, encounter notes, upcoming appointments, etc.  Non-urgent messages can be sent to your provider  as well.   To learn more about what you can do with MyChart, go to [email or web address].    Your next appointment:   6 month(s)  Provider:   You may see Dr. Fletcher Anon or one of the following Advanced Practice Providers on your designated Care Team:   Murray Hodgkins, NP Christell Faith, PA-C Cadence Kathlen Mody, PA-C Gerrie Nordmann, NP
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
## 2022-05-16 ENCOUNTER — Other Ambulatory Visit
Admission: RE | Admit: 2022-05-16 | Discharge: 2022-05-16 | Disposition: A | Discharge status: Home / Self Care | Payer: Medicare PPO | Attending: Nephrology | Admitting: Nephrology

## 2022-05-16 DIAGNOSIS — E113493 Type 2 diabetes mellitus with severe nonproliferative diabetic retinopathy without macular edema, bilateral: Secondary | ICD-10-CM | POA: Diagnosis not present

## 2022-05-16 DIAGNOSIS — N1832 Chronic kidney disease, stage 3b: Secondary | ICD-10-CM | POA: Insufficient documentation

## 2022-05-16 DIAGNOSIS — D631 Anemia in chronic kidney disease: Secondary | ICD-10-CM | POA: Insufficient documentation

## 2022-05-16 DIAGNOSIS — N2581 Secondary hyperparathyroidism of renal origin: Secondary | ICD-10-CM | POA: Diagnosis not present

## 2022-05-16 DIAGNOSIS — I10 Essential (primary) hypertension: Secondary | ICD-10-CM | POA: Insufficient documentation

## 2022-05-16 DIAGNOSIS — N2889 Other specified disorders of kidney and ureter: Secondary | ICD-10-CM | POA: Insufficient documentation

## 2022-05-16 DIAGNOSIS — I130 Hypertensive heart and chronic kidney disease with heart failure and stage 1 through stage 4 chronic kidney disease, or unspecified chronic kidney disease: Secondary | ICD-10-CM | POA: Insufficient documentation

## 2022-05-16 DIAGNOSIS — I509 Heart failure, unspecified: Secondary | ICD-10-CM | POA: Diagnosis not present

## 2022-05-16 DIAGNOSIS — E1122 Type 2 diabetes mellitus with diabetic chronic kidney disease: Secondary | ICD-10-CM | POA: Insufficient documentation

## 2022-05-16 DIAGNOSIS — E663 Overweight: Secondary | ICD-10-CM | POA: Diagnosis not present

## 2022-05-16 DIAGNOSIS — R809 Proteinuria, unspecified: Secondary | ICD-10-CM | POA: Insufficient documentation

## 2022-05-16 DIAGNOSIS — E162 Hypoglycemia, unspecified: Secondary | ICD-10-CM | POA: Diagnosis not present

## 2022-05-16 DIAGNOSIS — N281 Cyst of kidney, acquired: Secondary | ICD-10-CM | POA: Insufficient documentation

## 2022-05-16 DIAGNOSIS — E11649 Type 2 diabetes mellitus with hypoglycemia without coma: Secondary | ICD-10-CM | POA: Insufficient documentation

## 2022-05-16 DIAGNOSIS — E161 Other hypoglycemia: Secondary | ICD-10-CM | POA: Diagnosis not present

## 2022-05-16 DIAGNOSIS — I82402 Acute embolism and thrombosis of unspecified deep veins of left lower extremity: Secondary | ICD-10-CM | POA: Diagnosis not present

## 2022-05-16 LAB — URINALYSIS, COMPLETE (UACMP) WITH MICROSCOPIC
Bacteria, UA: NONE SEEN
Bilirubin Urine: NEGATIVE
Glucose, UA: >=500 mg/dL — AB
Ketones, ur: NEGATIVE mg/dL
Leukocytes,Ua: NEGATIVE
Nitrite: NEGATIVE
Protein, ur: >=300 mg/dL — AB
Specific Gravity, Urine: 1.011 (ref 1.005–1.030)
Squamous Epithelial / HPF: NONE SEEN /HPF (ref 0–5)
pH: 6 (ref 5.0–8.0)

## 2022-05-16 LAB — CBC WITH DIFFERENTIAL/PLATELET
Abs Immature Granulocytes: 0.01 10*3/uL (ref 0.00–0.07)
Basophils Absolute: 0.1 10*3/uL (ref 0.0–0.1)
Basophils Relative: 1 %
Eosinophils Absolute: 0.3 10*3/uL (ref 0.0–0.5)
Eosinophils Relative: 6 %
HCT: 36.2 % — ABNORMAL LOW (ref 39.0–52.0)
Hemoglobin: 11.3 g/dL — ABNORMAL LOW (ref 13.0–17.0)
Immature Granulocytes: 0 %
Lymphocytes Relative: 60 %
Lymphs Abs: 3.7 10*3/uL (ref 0.7–4.0)
MCH: 27.1 pg (ref 26.0–34.0)
MCHC: 31.2 g/dL (ref 30.0–36.0)
MCV: 86.8 fL (ref 80.0–100.0)
Monocytes Absolute: 0.5 10*3/uL (ref 0.1–1.0)
Monocytes Relative: 9 %
Neutro Abs: 1.5 10*3/uL — ABNORMAL LOW (ref 1.7–7.7)
Neutrophils Relative %: 24 %
Platelets: 199 10*3/uL (ref 150–400)
RBC: 4.17 MIL/uL — ABNORMAL LOW (ref 4.22–5.81)
RDW: 16.2 % — ABNORMAL HIGH (ref 11.5–15.5)
WBC: 6.1 10*3/uL (ref 4.0–10.5)
nRBC: 0 % (ref 0.0–0.2)

## 2022-05-16 LAB — RENAL FUNCTION PANEL
Albumin: 3.2 g/dL — ABNORMAL LOW (ref 3.5–5.0)
Anion gap: 7 (ref 5–15)
BUN: 20 mg/dL (ref 8–23)
CO2: 26 mmol/L (ref 22–32)
Calcium: 9.2 mg/dL (ref 8.9–10.3)
Chloride: 108 mmol/L (ref 98–111)
Creatinine, Ser: 1.72 mg/dL — ABNORMAL HIGH (ref 0.61–1.24)
GFR, Estimated: 41 mL/min — ABNORMAL LOW (ref >60)
Glucose, Bld: 116 mg/dL — ABNORMAL HIGH (ref 70–99)
Phosphorus: 3.3 mg/dL (ref 2.5–4.6)
Potassium: 3.4 mmol/L — ABNORMAL LOW (ref 3.5–5.1)
Sodium: 141 mmol/L (ref 135–145)

## 2022-05-16 LAB — PROTEIN / CREATININE RATIO, URINE
Creatinine, Urine: 63 mg/dL
Protein Creatinine Ratio: 3.27 mg/mg{Cre} — ABNORMAL HIGH (ref 0.00–0.15)
Total Protein, Urine: 206 mg/dL

## 2022-05-18 LAB — PARATHYROID HORMONE, INTACT (NO CA): PTH: 37 pg/mL (ref 15–65)

## 2022-05-19 DIAGNOSIS — H4321 Crystalline deposits in vitreous body, right eye: Secondary | ICD-10-CM | POA: Diagnosis not present

## 2022-05-19 DIAGNOSIS — H43813 Vitreous degeneration, bilateral: Secondary | ICD-10-CM | POA: Diagnosis not present

## 2022-05-19 DIAGNOSIS — E113513 Type 2 diabetes mellitus with proliferative diabetic retinopathy with macular edema, bilateral: Secondary | ICD-10-CM | POA: Diagnosis not present

## 2022-05-19 DIAGNOSIS — H20013 Primary iridocyclitis, bilateral: Secondary | ICD-10-CM | POA: Diagnosis not present

## 2022-05-19 DIAGNOSIS — H40053 Ocular hypertension, bilateral: Secondary | ICD-10-CM | POA: Diagnosis not present

## 2022-05-20 DIAGNOSIS — I82402 Acute embolism and thrombosis of unspecified deep veins of left lower extremity: Secondary | ICD-10-CM | POA: Diagnosis not present

## 2022-05-20 DIAGNOSIS — D631 Anemia in chronic kidney disease: Secondary | ICD-10-CM | POA: Diagnosis not present

## 2022-05-20 DIAGNOSIS — I10 Essential (primary) hypertension: Secondary | ICD-10-CM | POA: Diagnosis not present

## 2022-05-20 DIAGNOSIS — N1832 Chronic kidney disease, stage 3b: Secondary | ICD-10-CM | POA: Diagnosis not present

## 2022-05-20 DIAGNOSIS — E1122 Type 2 diabetes mellitus with diabetic chronic kidney disease: Secondary | ICD-10-CM | POA: Diagnosis not present

## 2022-05-20 DIAGNOSIS — R809 Proteinuria, unspecified: Secondary | ICD-10-CM | POA: Diagnosis not present

## 2022-05-20 DIAGNOSIS — N281 Cyst of kidney, acquired: Secondary | ICD-10-CM | POA: Diagnosis not present

## 2022-05-20 DIAGNOSIS — I509 Heart failure, unspecified: Secondary | ICD-10-CM | POA: Diagnosis not present

## 2022-05-20 DIAGNOSIS — N2581 Secondary hyperparathyroidism of renal origin: Secondary | ICD-10-CM | POA: Diagnosis not present

## 2022-05-28 ENCOUNTER — Other Ambulatory Visit: Payer: Self-pay | Admitting: Internal Medicine

## 2022-06-02 ENCOUNTER — Telehealth: Payer: Self-pay

## 2022-06-02 ENCOUNTER — Telehealth: Payer: Self-pay | Admitting: Pharmacist

## 2022-06-02 DIAGNOSIS — I5032 Chronic diastolic (congestive) heart failure: Secondary | ICD-10-CM

## 2022-06-02 DIAGNOSIS — I10 Essential (primary) hypertension: Secondary | ICD-10-CM

## 2022-06-02 DIAGNOSIS — E1142 Type 2 diabetes mellitus with diabetic polyneuropathy: Secondary | ICD-10-CM

## 2022-06-02 NOTE — Progress Notes (Unsigned)
Care Management & Coordination Services Pharmacy Team  Reason for Encounter: Initial Appointment Reminder  Contacted patient to confirm in office appointment with Al Corpus, PharmD on 06/04/22 at 11:00.  {US HC Outreach:[id number]}  Do you have any problems getting your medications? {yes/no:20286} If yes what types of problems are you experiencing? {Problems:27223}  What is your top health concern you would like to discuss at your upcoming visit?   Have you seen any other providers since your last visit with PCP? Yes  Chart review:  Recent office visits:  04/17/22 Tillman Abide, MD Telephone: Furosemide 40 mg every day for the next week or until the feet are down.  04/09/22 Tillman Abide, MD RSV Stop: Prednisone 40 mg 03/27/22 Tillman Abide, MD AWV A1c - 11.0 Change: Jardiance 25 mg Start: Doxycycline Hyclate 100 mg Change: Prednisone 40 mg Stop: (completed) Oxycodone-Acetaminophen 5-325 mg 01/16/23 Tillman Abide, MD Idiopathic gout Start: Colchicine 0.6 mg Change: Prednisone 40 mg  12/25/21 Tillman Abide, MD Influenza Vaccination Administered: Fluad Quad  Change: Insulin Glargine 50 units vs. 40 units A1c - 11.9 F/U 3 months  Recent consult visits:  05/20/22 [name], MD Anemia in chronic kidney disease Start: OTC [place] Tablets F/U 4 months 05/19/22 Earnest Conroy, MD (Ophthalmology) Diabetic retinopathy of both eyes F/U 4 months 05/08/22 Lorine Bears, MD (Cardiology) Chronic diastolic heart failure Ordered: EKG and Echo Change: Apixaban 2.5 mg Stop (completed): Gabapentin 100 mg F/U 6 months 01/20/22 Earnest Conroy, MD (Ophthalmology) Proliferative diabetic retinopathy of both eyes Start: Prednisolone acetate 1% F/U 4 months 01/06/22 [name], MD  (Oncology) "recommend him to decrease Eliquis 2.5mg  BID" 12/24/21 MR Brain 12/10/21 Jennet Maduro, PA (Ortho) Radial styloid tenosynovitis No other information  Hospital visits:  Medication Reconciliation was completed  by comparing discharge summary, patient's EMR and Pharmacy list, and upon discussion with patient.  Admitted to the hospital on 04/03/22 due to generalized weakness. Discharge date was 04/03/22. Discharged from [facility].    72 y.o. male with past medical history of hypertension, diabetes, DVT on Eliquis, CHF, CKD, anemia, and gout who presents to the ED with generalized weakness starting this afternoon with dizziness and lightheadedness causing him to almost fall and pass out.   Patient's presentation is most consistent with acute presentation with potential threat to life or bodily function.   Differential diagnosis includes, but is not limited to, arrhythmia, ACS, pneumonia, anemia, electrolyte abnormality, AKI, UTI.   Patient chronically ill but nontoxic-appearing and in no acute distress, vital signs are unremarkable.  No signs of significant trauma to his head or neck and he has no focal neurologic deficits on exam to suggest stroke.  EKG shows no evidence of arrhythmia or ischemia, we will observe on cardiac monitor and screen troponin along with additional labs.  We will also check chest x-ray and urinalysis for evidence of infectious process.   Labs are reassuring with mild hypokalemia that was repleted, chronic kidney disease stable compared to previous.  No significant anemia or leukocytosis noted, troponin within normal limits.  Patient did test positive for RSV and I suspect this is the source of his generalized weakness, but he is not in any respiratory distress and maintaining oxygen saturations on room air.  Urinalysis shows no signs of infection and patient is appropriate for discharge home.  Patient and daughter counseled to follow-up with his PCP and to return to the ED for new or worsening symptoms, patient and family agree with plan.   Medications that remain the same  after Hospital Discharge:??  -All other medications will remain the same.    Medication  Reconciliation was completed by comparing discharge summary, patient's EMR and Pharmacy list, and upon discussion with patient.  Admitted to the ED on 03/09/22 due to Acute gout of right wrist. Discharge date was 03/09/22. Discharged from [facility].    Patient presents with right wrist/hand pain x 1 week, as well as progressive bilateral lower extremity edema.  In regards to his right upper extremity pain, I suspect this is likely a gout flare.  He states that he has responded well to prednisone treatment in the past.  Will provide.  Advised him to closely monitor his blood sugar throughout this treatment.  Very low suspicion for infectious etiologies given unremarkable labs and the clinical presentation.  In regards to his bilateral lower extremity edema, I suspect this is likely related to his chronic heart failure, though he appears well clinically.  BMP is unremarkable.  His chest x-ray shows no evidence of effusions.  He is currently on furosemide.  Encouraged him to continue taking this and to trial compression stockings as well.  Recommend they follow-up with his primary care provider within the next week for reevaluation.  He was amenable to this.  Will provide him with prednisone and oxycodone acetaminophen to help manage his symptoms.  Spoke with Dr. Larinda Buttery about the plan for the patient, who agrees.  Will discharge.   New?Medications Started at [facility] Discharge:?? PERCOCET/TROXICET 5-325 MG tablet predniSONE 10 MG table     -All other medications will remain the same.    Star Rating Drugs:  Medication:  Last Fill: Day Supply Atorvastatin 80 mg 03/31/22 90 Jardiance 25 mg 04/27/22 30 Trulicity 3 mg  04/15/22 84  Care Gaps: Annual wellness visit in last year? Yes 03/27/22  If Diabetic: Last eye exam / retinopathy screening: Up to date Last diabetic foot exam: Up to date  Al Corpus, PharmD notified  Claudina Lick, [place] [facility]  Assistant [phone]

## 2022-06-02 NOTE — Telephone Encounter (Signed)
PharmD reviewed patient chart to assess eligibility for [facility] services. Patient was determined to be a good candidate for the program given the complexity of the medication regimen and overall risk for hospitalization and/or high healthcare utilization.   Referral entered in order to outreach patient and offer appointment with PharmD. Referral order cosigned to PCP.

## 2022-06-04 ENCOUNTER — Ambulatory Visit: Payer: Medicare PPO | Admitting: Pharmacist

## 2022-06-04 NOTE — Progress Notes (Signed)
Care Management & Coordination Services Pharmacy Note  06/04/2022 Name:  Adam [name] MRN:  [id number] DOB:  10-Jul-1950  Summary: Initial OV -DM: A1c 11.0% (03/2022), Jardiance dose increased to 25 mg at that time; fasting glucose range 90-139 over the last 2 weeks suggests significant improvement -HF: pt is taking furosemide 40 mg BID, swelling is controlled with this; he is taking PM dose at bedtime and urinating frequently throughout the night -Gout: pt reports he has a gout flare every 1-2 months, treated effectively with colchicine; reviewed low-purine diet; pt may also benefit from chronic urate-lowering therapy -GERD: pt takes omeprazole 20 mg daily for years; pt denies daily symptoms -Vitamin D deficiency: Vit D level 12 (03/2022), pt is taking 1000 IU of vitamin D daily  Recommendations/Changes made from today's visit: -Move PM dose of furosemide to 3-4 pm to prevent nocturia -Recommend allopurinol 50 mg daily -Trial PPI taper: omeprazole every other day, monitor reflux -Increase Vitamin D to 5000 IU daily  Follow up plan: -Pharmacist follow up televisit scheduled for 1 month -PCP appt 06/25/22; Hematology appt 07/07/22; Cardiology appt 11/13/22    Subjective: CRISANTO [name] is an 72 y.o. year old male who is a primary patient of [name], MD.  The care coordination team was consulted for assistance with disease management and care coordination needs.    Engaged with patient face to face for initial visit.  Recent office visits: 04/09/22 Dr Alphonsus Sias OV: ED f/u - near syncope, RSV.   03/27/22 Dr Alphonsus Sias OV: annual - A1c 11.0%; UACR 456. Increase Jardiance to 25 mg.  Tx bronchitis w/ doxy. Vit D 12 - take calcitriol daily.  03/17/22 Dr Alphonsus Sias OV: gout - rx prednisone, Rx colchicine for flares.  Recent consult visits: 05/20/22 Dr Suezanne Jacquet (Nephrology): f/u - pt advised to take iron tablets. 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  Hospital visits: 04/03/22 ED visit [facility]): generalized weakness, RSV. 03/09/22 ED visit [facility]): acute gout of wrist - rx prednisone, Percocet.   Objective:  Lab Results  Component Value Date   CREATININE 1.72 (H) 05/16/2022   BUN 20 05/16/2022   GFR 41.63 (L) 03/27/2022   GFRNONAA 41 (L) 05/16/2022   GFRAA 44 (L) 08/11/2019   NA 141 05/16/2022   K 3.4 (L) 05/16/2022   CALCIUM 9.2 05/16/2022   CO2 26 05/16/2022   GLUCOSE 116 (H) 05/16/2022   Lab Results  Component Value Date/Time   PTH 37 05/16/2022 09:42 AM    Lab Results  Component Value Date/Time   HGBA1C 11.0 (H) 03/27/2022 09:50 AM   HGBA1C 11.9 (A) 12/25/2021 08:59 AM   HGBA1C 9.8 (A) 06/18/2021 08:48 AM   HGBA1C 9.7 (H) 12/14/2020 10:31 AM   HGBA1C 10.1 03/06/2015 12:00 AM   GFR 41.63 (L) 03/27/2022 09:50 AM   GFR 36.13 (L) 09/13/2021 09:49 AM   MICROALBUR 291.8 (H) 03/27/2022 09:50 AM   MICROALBUR 281.1 (H) 06/26/2011 03:42 PM    Last diabetic Eye exam:  Lab Results  Component Value Date/Time   HMDIABEYEEXA Retinopathy (A) 09/18/2021 12:00 AM    Last diabetic Foot exam:  Lab Results  Component Value Date/Time   HMDIABFOOTEX done 03/27/2022 12:00 AM     Lab Results  Component Value Date   CHOL 187 03/27/2022   HDL 69.00 03/27/2022   LDLCALC 99 03/27/2022   LDLDIRECT 112.0 12/13/2019  TRIG 97.0 03/27/2022   CHOLHDL 3 03/27/2022       Latest Ref Rng & Units 05/16/2022    9:43 AM 04/03/2022    5:50 PM 03/27/2022    9:50 AM  Hepatic Function  Total Protein 6.5 - 8.1 g/dL  7.0    Albumin 3.5 - 5.0 g/dL 3.2  2.6  3.0   AST 15 - 41 U/L  16    ALT 0 - 44 U/L  25    Alk Phosphatase 38 - 126 U/L  83    Total Bilirubin 0.3 - 1.2 mg/dL  0.6      Lab Results  Component Value Date/Time   TSH 1.36 03/27/2022 09:50 AM   TSH 0.42 07/08/2013 10:14 AM   FREET4 0.82 12/13/2019 12:40 PM   FREET4 0.79 08/02/2018 09:02 AM        Latest Ref Rng & Units 05/16/2022    9:42 AM 04/03/2022    5:50 PM 03/09/2022   11:48 AM  CBC  WBC 4.0 - 10.5 K/uL 6.1  9.6  10.0   Hemoglobin 13.0 - 17.0 g/dL 16.111.3  09.613.0  04.510.8   Hematocrit 39.0 - 52.0 % 36.2  41.4  35.1   Platelets 150 - 400 K/uL 199  191  256    Iron/TIBC/Ferritin/ %Sat No results found for: "IRON", "TIBC", "FERRITIN", "IRONPCTSAT"   Lab Results  Component Value Date/Time   VD25OH 12.84 (L) 03/27/2022 09:50 AM   VD25OH 22.48 (L) 12/14/2020 10:31 AM   VITAMINB12 278 12/13/2019 12:40 PM    Clinical ASCVD: Yes  - aortic atherosclerosis The 10-year ASCVD risk score (Arnett DK, et al., 2019) is: 35.4%   Values used to calculate the score:     Age: [age] years     Sex: Male     Is Non-Hispanic African American: Yes     Diabetic: Yes     Tobacco smoker: No     Systolic Blood Pressure: 135 mmHg     Is BP treated: Yes     HDL Cholesterol: 69 mg/dL     Total Cholesterol: 187 mg/dL       4/0/98112/02/2022    9:149:30 AM 03/27/2022    8:51 AM 03/17/2022    8:32 AM  Depression screen PHQ 2/9  Decreased Interest 0 0 0  Down, Depressed, Hopeless 0 0 0  PHQ - 2 Score 0 0 0     Social History   Tobacco Use  Smoking Status Former   Packs/day: 0.25   Years: 37.00   Additional pack years: 0.00   Total pack years: 9.25   Types: Cigarettes   Quit date: 02/24/1998   Years since quitting: 24.2   Passive exposure: Past  Smokeless Tobacco Never   BP Readings from Last 3 Encounters:  05/08/22 (!) 140/80  04/09/22 132/84  04/03/22 133/82   Pulse Readings from Last 3 Encounters:  05/08/22 79  04/09/22 92  04/03/22 93   Wt Readings from Last 3 Encounters:  05/08/22 249 lb 8 oz (113.2 kg)  04/09/22 252 lb (114.3 kg)  04/03/22 256 lb (116.1 kg)   BMI Readings from Last 3 Encounters:  05/08/22 34.80 kg/m  04/09/22 35.15 kg/m  04/03/22 34.72 kg/m    Allergies  Allergen Reactions   Ibuprofen Swelling    LEGS   Cephalexin Rash    Medications Reviewed Today      Reviewed by Kathyrn SheriffFoltanski, Mega Kinkade N, [facility]CRPH (Pharmacist) on 06/04/22 at 1205  Med List Status: <None>  Medication Order Taking? Sig Documenting Provider Last Dose Status Informant  apixaban (ELIQUIS) 2.5 MG TABS tablet [id number] Yes Take 1 tablet (2.5 mg total) by mouth 2 (two) times daily. [name], MD Taking Active   atorvastatin (LIPITOR) 80 MG tablet [id number] Yes TAKE 1 TABLET EVERY DAY [name], MD Taking Active   Blood Glucose Calibration (ACCU-CHEK AVIVA) SOLN [id number] Yes  [provider] Taking Active Spouse/Significant Other  calcitRIOL (ROCALTROL) 0.25 MCG capsule [id number] Yes Take 0.25 mcg by mouth daily. [provider] Taking Active Spouse/Significant Other  Cholecalciferol (VITAMIN D) 50 MCG (2000 UT) tablet [id number] Yes Take 2,000 Units by mouth daily. [provider] Taking Active Spouse/Significant Other  colchicine 0.6 MG tablet [id number] Yes Take 1 tablet (0.6 mg total) by mouth 2 (two) times daily as needed. For gout flares [name], MD Taking Active   COMBIGAN 0.2-0.5 % ophthalmic solution [id number] Yes Place 1 drop into both eyes 2 (two) times daily.  [provider] Taking Active Spouse/Significant Other  empagliflozin (JARDIANCE) 25 MG TABS tablet [id number] Yes Take 1 tablet (25 mg total) by mouth daily before breakfast. [name], MD Taking Active            Med Note [name] Jun 04, 2022 12:05 PM) BI Cares 2024  fluticasone (FLONASE) 50 MCG/ACT nasal spray [id number] Yes SHAKE LIQUID AND USE 2 SPRAYS IN EACH NOSTRIL DAILY [name], MD Taking Active   furosemide (LASIX) 20 MG tablet [id number] Yes Take 20 mg by mouth 2 (two) times daily. [name], MD Taking Active   glucose blood (ACCU-CHEK AVIVA PLUS) test strip [id number] Yes USE TO TEST BLOOD SUGAR TWICE DAILY [name], MD Taking Active   insulin glargine (LANTUS SOLOSTAR) 100 UNIT/ML Solostar Pen [id number]  Yes Inject 50 Units into the skin daily. In the evening. [name], MD Taking Active   omeprazole (PRILOSEC) 20 MG capsule [id number] Yes Take 1 capsule (20 mg total) by mouth daily. [name], MD Taking Active   triamcinolone cream (KENALOG) 0.1 % [id number] Yes Apply 1 application. topically 2 (two) times daily. [name], MD Taking Active Spouse/Significant Other  TRULICITY 3 MG/0.5ML Namon Cirri [id number] Yes INJECT 3MG  (1 PEN) UNDER THE SKIN EVERY WEEK (REPLACES OZEMPIC) [name], MD Taking Active   Med List Note Judeth Horn, CPhT 07/06/21 1629): [place], [place] Spouse home [phone] cell [phone]             SDOH:  (Social Determinants of Health) assessments and interventions performed: Yes SDOH Interventions    Flowsheet Row Care Coordination from 06/04/2022 in [facility]  SDOH Interventions   Transportation Interventions Intervention Not Indicated  Financial Strain Interventions Other (Comment)  [Jardiance PAP approved]       Medication Assistance:  Carlota Raspberry Cares 2024 (faxed 04/17/22) Eliquis - BMS app faxed 01/28/22  Medication Access: Within the past 30 days, how often has patient missed a dose of medication? 0 Is a pillbox or other method used to improve adherence? Yes  Factors that may affect medication adherence? financial need and lack of understanding of disease management Are meds synced by current pharmacy? No  Are meds delivered by current pharmacy? Yes  Does patient experience delays in picking up medications due to transportation concerns? No   [facility] Reviewed: Is patient disadvantaged to use [facility]?: Yes  Current Rx insurance plan: [employer] Name and location of Current pharmacy:  [facility] - [place], [place] - [address] [place] [address] Phone: [phone] Fax: [phone]  [id number] - [place], [place] - [address] AT [facility] [address] [place] [address] Phone: [phone] Fax: [phone]  [facility] services reviewed with patient today?: No  Patient requests to transfer care to [facility]?: No  Reason patient declined to change pharmacies: Disadvantaged due to insurance/mail order  Compliance/Adherence/Medication fill history: Care Gaps: Colonoscopy (due 06/2015)  Star-Rating Drugs: Atorvastatin - PDC 100% Jardiance - PDC 47%; LF 04/27/22 x 30 ds Trulicity - PDC 91%   ASSESSMENT / PLAN  Heart Failure (Goal: manage symptoms and prevent exacerbations) -Controlled - pt has continued furosemide BID; he reports taking PM dose at bedtime and must wake up several times per night to urinate -Current home BP/HR readings: not checking -Current home daily weights: not checking -Last ejection fraction: 60-65% (Date: 06/2021) -HF type: HFpEF (EF > 50%) -NYHA Class: II (slight limitation of activity) -Current treatment: Furosemide 40 mg daily BID - Appropriate, Effective, Safe, Accessible Jardiance 25 mg daily - Appropriate, Effective, Safe, Accessible -Medications previously tried: ACE-I (AKI)  -Educated on Benefits of medications for managing symptoms and prolonging life Importance of blood pressure control -Recommended to continue current medication; advised to move PM dose of furosemide to 3:00-4:00 pm to avoid nocturia  Diabetes (A1c goal <8%) -Uncontrolled - A1c 11.0% (03/2022) -Current home glucose readings fasting glucose: 124, 122, 96, 139, 128, 90, 108, 123, 110 -Denies hypoglycemic/hyperglycemic symptoms -Current medications: Jardiance 25 mg daily (PAP) - Appropriate, Query Effective Lantus 50 units daily -Appropriate, Query Effective Trulicity 3 mg weekly -Appropriate, Query Effective -Medications previously tried: Ozempic (backorder)  -Current meal patterns:  breakfast: coffee, sausage egg biscuit  lunch: usually  skips  dinner: chicken tender, peas, creamed potatoes snacks: nabs drinks: diet ginger ale, coffee (equal, creamer), water -Current exercise: limited -Educated on A1c and blood sugar goals; Benefits of routine self-monitoring of blood sugar; Carbohydrate counting and/or plate method -Advised to check 2-hr post-prandial occasionally (goal < 200) -Recommended to continue current medication  DVT (Goal: prevent recurrent VTE) -Controlled - pt has plenty of Eliquis supply on hand (has been cutting 5 mg in half) -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, hematology workup negative for hypercoaguable state, recommendations to reduce Eliquis to 2.5 mg BID lifelong -Current treatment  Eliquis 2.5 mg BID - Appropriate, Effective, Safe, Accessible -Medications previously tried: n/a -Reviewe purpose of Eliquis; s/sx of bleeding  -Recommended to continue current medication  Hyperlipidemia: (LDL goal < 70) -Not ideally controlled - LDL 99 (03/2022) above ideal goal -Aortic atherosclerosis -Current treatment: Atorvastatin 80 mg daily - Appropriate, Query Effective -Medications previously tried: n/a  -Educated on Cholesterol goals; Importance of limiting foods high in cholesterol; -Recommended to continue current medication  Gout (Goal: Prevent gout flares) -Controlled - Uric acid 8.3 (02/2022) during gout flare -Last Gout Flare: 03/09/22 ED visit; pt reports he had a flare last month as well, treated with colchicine effectively; given increasing frequency of flares he may benefit from chronic urate-lowering therapy -Current treatment  Colchicine 0.6 mg BID PRN flares - Appropriate, Effective, Safe, Accessible -Medications previously tried: none  -We discussed:  Counseled patient on low purine diet plan. Counseled patient to reduce consumption of high-fructose corn syrup, sweetened soft drinks, fruit juices, meat, and seafood. -Recommend to start allopurinol 50 mg daily  Chronic Kidney Disease Stage 3b  -All  medications assessed for renal dosing and appropriateness in chronic kidney disease. 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 -Vitamin D deficiency (Vit D 12 - 03/2022): on D3 1000 IU daily; advised to increase Vitamin D to 5000 IU daily  Anemia (Goal: HGB > 11) -Controlled - HGB 11.3 (04/2022); no iron studies on file -Current treatment  Ferrous sulfate 325 mg daily - Appropriate, Effective, Safe, Accessible -Medications previously tried: n/a  -Recommended to continue current medication  Health Maintenance -Vaccine gaps: n/a -GERD: on omeprazole 20 mg daily; pt denies daily symptoms, suggested trial of PPI taper: reduce omeprazole to every other day -Hx BPH, no Rx needed   Al Corpus, PharmD, [place], CPP Clinical Pharmacist Practitioner [facility] at [facility] [phone]

## 2022-06-04 NOTE — Patient Instructions (Signed)
Visit Information  Phone number for Pharmacist: [phone]  Thank you for meeting with me to discuss your medications! I look forward to working with you to achieve your health care goals. Below is a summary of what we talked about during the visit:  Your [place] Pepper is coming from [facility] - call them for refills [phone])  Let us know if Trulicity is too expensive - this is one of your most important medications for diabetes.  Check your blood sugar 2 hours after dinner a few days a week. The goal is for this number to be less than 200.  You have plenty of Eliquis - you can use up the 5 mg dose by cutting pills in 1/2 and taking 1/2 tablet twice daily.  Move your PM Furosemide dose to the afternoon to prevent frequent urination overnight (it will be out of your system after 6 hours)  Try taking omeprazole every other day, keep track of heartburn/reflux symptoms.  Start Vitamin D 5000 units once a day - ask the pharmacist if you cannot find this dose.    Al Corpus, PharmD, BCACP Clinical Pharmacist [facility] at [facility] [phone]

## 2022-06-06 ENCOUNTER — Telehealth: Payer: Self-pay | Admitting: Pharmacist

## 2022-06-06 DIAGNOSIS — M1000 Idiopathic gout, unspecified site: Secondary | ICD-10-CM

## 2022-06-06 NOTE — Telephone Encounter (Signed)
Discussed gout with patient. He reports he has a gout flare every 1-2 months, treated effectively with colchicine each time. His last uric acid level was 8.3 during an acute flare.  Reviewed low-purine diet with patient; pt may also benefit from chronic urate-lowering therapy to reduce gout flares.  Recommend allopurinol 50 mg daily (GFR 30-60) Upcoming PCP appt 06/25/22. Consider checking uric acid level.   BMET    Component Value Date/Time   NA 141 05/16/2022 0943   K 3.4 (L) 05/16/2022 0943   CL 108 05/16/2022 0943   CO2 26 05/16/2022 0943   GLUCOSE 116 (H) 05/16/2022 0943   BUN 20 05/16/2022 0943   CREATININE 1.72 (H) 05/16/2022 0943   CALCIUM 9.2 05/16/2022 0943   GFRNONAA 41 (L) 05/16/2022 0943     Latest Reference Range & Units 01/18/20 19:38 03/09/22 11:48  Uric Acid, Serum 3.7 - 8.6 mg/dL  8.3  Uric Acid Body Fluid mg/dL 8.2

## 2022-06-09 MED ORDER — ALLOPURINOL 100 MG PO TABS: 50.0000 mg | ORAL_TABLET | Freq: Every day | ORAL | 1 refills | Status: DC

## 2022-06-09 NOTE — Telephone Encounter (Cosign Needed)
Patient has been notified and advised if he has any side effects (including rash) to contact the office. Patient expressed understanding.  Al Corpus, PharmD notified  Claudina Lick, [place] Clinical Pharmacy Assistant [phone]

## 2022-06-25 ENCOUNTER — Ambulatory Visit: Payer: Medicare PPO | Admitting: Internal Medicine

## 2022-06-25 ENCOUNTER — Encounter: Payer: Self-pay | Admitting: Internal Medicine

## 2022-06-25 VITALS — BP 130/84 | HR 84 | Temp 97.1°F | Ht 71.0 in | Wt 250.0 lb

## 2022-06-25 DIAGNOSIS — M1000 Idiopathic gout, unspecified site: Secondary | ICD-10-CM

## 2022-06-25 DIAGNOSIS — I5032 Chronic diastolic (congestive) heart failure: Secondary | ICD-10-CM | POA: Diagnosis not present

## 2022-06-25 DIAGNOSIS — I82562 Chronic embolism and thrombosis of left calf muscular vein: Secondary | ICD-10-CM

## 2022-06-25 DIAGNOSIS — E1142 Type 2 diabetes mellitus with diabetic polyneuropathy: Secondary | ICD-10-CM

## 2022-06-25 LAB — POCT GLYCOSYLATED HEMOGLOBIN (HGB A1C): Hemoglobin A1C: 8.9 % — AB (ref 4.0–5.6)

## 2022-06-25 NOTE — Assessment & Plan Note (Signed)
No recent attacks since starting allopurinol 50mg  daily Will check labs next time

## 2022-06-25 NOTE — Assessment & Plan Note (Signed)
No evidence of recurrence on the apixaban 5 bid
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
## 2022-06-25 NOTE — Assessment & Plan Note (Signed)
Lab Results  Component Value Date   HGBA1C 8.9 (A) 06/25/2022   Still not at goal but much better Continue trulicity 3mg  weekly, lantus 50, jardiance 25 Discussed improving lifestyle style with hopes to get A1c under 8%

## 2022-06-25 NOTE — Assessment & Plan Note (Signed)
Mild stable edema Compensated on bid furosemide 20

## 2022-06-26 ENCOUNTER — Other Ambulatory Visit: Payer: Self-pay | Admitting: Internal Medicine

## 2022-06-30 ENCOUNTER — Telehealth: Payer: Self-pay

## 2022-06-30 NOTE — Progress Notes (Signed)
Care Management & Coordination Services Pharmacy Team  Reason for Encounter: Appointment Reminder  Contacted patient to confirm telephone appointment with Al Corpus, PharmD on 07/03/2022 at 2:00.  Unsuccessful outreach. Left voicemail for patient to return call.  Star Rating Drugs:  Medication:  Last Fill: Day Supply Atorvastatin 80 mg 03/31/2022 90 Jardiance 25 mg 04/27/2022 30 Verified with [facility]  Care Gaps: Annual wellness visit in last year? Yes 03/27/2022  If Diabetic: Last eye exam / retinopathy screening: Up to date Last diabetic foot exam: Up to date  Al Corpus, PharmD notified  Claudina Lick, [place] Clinical Pharmacy Assistant [phone]

## 2022-07-03 ENCOUNTER — Ambulatory Visit: Payer: Medicare PPO | Admitting: Pharmacist

## 2022-07-03 NOTE — Progress Notes (Signed)
Care Management & Coordination Services Pharmacy Note  07/03/2022 Name:  Adam [name] MRN:  [id number] DOB:  December 05, 1950  Summary: F/U visit -DM: A1c 8.9% (06/2022) improved from 11.0% (03/2022); fasting glucose range 79-140 over last 2 weeks; he is not checking post-prandial glucose -HF: pt is taking furosemide 40 mg BID, he moved PM dose to afternoon and nocturia has improved -Gout: pt started allopurinol last month, he denies any issues so far -GERD: pt takes omeprazole 20 mg daily for years; pt denies daily symptoms -Vitamin D deficiency: Vit D level 12 (03/2022), pt started 5000 IU of vitamin D daily  Recommendations/Changes made from today's visit: -Advised to check BG 2-hr after meal a few times a week -Discussed focusing on diet changes to further improve A1c (goal < 8%) -Trial PPI taper: omeprazole every other day, monitor reflux  Follow up plan: -Health Concierge will call patient 2-3 weeks for DM update -Pharmacist follow up televisit scheduled for 4 months -Hematology appt 07/07/22; PCP 09/25/22; Cardiology appt 11/13/22    Subjective: Adam [name] is an 72 y.o. year old male who is a primary patient of [name], MD.  The care coordination team was consulted for assistance with disease management and care coordination needs.    Engaged with patient by telephone for follow up visit.  Recent office visits: 06/25/22 Dr Alphonsus Sias OV: A1c 8.9%; discussed lifestyle to improve to A1c < 8%  04/09/22 Dr Alphonsus Sias OV: ED f/u - near syncope, RSV.   03/27/22 Dr Alphonsus Sias OV: annual - A1c 11.0%; UACR 456. Increase Jardiance to 25 mg.  Tx bronchitis w/ doxy. Vit D 12 - take calcitriol daily.  03/17/22 Dr Alphonsus Sias OV: gout - rx prednisone, Rx colchicine for flares.  Recent consult visits: 05/20/22 Dr Suezanne Jacquet (Nephrology): f/u - pt advised to take iron tablets. 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  Hospital visits: 04/03/22 ED visit [facility]): generalized weakness, RSV. 03/09/22 ED visit [facility]): acute gout of wrist - rx prednisone, Percocet.   Objective:  Lab Results  Component Value Date   CREATININE 1.72 (H) 05/16/2022   BUN 20 05/16/2022   GFR 41.63 (L) 03/27/2022   GFRNONAA 41 (L) 05/16/2022   GFRAA 44 (L) 08/11/2019   NA 141 05/16/2022   K 3.4 (L) 05/16/2022   CALCIUM 9.2 05/16/2022   CO2 26 05/16/2022   GLUCOSE 116 (H) 05/16/2022   Lab Results  Component Value Date/Time   PTH 37 05/16/2022 09:42 AM    Lab Results  Component Value Date/Time   HGBA1C 8.9 (A) 06/25/2022 08:55 AM   HGBA1C 11.0 (H) 03/27/2022 09:50 AM   HGBA1C 11.9 (A) 12/25/2021 08:59 AM   HGBA1C 9.7 (H) 12/14/2020 10:31 AM   HGBA1C 10.1 03/06/2015 12:00 AM   GFR 41.63 (L) 03/27/2022 09:50 AM   GFR 36.13 (L) 09/13/2021 09:49 AM   MICROALBUR 291.8 (H) 03/27/2022 09:50 AM   MICROALBUR 281.1 (H) 06/26/2011 03:42 PM    Last diabetic Eye exam:  Lab Results  Component Value Date/Time   HMDIABEYEEXA Retinopathy (A) 09/18/2021 12:00 AM    Last diabetic Foot exam:  Lab Results  Component Value Date/Time   HMDIABFOOTEX done 03/27/2022 12:00 AM     Lab Results  Component Value Date   CHOL 187 03/27/2022   HDL 69.00 03/27/2022   LDLCALC 99 03/27/2022   LDLDIRECT 112.0 12/13/2019  TRIG 97.0 03/27/2022   CHOLHDL 3 03/27/2022       Latest Ref Rng & Units 05/16/2022    9:43 AM 04/03/2022    5:50 PM 03/27/2022    9:50 AM  Hepatic Function  Total Protein 6.5 - 8.1 g/dL  7.0    Albumin 3.5 - 5.0 g/dL 3.2  2.6  3.0   AST 15 - 41 U/L  16    ALT 0 - 44 U/L  25    Alk Phosphatase 38 - 126 U/L  83    Total Bilirubin 0.3 - 1.2 mg/dL  0.6      Lab Results  Component Value Date/Time   TSH 1.36 03/27/2022 09:50 AM   TSH 0.42 07/08/2013 10:14 AM   FREET4 0.82 12/13/2019 12:40 PM   FREET4 0.79 08/02/2018 09:02 AM       Latest Ref Rng & Units  05/16/2022    9:42 AM 04/03/2022    5:50 PM 03/09/2022   11:48 AM  CBC  WBC 4.0 - 10.5 K/uL 6.1  9.6  10.0   Hemoglobin 13.0 - 17.0 g/dL 91.4  78.2  95.6   Hematocrit 39.0 - 52.0 % 36.2  41.4  35.1   Platelets 150 - 400 K/uL 199  191  256    Iron/TIBC/Ferritin/ %Sat No results found for: "IRON", "TIBC", "FERRITIN", "IRONPCTSAT"   Lab Results  Component Value Date/Time   VD25OH 12.84 (L) 03/27/2022 09:50 AM   VD25OH 22.48 (L) 12/14/2020 10:31 AM   VITAMINB12 278 12/13/2019 12:40 PM    Clinical ASCVD: Yes  - aortic atherosclerosis The 10-year ASCVD risk score (Arnett DK, et al., 2019) is: 33.4%   Values used to calculate the score:     Age: 72 years     Sex: Male     Is Non-Hispanic African American: Yes     Diabetic: Yes     Tobacco smoker: No     Systolic Blood Pressure: 130 mmHg     Is BP treated: Yes     HDL Cholesterol: 69 mg/dL     Total Cholesterol: 187 mg/dL       03/27/3084    5:78 AM 03/27/2022    8:51 AM 03/17/2022    8:32 AM  Depression screen PHQ 2/9  Decreased Interest 0 0 0  Down, Depressed, Hopeless 0 0 0  PHQ - 2 Score 0 0 0     Social History   Tobacco Use  Smoking Status Former   Packs/day: 0.25   Years: 37.00   Additional pack years: 0.00   Total pack years: 9.25   Types: Cigarettes   Quit date: 02/24/1998   Years since quitting: 24.3   Passive exposure: Past  Smokeless Tobacco Never   BP Readings from Last 3 Encounters:  06/25/22 130/84  05/08/22 (!) 140/80  04/09/22 132/84   Pulse Readings from Last 3 Encounters:  06/25/22 84  05/08/22 79  04/09/22 92   Wt Readings from Last 3 Encounters:  06/25/22 250 lb (113.4 kg)  05/08/22 249 lb 8 oz (113.2 kg)  04/09/22 252 lb (114.3 kg)   BMI Readings from Last 3 Encounters:  06/25/22 34.87 kg/m  05/08/22 34.80 kg/m  04/09/22 35.15 kg/m    Allergies  Allergen Reactions   Ibuprofen Swelling    LEGS   Cephalexin Rash    Medications Reviewed Today     Reviewed by Kathyrn Sheriff, [facility] (Pharmacist) on 07/03/22 at 1455  Med List Status: <None>  Medication Order Taking? Sig Documenting Provider Last Dose Status Informant  allopurinol (ZYLOPRIM) 100 MG tablet [id number] Yes Take 0.5 tablets (50 mg total) by mouth daily. For prevention of gout flares [name], MD Taking Active   apixaban (ELIQUIS) 2.5 MG TABS tablet [id number] Yes Take 1 tablet (2.5 mg total) by mouth 2 (two) times daily. [name], MD Taking Active   atorvastatin (LIPITOR) 80 MG tablet [id number] Yes TAKE 1 TABLET EVERY DAY [name], MD Taking Active   Blood Glucose Calibration (ACCU-CHEK AVIVA) SOLN [id number] Yes  [provider] Taking Active Spouse/Significant Other  calcitRIOL (ROCALTROL) 0.25 MCG capsule [id number] Yes Take 0.25 mcg by mouth daily. [provider] Taking Active Spouse/Significant Other  Cholecalciferol (VITAMIN D) 50 MCG (2000 UT) tablet [id number] Yes Take 5,000 Units by mouth daily. [provider] Taking Active Spouse/Significant Other  colchicine 0.6 MG tablet [id number] Yes Take 1 tablet (0.6 mg total) by mouth 2 (two) times daily as needed. For gout flares [name], MD Taking Active   COMBIGAN 0.2-0.5 % ophthalmic solution [id number] Yes Place 1 drop into both eyes 2 (two) times daily.  [provider] Taking Active Spouse/Significant Other  empagliflozin (JARDIANCE) 25 MG TABS tablet [id number] Yes Take 1 tablet (25 mg total) by mouth daily before breakfast. [name], MD Taking Active            Med Note [name] Jun 04, 2022 12:05 PM) BI Cares 2024  fluticasone (FLONASE) 50 MCG/ACT nasal spray [id number] Yes SHAKE LIQUID AND USE 2 SPRAYS IN EACH NOSTRIL DAILY [name], MD Taking Active   furosemide (LASIX) 20 MG tablet [id number] Yes Take 20 mg by mouth 2 (two) times daily. [name], MD Taking Active   glucose blood (ACCU-CHEK AVIVA PLUS) test strip [id number] Yes USE  TO TEST BLOOD SUGAR TWICE DAILY [name], MD Taking Active   insulin glargine (LANTUS SOLOSTAR) 100 UNIT/ML Solostar Pen [id number] Yes Inject 50 Units into the skin daily. In the evening. [name], MD Taking Active   omeprazole (PRILOSEC) 20 MG capsule [id number] Yes TAKE 1 CAPSULE(20 MG) BY MOUTH DAILY [name], MD Taking Active   triamcinolone cream (KENALOG) 0.1 % [id number] Yes Apply 1 application. topically 2 (two) times daily. [name], MD Taking Active Spouse/Significant Other  TRULICITY 3 MG/0.5ML Namon Cirri [id number] Yes INJECT 3MG  (1 PEN) UNDER THE SKIN EVERY WEEK (REPLACES OZEMPIC) [name], MD Taking Active   Med List Note Judeth Horn, CPhT 07/06/21 1629): [place], [place] Spouse home [phone] cell [phone]             SDOH:  (Social Determinants of Health) assessments and interventions performed: Yes SDOH Interventions    Flowsheet Row Care Coordination from 06/04/2022 in [facility]  SDOH Interventions   Transportation Interventions Intervention Not Indicated  Financial Strain Interventions Other (Comment)  [Jardiance PAP approved]       Medication Assistance:  Carlota Raspberry Cares 2024 approved 2024 Eliquis - BMS app faxed 01/28/22  Medication Access: Within the past 30 days, how often has patient missed a dose of medication? 0 Is a pillbox or other method used to improve adherence? Yes  Factors that may affect medication adherence? financial need and lack of understanding of disease management Are meds synced by current pharmacy? No  Are meds delivered by current pharmacy? Yes  Does patient experience delays in picking up medications due to transportation  concerns? No   [facility] Reviewed: Is patient disadvantaged to use [facility]?: Yes  Current Rx insurance plan: [employer] Name and location of Current pharmacy:  [facility] - [place], [place] - [address] [place] [address] Phone: [phone] Fax: [phone]  [id number] - [place], [place] - [address] AT [facility] [address] [facility] [address] [place] [address] Phone: [phone] Fax: [phone]  [facility] services reviewed with patient today?: No  Patient requests to transfer care to [facility]?: No  Reason patient declined to change pharmacies: Disadvantaged due to insurance/mail order  Compliance/Adherence/Medication fill history: Care Gaps: Colonoscopy (due 06/2015)  Star-Rating Drugs: Atorvastatin - PDC 100% Jardiance - PAP Trulicity - PDC 92%   ASSESSMENT / PLAN  Heart Failure (Goal: manage symptoms and prevent exacerbations) -Controlled - pt has moved PM dose of furosemide to ~afternoon with improvement in nocturia -Current home BP/HR readings: not checking -Current home daily weights: not checking -Last ejection fraction: 60-65% (Date: 06/2021) -HF type: HFpEF (EF > 50%) -NYHA Class: II (slight limitation of activity) -Current treatment: Furosemide 40 mg daily BID - Appropriate, Effective, Safe, Accessible Jardiance 25 mg daily - Appropriate, Effective, Safe, Accessible -Medications previously tried: ACE-I (AKI)  -Educated on Benefits of medications for managing symptoms and prolonging life Importance of blood pressure control -Recommended to continue current medication  Diabetes (A1c goal <8%) -Uncontrolled, improving - A1c 8.9% (06/2022) improved from 11.0% (03/2022) -Current home glucose readings fasting glucose: 79, 109, 94, 107, 139, 90, 116,  -Denies hypoglycemic/hyperglycemic symptoms -Current medications: Jardiance 25 mg daily (PAP) - Appropriate, Query Effective Lantus 50 units daily -Appropriate, Query Effective Trulicity 3 mg weekly -Appropriate, Query Effective -Medications previously tried: Ozempic (backorder)  -Current meal patterns:  breakfast: coffee, sausage egg  biscuit  lunch: usually skips  dinner: chicken tender, peas, creamed potatoes snacks: nabs drinks: diet ginger ale, coffee (equal, creamer), water -Current exercise: limited -Educated on A1c and blood sugar goals; Benefits of routine self-monitoring of blood sugar; Carbohydrate counting and/or plate method -Advised to check 2-hr post-prandial occasionally (goal < 200) -Recommended to continue current medication  DVT (Goal: prevent recurrent VTE) -Controlled - pt has plenty of Eliquis supply on hand (has been cutting 5 mg in half) -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, hematology workup negative for hypercoaguable state, recommendations to reduce Eliquis to 2.5 mg BID lifelong -Current treatment  Eliquis 2.5 mg BID - Appropriate, Effective, Safe, Accessible -Medications previously tried: n/a -Reviewe purpose of Eliquis; s/sx of bleeding  -Recommended to continue current medication  Hyperlipidemia: (LDL goal < 70) -Not ideally controlled - LDL 99 (03/2022) above ideal goal -Aortic atherosclerosis -Current treatment: Atorvastatin 80 mg daily - Appropriate, Query Effective -Medications previously tried: n/a  -Educated on Cholesterol goals; Importance of limiting foods high in cholesterol; -Recommended to continue current medication  Gout (Goal: Prevent gout flares) -Controlled - Uric acid 8.3 (02/2022) during gout flare -Last Gout Flare: 03/09/22 ED visit; pt reports he had gout flares every 1-2 months typically, treated successfully with colchicine; he started allopurinol last month and denies any gout flares since then -Current treatment  Colchicine 0.6 mg BID PRN flares - Appropriate, Effective, Safe, Accessible Allopurinol 50 mg daily - Appropriate, Effective, Safe, Accessible -Medications previously tried: none  -We discussed:  Counseled patient on low purine diet plan. Counseled patient to reduce consumption of high-fructose corn syrup, sweetened soft drinks, fruit juices, meat, and  seafood. -Recommend  to continue current medication  Chronic Kidney Disease Stage 3b  -All medications assessed for renal dosing and appropriateness in chronic kidney disease. 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 -Vitamin D deficiency (Vit D 12 - 03/2022): on D3 5000 IU daily (started higher dose 05/2022)  Anemia (Goal: HGB > 11) -Controlled - HGB 11.3 (04/2022); no iron studies on file -Current treatment  Ferrous sulfate 325 mg daily - Appropriate, Effective, Safe, Accessible -Medications previously tried: n/a  -Recommended to continue current medication  Health Maintenance -Vaccine gaps: n/a -GERD: on omeprazole 20 mg daily; pt denies daily symptoms, suggested trial of PPI taper: reduce omeprazole to every other day -Hx BPH, no Rx needed   Al Corpus, PharmD, [place], CPP Clinical Pharmacist Practitioner [facility] at [facility] [phone]

## 2022-07-07 ENCOUNTER — Inpatient Hospital Stay (HOSPITAL_BASED_OUTPATIENT_CLINIC_OR_DEPARTMENT_OTHER): Payer: Medicare PPO | Admitting: Oncology

## 2022-07-07 ENCOUNTER — Inpatient Hospital Stay: Payer: Medicare PPO | Attending: Oncology

## 2022-07-07 ENCOUNTER — Encounter: Payer: Self-pay | Admitting: Oncology

## 2022-07-07 ENCOUNTER — Other Ambulatory Visit: Payer: Self-pay

## 2022-07-07 VITALS — BP 150/80 | HR 85 | Temp 96.6°F | Wt 251.3 lb

## 2022-07-07 DIAGNOSIS — M109 Gout, unspecified: Secondary | ICD-10-CM | POA: Diagnosis not present

## 2022-07-07 DIAGNOSIS — C220 Liver cell carcinoma: Secondary | ICD-10-CM | POA: Insufficient documentation

## 2022-07-07 DIAGNOSIS — Z87891 Personal history of nicotine dependence: Secondary | ICD-10-CM | POA: Insufficient documentation

## 2022-07-07 DIAGNOSIS — D631 Anemia in chronic kidney disease: Secondary | ICD-10-CM | POA: Diagnosis not present

## 2022-07-07 DIAGNOSIS — Z886 Allergy status to analgesic agent status: Secondary | ICD-10-CM | POA: Insufficient documentation

## 2022-07-07 DIAGNOSIS — C641 Malignant neoplasm of right kidney, except renal pelvis: Secondary | ICD-10-CM

## 2022-07-07 DIAGNOSIS — Z794 Long term (current) use of insulin: Secondary | ICD-10-CM | POA: Diagnosis not present

## 2022-07-07 DIAGNOSIS — Z881 Allergy status to other antibiotic agents status: Secondary | ICD-10-CM | POA: Diagnosis not present

## 2022-07-07 DIAGNOSIS — N189 Chronic kidney disease, unspecified: Secondary | ICD-10-CM

## 2022-07-07 DIAGNOSIS — K862 Cyst of pancreas: Secondary | ICD-10-CM | POA: Insufficient documentation

## 2022-07-07 DIAGNOSIS — N1832 Chronic kidney disease, stage 3b: Secondary | ICD-10-CM

## 2022-07-07 DIAGNOSIS — Z79899 Other long term (current) drug therapy: Secondary | ICD-10-CM | POA: Insufficient documentation

## 2022-07-07 DIAGNOSIS — I82401 Acute embolism and thrombosis of unspecified deep veins of right lower extremity: Secondary | ICD-10-CM

## 2022-07-07 DIAGNOSIS — Z7901 Long term (current) use of anticoagulants: Secondary | ICD-10-CM | POA: Diagnosis not present

## 2022-07-07 DIAGNOSIS — Z8719 Personal history of other diseases of the digestive system: Secondary | ICD-10-CM | POA: Insufficient documentation

## 2022-07-07 DIAGNOSIS — I129 Hypertensive chronic kidney disease with stage 1 through stage 4 chronic kidney disease, or unspecified chronic kidney disease: Secondary | ICD-10-CM | POA: Diagnosis not present

## 2022-07-07 DIAGNOSIS — M7989 Other specified soft tissue disorders: Secondary | ICD-10-CM | POA: Insufficient documentation

## 2022-07-07 DIAGNOSIS — E1122 Type 2 diabetes mellitus with diabetic chronic kidney disease: Secondary | ICD-10-CM | POA: Diagnosis not present

## 2022-07-07 DIAGNOSIS — Z5986 Financial insecurity: Secondary | ICD-10-CM | POA: Diagnosis not present

## 2022-07-07 LAB — CBC WITH DIFFERENTIAL/PLATELET
Abs Immature Granulocytes: 0.01 10*3/uL (ref 0.00–0.07)
Basophils Absolute: 0 10*3/uL (ref 0.0–0.1)
Basophils Relative: 1 %
Eosinophils Absolute: 0.3 10*3/uL (ref 0.0–0.5)
Eosinophils Relative: 4 %
HCT: 34.1 % — ABNORMAL LOW (ref 39.0–52.0)
Hemoglobin: 10.5 g/dL — ABNORMAL LOW (ref 13.0–17.0)
Immature Granulocytes: 0 %
Lymphocytes Relative: 49 %
Lymphs Abs: 3.3 10*3/uL (ref 0.7–4.0)
MCH: 27 pg (ref 26.0–34.0)
MCHC: 30.8 g/dL (ref 30.0–36.0)
MCV: 87.7 fL (ref 80.0–100.0)
Monocytes Absolute: 0.6 10*3/uL (ref 0.1–1.0)
Monocytes Relative: 8 %
Neutro Abs: 2.6 10*3/uL (ref 1.7–7.7)
Neutrophils Relative %: 38 %
Platelets: 221 10*3/uL (ref 150–400)
RBC: 3.89 MIL/uL — ABNORMAL LOW (ref 4.22–5.81)
RDW: 16.1 % — ABNORMAL HIGH (ref 11.5–15.5)
WBC: 6.7 10*3/uL (ref 4.0–10.5)
nRBC: 0 % (ref 0.0–0.2)

## 2022-07-07 LAB — COMPREHENSIVE METABOLIC PANEL
ALT: 15 U/L (ref 0–44)
AST: 23 U/L (ref 15–41)
Albumin: 3.2 g/dL — ABNORMAL LOW (ref 3.5–5.0)
Alkaline Phosphatase: 73 U/L (ref 38–126)
Anion gap: 7 (ref 5–15)
BUN: 29 mg/dL — ABNORMAL HIGH (ref 8–23)
CO2: 25 mmol/L (ref 22–32)
Calcium: 8.5 mg/dL — ABNORMAL LOW (ref 8.9–10.3)
Chloride: 106 mmol/L (ref 98–111)
Creatinine, Ser: 1.96 mg/dL — ABNORMAL HIGH (ref 0.61–1.24)
GFR, Estimated: 35 mL/min — ABNORMAL LOW (ref >60)
Glucose, Bld: 187 mg/dL — ABNORMAL HIGH (ref 70–99)
Potassium: 3.8 mmol/L (ref 3.5–5.1)
Sodium: 138 mmol/L (ref 135–145)
Total Bilirubin: 0.4 mg/dL (ref 0.3–1.2)
Total Protein: 7.9 g/dL (ref 6.5–8.1)

## 2022-07-07 LAB — IRON AND TIBC
Iron: 33 ug/dL — ABNORMAL LOW (ref 45–182)
Saturation Ratios: 14 % — ABNORMAL LOW (ref 17.9–39.5)
TIBC: 242 ug/dL — ABNORMAL LOW (ref 250–450)
UIBC: 209 ug/dL

## 2022-07-07 LAB — RETIC PANEL
Immature Retic Fract: 10.7 % (ref 2.3–15.9)
RBC.: 3.86 MIL/uL — ABNORMAL LOW (ref 4.22–5.81)
Retic Count, Absolute: 51 10*3/uL (ref 19.0–186.0)
Retic Ct Pct: 1.3 % (ref 0.4–3.1)
Reticulocyte Hemoglobin: 26.8 pg — ABNORMAL LOW (ref >27.9)

## 2022-07-07 LAB — FERRITIN: Ferritin: 28 ng/mL (ref 24–336)

## 2022-07-07 LAB — LACTATE DEHYDROGENASE: LDH: 186 U/L (ref 98–192)

## 2022-07-07 MED ORDER — FERROUS SULFATE 325 (65 FE) MG PO TBEC: 325.0000 mg | DELAYED_RELEASE_TABLET | Freq: Two times a day (BID) | ORAL | 2 refills | Status: DC

## 2022-07-07 NOTE — Assessment & Plan Note (Signed)
Observation  

## 2022-07-07 NOTE — Assessment & Plan Note (Signed)
#  unprovoked acute lower extremity DVT.  Negative prothrombin gene and factor V Leiden mutation, negative APS antibodies..Repeat left lower extremity DVT.  Continue Eliquis 2.5mg  BID for long term prophylaxis.   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

## 2022-07-07 NOTE — Addendum Note (Signed)
Addended by: [name] on: 07/07/2022 08:40 PM   Modules accepted: Orders

## 2022-07-07 NOTE — Assessment & Plan Note (Signed)
Stable Cr.  Avoid nephrotoxins and encourage oral hydration.  
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
## 2022-07-07 NOTE — Assessment & Plan Note (Addendum)
Hemoglobin has decreased. I did iron panel showed Lab Results  Component Value Date   HGB 10.5 (L) 07/07/2022   TIBC 242 (L) 07/07/2022   IRONPCTSAT 14 (L) 07/07/2022   FERRITIN 28 07/07/2022    Discussed about the option of iron supplementation versus IV Venofer treatments.  Patient would like to try oral iron supplementation. I recommend ferrous sulfate 325 mg twice daily.  Prescription sent to pharmacy.

## 2022-07-07 NOTE — Assessment & Plan Note (Signed)
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  Repeat MRI abdomen w wo in June 2024.  

## 2022-07-08 ENCOUNTER — Telehealth: Payer: Self-pay

## 2022-07-08 NOTE — Telephone Encounter (Signed)
Pt informed and verbalized understanding

## 2022-07-08 NOTE — Telephone Encounter (Signed)
-----   Message from [name], MD sent at 07/07/2022  8:42 PM EDT ----- Please let him know that his iron level is low, I recommend him to take oral iron supplementation.  Ferrous sulfate 325mg  BID. Rx sent.

## 2022-07-11 ENCOUNTER — Inpatient Hospital Stay: Payer: Medicare PPO | Admitting: Licensed Clinical Social Worker

## 2022-07-11 DIAGNOSIS — C641 Malignant neoplasm of right kidney, except renal pelvis: Secondary | ICD-10-CM

## 2022-07-11 NOTE — Progress Notes (Signed)
[facility] Clinical Social Work  Initial Assessment   Adam [name] is a 72 y.o. year old male contacted caregiver by phone. Clinical Social Work was referred by medical provider for assessment of psychosocial needs.   SDOH (Social Determinants of Health) assessments performed: Yes SDOH Interventions    Flowsheet Row Clinical Support from 07/11/2022 in [facility] Coordination from 06/04/2022 in [facility]  SDOH Interventions    Food Insecurity Interventions Intervention Not Indicated --  Housing Interventions Intervention Not Indicated --  Transportation Interventions -- Intervention Not Indicated  Utilities Interventions Intervention Not Indicated --  Alcohol Usage Interventions Intervention Not Indicated (Score <7) --  Financial Strain Interventions -- Other (Comment)  [[employer] PAP approved]  Physical Activity Interventions Intervention Not Indicated --  Stress Interventions Intervention Not Indicated --  Social Connections Interventions Intervention Not Indicated --       SDOH Screenings   Food Insecurity: Food Insecurity Present (07/11/2022)  Housing: Low Risk  (07/11/2022)  Transportation Needs: No Transportation Needs (06/06/2022)  Utilities: Not At Risk (07/11/2022)  Alcohol Screen: Low Risk  (07/11/2022)  Depression (PHQ2-9): Low Risk  (03/27/2022)  Financial Resource Strain: Medium Risk (06/06/2022)  Physical Activity: Inactive (07/11/2022)  Social Connections: Moderately Integrated (07/11/2022)  Stress: No Stress Concern Present (07/11/2022)  Tobacco Use: Medium Risk (07/07/2022)     Distress Screen completed: No     No data to display            Family/Social Information:  Housing Arrangement: patient lives with spouse  Adam [name] [phone]  Family members/support persons in your life? Family, Friends, Warehouse manager, and Ambulance person concerns: no  Employment: Retired  .  Income source: Geographical information systems officer concerns: Yes, current concerns Type of concern:  Patient has general financial affordability concerns ans medication affordability Food access concerns: no Religious or spiritual practice: Yes-  Services Currently in place:  [employer] MEDICARE CHOICE PPO   Coping/ Adjustment to diagnosis: Patient understands treatment plan and what happens next? yes Concerns about diagnosis and/or treatment: medication affordability Patient reported stressors: Therapist, [name] and/or priorities: financial assistance for medications Patient enjoys time with family/ friends Current coping skills/ strengths: Average or above average intelligence , Capable of independent living , Manufacturing systems engineer , Contractor , General fund of knowledge , Motivation for treatment/growth , Physical Health , Religious Affiliation , Special hobby/interest , Supportive family/friends , and Work skills     SUMMARY: Current SDOH Barriers:  Financial constraints related to fixed income and medication affordability  Clinical Social Work Clinical Goal(s):  No clinical social work goals at this time  Interventions: Discussed common feeling and emotions when being diagnosed with cancer, and the importance of support during treatment Informed patient of the support team roles and support services at [facility] Provided CSW contact information and encouraged patient to call with any questions or concerns Provided patient with information about CSW role in patient care and other available resources.   Follow Up Plan: Patient will contact CSW with any support or resource needs Patient verbalizes understanding of plan: Yes    Adam Art, LCSW Clinical Social Worker [facility]

## 2022-07-12 ENCOUNTER — Other Ambulatory Visit: Payer: Self-pay | Admitting: Internal Medicine

## 2022-07-25 ENCOUNTER — Telehealth: Payer: Self-pay

## 2022-07-25 NOTE — Telephone Encounter (Signed)
-----   Message from Garry Heater [place], [facility] sent at 07/03/2022  2:52 PM EDT ----- Regarding: DM update Call patient for glucose readings from past 2 weeks - especially post-meal readings.

## 2022-07-25 NOTE — Progress Notes (Signed)
Called patient for his blood glucose readings as previously discussed with Al Corpus, PharmD.  No answer; left message.  Al Corpus, PharmD notified  Claudina Lick, [place] Clinical Pharmacy Assistant [phone]

## 2022-08-01 ENCOUNTER — Telehealth: Payer: Self-pay

## 2022-08-01 NOTE — Telephone Encounter (Signed)
-----   Message from Lindsey N Foltanski, RPH sent at 07/03/2022  2:52 PM EDT ----- Regarding: DM update Call patient for glucose readings from past 2 weeks - especially post-meal readings.  

## 2022-08-01 NOTE — Progress Notes (Cosign Needed)
Tried calling patient for his readings over the past two weeks. No answer; left message.  Al Corpus, PharmD notified  Claudina Lick, [place] Clinical Pharmacy Assistant [phone]

## 2022-08-06 ENCOUNTER — Other Ambulatory Visit: Payer: Self-pay | Admitting: Internal Medicine

## 2022-08-06 ENCOUNTER — Telehealth: Payer: Self-pay | Admitting: Internal Medicine

## 2022-08-06 MED ORDER — EMPAGLIFLOZIN 25 MG PO TABS: 25.0000 mg | ORAL_TABLET | Freq: Every day | ORAL | 3 refills | Status: AC

## 2022-08-06 NOTE — Telephone Encounter (Signed)
Rx sent electronically.  

## 2022-08-06 NOTE — Telephone Encounter (Signed)
Prescription Request  08/06/2022  LOV: 06/25/2022  What is the name of the medication or equipment?  empagliflozin (JARDIANCE) 25 MG TABS tablet   Have you contacted your pharmacy to request a refill? Yes   Which pharmacy would you like this sent to?  [facility] - [place], [place] - [address] [place] [address] Phone: [phone] Fax: [phone]    Patient notified that their request is being sent to the clinical staff for review and that they should receive a response within 2 business days.   Please advise at [facility] [phone]

## 2022-08-07 ENCOUNTER — Ambulatory Visit
Admission: RE | Admit: 2022-08-07 | Discharge: 2022-08-07 | Disposition: A | Discharge status: Home / Self Care | Payer: Medicare PPO | Source: Ambulatory Visit | Attending: Oncology | Admitting: Oncology

## 2022-08-07 DIAGNOSIS — C641 Malignant neoplasm of right kidney, except renal pelvis: Secondary | ICD-10-CM | POA: Insufficient documentation

## 2022-08-07 DIAGNOSIS — N289 Disorder of kidney and ureter, unspecified: Secondary | ICD-10-CM | POA: Diagnosis not present

## 2022-08-07 MED ORDER — GADOBUTROL 1 MMOL/ML IV SOLN
10.0000 mL | Freq: Once | INTRAVENOUS | Status: AC | PRN
  Administered 2022-08-07: 10 mL via INTRAVENOUS

## 2022-08-08 ENCOUNTER — Ambulatory Visit: Payer: Medicare PPO | Attending: Cardiovascular Disease

## 2022-08-08 DIAGNOSIS — I5032 Chronic diastolic (congestive) heart failure: Secondary | ICD-10-CM | POA: Diagnosis not present

## 2022-08-08 DIAGNOSIS — I77810 Thoracic aortic ectasia: Secondary | ICD-10-CM

## 2022-08-08 LAB — ECHOCARDIOGRAM COMPLETE
AR max vel: 3.23 cm2
AV Area VTI: 3.45 cm2
AV Area mean vel: 3.25 cm2
AV Mean grad: 3 mmHg
AV Peak grad: 5.4 mmHg
Ao pk vel: 1.16 m/s
Area-P 1/2: 2.87 cm2
P 1/2 time: 613 msec
S' Lateral: 3.5 cm

## 2022-08-08 MED ORDER — PERFLUTREN LIPID MICROSPHERE
1.0000 mL | INTRAVENOUS | Status: AC | PRN
Start: 2022-08-08 — End: 2022-08-08
  Administered 2022-08-08: 2 mL via INTRAVENOUS

## 2022-08-12 ENCOUNTER — Other Ambulatory Visit: Payer: Self-pay | Admitting: Interventional Radiology

## 2022-08-12 DIAGNOSIS — C641 Malignant neoplasm of right kidney, except renal pelvis: Secondary | ICD-10-CM

## 2022-08-23 ENCOUNTER — Other Ambulatory Visit: Payer: Self-pay | Admitting: Oncology

## 2022-08-23 DIAGNOSIS — K862 Cyst of pancreas: Secondary | ICD-10-CM

## 2022-09-03 ENCOUNTER — Telehealth: Payer: Self-pay | Admitting: Internal Medicine

## 2022-09-03 MED ORDER — LANTUS SOLOSTAR 100 UNIT/ML ~~LOC~~ SOPN: 50.0000 [IU] | PEN_INJECTOR | Freq: Every day | SUBCUTANEOUS | 0 refills | Status: DC

## 2022-09-03 NOTE — Telephone Encounter (Signed)
Rx sent electronically.  

## 2022-09-03 NOTE — Telephone Encounter (Signed)
Patient wife would like to know if an emergency supply of insulin glargine (LANTUS SOLOSTAR) 100 UNIT/ML Solostar Pen can be sent in to [id number] [place], [place] - [address] AT [facility] OF [address] Phone: [phone]  Fax: [phone]      . She said that he is completely out and the mail delivery said that it would be 2-5 days for him to receive even if they expedite it.

## 2022-09-04 DIAGNOSIS — I4891 Unspecified atrial fibrillation: Secondary | ICD-10-CM | POA: Diagnosis not present

## 2022-09-04 DIAGNOSIS — R269 Unspecified abnormalities of gait and mobility: Secondary | ICD-10-CM | POA: Diagnosis not present

## 2022-09-04 DIAGNOSIS — K5900 Constipation, unspecified: Secondary | ICD-10-CM | POA: Diagnosis not present

## 2022-09-04 DIAGNOSIS — E1142 Type 2 diabetes mellitus with diabetic polyneuropathy: Secondary | ICD-10-CM | POA: Diagnosis not present

## 2022-09-04 DIAGNOSIS — Z7985 Long-term (current) use of injectable non-insulin antidiabetic drugs: Secondary | ICD-10-CM | POA: Diagnosis not present

## 2022-09-04 DIAGNOSIS — Z833 Family history of diabetes mellitus: Secondary | ICD-10-CM | POA: Diagnosis not present

## 2022-09-04 DIAGNOSIS — M1990 Unspecified osteoarthritis, unspecified site: Secondary | ICD-10-CM | POA: Diagnosis not present

## 2022-09-04 DIAGNOSIS — G4730 Sleep apnea, unspecified: Secondary | ICD-10-CM | POA: Diagnosis not present

## 2022-09-04 DIAGNOSIS — I509 Heart failure, unspecified: Secondary | ICD-10-CM | POA: Diagnosis not present

## 2022-09-04 DIAGNOSIS — I77819 Aortic ectasia, unspecified site: Secondary | ICD-10-CM | POA: Diagnosis not present

## 2022-09-04 DIAGNOSIS — H409 Unspecified glaucoma: Secondary | ICD-10-CM | POA: Diagnosis not present

## 2022-09-04 DIAGNOSIS — J309 Allergic rhinitis, unspecified: Secondary | ICD-10-CM | POA: Diagnosis not present

## 2022-09-04 DIAGNOSIS — Z85528 Personal history of other malignant neoplasm of kidney: Secondary | ICD-10-CM | POA: Diagnosis not present

## 2022-09-04 DIAGNOSIS — I771 Stricture of artery: Secondary | ICD-10-CM | POA: Diagnosis not present

## 2022-09-04 DIAGNOSIS — Z881 Allergy status to other antibiotic agents status: Secondary | ICD-10-CM | POA: Diagnosis not present

## 2022-09-04 DIAGNOSIS — Z794 Long term (current) use of insulin: Secondary | ICD-10-CM | POA: Diagnosis not present

## 2022-09-04 DIAGNOSIS — N529 Male erectile dysfunction, unspecified: Secondary | ICD-10-CM | POA: Diagnosis not present

## 2022-09-04 DIAGNOSIS — I87309 Chronic venous hypertension (idiopathic) without complications of unspecified lower extremity: Secondary | ICD-10-CM | POA: Diagnosis not present

## 2022-09-04 DIAGNOSIS — I82509 Chronic embolism and thrombosis of unspecified deep veins of unspecified lower extremity: Secondary | ICD-10-CM | POA: Diagnosis not present

## 2022-09-04 DIAGNOSIS — D6869 Other thrombophilia: Secondary | ICD-10-CM | POA: Diagnosis not present

## 2022-09-04 DIAGNOSIS — M1030 Gout due to renal impairment, unspecified site: Secondary | ICD-10-CM | POA: Diagnosis not present

## 2022-09-04 DIAGNOSIS — J811 Chronic pulmonary edema: Secondary | ICD-10-CM | POA: Diagnosis not present

## 2022-09-04 DIAGNOSIS — K219 Gastro-esophageal reflux disease without esophagitis: Secondary | ICD-10-CM | POA: Diagnosis not present

## 2022-09-04 DIAGNOSIS — E785 Hyperlipidemia, unspecified: Secondary | ICD-10-CM | POA: Diagnosis not present

## 2022-09-04 DIAGNOSIS — N400 Enlarged prostate without lower urinary tract symptoms: Secondary | ICD-10-CM | POA: Diagnosis not present

## 2022-09-04 DIAGNOSIS — R32 Unspecified urinary incontinence: Secondary | ICD-10-CM | POA: Diagnosis not present

## 2022-09-05 ENCOUNTER — Ambulatory Visit: Admission: RE | Admit: 2022-09-05 | Payer: Medicare PPO | Source: Ambulatory Visit

## 2022-09-05 DIAGNOSIS — C649 Malignant neoplasm of unspecified kidney, except renal pelvis: Secondary | ICD-10-CM | POA: Diagnosis not present

## 2022-09-05 DIAGNOSIS — C641 Malignant neoplasm of right kidney, except renal pelvis: Secondary | ICD-10-CM

## 2022-09-05 HISTORY — PX: IR RADIOLOGIST EVAL & MGMT: IMG5224
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
## 2022-09-11 DIAGNOSIS — N2581 Secondary hyperparathyroidism of renal origin: Secondary | ICD-10-CM | POA: Diagnosis not present

## 2022-09-11 DIAGNOSIS — E113493 Type 2 diabetes mellitus with severe nonproliferative diabetic retinopathy without macular edema, bilateral: Secondary | ICD-10-CM | POA: Diagnosis not present

## 2022-09-11 DIAGNOSIS — R809 Proteinuria, unspecified: Secondary | ICD-10-CM | POA: Diagnosis not present

## 2022-09-11 DIAGNOSIS — I10 Essential (primary) hypertension: Secondary | ICD-10-CM | POA: Diagnosis not present

## 2022-09-11 DIAGNOSIS — E1122 Type 2 diabetes mellitus with diabetic chronic kidney disease: Secondary | ICD-10-CM | POA: Diagnosis not present

## 2022-09-11 DIAGNOSIS — I82402 Acute embolism and thrombosis of unspecified deep veins of left lower extremity: Secondary | ICD-10-CM | POA: Diagnosis not present

## 2022-09-11 DIAGNOSIS — N1832 Chronic kidney disease, stage 3b: Secondary | ICD-10-CM | POA: Diagnosis not present

## 2022-09-11 DIAGNOSIS — I509 Heart failure, unspecified: Secondary | ICD-10-CM | POA: Diagnosis not present

## 2022-09-11 DIAGNOSIS — N281 Cyst of kidney, acquired: Secondary | ICD-10-CM | POA: Diagnosis not present

## 2022-09-18 DIAGNOSIS — D631 Anemia in chronic kidney disease: Secondary | ICD-10-CM | POA: Diagnosis not present

## 2022-09-18 DIAGNOSIS — N281 Cyst of kidney, acquired: Secondary | ICD-10-CM | POA: Diagnosis not present

## 2022-09-18 DIAGNOSIS — I82402 Acute embolism and thrombosis of unspecified deep veins of left lower extremity: Secondary | ICD-10-CM | POA: Diagnosis not present

## 2022-09-18 DIAGNOSIS — N2581 Secondary hyperparathyroidism of renal origin: Secondary | ICD-10-CM | POA: Diagnosis not present

## 2022-09-18 DIAGNOSIS — E1122 Type 2 diabetes mellitus with diabetic chronic kidney disease: Secondary | ICD-10-CM | POA: Diagnosis not present

## 2022-09-18 DIAGNOSIS — I10 Essential (primary) hypertension: Secondary | ICD-10-CM | POA: Diagnosis not present

## 2022-09-18 DIAGNOSIS — I509 Heart failure, unspecified: Secondary | ICD-10-CM | POA: Diagnosis not present

## 2022-09-18 DIAGNOSIS — R809 Proteinuria, unspecified: Secondary | ICD-10-CM | POA: Diagnosis not present

## 2022-09-18 DIAGNOSIS — N1832 Chronic kidney disease, stage 3b: Secondary | ICD-10-CM | POA: Diagnosis not present

## 2022-09-22 DIAGNOSIS — E113513 Type 2 diabetes mellitus with proliferative diabetic retinopathy with macular edema, bilateral: Secondary | ICD-10-CM | POA: Diagnosis not present

## 2022-09-22 DIAGNOSIS — H35359 Cystoid macular degeneration, unspecified eye: Secondary | ICD-10-CM | POA: Diagnosis not present

## 2022-09-22 DIAGNOSIS — H2000 Unspecified acute and subacute iridocyclitis: Secondary | ICD-10-CM | POA: Diagnosis not present

## 2022-09-22 DIAGNOSIS — E113599 Type 2 diabetes mellitus with proliferative diabetic retinopathy without macular edema, unspecified eye: Secondary | ICD-10-CM | POA: Diagnosis not present

## 2022-09-25 ENCOUNTER — Encounter: Payer: Self-pay | Admitting: Internal Medicine

## 2022-09-25 ENCOUNTER — Ambulatory Visit: Payer: Medicare PPO | Admitting: Internal Medicine

## 2022-09-25 VITALS — BP 122/84 | HR 78 | Temp 97.8°F | Ht 71.0 in | Wt 253.0 lb

## 2022-09-25 DIAGNOSIS — G479 Sleep disorder, unspecified: Secondary | ICD-10-CM | POA: Diagnosis not present

## 2022-09-25 DIAGNOSIS — Z794 Long term (current) use of insulin: Secondary | ICD-10-CM

## 2022-09-25 DIAGNOSIS — I5032 Chronic diastolic (congestive) heart failure: Secondary | ICD-10-CM | POA: Diagnosis not present

## 2022-09-25 DIAGNOSIS — E1142 Type 2 diabetes mellitus with diabetic polyneuropathy: Secondary | ICD-10-CM | POA: Diagnosis not present

## 2022-09-25 DIAGNOSIS — I82509 Chronic embolism and thrombosis of unspecified deep veins of unspecified lower extremity: Secondary | ICD-10-CM

## 2022-09-25 LAB — POCT GLYCOSYLATED HEMOGLOBIN (HGB A1C): Hemoglobin A1C: 10.7 % — AB (ref 4.0–5.6)

## 2022-09-25 MED ORDER — GLIPIZIDE ER 2.5 MG PO TB24: 2.5000 mg | ORAL_TABLET | Freq: Every day | ORAL | 3 refills | Status: DC

## 2022-09-25 NOTE — Assessment & Plan Note (Signed)
No recurrence on the eliquis 5 bid
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
## 2022-09-25 NOTE — Addendum Note (Signed)
Addended by: Eual Fines on: 09/25/2022 12:45 PM   Modules accepted: Orders

## 2022-09-25 NOTE — Assessment & Plan Note (Signed)
Trouble with nocturia Discussed taking second furosemide at lunch

## 2022-09-25 NOTE — Assessment & Plan Note (Signed)
Has not been doing well with managing carbs---discussed again what he should be avoiding A1c up to 10.7% On trulicity 3mg  weekly, lantus 50, jardiance 25 Will add back glipiizide but at lower dose--2.5mg 

## 2022-09-25 NOTE — Assessment & Plan Note (Signed)
Has not been weighing daily--but fairly stable Discussed taking the furosemide AM and lunch time

## 2022-09-26 ENCOUNTER — Other Ambulatory Visit: Payer: Self-pay | Admitting: Cardiovascular Disease

## 2022-09-26 DIAGNOSIS — I82401 Acute embolism and thrombosis of unspecified deep veins of right lower extremity: Secondary | ICD-10-CM

## 2022-09-26 NOTE — Telephone Encounter (Signed)
Eliquis 2.5mg  refill request received. Patient is 72 years old, weight-114.3kg, Crea-1.96 on 07/07/22, Diagnosis-DVT, and last seen by Dr. Kirke Corin on 05/08/22. Dose is appropriate per MD-see below. Will send in refill to requested pharmacy.    Per Dr. 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 He was seen by hematology with negative hypercoagulable workup with recommendations to decrease the dose of Eliquis to 2.5 mg twice daily. That has not happened yet and thus I went ahead and asked him to decrease the dose to 2.5 mg twice daily. This likely will be continued lifelong. "

## 2022-09-26 NOTE — Telephone Encounter (Signed)
Refill request

## 2022-09-28 ENCOUNTER — Other Ambulatory Visit: Payer: Self-pay | Admitting: Internal Medicine

## 2022-10-06 ENCOUNTER — Inpatient Hospital Stay: Payer: Medicare PPO

## 2022-10-06 ENCOUNTER — Inpatient Hospital Stay: Payer: Medicare PPO | Attending: Oncology

## 2022-10-06 DIAGNOSIS — I959 Hypotension, unspecified: Secondary | ICD-10-CM | POA: Insufficient documentation

## 2022-10-06 DIAGNOSIS — Z8379 Family history of other diseases of the digestive system: Secondary | ICD-10-CM | POA: Diagnosis not present

## 2022-10-06 DIAGNOSIS — Z7901 Long term (current) use of anticoagulants: Secondary | ICD-10-CM | POA: Insufficient documentation

## 2022-10-06 DIAGNOSIS — R600 Localized edema: Secondary | ICD-10-CM | POA: Insufficient documentation

## 2022-10-06 DIAGNOSIS — K862 Cyst of pancreas: Secondary | ICD-10-CM | POA: Diagnosis not present

## 2022-10-06 DIAGNOSIS — Z8249 Family history of ischemic heart disease and other diseases of the circulatory system: Secondary | ICD-10-CM | POA: Diagnosis not present

## 2022-10-06 DIAGNOSIS — Z886 Allergy status to analgesic agent status: Secondary | ICD-10-CM | POA: Diagnosis not present

## 2022-10-06 DIAGNOSIS — N1832 Chronic kidney disease, stage 3b: Secondary | ICD-10-CM | POA: Insufficient documentation

## 2022-10-06 DIAGNOSIS — M109 Gout, unspecified: Secondary | ICD-10-CM | POA: Insufficient documentation

## 2022-10-06 DIAGNOSIS — Z811 Family history of alcohol abuse and dependence: Secondary | ICD-10-CM | POA: Diagnosis not present

## 2022-10-06 DIAGNOSIS — Z5986 Financial insecurity: Secondary | ICD-10-CM | POA: Insufficient documentation

## 2022-10-06 DIAGNOSIS — Z833 Family history of diabetes mellitus: Secondary | ICD-10-CM | POA: Insufficient documentation

## 2022-10-06 DIAGNOSIS — Z881 Allergy status to other antibiotic agents status: Secondary | ICD-10-CM | POA: Diagnosis not present

## 2022-10-06 DIAGNOSIS — D631 Anemia in chronic kidney disease: Secondary | ICD-10-CM | POA: Diagnosis not present

## 2022-10-06 DIAGNOSIS — C641 Malignant neoplasm of right kidney, except renal pelvis: Secondary | ICD-10-CM | POA: Insufficient documentation

## 2022-10-06 DIAGNOSIS — Z79899 Other long term (current) drug therapy: Secondary | ICD-10-CM | POA: Diagnosis not present

## 2022-10-06 DIAGNOSIS — E1122 Type 2 diabetes mellitus with diabetic chronic kidney disease: Secondary | ICD-10-CM | POA: Insufficient documentation

## 2022-10-06 DIAGNOSIS — M7989 Other specified soft tissue disorders: Secondary | ICD-10-CM | POA: Insufficient documentation

## 2022-10-06 DIAGNOSIS — I129 Hypertensive chronic kidney disease with stage 1 through stage 4 chronic kidney disease, or unspecified chronic kidney disease: Secondary | ICD-10-CM | POA: Insufficient documentation

## 2022-10-06 DIAGNOSIS — Z87891 Personal history of nicotine dependence: Secondary | ICD-10-CM | POA: Diagnosis not present

## 2022-10-06 DIAGNOSIS — Z8719 Personal history of other diseases of the digestive system: Secondary | ICD-10-CM | POA: Insufficient documentation

## 2022-10-06 DIAGNOSIS — I82402 Acute embolism and thrombosis of unspecified deep veins of left lower extremity: Secondary | ICD-10-CM | POA: Diagnosis not present

## 2022-10-06 LAB — CMP (CANCER CENTER ONLY)
ALT: 25 U/L (ref 0–44)
AST: 28 U/L (ref 15–41)
Albumin: 3.3 g/dL — ABNORMAL LOW (ref 3.5–5.0)
Alkaline Phosphatase: 71 U/L (ref 38–126)
Anion gap: 8 (ref 5–15)
BUN: 29 mg/dL — ABNORMAL HIGH (ref 8–23)
CO2: 21 mmol/L — ABNORMAL LOW (ref 22–32)
Calcium: 8.9 mg/dL (ref 8.9–10.3)
Chloride: 108 mmol/L (ref 98–111)
Creatinine: 1.92 mg/dL — ABNORMAL HIGH (ref 0.61–1.24)
GFR, Estimated: 36 mL/min — ABNORMAL LOW (ref >60)
Glucose, Bld: 127 mg/dL — ABNORMAL HIGH (ref 70–99)
Potassium: 3.5 mmol/L (ref 3.5–5.1)
Sodium: 137 mmol/L (ref 135–145)
Total Bilirubin: 0.4 mg/dL (ref 0.3–1.2)
Total Protein: 7.8 g/dL (ref 6.5–8.1)

## 2022-10-06 LAB — CBC WITH DIFFERENTIAL (CANCER CENTER ONLY)
Abs Immature Granulocytes: 0.01 10*3/uL (ref 0.00–0.07)
Basophils Absolute: 0 10*3/uL (ref 0.0–0.1)
Basophils Relative: 1 %
Eosinophils Absolute: 0.3 10*3/uL (ref 0.0–0.5)
Eosinophils Relative: 5 %
HCT: 38.2 % — ABNORMAL LOW (ref 39.0–52.0)
Hemoglobin: 12 g/dL — ABNORMAL LOW (ref 13.0–17.0)
Immature Granulocytes: 0 %
Lymphocytes Relative: 54 %
Lymphs Abs: 3.2 10*3/uL (ref 0.7–4.0)
MCH: 27.8 pg (ref 26.0–34.0)
MCHC: 31.4 g/dL (ref 30.0–36.0)
MCV: 88.4 fL (ref 80.0–100.0)
Monocytes Absolute: 0.5 10*3/uL (ref 0.1–1.0)
Monocytes Relative: 9 %
Neutro Abs: 1.8 10*3/uL (ref 1.7–7.7)
Neutrophils Relative %: 31 %
Platelet Count: 211 10*3/uL (ref 150–400)
RBC: 4.32 MIL/uL (ref 4.22–5.81)
RDW: 15.9 % — ABNORMAL HIGH (ref 11.5–15.5)
WBC Count: 5.9 10*3/uL (ref 4.0–10.5)
nRBC: 0 % (ref 0.0–0.2)

## 2022-10-06 LAB — IRON AND TIBC
Iron: 101 ug/dL (ref 45–182)
Saturation Ratios: 50 % — ABNORMAL HIGH (ref 17.9–39.5)
TIBC: 202 ug/dL — ABNORMAL LOW (ref 250–450)
UIBC: 101 ug/dL

## 2022-10-06 LAB — FERRITIN: Ferritin: 72 ng/mL (ref 24–336)

## 2022-10-07 ENCOUNTER — Inpatient Hospital Stay: Payer: Medicare PPO | Admitting: Oncology

## 2022-10-07 ENCOUNTER — Encounter: Payer: Self-pay | Admitting: Oncology

## 2022-10-07 VITALS — BP 175/105 | HR 70 | Temp 96.8°F | Resp 18 | Wt 253.6 lb

## 2022-10-07 DIAGNOSIS — C641 Malignant neoplasm of right kidney, except renal pelvis: Secondary | ICD-10-CM

## 2022-10-07 DIAGNOSIS — D631 Anemia in chronic kidney disease: Secondary | ICD-10-CM

## 2022-10-07 DIAGNOSIS — I82401 Acute embolism and thrombosis of unspecified deep veins of right lower extremity: Secondary | ICD-10-CM | POA: Diagnosis not present

## 2022-10-07 DIAGNOSIS — R600 Localized edema: Secondary | ICD-10-CM | POA: Diagnosis not present

## 2022-10-07 DIAGNOSIS — I129 Hypertensive chronic kidney disease with stage 1 through stage 4 chronic kidney disease, or unspecified chronic kidney disease: Secondary | ICD-10-CM | POA: Diagnosis not present

## 2022-10-07 DIAGNOSIS — K862 Cyst of pancreas: Secondary | ICD-10-CM | POA: Diagnosis not present

## 2022-10-07 DIAGNOSIS — M7989 Other specified soft tissue disorders: Secondary | ICD-10-CM | POA: Diagnosis not present

## 2022-10-07 DIAGNOSIS — N1832 Chronic kidney disease, stage 3b: Secondary | ICD-10-CM

## 2022-10-07 DIAGNOSIS — I959 Hypotension, unspecified: Secondary | ICD-10-CM | POA: Diagnosis not present

## 2022-10-07 DIAGNOSIS — I82402 Acute embolism and thrombosis of unspecified deep veins of left lower extremity: Secondary | ICD-10-CM | POA: Diagnosis not present

## 2022-10-07 NOTE — Assessment & Plan Note (Signed)
#  unprovoked acute lower extremity DVT.  Negative prothrombin gene and factor V Leiden mutation, negative APS antibodies..Repeat left lower extremity DVT.  Continue Eliquis 2.5mg  BID for long term prophylaxis.   

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
## 2022-10-07 NOTE — Assessment & Plan Note (Addendum)
CA 19.9 normal. 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  Refer to GI for EUS evaluation. Discussed case with RN navigator [place]
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
## 2022-10-07 NOTE — Assessment & Plan Note (Signed)
Stable Cr.  Avoid nephrotoxins and encourage oral hydration.  

## 2022-10-07 NOTE — Assessment & Plan Note (Addendum)
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  Continue annual surveillance.

## 2022-10-07 NOTE — Assessment & Plan Note (Addendum)
Hemoglobin has decreased. I did iron panel showed Lab Results  Component Value Date   HGB 12.0 (L) 10/06/2022   TIBC 202 (L) 10/06/2022   IRONPCTSAT 50 (H) 10/06/2022   FERRITIN 72 10/06/2022    Hemoglobin has improved I recommend ferrous sulfate, decrease to 325 mg daily.

## 2022-10-09 ENCOUNTER — Telehealth: Payer: Self-pay

## 2022-10-09 ENCOUNTER — Other Ambulatory Visit: Payer: Self-pay

## 2022-10-09 NOTE — Telephone Encounter (Signed)
Received referral for EUS from Dr. Cathie Hoops for evaluation of enlarging pancreas cyst. Scheduled for 10/16/22. Called and reviewed instructions with spouse, Lanora Manis. Copy of instructions sent to mychart and also mailed to home address. He will take his Eliquis on August 19 then hold. Provided my contact information for any further needs.

## 2022-10-16 ENCOUNTER — Ambulatory Visit
Admission: RE | Admit: 2022-10-16 | Discharge: 2022-10-16 | Disposition: A | Discharge status: Home / Self Care | Payer: Medicare PPO | Source: Ambulatory Visit | Attending: Gastroenterology | Admitting: Gastroenterology

## 2022-10-16 ENCOUNTER — Encounter: Payer: Self-pay | Admitting: Gastroenterology

## 2022-10-16 ENCOUNTER — Encounter: Payer: Self-pay | Admitting: Anesthesiology

## 2022-10-16 ENCOUNTER — Other Ambulatory Visit: Payer: Self-pay

## 2022-10-16 ENCOUNTER — Other Ambulatory Visit: Payer: Medicare PPO

## 2022-10-16 ENCOUNTER — Encounter
Admission: RE | Disposition: A | Discharge status: Home / Self Care | Payer: Self-pay | Source: Ambulatory Visit | Attending: Gastroenterology

## 2022-10-16 ENCOUNTER — Encounter: Admission: RE | Payer: Self-pay | Source: Home / Self Care

## 2022-10-16 ENCOUNTER — Ambulatory Visit: Admission: RE | Admit: 2022-10-16 | Payer: Medicare PPO | Source: Home / Self Care | Admitting: Gastroenterology

## 2022-10-16 ENCOUNTER — Telehealth: Payer: Self-pay

## 2022-10-16 DIAGNOSIS — K862 Cyst of pancreas: Secondary | ICD-10-CM | POA: Insufficient documentation

## 2022-10-16 DIAGNOSIS — Z539 Procedure and treatment not carried out, unspecified reason: Secondary | ICD-10-CM | POA: Diagnosis not present

## 2022-10-16 LAB — GLUCOSE, CAPILLARY: Glucose-Capillary: 119 mg/dL — ABNORMAL HIGH (ref 70–99)

## 2022-10-16 SURGERY — UPPER ENDOSCOPIC ULTRASOUND (EUS) LINEAR
Anesthesia: General

## 2022-10-16 MED ORDER — SODIUM CHLORIDE 0.9 % IV SOLN: INTRAVENOUS | Status: DC

## 2022-10-16 NOTE — Telephone Encounter (Signed)
Notified by Endoscopy that Mr. Laursen did not hold his Eliquis as instructed. Procedure cancelled and rescheduled to 10/30/22. Will speak to Mrs. Deuschle again and provide repeat instructions. A new copy will be sent to [facility] and mailed to home address. Eliquis to be taken on 10/27/22 then held.

## 2022-10-17 ENCOUNTER — Other Ambulatory Visit: Payer: Self-pay | Admitting: Internal Medicine

## 2022-10-20 ENCOUNTER — Telehealth: Payer: Self-pay

## 2022-10-20 ENCOUNTER — Other Ambulatory Visit: Payer: Self-pay | Admitting: Oncology

## 2022-10-20 NOTE — Telephone Encounter (Signed)
EUS was rescheduled to October 30, 2022. EUS instructions have been verbally instructed on, emailed and a copy mailed to home address. Additional medication instructions per below were added to instructions.   Hold further Trulicity since it was taken 8/25, Sunday (If this is cleared by diabetic physician) Take Eliquis on September 2, then hold it. Hold Jardiance the day of procedure, September 5.

## 2022-10-23 ENCOUNTER — Encounter: Payer: Self-pay | Admitting: Internal Medicine

## 2022-10-30 ENCOUNTER — Other Ambulatory Visit: Payer: Self-pay

## 2022-10-30 ENCOUNTER — Ambulatory Visit
Admission: RE | Admit: 2022-10-30 | Discharge: 2022-10-30 | Disposition: A | Discharge status: Home / Self Care | Payer: Medicare PPO | Attending: Internal Medicine | Admitting: Internal Medicine

## 2022-10-30 ENCOUNTER — Encounter: Payer: Medicare PPO | Admitting: Pharmacist

## 2022-10-30 ENCOUNTER — Ambulatory Visit: Payer: Medicare PPO | Admitting: Anesthesiology

## 2022-10-30 ENCOUNTER — Encounter
Admission: RE | Disposition: A | Discharge status: Home / Self Care | Payer: Self-pay | Source: Home / Self Care | Attending: Internal Medicine

## 2022-10-30 ENCOUNTER — Encounter: Payer: Self-pay | Admitting: Internal Medicine

## 2022-10-30 DIAGNOSIS — Z85528 Personal history of other malignant neoplasm of kidney: Secondary | ICD-10-CM | POA: Insufficient documentation

## 2022-10-30 DIAGNOSIS — Z7901 Long term (current) use of anticoagulants: Secondary | ICD-10-CM | POA: Insufficient documentation

## 2022-10-30 DIAGNOSIS — E1122 Type 2 diabetes mellitus with diabetic chronic kidney disease: Secondary | ICD-10-CM | POA: Insufficient documentation

## 2022-10-30 DIAGNOSIS — Z87891 Personal history of nicotine dependence: Secondary | ICD-10-CM | POA: Insufficient documentation

## 2022-10-30 DIAGNOSIS — I252 Old myocardial infarction: Secondary | ICD-10-CM | POA: Diagnosis not present

## 2022-10-30 DIAGNOSIS — N1832 Chronic kidney disease, stage 3b: Secondary | ICD-10-CM | POA: Insufficient documentation

## 2022-10-30 DIAGNOSIS — K862 Cyst of pancreas: Secondary | ICD-10-CM | POA: Insufficient documentation

## 2022-10-30 DIAGNOSIS — I129 Hypertensive chronic kidney disease with stage 1 through stage 4 chronic kidney disease, or unspecified chronic kidney disease: Secondary | ICD-10-CM | POA: Diagnosis not present

## 2022-10-30 DIAGNOSIS — G4730 Sleep apnea, unspecified: Secondary | ICD-10-CM | POA: Diagnosis not present

## 2022-10-30 DIAGNOSIS — I209 Angina pectoris, unspecified: Secondary | ICD-10-CM | POA: Insufficient documentation

## 2022-10-30 DIAGNOSIS — Z86718 Personal history of other venous thrombosis and embolism: Secondary | ICD-10-CM | POA: Insufficient documentation

## 2022-10-30 HISTORY — PX: EUS: SHX5427

## 2022-10-30 LAB — GLUCOSE, CAPILLARY: Glucose-Capillary: 79 mg/dL (ref 70–99)

## 2022-10-30 SURGERY — UPPER ENDOSCOPIC ULTRASOUND (EUS) LINEAR
Anesthesia: General

## 2022-10-30 MED ORDER — PROPOFOL 10 MG/ML IV BOLUS
INTRAVENOUS | Status: DC | PRN
  Administered 2022-10-30: 60 mg via INTRAVENOUS

## 2022-10-30 MED ORDER — SODIUM CHLORIDE 0.9 % IV SOLN: INTRAVENOUS | Status: DC

## 2022-10-30 MED ORDER — LIDOCAINE HCL (CARDIAC) PF 100 MG/5ML IV SOSY
PREFILLED_SYRINGE | INTRAVENOUS | Status: DC | PRN
  Administered 2022-10-30: 60 mg via INTRAVENOUS

## 2022-10-30 MED ORDER — PROPOFOL 500 MG/50ML IV EMUL
INTRAVENOUS | Status: DC | PRN
  Administered 2022-10-30: 125 ug/kg/min via INTRAVENOUS

## 2022-10-30 NOTE — Interval H&P Note (Signed)
History and Physical Interval Note:  10/30/2022 12:50 PM  Adam [name]  has presented today for surgery, with the diagnosis of Pancreas cyst.  The various methods of treatment have been discussed with the patient and family. After consideration of risks, benefits and other options for treatment, the patient has consented to  Procedure(s) with comments: UPPER ENDOSCOPIC ULTRASOUND (EUS) LINEAR (N/A) - LAB as a surgical intervention.  The patient's history has been reviewed, patient examined, no change in status, stable for surgery.  I have reviewed the patient's chart and labs.  Questions were answered to the patient's satisfaction.     Mike Gip

## 2022-10-30 NOTE — Discharge Instructions (Signed)
Discharge to home °
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
## 2022-10-30 NOTE — Transfer of Care (Signed)
Immediate Anesthesia Transfer of Care Note  Patient: Adam [name]  Procedure(s) Performed: UPPER ENDOSCOPIC ULTRASOUND (EUS) LINEAR  Patient Location: PACU  Anesthesia Type:General  Level of Consciousness: drowsy  Airway & Oxygen Therapy: Patient Spontanous Breathing  Post-op Assessment: Report given to RN and Post -op Vital signs reviewed and stable  Post vital signs: stable  Last Vitals:  Vitals Value Taken Time  BP    Temp    Pulse    Resp    SpO2      Last Pain:  Vitals:   10/30/22 1237  TempSrc: Temporal  PainSc: 0-No pain         Complications: No notable events documented.

## 2022-10-30 NOTE — Anesthesia Postprocedure Evaluation (Signed)
Anesthesia Post Note  Patient: Adam [name]  Procedure(s) Performed: UPPER ENDOSCOPIC ULTRASOUND (EUS) LINEAR  Patient location during evaluation: Endoscopy Anesthesia Type: General Level of consciousness: awake and alert Pain management: pain level controlled Vital Signs Assessment: post-procedure vital signs reviewed and stable Respiratory status: spontaneous breathing, nonlabored ventilation, respiratory function stable and patient connected to nasal cannula oxygen Cardiovascular status: blood pressure returned to baseline and stable Postop Assessment: no apparent nausea or vomiting Anesthetic complications: no   No notable events documented.   Last Vitals:  Vitals:   10/30/22 1319 10/30/22 1329  BP: 128/88 (!) 149/91  Pulse: 89 84  Resp: 20 15  Temp: (!) 35.6 C   SpO2: 97% 100%    Last Pain:  Vitals:   10/30/22 1329  TempSrc:   PainSc: 0-No pain                 Cleda Mccreedy Engelbert Sevin

## 2022-10-30 NOTE — Op Note (Signed)
[facility] Patient Name: Adam [name] Procedure Date: 10/30/2022 12:47 PM MRN: [id number] Account #: [id number] Date of Birth: 04-26-1950 Admit Type: Outpatient Age: 72 Room: [facility] ENDO ROOM 3 Gender: Male Note Status: Finalized Instrument Name: Upper Endoscope [id number] EUS Scope [id number] Procedure:             Upper EUS Indications:           Pancreatic cyst on MRI Patient Profile:       Refer to note in patient chart for documentation of                         history and physical. Providers:             Prudencio Pair. Zamya Culhane Referring MD:          [name] (Referring MD), [name], MD                         (Referring MD) Medicines:             Propofol per Anesthesia Complications:         No immediate complications. Procedure:             Pre-Anesthesia Assessment:                        Prior to the procedure, a History and Physical was                         performed, and patient medications and allergies were                         reviewed. The patient is competent. The risks and                         benefits of the procedure and the sedation options and                         risks were discussed with the patient. All questions                         were answered and informed consent was obtained.                         Patient identification and proposed procedure were                         verified by the physician, the nurse and the                         anesthesiologist in the pre-procedure area. Mental                         Status Examination: alert and oriented. Airway                         Examination: normal oropharyngeal airway and neck                         mobility. Respiratory Examination: clear to  auscultation. CV Examination: normal. Prophylactic                         Antibiotics: The patient does not require prophylactic                         antibiotics.  Prior Anticoagulants: The patient has                         taken Eliquis (apixaban), last dose was 3 days prior                         to procedure. ASA Grade Assessment: III - A patient                         with severe systemic disease. After reviewing the                         risks and benefits, the patient was deemed in                         satisfactory condition to undergo the procedure. The                         anesthesia plan was to use monitored anesthesia care                         (MAC). Immediately prior to administration of                         medications, the patient was re-assessed for adequacy                         to receive sedatives. The heart rate, respiratory                         rate, oxygen saturations, blood pressure, adequacy of                         pulmonary ventilation, and response to care were                         monitored throughout the procedure. The physical                         status of the patient was re-assessed after the                         procedure.                        After obtaining informed consent, the endoscope was                         passed under direct vision. Throughout the procedure,                         the patient's blood pressure, pulse, and oxygen  saturations were monitored continuously. The Endoscope                         was introduced through the mouth, and advanced to the                         second part of duodenum. The Endoscope was introduced                         through the mouth, and advanced to the duodenum for                         ultrasound examination from the esophagus, stomach and                         duodenum. The upper EUS was accomplished without                         difficulty. The patient tolerated the procedure well. Findings:      ENDOSCOPIC FINDING: :      The examined esophagus was endoscopically normal.      The entire  examined stomach was endoscopically normal.      The ampulla, duodenal bulb, first portion of the duodenum and second       portion of the duodenum were normal.      ENDOSONOGRAPHIC FINDING: :      An anechoic lesion suggestive of a cyst was identified in the neck/body       of the pancreas. It communicates with the pancreatic duct. The lesion       measured 15 mm by 14 mm in maximal cross-sectional diameter. There was a       single compartment without septae. The outer wall of the lesion was not       seen. There was no associated mass. There was no internal debris within       the fluid-filled cavity.      The pancreatic duct had a dilated endosonographic appearance in the body       of the pancreas (6.9 mm) and tail of the pancreas (4.9 mm). The       pancreatic duct measured normal caliber in the head (2.2 mm) and neck       (1.2 mm).      There was otherwise no sign of significant endosonographic abnormality       in the pancreatic head, genu of the pancreas, pancreatic body and       pancreatic tail.      There was no sign of significant endosonographic abnormality in the       common bile duct (2.1 mm) and in the common hepatic duct (3.0 mm).      No lymphadenopathy seen.      Endosonographic imaging in the left lobe of the liver showed no       abnormalities.      The celiac region was visualized and showed no sign of significant       endosonographic abnormality. Impression:            EGD Impressions:                        - Normal esophagus.                        -  Normal stomach.                        - Normal ampulla, duodenal bulb, first portion of the                         duodenum and second portion of the duodenum.                        EUS Impressions:                        - A cystic lesion was seen in the neck/body of the                         pancreas with upstream pancreas duct dilation. No                         solid mass component of the cyst seen.  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 FNA not performed given size of                         cyst < 2 cm with no mural nodularity or mass component                         to the cyst.                        - There was otherwise no sign of significant pathology                         in the pancreatic head, genu of the pancreas,                         pancreatic body and pancreatic tail.                        - There was no sign of significant pathology in the                         common bile duct and in the common hepatic duct.                        - No lymphadenopathy visualized.                        - Normal visualized portions of the liver.                        - Normal celiac region.                        -  No specimens collected. Recommendation:        - Discharge patient to home (ambulatory).                        - Recommend a surveillance MRI/MRCP in 6 months with                         consideration of a referral to a pancreas surgeon if                         the cyst is enlarging.                        - May resume Eliquis today.                        - The findings and recommendations were discussed with                         the patient and his family.                        - Return to referring physician as previously                         scheduled. Procedure Code(s):     --- Professional ---                        [phone], Esophagogastroduodenoscopy, flexible,                         transoral; with endoscopic ultrasound examination,                         including the esophagus, stomach, and either the                         duodenum or a surgically altered stomach where the                         jejunum is examined distal to the anastomosis Diagnosis Code(s):     --- Professional ---                         K86.2, Cyst of pancreas                        R93.3, Abnormal findings on diagnostic imaging of                         other parts of digestive tract CPT copyright 2022 [employer] Association. All rights reserved. The codes documented in this report are preliminary and upon coder review may  be revised to meet current compliance requirements. Attending Participation:      I personally performed the entire procedure without the assistance of a       fellow, resident or surgical assistant. Prudencio Pair Tywone Bembenek,  10/30/2022 1:23:40 PM This report has been signed electronically. Number of Addenda: 0 Note Initiated On: 10/30/2022 12:47 PM Estimated Blood Loss:  Estimated blood loss: none.  [facility]

## 2022-10-31 ENCOUNTER — Encounter: Payer: Self-pay | Admitting: Internal Medicine

## 2022-11-03 NOTE — Group Note (Deleted)

## 2022-11-07 ENCOUNTER — Telehealth: Payer: Self-pay

## 2022-11-07 MED ORDER — ACCU-CHEK SOFTCLIX LANCETS MISC: 12 refills | Status: AC

## 2022-11-07 NOTE — Telephone Encounter (Signed)
Rx sent electronically.

## 2022-11-13 ENCOUNTER — Ambulatory Visit: Payer: Medicare PPO | Attending: Cardiovascular Disease | Admitting: Cardiovascular Disease

## 2022-11-13 ENCOUNTER — Encounter: Payer: Self-pay | Admitting: Cardiovascular Disease

## 2022-11-13 VITALS — BP 146/60 | HR 96 | Ht 71.5 in | Wt 255.5 lb

## 2022-11-13 DIAGNOSIS — E785 Hyperlipidemia, unspecified: Secondary | ICD-10-CM

## 2022-11-13 DIAGNOSIS — Z86718 Personal history of other venous thrombosis and embolism: Secondary | ICD-10-CM

## 2022-11-13 DIAGNOSIS — I77810 Thoracic aortic ectasia: Secondary | ICD-10-CM

## 2022-11-13 DIAGNOSIS — I5032 Chronic diastolic (congestive) heart failure: Secondary | ICD-10-CM
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
## 2022-11-13 NOTE — Patient Instructions (Signed)
Medication Instructions:  No changes *If you need a refill on your cardiac medications before your next appointment, please call your pharmacy*   Lab Work: None ordered If you have labs (blood work) drawn today and your tests are completely normal, you will receive your results only by: MyChart Message (if you have MyChart) OR A paper copy in the mail If you have any lab test that is abnormal or we need to change your treatment, we will call you to review the results.   Testing/Procedures: None ordered   Follow-Up: At [facility], you and your health needs are our priority.  As part of our continuing mission to provide you with exceptional heart care, we have created designated Provider Care Teams.  These Care Teams include your primary Cardiologist (physician) and Advanced Practice Providers (APPs -  Physician Assistants and Nurse Practitioners) who all work together to provide you with the care you need, when you need it.  We recommend signing up for the patient portal called "MyChart".  Sign up information is provided on this After Visit Summary.  MyChart is used to connect with patients for Virtual Visits (Telemedicine).  Patients are able to view lab/test results, encounter notes, upcoming appointments, etc.  Non-urgent messages can be sent to your provider as well.   To learn more about what you can do with MyChart, go to [email or web address].    Your next appointment:   12 month(s)  Provider:   You may see Dr. Kirke Corin or one of the following Advanced Practice Providers on your designated Care Team:   Nicolasa Ducking, NP Eula Listen, PA-C Cadence Fransico Michael, PA-C Charlsie Quest, NP

## 2022-11-30 ENCOUNTER — Other Ambulatory Visit: Payer: Self-pay | Admitting: Internal Medicine

## 2022-12-03 DIAGNOSIS — I509 Heart failure, unspecified: Secondary | ICD-10-CM | POA: Diagnosis not present

## 2022-12-03 DIAGNOSIS — N1832 Chronic kidney disease, stage 3b: Secondary | ICD-10-CM | POA: Diagnosis not present

## 2022-12-03 DIAGNOSIS — N2581 Secondary hyperparathyroidism of renal origin: Secondary | ICD-10-CM | POA: Diagnosis not present

## 2022-12-03 DIAGNOSIS — E113493 Type 2 diabetes mellitus with severe nonproliferative diabetic retinopathy without macular edema, bilateral: Secondary | ICD-10-CM | POA: Diagnosis not present

## 2022-12-03 DIAGNOSIS — I10 Essential (primary) hypertension: Secondary | ICD-10-CM | POA: Diagnosis not present

## 2022-12-03 DIAGNOSIS — D631 Anemia in chronic kidney disease: Secondary | ICD-10-CM | POA: Diagnosis not present

## 2022-12-03 DIAGNOSIS — R809 Proteinuria, unspecified: Secondary | ICD-10-CM | POA: Diagnosis not present

## 2022-12-03 DIAGNOSIS — N281 Cyst of kidney, acquired: Secondary | ICD-10-CM | POA: Diagnosis not present

## 2022-12-03 DIAGNOSIS — E1122 Type 2 diabetes mellitus with diabetic chronic kidney disease: Secondary | ICD-10-CM | POA: Diagnosis not present

## 2022-12-03 DIAGNOSIS — I82402 Acute embolism and thrombosis of unspecified deep veins of left lower extremity: Secondary | ICD-10-CM | POA: Diagnosis not present

## 2022-12-06 ENCOUNTER — Other Ambulatory Visit: Payer: Self-pay | Admitting: Internal Medicine

## 2022-12-06 DIAGNOSIS — M1000 Idiopathic gout, unspecified site: Secondary | ICD-10-CM

## 2022-12-29 ENCOUNTER — Encounter: Payer: Self-pay | Admitting: Internal Medicine

## 2022-12-29 ENCOUNTER — Ambulatory Visit: Payer: Medicare PPO | Admitting: Internal Medicine

## 2022-12-29 VITALS — BP 128/88 | HR 77 | Temp 98.4°F | Ht 71.5 in | Wt 254.0 lb

## 2022-12-29 DIAGNOSIS — Z7984 Long term (current) use of oral hypoglycemic drugs: Secondary | ICD-10-CM | POA: Diagnosis not present

## 2022-12-29 DIAGNOSIS — M1000 Idiopathic gout, unspecified site: Secondary | ICD-10-CM | POA: Diagnosis not present

## 2022-12-29 DIAGNOSIS — I5032 Chronic diastolic (congestive) heart failure: Secondary | ICD-10-CM

## 2022-12-29 DIAGNOSIS — K5900 Constipation, unspecified: Secondary | ICD-10-CM | POA: Diagnosis not present

## 2022-12-29 DIAGNOSIS — E1142 Type 2 diabetes mellitus with diabetic polyneuropathy: Secondary | ICD-10-CM

## 2022-12-29 DIAGNOSIS — Z7985 Long-term (current) use of injectable non-insulin antidiabetic drugs: Secondary | ICD-10-CM | POA: Insufficient documentation

## 2022-12-29 DIAGNOSIS — Z794 Long term (current) use of insulin: Secondary | ICD-10-CM | POA: Insufficient documentation

## 2022-12-29 LAB — POCT GLYCOSYLATED HEMOGLOBIN (HGB A1C): Hemoglobin A1C: 10 % — AB (ref 4.0–5.6)

## 2022-12-29 MED ORDER — GLIPIZIDE ER 5 MG PO TB24: 5.0000 mg | ORAL_TABLET | Freq: Every day | ORAL | 3 refills | Status: DC

## 2022-12-29 MED ORDER — ALLOPURINOL 100 MG PO TABS: 100.0000 mg | ORAL_TABLET | Freq: Every day | ORAL | 3 refills | Status: DC

## 2022-12-29 NOTE — Patient Instructions (Signed)
Please take the miralax every day ----1 capful in a glass of water (or other drink).  Increase the glipizide to 5mg  daily (two of the 2.5mg  glipizide till they run out--then I sent a new prescription)  Please buckle down and eat healthier

## 2022-12-29 NOTE — Assessment & Plan Note (Signed)
Has worsened Will increase the allopurinol to 100mg  daily Colchicine for prn

## 2022-12-29 NOTE — Assessment & Plan Note (Signed)
Needs to take the miralax every day

## 2022-12-29 NOTE — Assessment & Plan Note (Signed)
Lab Results  Component Value Date   HGBA1C 10.0 (A) 12/29/2022   Better but still not acceptable Will increase the glipizide to 5mg  daily Continue trulicity 3mg  weekly, jardiance 25, lantus 50 units daily

## 2022-12-29 NOTE — Assessment & Plan Note (Signed)
Seems to be compensated on the jardiance, furosemide 20 bid and lisinopril 20 daily
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
## 2022-12-31 ENCOUNTER — Other Ambulatory Visit: Payer: Self-pay | Admitting: Internal Medicine

## 2023-01-19 ENCOUNTER — Other Ambulatory Visit: Payer: Self-pay | Admitting: Internal Medicine

## 2023-01-26 DIAGNOSIS — E113513 Type 2 diabetes mellitus with proliferative diabetic retinopathy with macular edema, bilateral: Secondary | ICD-10-CM | POA: Diagnosis not present

## 2023-01-26 DIAGNOSIS — Z961 Presence of intraocular lens: Secondary | ICD-10-CM | POA: Diagnosis not present

## 2023-01-26 DIAGNOSIS — H2000 Unspecified acute and subacute iridocyclitis: Secondary | ICD-10-CM | POA: Diagnosis not present

## 2023-02-12 ENCOUNTER — Other Ambulatory Visit: Payer: Self-pay | Admitting: Cardiovascular Disease

## 2023-02-12 DIAGNOSIS — I82401 Acute embolism and thrombosis of unspecified deep veins of right lower extremity: Secondary | ICD-10-CM

## 2023-02-12 NOTE — Telephone Encounter (Signed)
Refill request

## 2023-02-12 NOTE — Telephone Encounter (Signed)
Prescription refill request for Eliquis received. Indication: DVT  Last office visit: 11/13/22 Adam [name])  Scr: 2.18 (12/03/22)  Age: 72 Weight: 115.2kg  Per Dr. 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 He was seen by hematology with negative hypercoagulable workup with recommendations to decrease the dose of Eliquis to 2.5 mg twice daily. That has not happened yet and thus I went ahead and asked him to decrease the dose to 2.5 mg twice daily. This likely will be continued lifelong. "   Refill sent.

## 2023-02-23 ENCOUNTER — Ambulatory Visit: Payer: Self-pay | Admitting: Internal Medicine

## 2023-02-23 NOTE — Telephone Encounter (Signed)
Noted. Will evaluate in office  

## 2023-02-23 NOTE — Telephone Encounter (Signed)
Sending to Audria Nine, NP and Dr Alphonsus Sias as Adam [name].

## 2023-02-23 NOTE — Telephone Encounter (Signed)
Copied from CRM [id number]. Topic: Clinical - Red Word Triage >> Feb 23, 2023  9:51 AM Adam [name] wrote: Swollen painful bump on back of head/neck area- Not fall related.   Chief Complaint: Painful lump on neck Symptoms: Pain and itching Frequency: A week Pertinent Negatives: Patient denies fever and SOB Disposition: [] ED /[] Urgent Care (no appt availability in office) / [x] Appointment(In office/virtual)/ []  [facility]/ [] Home Care/ [] Refused Recommended Disposition /[] [facility]/ []  Follow-up with PCP Additional Notes: Patient called in complaining of pain localized to a lump on his neck. Patient presents with two lumps. The first lump has been present for awhile and the new lump appeared a week ago. Patient stated that the pain related to the lumps is a new onset. Patient states pain is severe and wakes him from sleep. Patient denies fever and SOB. Patient stated that he has been seen in the office for the first lump, but this is the first time the lumps have started to bother him. Scheduled patient for appointment in office tomorrow, 12/31. Advised patient to call back if symptoms worsen and use home remedies such as hydrocortisone cream and Benadryl at night.    Reason for Disposition  [1] Small swelling or lump AND [2] unexplained AND [3] present > 1 week  Answer Assessment - Initial Assessment Questions 1. APPEARANCE of SWELLING: "What does it look like?"     Red and raised, 2 lumps, painful to the touch  2. SIZE: "How large is the swelling?" (e.g., inches, cm; or compare to size of pinhead, tip of pen, eraser, coin, pea, grape, ping pong ball)      One lump is size of a quarter and one next to it is size of a nickel  3. LOCATION: "Where is the swelling located?"     Left neck  4. ONSET: "When did the swelling start?"     First lump appeared a long time ago, second lump appeared about a week ago   5. COLOR: "What color is it?" "Is there more than one  color?"     Red  6. PAIN: "Is there any pain?" If Yes, ask: "How bad is the pain?" (e.g., scale 1-10; or mild, moderate, severe)     - NONE (0): no pain   - MILD (1-3): doesn't interfere with normal activities    - MODERATE (4-7): interferes with normal activities or awakens from sleep    - SEVERE (8-10): excruciating pain, unable to do any normal activities     8- trouble sleeping  7. ITCH: "Does it itch?" If Yes, ask: "How bad is the itch?"      Yes- itching feels internal  9 OTHER SYMPTOMS: "Do you have any other symptoms?" (e.g., fever)     Difficulting sleeping, denies others  Protocols used: Skin Lump or Localized Swelling-A-AH

## 2023-02-23 NOTE — Telephone Encounter (Signed)
Glad he could be seen

## 2023-02-24 ENCOUNTER — Encounter: Payer: Self-pay | Admitting: Nurse Practitioner

## 2023-02-24 ENCOUNTER — Ambulatory Visit: Payer: Medicare PPO | Admitting: Nurse Practitioner

## 2023-02-24 VITALS — BP 138/82 | HR 75 | Temp 97.8°F | Ht 71.5 in | Wt 254.0 lb

## 2023-02-24 DIAGNOSIS — U071 COVID-19: Secondary | ICD-10-CM | POA: Diagnosis not present

## 2023-02-24 DIAGNOSIS — L0291 Cutaneous abscess, unspecified: Secondary | ICD-10-CM | POA: Diagnosis not present

## 2023-02-24 DIAGNOSIS — R0981 Nasal congestion: Secondary | ICD-10-CM

## 2023-02-24 LAB — POC COVID19 BINAXNOW: SARS Coronavirus 2 Ag: POSITIVE — AB

## 2023-02-24 MED ORDER — CETIRIZINE HCL 10 MG PO TABS: 10.0000 mg | ORAL_TABLET | Freq: Every day | ORAL | 0 refills | Status: DC

## 2023-02-24 MED ORDER — MOLNUPIRAVIR EUA 200MG CAPSULE: 4.0000 | ORAL_CAPSULE | Freq: Two times a day (BID) | ORAL | 0 refills | Status: AC

## 2023-02-24 MED ORDER — DOXYCYCLINE HYCLATE 100 MG PO TABS: 100.0000 mg | ORAL_TABLET | Freq: Two times a day (BID) | ORAL | 0 refills | Status: AC

## 2023-02-24 NOTE — Assessment & Plan Note (Signed)
Nonfluctuant in office.  Will start patient on doxycycline 100 mg twice daily for 7 days.  Follow-up if no improvement patient can use warm compresses and over-the-counter Tylenol as needed

## 2023-02-24 NOTE — Progress Notes (Signed)
 Acute Office Visit  Subjective:     Patient ID: Adam [name], male    DOB: [birth year], 72 y.o.   MRN: [id number]  Chief Complaint  Patient presents with   Mass    Pt complains of lump on neck that started 2-3 years ago. States it started off itchy. The pain from lump started a week ago. Pt states the lump is hard and tender at the base. Pain level is high.     HPI Patient is in today for lump on neck with a history of  CHF, DVT, HTN, DM2, hyperparathyroidism, CKD, RCC  Lump in neck: states that he has had it for 2-3 years and has been evaluated for it in the past. States that recently (aprrox 1 week ago) it got more tender and his wife states that there is another knot. States that it did have a deep itch that he could not get to it. States that he has pain and now tender to touch. States that he has been taking tylenonl for the pain that has helped some. States that he has been using icy hot that will help some    Runny nose: sympotms started yesterday. State that he has not been around anyone sick. States that his wife has been out and about and maybe had a cold  Covid vaccine: original series Flu vaccine: up to date Has been doing cold and flu   Review of Systems  Constitutional:  Positive for malaise/fatigue. Negative for chills and fever.  HENT:  Positive for congestion. Negative for ear pain (full feeling) and sore throat.        Sneezing   Respiratory:  Positive for cough. Negative for sputum production and shortness of breath.   Gastrointestinal:  Negative for abdominal pain, nausea and vomiting.  Neurological:  Negative for headaches.        Objective:    BP 138/82   Pulse 75   Temp 97.8 F (36.6 C) (Oral)   Ht 5' 11.5 (1.816 m)   Wt 254 lb (115.2 kg)   SpO2 98%   BMI 34.93 kg/m    Physical Exam Vitals and nursing note reviewed.  Constitutional:      Appearance: Normal appearance.  HENT:     Right Ear: Tympanic membrane, ear canal and external ear  normal.     Left Ear: Tympanic membrane, ear canal and external ear normal.     Mouth/Throat:     Mouth: Mucous membranes are moist.     Pharynx: Oropharynx is clear.  Cardiovascular:     Rate and Rhythm: Normal rate and regular rhythm.     Heart sounds: Normal heart sounds.  Pulmonary:     Effort: Pulmonary effort is normal.     Breath sounds: Normal breath sounds.  Lymphadenopathy:     Cervical: No cervical adenopathy.  Skin:      Neurological:     Mental Status: He is alert.     Results for orders placed or performed in visit on 02/24/23  POC COVID-19  Result Value Ref Range   SARS Coronavirus 2 Ag Positive (A) Negative        Assessment & Plan:   Problem List Items Addressed This Visit       Other   COVID-19   Tested positive for COVID-19 in office.  Patient is anticoagulated so we will choose molnupiravir  as directed.  Signs and symptoms reviewed when to be seen emergently.  Quarantine instructions reviewed with patient.  Relevant Medications   molnupiravir  EUA (LAGEVRIO ) 200 mg CAPS capsule   cetirizine  (ZYRTEC ) 10 MG tablet   Other Relevant Orders   POC COVID-19 (Completed)   Abscess   Nonfluctuant in office.  Will start patient on doxycycline  100 mg twice daily for 7 days.  Follow-up if no improvement patient can use warm compresses and over-the-counter Tylenol  as needed      Relevant Medications   doxycycline  (VIBRA -TABS) 100 MG tablet   Nasal congestion - Primary   COVID test in office.  Patient continue using over-the-counter nasal spray and antihistamine.      Relevant Medications   cetirizine  (ZYRTEC ) 10 MG tablet    Meds ordered this encounter  Medications   molnupiravir  EUA (LAGEVRIO ) 200 mg CAPS capsule    Sig: Take 4 capsules (800 mg total) by mouth 2 (two) times daily for 5 days.    Dispense:  40 capsule    Refill:  0    Supervising Provider:   RANDEEN HARDY A [[id number]]   doxycycline  (VIBRA -TABS) 100 MG tablet    Sig: Take 1 tablet  (100 mg total) by mouth 2 (two) times daily for 7 days.    Dispense:  14 tablet    Refill:  0    Supervising Provider:   RANDEEN HARDY A [[id number]]   cetirizine  (ZYRTEC ) 10 MG tablet    Sig: Take 1 tablet (10 mg total) by mouth daily.    Dispense:  30 tablet    Refill:  0    Supervising Provider:   RANDEEN HARDY A [[id number]]    Return if symptoms worsen or fail to improve.  Adina Crandall, NP

## 2023-02-24 NOTE — Assessment & Plan Note (Signed)
COVID test in office.  Patient continue using over-the-counter nasal spray and antihistamine.

## 2023-02-24 NOTE — Patient Instructions (Signed)
Nice to see you today I have sent in antibiotic for the abscess on your neck You can use warm compresses and tylenol as directed  You tested positive for covid. I have sent in an antiviral medication. I would quarantine for the next 3-5 days

## 2023-02-24 NOTE — Assessment & Plan Note (Signed)
Tested positive for COVID-19 in office.  Patient is anticoagulated so we will choose molnupiravir as directed.  Signs and symptoms reviewed when to be seen emergently.  Quarantine instructions reviewed with patient.

## 2023-03-12 ENCOUNTER — Telehealth: Payer: Self-pay

## 2023-03-12 NOTE — Progress Notes (Signed)
Care Guide Pharmacy Note  03/12/2023 Name: Adam [name] MRN: [id number] DOB: 17-Oct-1950  Referred By: [name], MD Reason for referral: Care Coordination ([facility] Diabetes. )   Adam [name] is a 73 y.o. year old male who is a primary care patient of [name], MD.  Adam [name] was referred to the pharmacist for assistance related to: DMII  Successful contact was made with the patient to discuss pharmacy services including being ready for the pharmacist to call at least 5 minutes before the scheduled appointment time and to have medication bottles and any blood pressure readings ready for review. The patient agreed to meet with the pharmacist via telephone visit on (date/time). 03/18/23 at 9:00 a.m.  [facility]  [facility], [facility] Care Management Assistant Direct Dial: [phone]  Fax: [phone]

## 2023-03-18 ENCOUNTER — Other Ambulatory Visit: Payer: Medicare PPO | Admitting: Pharmacist

## 2023-03-18 DIAGNOSIS — I700 Atherosclerosis of aorta: Secondary | ICD-10-CM

## 2023-03-18 DIAGNOSIS — Z7985 Long-term (current) use of injectable non-insulin antidiabetic drugs: Secondary | ICD-10-CM

## 2023-03-18 DIAGNOSIS — E119 Type 2 diabetes mellitus without complications: Secondary | ICD-10-CM

## 2023-03-18 MED ORDER — LANTUS SOLOSTAR 100 UNIT/ML ~~LOC~~ SOPN: 45.0000 [IU] | PEN_INJECTOR | Freq: Every day | SUBCUTANEOUS | Status: DC

## 2023-03-18 NOTE — Progress Notes (Unsigned)
03/18/2023 Name: Adam [name] MRN: [id number] DOB: Dec 30, 1950  Subjective  Chief Complaint  Patient presents with   Diabetes   Hyperlipidemia    Reason for visit: ?  Adam [name] is a 73 y.o. male with a history of diabetes (type 2), who presents today for an initial diabetes pharmacotherapy visit.? Pertinent PMH also includes HTN w h/o syncope, CKD, HFpEF, obesity, hx gout, pancreatic cyst, hyperparathyroidism (renal origin), BPH.  Known DM Complications: retinopathy w macular edema, CKD, neuropathy  Care Team: Primary Care Provider: [name], MD Cardiology - [name], MD   Nephrology  Date of Last Diabetes Related Visit: with PCP on 09/25/22   Recent Summary of Change: 09/25/22: +glipizide XL  Medication Access/Adherence: Prescription drug coverage: Payor: HUMANA MEDICARE / Plan: HUMANA MEDICARE CHOICE PPO / Product Type: *No Product type* / .  - Reports that all medications are affordable. Cannot recall copay on brand-name meds. Fills meds through [employer].  - Current Patient Assistance: None - Medication Adherence: Patient denies missing doses of their medication.     Since Last visit / History of Present Illness: ?  Endocrinology referral placed in the past 2014, 2016 for DM2 mgmt though patient has never followed w Endo for this. Cannot recall ever considering CGM.   Patient reports implementing plan from last visit. Denies adverse effects with current regimen. Denies hx adverse effects with Ozempic or with Trulicity titrations in the past. No GI concerns today.   Reports eating less after recent Covid infection in December. Feels appetite is better though still eating less than previous. Is eating regularly scheduled balanced meals w protein.   Reported DM Regimen: ?  Lantus (glargine) 50 units daily (uses 35 units if FBG is <60 mg/dL) dulaglutide (Trulicity) 3.0 mg once weekly Jardiance 25 mg daily   DM medications  tried in the past:?  Ozempic (stopped due to backorder) Januvia (stopped due to Ozempic start)   SMBG Per BG meter: ? Denies previous discussions CGM. Is open if covered.  Date FBG  5pm (before dinner) 1/6 83 1/7 80 1/8 69 1/9 68 1/10 99 1/11 65 1/12 58 1/13 91 1/16 99      80 (5pm) 1/17 93  139 1/18 70  117 1/19 91  140 1/20 51  149 1/21 82  106 1/22 103    Hypo/Hyperglycemia: ?  Symptoms of hypoglycemia since last visit:? yes (Sx: tired, eye sight blurry) If yes, it was treated by:  Hard candy or "let it come up on its own"   Symptoms of hyperglycemia since last visit:? no - none  Reported Diet: Patient typically eats 2 meals per day.  Breakfast (10 am - glipizide at 9-9:30 am): 2 eggs, sausage, small portion of grits Lunch: Skips Dinner (5-6 pm): Baked chicken or fish, pinto/black beans, cabbage, carrots, broccoli.  Snacks: Occasionally (~1-2 days per week: ice cream cone or cookie) Beverages: Water (~64oz/day), occasionally diet soda, tea (sweet tea - 1/2 gallon lasts ~1week), lemonade, cranberry juice, orange juice (1-2 glasses/week).   Exercise: Ambulates well. Has not got around to starting gym but this is a goal once weather gets warmer. In summer is more active in the yard. Park in town has a track for walking.   DM Prevention:  Statin: Taking; high intensity.?  History of chronic kidney disease? yes History of albuminuria? yes, last UACR on 03/27/22 = 456 mg/g ACE/ARB - Taking lisinopril 20 mg daily; Urine MA/CR Ratio -  elevated urinary albumin excretion.  Last eye exam: 09/18/2021; Retinopathy Present - DUE Last foot exam: 03/27/2022 Tobacco Use: Former smoker (quit >17yr ago) Immunizations:? Flu: Up to Date (last received 10/26/22) (Last: 10/26/2022); Pneumococcal: Up to Date PPSV23 (2018, 2003) PCV13 (2016); Shingrix:  x1 in 2016, unclear if 2nd dose received ; Covid (x2 2021, no Booster hx)  Cardiovascular Risk Reduction History of clinical ASCVD? no The  10-year ASCVD risk score (Arnett DK, et al., 2019) is: 37.6% History of heart failure? yes History of hyperlipidemia? yes Current BMI: 34.9 kg/m2 (Ht 5'11.5", Wt 115.2 kg) Taking statin? yes; high intensity (atorvastatin 80mg ) Taking aspirin? not indicated; Not taking   Taking SGLT-2i? yes Taking GLP- 1 RA? yes      _______________________________________________  Objective    Review of Systems:? Limited in the setting of virtual visit  Constitutional:? No fever, chills or unintentional weight loss  Cardiovascular:? No chest pain or pressure, shortness of breath, dyspnea on exertion, orthopnea or LE edema  GI:? No nausea, vomiting, constipation, diarrhea, abdominal pain, dyspepsia, change in bowel habits  Endocrine:? No polyuria, polyphagia or blurred vision    Physical Examination:  Vitals:  Wt Readings from Last 3 Encounters:  02/24/23 254 lb (115.2 kg)  12/29/22 254 lb (115.2 kg)  11/13/22 255 lb 8 oz (115.9 kg)   BP Readings from Last 3 Encounters:  02/24/23 138/82  12/29/22 128/88  11/13/22 (!) 146/60   Pulse Readings from Last 3 Encounters:  02/24/23 75  12/29/22 77  11/13/22 96     Labs:?  Lab Results  Component Value Date   HGBA1C 10.0 (A) 12/29/2022   HGBA1C 10.7 (A) 09/25/2022   HGBA1C 8.9 (A) 06/25/2022   GLUCOSE 127 (H) 10/06/2022   MICRALBCREAT 456.2 (H) 03/27/2022   MICRALBCREAT 185.1 (H) 06/26/2011   MICRALBCREAT 77.4 (H) 06/17/2010   CREATININE 1.92 (H) 10/06/2022   CREATININE 1.96 (H) 07/07/2022   CREATININE 1.72 (H) 05/16/2022   GFR 41.63 (L) 03/27/2022   GFR 36.13 (L) 09/13/2021   GFR 35.85 (L) 12/14/2020    Lab Results  Component Value Date   CHOL 187 03/27/2022   LDLCALC 99 03/27/2022   LDLCALC 102 (H) 12/14/2020   LDLCALC 84 08/02/2018   LDLDIRECT 112.0 12/13/2019   LDLDIRECT 102.0 05/25/2017   HDL 69.00 03/27/2022   TRIG 97.0 03/27/2022   TRIG 140.0 12/14/2020   TRIG 213.0 (H) 12/13/2019   ALT 25 10/06/2022   ALT 15 07/07/2022    AST 28 10/06/2022   AST 23 07/07/2022      Chemistry      Component Value Date/Time   NA 137 10/06/2022 0802   K 3.5 10/06/2022 0802   CL 108 10/06/2022 0802   CO2 21 (L) 10/06/2022 0802   BUN 29 (H) 10/06/2022 0802   CREATININE 1.92 (H) 10/06/2022 0802      Component Value Date/Time   CALCIUM 8.9 10/06/2022 0802   ALKPHOS 71 10/06/2022 0802   AST 28 10/06/2022 0802   ALT 25 10/06/2022 0802   BILITOT 0.4 10/06/2022 0802       The 10-year ASCVD risk score (Arnett DK, et al., 2019) is: 37.6%  Assessment and Plan:   1. Diabetes, type 2: uncontrolled per last A1c of 10% (114/24), decreased from previous 10.7%. Reasonable goal <7.5-8% without hypoglycemia given significant comorbidity.  Current Regimen   Jardiance 25 mg/d, glipizide XL 5 mg/d, Lantus 50 u/d, Trulicity 3 mg/wk  Glucometer data shows fasting sugars all within goal though with  frequent hypoglycemia. Diet: Discussed buying unsweet tea and sweetening w Stevia. Advised to avoid artificial sweetener per CKD.  Exercise: Minimal. Discussed goal on increasing steps (in the winter consider walking around grocery store/mall or treadmill). Consider resistance bands in the home if too cold to make to to the gym.    Patient would benefit from safety standpoint from CGM to which he is open. Basal reduction warranted w recurrent fasting lows. No c/f lows between breakfast/dinner meals. Okay to continue glipizide for now w basal reduction.  10% reduction in basal insulin 50 > 45 units daily.  If continued fasting lows, ?? to 40 units daily  Reviewed s/sx/tx hypoglycemia. Emphasize importance of treating lows. Reviewed Rule of 15 if BG <70.  Discussed VBCI Pharm referral with PCP.  Libre 3 reader: Prior auth previously approved per CMM. Test claim requested from [facility] for sensors  Consider repeat UACR at follow up Future Consideration: GLP1-RA: Increase Trulicity to max dose 4.5 mg or transition back to Ozempic for  added A1c reduction propensity (prev stopped d/t backorder).  SU: Will consider glipizide dose reduction/discontinuation with GLP1 titration at follow up visits, especially in the setting of CKD, SU kinetics are less predictable. Metformin: Avoid in setting of CKD TZD: Avoiding in the setting of HF Dx Insulin: Continue to maximize non-insulin therapies w goal of reducing insulin dose given risk hypoglycemia.    2. ASCVD (primary prevention): LDL elevated on last lipid panel: 99 mg/dL, TG 97 mg/dL (03/01/08). LDL goal <70 mg/dL (primary prevention, diabetes).  Key risk factors include: diabetes, hypertension, hyperlipidemia, former smoker, BMI >30 kg/m2, and sedentary lifestyle The 10-year ASCVD risk score (Arnett DK, et al., 2019) is: 37.6% indicated patient is at High risk.  Current Regimen: atorvastatin 80 mg daily Continue medications today without changes. Follows w Cardiology Consider repeat lipid panel at next office visit Feb.    Follow Up If CGM reasonable copay, f/u w Pharm in-office CGM training / diabetes management Will schedule phone f/u after upcoming PCP visit Patient given direct line for questions regarding medication therapy  Future Appointments  Date Time Provider Department Center  03/31/2023  8:30 AM [name], MD [facility]  04/13/2023 10:30 AM [facility] None  04/14/2023 10:30 AM [name], MD [facility] None    Loree Fee, PharmD Clinical Pharmacist [facility] [phone]

## 2023-03-19 ENCOUNTER — Telehealth: Payer: Self-pay

## 2023-03-19 ENCOUNTER — Other Ambulatory Visit: Payer: Self-pay | Admitting: Pharmacist

## 2023-03-19 ENCOUNTER — Other Ambulatory Visit (HOSPITAL_COMMUNITY): Payer: Self-pay

## 2023-03-19 ENCOUNTER — Ambulatory Visit: Payer: Medicare PPO | Admitting: Internal Medicine

## 2023-03-19 ENCOUNTER — Encounter: Payer: Self-pay | Admitting: Internal Medicine

## 2023-03-19 VITALS — BP 138/80 | HR 44 | Temp 98.9°F | Ht 71.5 in | Wt 250.0 lb

## 2023-03-19 DIAGNOSIS — E1142 Type 2 diabetes mellitus with diabetic polyneuropathy: Secondary | ICD-10-CM

## 2023-03-19 DIAGNOSIS — L0211 Cutaneous abscess of neck: Secondary | ICD-10-CM | POA: Diagnosis not present

## 2023-03-19 DIAGNOSIS — L03221 Cellulitis of neck: Secondary | ICD-10-CM | POA: Diagnosis not present

## 2023-03-19 DIAGNOSIS — E119 Type 2 diabetes mellitus without complications: Secondary | ICD-10-CM

## 2023-03-19 DIAGNOSIS — Z7984 Long term (current) use of oral hypoglycemic drugs: Secondary | ICD-10-CM

## 2023-03-19 MED ORDER — DOXYCYCLINE HYCLATE 100 MG PO TABS: 100.0000 mg | ORAL_TABLET | Freq: Two times a day (BID) | ORAL | 1 refills | Status: DC

## 2023-03-19 MED ORDER — FREESTYLE LIBRE 3 READER DEVI: 0 refills | Status: DC

## 2023-03-19 MED ORDER — FREESTYLE LIBRE 3 PLUS SENSOR MISC: 11 refills | Status: DC

## 2023-03-19 MED ORDER — FREESTYLE LIBRE 3 PLUS SENSOR MISC: 1 refills | Status: DC

## 2023-03-19 NOTE — Assessment & Plan Note (Signed)
Seems to have secondary cellulitis from likely infected cyst Will refill the doxycycline 100 bid (#21 x 1)

## 2023-03-19 NOTE — Assessment & Plan Note (Addendum)
Clearly worse Some drainage---but still large fluctuant area Discussed options--he gave verbal consent for I&D  Ethyl chloride 3cc 2% lidocaine with epi 2 cm incision made horizontally across the mid abscess Pus and blood as well as cyst contents were extruded Needed to use forceps to break septations to allow fuller drainage Packed with sterile gauze Tolerated procedure Discussed home treatment Will recheck in 4 days
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
## 2023-03-19 NOTE — Telephone Encounter (Signed)
Pharmacy Patient Advocate Encounter  Insurance verification completed.   The patient is insured through [employer] test claim for FREESTYLE LIBRE 3 PLUS . Currently a quantity of 2 is a 30 day supply and the co-pay is $0 .   This test claim was processed through [facility]- copay amounts may vary at other pharmacies due to pharmacy/plan contracts, or as the patient moves through the different stages of their insurance plan.

## 2023-03-19 NOTE — Telephone Encounter (Signed)
-----   Message from Loree Fee sent at 03/18/2023 10:37 AM EST ----- Can we please tuna  test claim for x57mo of Libre 3 plus sensors? Thank you!

## 2023-03-23 ENCOUNTER — Ambulatory Visit: Payer: Medicare PPO | Admitting: Internal Medicine

## 2023-03-23 ENCOUNTER — Encounter: Payer: Self-pay | Admitting: Internal Medicine

## 2023-03-23 VITALS — BP 156/98 | HR 99 | Temp 98.3°F | Ht 71.5 in | Wt 252.0 lb

## 2023-03-23 DIAGNOSIS — K5900 Constipation, unspecified: Secondary | ICD-10-CM | POA: Diagnosis not present

## 2023-03-23 DIAGNOSIS — L0211 Cutaneous abscess of neck: Secondary | ICD-10-CM | POA: Diagnosis not present
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
## 2023-03-23 NOTE — Assessment & Plan Note (Signed)
Markedly improved but still draining  Packing pulled out some--but kept New dressing---gauze and bandaid  Continue the doxy Recheck 2 days---hopefully can remove the packing then

## 2023-03-23 NOTE — Patient Instructions (Signed)
Please try the miralax-- 1 capful in water or other beverage--three times a day until things loosen up some

## 2023-03-24 ENCOUNTER — Encounter: Payer: Self-pay | Admitting: Pharmacist

## 2023-03-24 NOTE — Progress Notes (Signed)
Spoke w [facility]   Confirmed the following:  - Libre 3 reader - $0 - Libre 3 Plus Sensors - $0  Both shipped on 03/23/23 through standard mail [employer].  Estimated delivery 3-5 business days.

## 2023-03-25 ENCOUNTER — Ambulatory Visit: Payer: Medicare PPO

## 2023-03-25 ENCOUNTER — Encounter: Payer: Self-pay | Admitting: Internal Medicine

## 2023-03-25 ENCOUNTER — Ambulatory Visit: Payer: Self-pay | Admitting: Internal Medicine

## 2023-03-25 VITALS — BP 138/80 | HR 97 | Temp 98.4°F | Ht 71.5 in | Wt 248.0 lb

## 2023-03-25 DIAGNOSIS — L0211 Cutaneous abscess of neck: Secondary | ICD-10-CM

## 2023-03-25 NOTE — Assessment & Plan Note (Signed)
Significant improvement but still draining Packing pulled out a little and trimmed New gauze/bandaid applied Asked him to refill and take the doxy 100 bid for another 7 days Can have daughter remove the rest of the packing in 2-3 days
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
## 2023-03-31 ENCOUNTER — Ambulatory Visit (INDEPENDENT_AMBULATORY_CARE_PROVIDER_SITE_OTHER): Payer: Medicare PPO | Admitting: Internal Medicine

## 2023-03-31 ENCOUNTER — Encounter: Payer: Self-pay | Admitting: Internal Medicine

## 2023-03-31 ENCOUNTER — Ambulatory Visit: Payer: Medicare PPO | Admitting: Internal Medicine

## 2023-03-31 VITALS — BP 130/88 | HR 88 | Temp 98.6°F | Ht 71.5 in | Wt 247.0 lb

## 2023-03-31 DIAGNOSIS — N2581 Secondary hyperparathyroidism of renal origin: Secondary | ICD-10-CM

## 2023-03-31 DIAGNOSIS — Z0000 Encounter for general adult medical examination without abnormal findings: Secondary | ICD-10-CM | POA: Diagnosis not present

## 2023-03-31 DIAGNOSIS — Z7984 Long term (current) use of oral hypoglycemic drugs: Secondary | ICD-10-CM

## 2023-03-31 DIAGNOSIS — E1142 Type 2 diabetes mellitus with diabetic polyneuropathy: Secondary | ICD-10-CM

## 2023-03-31 DIAGNOSIS — Z7985 Long-term (current) use of injectable non-insulin antidiabetic drugs: Secondary | ICD-10-CM

## 2023-03-31 DIAGNOSIS — Z794 Long term (current) use of insulin: Secondary | ICD-10-CM | POA: Diagnosis not present

## 2023-03-31 DIAGNOSIS — N1832 Chronic kidney disease, stage 3b: Secondary | ICD-10-CM | POA: Diagnosis not present

## 2023-03-31 DIAGNOSIS — I82502 Chronic embolism and thrombosis of unspecified deep veins of left lower extremity: Secondary | ICD-10-CM | POA: Diagnosis not present

## 2023-03-31 DIAGNOSIS — Z1211 Encounter for screening for malignant neoplasm of colon: Secondary | ICD-10-CM

## 2023-03-31 DIAGNOSIS — E119 Type 2 diabetes mellitus without complications: Secondary | ICD-10-CM

## 2023-03-31 DIAGNOSIS — I5032 Chronic diastolic (congestive) heart failure: Secondary | ICD-10-CM | POA: Diagnosis not present

## 2023-03-31 DIAGNOSIS — Z1159 Encounter for screening for other viral diseases: Secondary | ICD-10-CM | POA: Diagnosis not present

## 2023-03-31 DIAGNOSIS — M1000 Idiopathic gout, unspecified site: Secondary | ICD-10-CM

## 2023-03-31 LAB — LIPID PANEL
Cholesterol: 157 mg/dL (ref 0–200)
HDL: 35.1 mg/dL — ABNORMAL LOW (ref >39.00)
LDL Cholesterol: 89 mg/dL (ref 0–99)
NonHDL: 121.61
Total CHOL/HDL Ratio: 4
Triglycerides: 163 mg/dL — ABNORMAL HIGH (ref 0.0–149.0)
VLDL: 32.6 mg/dL (ref 0.0–40.0)

## 2023-03-31 LAB — CBC
HCT: 34.6 % — ABNORMAL LOW (ref 39.0–52.0)
Hemoglobin: 11.1 g/dL — ABNORMAL LOW (ref 13.0–17.0)
MCHC: 32 g/dL (ref 30.0–36.0)
MCV: 89.1 fL (ref 78.0–100.0)
Platelets: 242 10*3/uL (ref 150.0–400.0)
RBC: 3.88 Mil/uL — ABNORMAL LOW (ref 4.22–5.81)
RDW: 15.4 % (ref 11.5–15.5)
WBC: 7.5 10*3/uL (ref 4.0–10.5)

## 2023-03-31 LAB — HEMOGLOBIN A1C: Hgb A1c MFr Bld: 9.8 % — ABNORMAL HIGH (ref 4.6–6.5)

## 2023-03-31 LAB — RENAL FUNCTION PANEL
Albumin: 3.5 g/dL (ref 3.5–5.2)
BUN: 27 mg/dL — ABNORMAL HIGH (ref 6–23)
CO2: 26 meq/L (ref 19–32)
Calcium: 9.1 mg/dL (ref 8.4–10.5)
Chloride: 105 meq/L (ref 96–112)
Creatinine, Ser: 2.05 mg/dL — ABNORMAL HIGH (ref 0.40–1.50)
GFR: 31.39 mL/min — ABNORMAL LOW (ref >60.00)
Glucose, Bld: 106 mg/dL — ABNORMAL HIGH (ref 70–99)
Phosphorus: 3.6 mg/dL (ref 2.3–4.6)
Potassium: 3.9 meq/L (ref 3.5–5.1)
Sodium: 139 meq/L (ref 135–145)

## 2023-03-31 LAB — MICROALBUMIN / CREATININE URINE RATIO
Creatinine,U: 102.7 mg/dL
Microalb Creat Ratio: 191.9 mg/g — ABNORMAL HIGH (ref 0.0–30.0)
Microalb, Ur: 197 mg/dL — ABNORMAL HIGH (ref 0.0–1.9)

## 2023-03-31 LAB — HM DIABETES FOOT EXAM

## 2023-03-31 LAB — HEPATIC FUNCTION PANEL
ALT: 15 U/L (ref 0–53)
AST: 17 U/L (ref 0–37)
Albumin: 3.5 g/dL (ref 3.5–5.2)
Alkaline Phosphatase: 70 U/L (ref 39–117)
Bilirubin, Direct: 0.1 mg/dL (ref 0.0–0.3)
Total Bilirubin: 0.4 mg/dL (ref 0.2–1.2)
Total Protein: 7.8 g/dL (ref 6.0–8.3)

## 2023-03-31 LAB — URIC ACID: Uric Acid, Serum: 7.6 mg/dL (ref 4.0–7.8)

## 2023-03-31 NOTE — Assessment & Plan Note (Signed)
Hopefully better control Working with Lindsay--pharmacist Trulicity 3mg  weekly, jardiance 25, glipizide 5, lantus 45 daily Neuropathy mild Retinopathy also

## 2023-03-31 NOTE — Assessment & Plan Note (Signed)
Now on eliquis 2.5 bid Follows with Dr Cathie Hoops

## 2023-03-31 NOTE — Assessment & Plan Note (Signed)
Quiet on allopurionl

## 2023-03-31 NOTE — Assessment & Plan Note (Signed)
On the calcitriol

## 2023-03-31 NOTE — Assessment & Plan Note (Addendum)
I have personally reviewed the Medicare Annual Wellness questionnaire and have noted 1. The patient's medical and social history 2. Their use of alcohol, tobacco or illicit drugs 3. Their current medications and supplements 4. The patient's functional ability including ADL's, fall risks, home safety risks and hearing or visual             impairment. 5. Diet and physical activities 6. Evidence for depression or mood disorders  The patients weight, height, BMI and visual acuity have been recorded in the chart I have made referrals, counseling and provided education to the patient based review of the above and I have provided the pt with a written personalized care plan for preventive services.  I have provided you with a copy of your personalized plan for preventive services. Please take the time to review along with your updated medication list.  Will do FIT Discussed exercise Had flu vaccine--prefers no COVID Due for Td at pharmacy---also recommended RSV Consider shingrix PSA normal last year---no more checking
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
## 2023-03-31 NOTE — Assessment & Plan Note (Signed)
Fairly stable On lisinopril 20 daily and jardiance Follows with Dr Suezanne Jacquet

## 2023-03-31 NOTE — Assessment & Plan Note (Signed)
Compensated with furosemide 20 bid and lisinopril

## 2023-03-31 NOTE — Progress Notes (Signed)
Hearing Screening - Comments:: Passed whisper test Vision Screening - Comments:: Has appt 04-03-23

## 2023-04-01 ENCOUNTER — Ambulatory Visit: Payer: Medicare PPO | Admitting: Pharmacist

## 2023-04-01 ENCOUNTER — Encounter: Payer: Self-pay | Admitting: Internal Medicine

## 2023-04-01 ENCOUNTER — Encounter: Payer: Self-pay | Admitting: Pharmacist

## 2023-04-01 ENCOUNTER — Telehealth: Payer: Self-pay | Admitting: Internal Medicine

## 2023-04-01 DIAGNOSIS — Z7985 Long-term (current) use of injectable non-insulin antidiabetic drugs: Secondary | ICD-10-CM | POA: Diagnosis not present

## 2023-04-01 DIAGNOSIS — Z7984 Long term (current) use of oral hypoglycemic drugs: Secondary | ICD-10-CM | POA: Diagnosis not present

## 2023-04-01 DIAGNOSIS — E1142 Type 2 diabetes mellitus with diabetic polyneuropathy: Secondary | ICD-10-CM | POA: Diagnosis not present

## 2023-04-01 LAB — HEPATITIS C ANTIBODY: Hepatitis C Ab: NONREACTIVE

## 2023-04-01 NOTE — Telephone Encounter (Signed)
Spoke to pt's wife. Advised her they should be able to see his labs on careeverywhere. If they cannot, we can send them.

## 2023-04-01 NOTE — Patient Instructions (Signed)
 Sensor Application If using the App, you can tap Help in the Main Menu to access an in-app tutorial on applying a Sensor. See below for instructions on how to download the app. Apply Sensors only on the back of your upper arm. If placed in other areas, the Sensor may not function properly and could give you inaccurate readings. Avoid areas with scars, moles, stretch marks, or lumps.   Select an area of skin that generally stays flat during your normal daily activities (no bending or folding). Choose a site that is at least 1 inch (2.5 cm) away from any injection sites. To prevent discomfort or skin irritation, you should select a different site other than the one most recently used. Wash application site using a plain soap, dry, and then clean with an alcohol wipe. This will help remove any oily residue that may prevent the sensor from sticking properly. Allow site to air dry before proceeding. Note: The area MUST be clean and dry, or the Sensor may not stay on for the full wear duration specified by your Sensor insert. 4. Unscrew the cap from the Sensor Applicator and set the cap aside.  5. Place the Sensor Applicator over the prepared site and push down firmly to apply the Sensor to your body. 6. Gently pull the Sensor Applicator away from your body. The Sensor should now be attached to your skin. 7. Make sure the Sensor is secure after application. Put the cap back on the Sensor Applicator. Discard the used Engineer, Agricultural according to local regulations.  What If My Sensor Falls Off or What If My Sensor Isn't Working? Call [employer] Customer Care Team at [phone] Available 7 days a week from 8AM-8PM EST, excluding holidays   The App Download the FreeStyle [place] 3 App in your phone's app store   Load the app and select get started now Create an account  Tap scan new sensor Follow the prompts on the screen. If your sensor does not sync, try moving your phone slowly around the sensor.  Phone cases may affect scanning. This will be the only time you have to scan the sensor until you apply a new sensor in 14 days.  There will be a 60 minute start up period until the app will display your glucose reading

## 2023-04-01 NOTE — Progress Notes (Signed)
 04/01/2023 Name: Adam [name] MRN: [id number] DOB: 06-Dec-1950  Subjective  Chief Complaint  Patient presents with   Diabetes    Reason for visit: ?  Adam [name] is a 73 y.o. male with a history of diabetes (type 2), who presents today for a CGM teaching/setup visit.  Care Team: Primary Care Provider: [name], MD  Medication Access/Adherence: Prescription drug coverage: Payor: HUMANA MEDICARE / Plan: HUMANA MEDICARE CHOICE PPO / Product Type: *No Product type* / .  - Reports that all medications are affordable.  - [facility]: Confirmed Libre 3+ reader and sensors are $0 per month  Since Last visit / History of Present Illness: ?  Today, patient has brought their Libre 3+ sensor + reader device with them to the visit.   Patient does not have a compatible smart phone though his wife does.   Reported DM Regimen: ?  Lantus  45 units at night  Trulicity  3 mg weekly  Glipizide  5 gm qam Jardiance  25 mg daily   SMBG Per BG meter: ?  Since reducing insulin  dose from 50 to 45 units as previously instructed, has had no further overnight/fasting lows.  FBG remain typically in the 80s-100s. x1 instance of FBG 200.  Prior to dinner (PP lunch): Majority of sugars <180.     Hypo/Hyperglycemia: ?  Symptoms of hypoglycemia since last visit:? no  If yes, it was treated by: n/a  Symptoms of hyperglycemia since last visit:? no      _______________________________________________  Objective    Review of Systems:? Constitutional:? No fever, chills or unintentional weight loss  GI:? No nausea, vomiting, constipation, diarrhea, abdominal pain, dyspepsia, change in bowel habits  Endocrine:? No polyuria, polyphagia or blurred vision    Physical Examination:  Vitals:  Wt Readings from Last 3 Encounters:  03/31/23 247 lb (112 kg)  03/25/23 248 lb (112.5 kg)  03/23/23 252 lb (114.3 kg)   BP Readings from Last 3 Encounters:  03/31/23 130/88  03/25/23  138/80  03/23/23 (!) 156/98   Pulse Readings from Last 3 Encounters:  03/31/23 88  03/25/23 97  03/23/23 99     Labs:?  Lab Results  Component Value Date   HGBA1C 9.8 (H) 03/31/2023   HGBA1C 10.0 (A) 12/29/2022   HGBA1C 10.7 (A) 09/25/2022   GLUCOSE 106 (H) 03/31/2023   MICRALBCREAT 191.9 (H) 03/31/2023   MICRALBCREAT 456.2 (H) 03/27/2022   MICRALBCREAT 185.1 (H) 06/26/2011   CREATININE 2.05 (H) 03/31/2023   CREATININE 1.92 (H) 10/06/2022   CREATININE 1.96 (H) 07/07/2022   GFR 31.39 (L) 03/31/2023   GFR 41.63 (L) 03/27/2022   GFR 36.13 (L) 09/13/2021    Lab Results  Component Value Date   CHOL 157 03/31/2023   LDLCALC 89 03/31/2023   LDLCALC 99 03/27/2022   LDLCALC 102 (H) 12/14/2020   LDLDIRECT 112.0 12/13/2019   LDLDIRECT 102.0 05/25/2017   HDL 35.10 (L) 03/31/2023   TRIG 163.0 (H) 03/31/2023   TRIG 97.0 03/27/2022   TRIG 140.0 12/14/2020   ALT 15 03/31/2023   ALT 25 10/06/2022   AST 17 03/31/2023   AST 28 10/06/2022      Chemistry      Component Value Date/Time   NA 139 03/31/2023 0914   K 3.9 03/31/2023 0914   CL 105 03/31/2023 0914   CO2 26 03/31/2023 0914   BUN 27 (H) 03/31/2023 0914   CREATININE 2.05 (H) 03/31/2023 0914   CREATININE 1.92 (H) 10/06/2022 0802  Component Value Date/Time   CALCIUM  9.1 03/31/2023 0914   ALKPHOS 70 03/31/2023 0914   AST 17 03/31/2023 0914   AST 28 10/06/2022 0802   ALT 15 03/31/2023 0914   ALT 25 10/06/2022 0802   BILITOT 0.4 03/31/2023 0914   BILITOT 0.4 10/06/2022 0802       The 10-year ASCVD risk score (Arnett DK, et al., 2019) is: 38.8%  Assessment and Plan:   1. Diabetes, type 2: uncontrolled per last A1c of 9.8% (03/31/23), relatively unchanged from previous 10%. Goal <7% without hypoglycemia though less stringent goal <7.5% reasonable if c/f hypoglycemia. No c/f hypoglycemia on current regimen. Fasting sugars appear very well controlled <130 mg/dL most days.   Freestyle Libre CGM training  provided: System overview of Libre 3 including 15 day sensor wear and appropriate site placements Reviewed trend arrows and treament decisions, along with noting a 5-10 minute delay in readings and to always confirm low glucose alarms with glucometer if no symptoms.  Reviewed possibility of false compression lows Reviewed troubleshoot guides and to call [employer] Customer Support for technical issues or difficulties. Libre 3 App not installed (though discussed possibility of wife downloading in the future for use in conjunction with his reader - eg more so to download BG data remotely).  Created [employer] account with patient. Login: wife's email - [email or web address] Password written on AVS: W - - - - - 68$   Patient successfully applied sensor under supervision of PharmD.  Follow Up Patient given direct line for questions/concerns.  PharmD to follow up via phone call ~2 weeks to review first sensor data/address any questions   Future Appointments  Date Time Provider Department Center  04/13/2023 10:30 AM [facility] None  04/14/2023 10:30 AM [name], MD [facility] None  04/15/2023  9:00 AM [facility] PHARMACIST [facility]  09/28/2023  9:00 AM [name], MD [facility]    Manuelita FABIENE Kobs, PharmD Clinical Pharmacist [facility] [phone]

## 2023-04-01 NOTE — Addendum Note (Signed)
 Addended by: Daron Ellen on: 04/01/2023 02:21 PM   Modules accepted: Level of Service

## 2023-04-01 NOTE — Telephone Encounter (Signed)
Patient has an appointment with Kidney doctor and wants to release those labs to them so they don't have to complete blood work 2xs please advise if that can be done or if the separate blood work needs to be completed

## 2023-04-03 ENCOUNTER — Other Ambulatory Visit: Payer: Self-pay

## 2023-04-03 DIAGNOSIS — H35351 Cystoid macular degeneration, right eye: Secondary | ICD-10-CM | POA: Diagnosis not present

## 2023-04-03 DIAGNOSIS — E113513 Type 2 diabetes mellitus with proliferative diabetic retinopathy with macular edema, bilateral: Secondary | ICD-10-CM | POA: Diagnosis not present

## 2023-04-03 DIAGNOSIS — Z0100 Encounter for examination of eyes and vision without abnormal findings: Secondary | ICD-10-CM | POA: Diagnosis not present

## 2023-04-03 DIAGNOSIS — Z961 Presence of intraocular lens: Secondary | ICD-10-CM | POA: Diagnosis not present

## 2023-04-03 DIAGNOSIS — H2000 Unspecified acute and subacute iridocyclitis: Secondary | ICD-10-CM | POA: Diagnosis not present

## 2023-04-03 LAB — HM DIABETES EYE EXAM

## 2023-04-03 MED ORDER — GLIPIZIDE ER 5 MG PO TB24: 5.0000 mg | ORAL_TABLET | Freq: Two times a day (BID) | ORAL | 3 refills | Status: DC

## 2023-04-08 DIAGNOSIS — D631 Anemia in chronic kidney disease: Secondary | ICD-10-CM | POA: Diagnosis not present

## 2023-04-08 DIAGNOSIS — N281 Cyst of kidney, acquired: Secondary | ICD-10-CM | POA: Diagnosis not present

## 2023-04-08 DIAGNOSIS — I10 Essential (primary) hypertension: Secondary | ICD-10-CM | POA: Diagnosis not present

## 2023-04-08 DIAGNOSIS — E1122 Type 2 diabetes mellitus with diabetic chronic kidney disease: Secondary | ICD-10-CM | POA: Diagnosis not present

## 2023-04-08 DIAGNOSIS — N1832 Chronic kidney disease, stage 3b: Secondary | ICD-10-CM | POA: Diagnosis not present

## 2023-04-08 DIAGNOSIS — R809 Proteinuria, unspecified: Secondary | ICD-10-CM | POA: Diagnosis not present

## 2023-04-08 DIAGNOSIS — I82402 Acute embolism and thrombosis of unspecified deep veins of left lower extremity: Secondary | ICD-10-CM | POA: Diagnosis not present

## 2023-04-08 DIAGNOSIS — I509 Heart failure, unspecified: Secondary | ICD-10-CM | POA: Diagnosis not present

## 2023-04-08 DIAGNOSIS — N2581 Secondary hyperparathyroidism of renal origin: Secondary | ICD-10-CM | POA: Diagnosis not present

## 2023-04-13 ENCOUNTER — Inpatient Hospital Stay: Payer: Medicare PPO | Attending: Oncology

## 2023-04-13 DIAGNOSIS — M109 Gout, unspecified: Secondary | ICD-10-CM | POA: Diagnosis not present

## 2023-04-13 DIAGNOSIS — Z881 Allergy status to other antibiotic agents status: Secondary | ICD-10-CM | POA: Insufficient documentation

## 2023-04-13 DIAGNOSIS — Z860100 Personal history of colon polyps, unspecified: Secondary | ICD-10-CM | POA: Diagnosis not present

## 2023-04-13 DIAGNOSIS — Z7901 Long term (current) use of anticoagulants: Secondary | ICD-10-CM | POA: Diagnosis not present

## 2023-04-13 DIAGNOSIS — Z833 Family history of diabetes mellitus: Secondary | ICD-10-CM | POA: Insufficient documentation

## 2023-04-13 DIAGNOSIS — I129 Hypertensive chronic kidney disease with stage 1 through stage 4 chronic kidney disease, or unspecified chronic kidney disease: Secondary | ICD-10-CM | POA: Insufficient documentation

## 2023-04-13 DIAGNOSIS — E1122 Type 2 diabetes mellitus with diabetic chronic kidney disease: Secondary | ICD-10-CM | POA: Insufficient documentation

## 2023-04-13 DIAGNOSIS — K2289 Other specified disease of esophagus: Secondary | ICD-10-CM | POA: Diagnosis not present

## 2023-04-13 DIAGNOSIS — D631 Anemia in chronic kidney disease: Secondary | ICD-10-CM | POA: Insufficient documentation

## 2023-04-13 DIAGNOSIS — N183 Chronic kidney disease, stage 3 unspecified: Secondary | ICD-10-CM | POA: Diagnosis not present

## 2023-04-13 DIAGNOSIS — Z5986 Financial insecurity: Secondary | ICD-10-CM | POA: Diagnosis not present

## 2023-04-13 DIAGNOSIS — Z8249 Family history of ischemic heart disease and other diseases of the circulatory system: Secondary | ICD-10-CM | POA: Diagnosis not present

## 2023-04-13 DIAGNOSIS — Z886 Allergy status to analgesic agent status: Secondary | ICD-10-CM | POA: Insufficient documentation

## 2023-04-13 DIAGNOSIS — C641 Malignant neoplasm of right kidney, except renal pelvis: Secondary | ICD-10-CM | POA: Diagnosis not present

## 2023-04-13 DIAGNOSIS — Z8379 Family history of other diseases of the digestive system: Secondary | ICD-10-CM | POA: Diagnosis not present

## 2023-04-13 DIAGNOSIS — Z87891 Personal history of nicotine dependence: Secondary | ICD-10-CM | POA: Insufficient documentation

## 2023-04-13 DIAGNOSIS — Z818 Family history of other mental and behavioral disorders: Secondary | ICD-10-CM | POA: Insufficient documentation

## 2023-04-13 DIAGNOSIS — Z79899 Other long term (current) drug therapy: Secondary | ICD-10-CM | POA: Diagnosis not present

## 2023-04-13 LAB — CBC WITH DIFFERENTIAL (CANCER CENTER ONLY)
Abs Immature Granulocytes: 0.04 10*3/uL (ref 0.00–0.07)
Basophils Absolute: 0 10*3/uL (ref 0.0–0.1)
Basophils Relative: 1 %
Eosinophils Absolute: 0.3 10*3/uL (ref 0.0–0.5)
Eosinophils Relative: 5 %
HCT: 32.9 % — ABNORMAL LOW (ref 39.0–52.0)
Hemoglobin: 10.3 g/dL — ABNORMAL LOW (ref 13.0–17.0)
Immature Granulocytes: 1 %
Lymphocytes Relative: 48 %
Lymphs Abs: 3.3 10*3/uL (ref 0.7–4.0)
MCH: 28.1 pg (ref 26.0–34.0)
MCHC: 31.3 g/dL (ref 30.0–36.0)
MCV: 89.9 fL (ref 80.0–100.0)
Monocytes Absolute: 0.6 10*3/uL (ref 0.1–1.0)
Monocytes Relative: 9 %
Neutro Abs: 2.4 10*3/uL (ref 1.7–7.7)
Neutrophils Relative %: 36 %
Platelet Count: 224 10*3/uL (ref 150–400)
RBC: 3.66 MIL/uL — ABNORMAL LOW (ref 4.22–5.81)
RDW: 15.8 % — ABNORMAL HIGH (ref 11.5–15.5)
WBC Count: 6.7 10*3/uL (ref 4.0–10.5)
nRBC: 0 % (ref 0.0–0.2)

## 2023-04-13 LAB — CMP (CANCER CENTER ONLY)
ALT: 14 U/L (ref 0–44)
AST: 17 U/L (ref 15–41)
Albumin: 3.1 g/dL — ABNORMAL LOW (ref 3.5–5.0)
Alkaline Phosphatase: 58 U/L (ref 38–126)
Anion gap: 8 (ref 5–15)
BUN: 26 mg/dL — ABNORMAL HIGH (ref 8–23)
CO2: 25 mmol/L (ref 22–32)
Calcium: 8.6 mg/dL — ABNORMAL LOW (ref 8.9–10.3)
Chloride: 103 mmol/L (ref 98–111)
Creatinine: 1.92 mg/dL — ABNORMAL HIGH (ref 0.61–1.24)
GFR, Estimated: 36 mL/min — ABNORMAL LOW (ref >60)
Glucose, Bld: 134 mg/dL — ABNORMAL HIGH (ref 70–99)
Potassium: 3.3 mmol/L — ABNORMAL LOW (ref 3.5–5.1)
Sodium: 136 mmol/L (ref 135–145)
Total Bilirubin: 0.5 mg/dL (ref 0.0–1.2)
Total Protein: 7.9 g/dL (ref 6.5–8.1)

## 2023-04-13 LAB — RETIC PANEL
Immature Retic Fract: 11.2 % (ref 2.3–15.9)
RBC.: 3.67 MIL/uL — ABNORMAL LOW (ref 4.22–5.81)
Retic Count, Absolute: 61.7 10*3/uL (ref 19.0–186.0)
Retic Ct Pct: 1.7 % (ref 0.4–3.1)
Reticulocyte Hemoglobin: 30.2 pg (ref >27.9)

## 2023-04-13 LAB — FERRITIN: Ferritin: 71 ng/mL (ref 24–336)

## 2023-04-13 LAB — IRON AND TIBC
Iron: 41 ug/dL — ABNORMAL LOW (ref 45–182)
Saturation Ratios: 24 % (ref 17.9–39.5)
TIBC: 172 ug/dL — ABNORMAL LOW (ref 250–450)
UIBC: 131 ug/dL

## 2023-04-14 ENCOUNTER — Encounter: Payer: Self-pay | Admitting: Oncology

## 2023-04-14 ENCOUNTER — Inpatient Hospital Stay: Payer: Medicare PPO | Admitting: Oncology

## 2023-04-14 VITALS — BP 136/84 | HR 78 | Temp 97.1°F | Resp 18 | Wt 248.5 lb

## 2023-04-14 DIAGNOSIS — K2289 Other specified disease of esophagus: Secondary | ICD-10-CM | POA: Diagnosis not present

## 2023-04-14 DIAGNOSIS — K862 Cyst of pancreas: Secondary | ICD-10-CM

## 2023-04-14 DIAGNOSIS — E1122 Type 2 diabetes mellitus with diabetic chronic kidney disease: Secondary | ICD-10-CM | POA: Diagnosis not present

## 2023-04-14 DIAGNOSIS — C641 Malignant neoplasm of right kidney, except renal pelvis: Secondary | ICD-10-CM | POA: Diagnosis not present

## 2023-04-14 DIAGNOSIS — I129 Hypertensive chronic kidney disease with stage 1 through stage 4 chronic kidney disease, or unspecified chronic kidney disease: Secondary | ICD-10-CM | POA: Diagnosis not present

## 2023-04-14 DIAGNOSIS — M109 Gout, unspecified: Secondary | ICD-10-CM | POA: Diagnosis not present

## 2023-04-14 DIAGNOSIS — N1832 Chronic kidney disease, stage 3b: Secondary | ICD-10-CM | POA: Diagnosis not present

## 2023-04-14 DIAGNOSIS — I82401 Acute embolism and thrombosis of unspecified deep veins of right lower extremity: Secondary | ICD-10-CM

## 2023-04-14 DIAGNOSIS — Z5986 Financial insecurity: Secondary | ICD-10-CM | POA: Diagnosis not present

## 2023-04-14 DIAGNOSIS — Z7901 Long term (current) use of anticoagulants: Secondary | ICD-10-CM | POA: Diagnosis not present

## 2023-04-14 DIAGNOSIS — D631 Anemia in chronic kidney disease: Secondary | ICD-10-CM

## 2023-04-14 DIAGNOSIS — N183 Chronic kidney disease, stage 3 unspecified: Secondary | ICD-10-CM | POA: Diagnosis not present

## 2023-04-14 NOTE — Assessment & Plan Note (Signed)
CA 19.9 normal. 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  S/p  EUS evaluation.  Recommend to reviewed MRI/ MRCP

## 2023-04-14 NOTE — Assessment & Plan Note (Signed)
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  Continue annual surveillance.

## 2023-04-14 NOTE — Assessment & Plan Note (Signed)
Stable Cr.  Avoid nephrotoxins and encourage oral hydration.  

## 2023-04-14 NOTE — Assessment & Plan Note (Addendum)
Hemoglobin has decreased. I did iron panel showed Lab Results  Component Value Date   HGB 10.3 (L) 04/13/2023   TIBC 172 (L) 04/13/2023   IRONPCTSAT 24 04/13/2023   FERRITIN 71 04/13/2023    Hemoglobin has decreased I recommend ferrous sulfate, increase to  325 mg BID
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
## 2023-04-14 NOTE — Assessment & Plan Note (Signed)
#  unprovoked acute lower extremity DVT.  Negative prothrombin gene and factor V Leiden mutation, negative APS antibodies..Repeat left lower extremity DVT.  Continue Eliquis 2.5mg  BID for long term prophylaxis.   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

## 2023-04-15 ENCOUNTER — Other Ambulatory Visit (INDEPENDENT_AMBULATORY_CARE_PROVIDER_SITE_OTHER): Payer: Medicare PPO | Admitting: Pharmacist

## 2023-04-15 ENCOUNTER — Telehealth: Payer: Self-pay

## 2023-04-15 ENCOUNTER — Encounter: Payer: Self-pay | Admitting: Pharmacist

## 2023-04-15 DIAGNOSIS — E162 Hypoglycemia, unspecified: Secondary | ICD-10-CM

## 2023-04-15 DIAGNOSIS — E1142 Type 2 diabetes mellitus with diabetic polyneuropathy: Secondary | ICD-10-CM

## 2023-04-15 DIAGNOSIS — N1832 Chronic kidney disease, stage 3b: Secondary | ICD-10-CM

## 2023-04-15 DIAGNOSIS — C641 Malignant neoplasm of right kidney, except renal pelvis: Secondary | ICD-10-CM

## 2023-04-15 DIAGNOSIS — I700 Atherosclerosis of aorta: Secondary | ICD-10-CM

## 2023-04-15 NOTE — Telephone Encounter (Signed)
-----   Message from [name] sent at 04/14/2023  7:46 PM EST ----- Please change the order of MRI abdomen with and without contrast to MRI/MRCP with and without contrast.  Sorry for the inconvenience.

## 2023-04-15 NOTE — Progress Notes (Signed)
04/15/2023 Name: Adam [name] MRN: [id number] DOB: 1950-08-05  Subjective  Chief Complaint  Patient presents with   Diabetes   Hyperlipidemia   Chronic Kidney Disease    Reason for visit: ?  Adam [name] is a 73 y.o. male with a history of diabetes (type 2), who presents today for an initial diabetes pharmacotherapy visit.? Pertinent PMH also includes HTN w h/o syncope, CKD, HFpEF, obesity, hx gout, pancreatic cyst, hyperparathyroidism (renal origin), BPH.  Known DM Complications: retinopathy w macular edema, CKD, neuropathy  Care Team: Primary Care Provider: [name], MD Cardiology - [name], MD   Nephrology  Date of Last Diabetes Related Visit: with PCP on 03/31/23 ; 04/01/23 with pharmD (CGM training); 03/18/23 DM visit Recent Summary of Change: 04/02/23: ??glipizide XL 5 mg - twice daily 1/22: 10% reduction in basal insulin 50 > 45 units daily 2.2 frequent FBG <70 09/25/22: +glipizide XL  Medication Access/Adherence: Prescription drug coverage: Payor: HU[employer]NA MEDICARE / Plan: HU[employer]NA MEDICARE CHOICE PPO / Product Type: *No Product type* / .  - Reports that Trulicity and Jardiance are quite expensive. They will do what they have to for pt's health. Denies attempt at PAP for Trulicity previously. Lantus = $80/box (15 mL = ~30 days) - Current Patient Assistance: None Lives in household of 2 with his wife - income from social security. 43,000 per year.  - Medication Adherence: Patient denies missing doses of their medication.    Since Last visit / History of Present Illness: ?  Since last visit (CGM training/setup), patient reports no issues with his sensors. Denies issues with CGM sensor connectivity or adhesion.   Denies hx adverse effects with Ozempic or with Trulicity titrations in the past. Seems c/f backorder previous a barrier to Trulicity titration. No GI concerns today. Confirms increasing glipizide to twice daily dosing per PCP instruction at last visit.  No concerns.    Reported DM Regimen: ?  Lantus (glargine) 45 units daily (night) dulaglutide (Trulicity) 3.0 mg once weekly Jardiance 25 mg daily Glipizide XL 5 mg - twice daily     DM medications tried in the past:?  Ozempic (stopped due to backorder) Januvia (stopped due to Ozempic start)   SMBG  Libre 3 plus (wReader) : Since last visit, reports  Lowest 68, 83 mg/dL.  Average glucose Last 14 Days: 193 mg/dL Average glucose by time of day:     12 am - 6 am: 195        6 am - 12 pm: 130       12 pm - 6 pm: 226      6 pm - 12 pm: 227  Hypo/Hyperglycemia: ?  Symptoms of hypoglycemia since last visit:? no (Previous Sx: tired, eye sight blurry) If yes, it was treated by: n/a  Symptoms of hyperglycemia since last visit:? no - none  Reported Diet: Patient typically eats 2 meals per day.  Breakfast (10 am - glipizide at 9-9:30 am): 2 eggs, sausage, small portion of grits Lunch: Skips Dinner (5-6 pm): Baked chicken or fish, pinto/black beans, cabbage, carrots, broccoli.  Snacks: Occasionally (~1-2 days per week: ice cream cone or cookie) Beverages: Water (~64oz/day), occasionally diet soda, tea (sweet tea - 1/2 gallon lasts ~1week), lemonade, cranberry juice, orange juice (1-2 glasses/week).   Exercise: Ambulates well. Has not got around to starting gym but this is a goal once weather gets warmer. In summer is more active in the yard. Park in town has a Midwife  for walking.   DM Prevention:  Statin: Taking; high intensity.?  History of chronic kidney disease? yes History of albuminuria? yes, last UACR on 03/27/22 = 456 mg/g ACE/ARB - Taking lisinopril 20 mg daily; Urine MA/CR Ratio - elevated urinary albumin excretion.  Last eye exam: 09/18/2021; Retinopathy Present - DUE Last foot exam: 03/31/2023 Tobacco Use: Former smoker (quit >29yr ago) Immunizations:? Flu: Up to Date (last received 10/26/22) (Last: 10/26/2022); Pneumococcal: Up to Date PPSV23 (2018, 2003) PCV13 (2016);  Shingrix:  x1 in 2016, unclear if 2nd dose received ; Covid (x2 2021, no Booster hx)  Cardiovascular Risk Reduction History of clinical ASCVD? no The 10-year ASCVD risk score (Arnett DK, et al., 2019) is: 41.5% History of heart failure? yes History of hyperlipidemia? yes Current BMI: 34.9 kg/m2 (Ht 5'11.5", Wt 115.2 kg) Taking statin? yes; high intensity (atorvastatin 80mg ) Taking aspirin? not indicated; Not taking   Taking SGLT-2i? yes Taking GLP- 1 RA? yes      _______________________________________________  Objective    Review of Systems:? Limited in the setting of virtual visit  Constitutional:? No fever, chills or unintentional weight loss  Cardiovascular:? No chest pain or pressure, shortness of breath, dyspnea on exertion, orthopnea or LE edema  GI:? No nausea, vomiting, constipation, diarrhea, abdominal pain, dyspepsia, change in bowel habits  Endocrine:? No polyuria, polyphagia or blurred vision    Physical Examination:  Vitals:  Wt Readings from Last 3 Encounters:  04/14/23 248 lb 8 oz (112.7 kg)  03/31/23 247 lb (112 kg)  03/25/23 248 lb (112.5 kg)   BP Readings from Last 3 Encounters:  04/14/23 136/84  03/31/23 130/88  03/25/23 138/80   Pulse Readings from Last 3 Encounters:  04/14/23 78  03/31/23 88  03/25/23 97    Labs:?  Lab Results  Component Value Date   HGBA1C 9.8 (H) 03/31/2023   HGBA1C 10.0 (A) 12/29/2022   HGBA1C 10.7 (A) 09/25/2022   GLUCOSE 134 (H) 04/13/2023   MICRALBCREAT 191.9 (H) 03/31/2023   MICRALBCREAT 456.2 (H) 03/27/2022   MICRALBCREAT 185.1 (H) 06/26/2011   CREATININE 1.92 (H) 04/13/2023   CREATININE 2.05 (H) 03/31/2023   CREATININE 1.92 (H) 10/06/2022   GFR 31.39 (L) 03/31/2023   GFR 41.63 (L) 03/27/2022   GFR 36.13 (L) 09/13/2021    Lab Results  Component Value Date   CHOL 157 03/31/2023   LDLCALC 89 03/31/2023   LDLCALC 99 03/27/2022   LDLCALC 102 (H) 12/14/2020   LDLDIRECT 112.0 12/13/2019   LDLDIRECT 102.0  05/25/2017   HDL 35.10 (L) 03/31/2023   TRIG 163.0 (H) 03/31/2023   TRIG 97.0 03/27/2022   TRIG 140.0 12/14/2020   ALT 14 04/13/2023   ALT 15 03/31/2023   AST 17 04/13/2023   AST 17 03/31/2023      Chemistry      Component Value Date/Time   NA 136 04/13/2023 1007   K 3.3 (L) 04/13/2023 1007   CL 103 04/13/2023 1007   CO2 25 04/13/2023 1007   BUN 26 (H) 04/13/2023 1007   CREATININE 1.92 (H) 04/13/2023 1007      Component Value Date/Time   CALCIUM 8.6 (L) 04/13/2023 1007   ALKPHOS 58 04/13/2023 1007   AST 17 04/13/2023 1007   ALT 14 04/13/2023 1007   BILITOT 0.5 04/13/2023 1007       The 10-year ASCVD risk score (Arnett DK, et al., 2019) is: 41.5%  Assessment and Plan:   1. Diabetes, type 2: uncontrolled per last A1c of 9.8% (03/30/22), relatively  unchanged from previous 10%. Reasonable goal <7.5-8% without hypoglycemia given significant comorbidity.  Current Regimen   Jardiance 25 mg/d, glipizide XL 5 mg BID, Lantus 45 u/d, Trulicity 3 mg/wk  CGM data: BG remain elevated, though improving. Previous c/f frequent hypoglycemia now resolved s/p 10% basal reduction. Lowest 68 x1 otherwise no lows since increasing glipizide.  Diet: Discussed buying unsweet tea and sweetening w Stevia. Advised to avoid artificial sweetener per CKD.  Exercise: Minimal. Discussed goal of increasing steps (in the winter consider walking around grocery store/mall or treadmill). Consider resistance bands in the home if too cold to make to to the gym.  Hyperglycemia concentrated to second half of the day (12pm-12am) 2/2 elevated post-prandial sugars. Would benefit from continued GLP1 titration. Discussed Trulicity vs Ozempic. Will increase Trulicity to max dose per pt preference though is open to Ozempic as needed for further glycemic control in the future,  LillyCares application initiated Increase Trulicity to 4.5 mg weekly dose (once approved for PAP) Lantus (glargine) ? Basaglar (glargine) per  LillyCares PAP.  Continue current dose 45 units/day per consistent upper 100s-200s over night/FBG.  Reviewed s/sx/tx hypoglycemia. Emphasize importance of treating lows. Reviewed Rule of 15 if BG <70.  Future Consideration: GLP1-RA: Consider transition to Ozempic for added A1c reduction propensity (prev stopped d/t backorder).  Metformin: Avoid in setting of CKD TZD: Avoiding in the setting of HF Dx Insulin: Continue to maximize non-insulin therapies w goal of reducing insulin dose given risk hypoglycemia.   2. CKD (G3bA2): Microalbuminuria present. UACR improved though remains elevated 191.9 mg/g (03/31/23), previously 456.2 mg/g (2024). Uncontrolled despite RAAS + SGLT2i therapy, though per nephrology may have been off of RAAS for some time. If continued albuminuria despite good adherence, would be indicated for inc RAAS if tolerated, and finerenone per benefits demonstrated in FIDELIO-DKD and FIGARO-DKD trials (slowed CKD progression, MACE reduction).  Continue medications today without changes. Follows w nephrology Future Consideration: RAAS: Lisinopril - Room to increase to max dose, 40 mg daily. Potassium remains on lower end. Blood pressure may be a limiting factor w furosemide in the setting of CHF though at this time BP remains slightly elevated consistently 130s/80 mmHg. Finerenone: If unable to tolerate Lisinopril titration, would be indicated for Finerenone for CKD in the setting of elevated UACR despite RAAS + SGLT2i. Potassium on low end, good candidate for Finerenone.  10 mg once daily per GFR <60. Titration to full dose 20 mg daily if potassium <4.8 on repeat labs 4 wk after initiation.    3. ASCVD (primary prevention): LDL continues to improve per recent lipid panel 03/31/23 at 89 mg/dL (previously 99, 161) though TG slightly elevated at 191. LDL goal <70 mg/dL (primary prevention, diabetes).  Key risk factors include: diabetes, hypertension, hyperlipidemia, former smoker, BMI >30  kg/m2, and sedentary lifestyle The 10-year ASCVD risk score (Arnett DK, et al., 2019) is: 41.5% indicated patient is at High risk.  Current Regimen: atorvastatin 80 mg daily Continue medications today without changes. Follows w Cardiology   Follow Up F/u once PAP approved, s/p Trulicity increase. ~3-4 weeks.  Patient given direct line for questions regarding medication therapy  Future Appointments  Date Time Provider Department Center  04/22/2023 11:00 AM [facility]  05/13/2023  9:00 AM [facility] [facility] PHARMACIST [facility]  07/01/2023  8:00 AM [name], MD [facility]  08/17/2023 10:30 AM [facility] None  08/18/2023 10:30 AM [name], MD [facility] None  08/18/2023 10:45 AM [facility] INFUSION  CHAIR 1 [facility] None  09/28/2023  9:00 AM [name], MD [facility]    Loree Fee, PharmD Clinical Pharmacist [facility] [phone]

## 2023-04-15 NOTE — Telephone Encounter (Signed)
Ash, can you check if MRI MRCP can be done instead of MRI abd, per MD request.

## 2023-04-15 NOTE — Progress Notes (Addendum)
 [employer] CAR[employer] Patient Assistance Program (PAP) Application   Manufacturer: [employer] (BI Cares)    (Re-enrollment) Medication(s): Jardiance  Patient Portion of Application:  04/15/23: Filled out and uploaded to clinic eFax folder for patient signature in front office.   Income Documentation: Patient preference to drop off copy of income documentation at clinic front desk. Social Risk analyst. Insurance Card: Pt will bring in with income documents for scan into chart.  Provider Portion of Application:  2/19:  Provider portion completed by PharmD (attached to pt pages) - once pt signs, to be placed in PCP folder Prescription(s): Included in MAP application.   Application Status: APPROVED for 2025  Next Steps (BI Cares): [x]    Application filled out and uploaded to [facility] eFax folder for front office team [x]    Once patient signs, front office to place application/forms in PCP folder for signature [x]    Upon PCP signature Application to be faxed to [employer]: [phone] with copy of patient's insurance card AND scanned into patient chart [x]    Program approval/denial received via fax and documented in chart  ____________________________________________________  2.  [name] Patient Assistance Program (PAP) Application   Manufacturer: Secretary/administrator    (New enrollment) Medication(s): Trulicity, insulin glargine (Basaglar)  Patient Portion of Application:  04/15/23: Completed with patient via online enrollment tool. Submitted. [id number]  Income Documentation: N/A - Electronic verification elected.  Provider Portion of Application:  04/15/23: Completed via online enrollment. Secure link sent to PCP for e-signature.  Prescription(s): Electronic Rx sent to [facility]) Basaglar (glargine) U-100 - 45 units daily Trulicity 4.5 mg once weekly   Application Status: -- APPROVED Psychologist, sport and exercise)  Trulicity approval requires Medical Exception Form    Next Steps: [x]    Patient portion of application submitted (online 04/15/23) [x]    PCP sent secure link for review/e-Sign of pt application.  [x]    Upon PCP e-signature, no further action needed []    eRx to be sent to Electronic Rx sent to [facility]). []    Program approval/denial received via fax and documented in chart   Forwarded to [facility] CPhT Patient Advocate Team for future correspondences/re-enrollment.  Note routed to PCP Clinic Pool to ensure PCP signature is obtained and application is faxed.  *[facility] clinic team - Please Addend/update this note as the "Next Steps" are completed in office*

## 2023-04-20 ENCOUNTER — Telehealth: Payer: Self-pay

## 2023-04-20 NOTE — Telephone Encounter (Signed)
 PAP: Patient assistance application for Trulicity has been approved by PAP Companies: [name] from 04/18/2023 to 02/24/2024. Medication should be delivered to PAP Delivery: Home. For further shipping updates, please contact [name] at [phone]. Patient ID is: NO ID

## 2023-04-20 NOTE — Telephone Encounter (Signed)
-----   Message from Loree Fee sent at 04/17/2023 11:37 AM EST ----- Regarding: PAP APPROVAL FYI -- Adam [name] pt  Approved for [employer] through 2025 ----- Message ----- From: Ardeth Sportsman Sent: 04/17/2023   9:05 AM EST To: Loree Fee, [facility]

## 2023-04-21 ENCOUNTER — Encounter: Payer: Self-pay | Admitting: Internal Medicine

## 2023-04-21 ENCOUNTER — Other Ambulatory Visit: Payer: Self-pay | Admitting: Radiology

## 2023-04-21 ENCOUNTER — Telehealth: Payer: Self-pay | Admitting: Internal Medicine

## 2023-04-21 DIAGNOSIS — Z1211 Encounter for screening for malignant neoplasm of colon: Secondary | ICD-10-CM

## 2023-04-21 LAB — FECAL OCCULT BLOOD, IMMUNOCHEMICAL: Fecal Occult Bld: NEGATIVE

## 2023-04-21 NOTE — Telephone Encounter (Signed)
 Form placed in Dr Karle Starch inbox on his desk to sign his portion.

## 2023-04-21 NOTE — Telephone Encounter (Addendum)
 Patient dropped off document Patient Assistance Application, to be filled out by provider. Patient requested to send it back via Call Patient to pick up within 5-days. Document is located in providers tray at front office.Please advise at Mobile [phone] (mobile)    Patient Assistance Application is in the providers box in front office pt signed paperwork

## 2023-04-22 ENCOUNTER — Ambulatory Visit: Payer: Medicare PPO

## 2023-04-22 ENCOUNTER — Ambulatory Visit
Admission: RE | Admit: 2023-04-22 | Discharge: 2023-04-22 | Disposition: A | Discharge status: Home / Self Care | Payer: Medicare PPO | Source: Ambulatory Visit | Attending: Oncology | Admitting: Oncology

## 2023-04-22 ENCOUNTER — Other Ambulatory Visit: Payer: Self-pay | Admitting: Oncology

## 2023-04-22 DIAGNOSIS — N280 Ischemia and infarction of kidney: Secondary | ICD-10-CM | POA: Diagnosis not present

## 2023-04-22 DIAGNOSIS — C641 Malignant neoplasm of right kidney, except renal pelvis: Secondary | ICD-10-CM

## 2023-04-22 DIAGNOSIS — Z9889 Other specified postprocedural states: Secondary | ICD-10-CM | POA: Diagnosis not present

## 2023-04-22 DIAGNOSIS — K8689 Other specified diseases of pancreas: Secondary | ICD-10-CM | POA: Diagnosis not present

## 2023-04-22 MED ORDER — GADOBUTROL 1 MMOL/ML IV SOLN
10.0000 mL | Freq: Once | INTRAVENOUS | Status: AC | PRN
Start: 2023-04-22 — End: 2023-04-22
  Administered 2023-04-22: 10 mL via INTRAVENOUS

## 2023-04-22 NOTE — Telephone Encounter (Signed)
 I have faxed the completed application to BI. I made a copy to keep at my desk for a few weeks and sent the original to the front for scanning.

## 2023-04-22 NOTE — Telephone Encounter (Signed)
 Adam [name], I am a little bit confused. The pt came and signed the forms and I had Dr Alphonsus Sias sign them. I went to document this in the Documentation Encounter and it states pt has been approved for Jardiance for 2025. Not sure what I am supposed to do with the forms, if anything.   Thanks in advance.

## 2023-05-01 ENCOUNTER — Encounter: Payer: Self-pay | Admitting: Pharmacist

## 2023-05-01 NOTE — Progress Notes (Addendum)
 Patient Assistance Program (PAP) Application   Manufacturer: [employer] (BI Cares)  Medication(s): Jardiance 25 mg tablet  Application Status:  APPROVED through 02/24/2024   Patient Assistance Program (PAP) Application   Manufacturer: [name] Medication(s): Basaglar U100, Trulicity 4.5 mg  Application Status: Training and development officer through 2025;      Trulicity pending [id number])     Medical Exception form completed 05/13/23

## 2023-05-13 ENCOUNTER — Other Ambulatory Visit: Payer: Medicare PPO | Admitting: Pharmacist

## 2023-05-13 DIAGNOSIS — E1142 Type 2 diabetes mellitus with diabetic polyneuropathy: Secondary | ICD-10-CM

## 2023-05-13 NOTE — Patient Instructions (Addendum)
 Mr. Adam [name],   It was a pleasure to speak with you today! As we discussed:?   Increase Lantus back to 50 units under the skin once daily LillyCares program will be sending you Basaglar which is the same medication (insulin glargine).  They will hopefully also be sending you Trulicity 4.5 mg injections. This prescription was sent to the program again today.   Continue all other medications as you have been taking them.   If you have not heard from the pharmacy by the time you are getting ready to place your last Libre blood sugar sensor, I would recommend giving them a call to ensure they are working on your next refill.   We also have the option this year to switch Trulicity back to Ozempic (as Ozempic can lower the A1c quite a bit better than Trulicity). There is a similar program that would provide Ozempic for free. We can chat about this application if blood sugars remain elevated on the 4.5 mg dose of Trulicity).    Please reach out prior to your next scheduled appointment should you have any questions or concerns.   Thank you!   Future Appointments  Date Time Provider Department Center  06/03/2023  9:30 AM [facility] PHARMACIST [facility]  07/01/2023  8:00 AM [name], MD [facility]  08/17/2023 10:30 AM [facility] None  08/18/2023 10:30 AM [name], MD [facility] None  08/18/2023 10:45 AM [facility] CHAIR 1 [facility] None  09/28/2023  9:00 AM [name], MD [facility]    Loree Fee, PharmD Clinical Pharmacist [facility] [phone]

## 2023-05-13 NOTE — Progress Notes (Unsigned)
 05/13/2023 Name: Adam [name] MRN: [id number] DOB: [birth year]  Subjective  Chief Complaint  Patient presents with   Diabetes    Reason for visit: ?  Adam [name] is a 73 y.o. male with a history of diabetes (type 2), who presents today for an initial diabetes pharmacotherapy visit.? Pertinent PMH also includes HTN w h/o syncope, CKD, HFpEF, obesity, hx gout, pancreatic cyst, hyperparathyroidism (renal origin), BPH.  Known DM Complications: retinopathy w macular edema, CKD, neuropathy  Care Team: Primary Care Provider: [name], MD Cardiology - [name], MD   Nephrology  Date of Last Diabetes Related Visit: with PCP on 03/31/23 ; 04/15/23 with pharmD Recent Summary of Change: 2/19: Lilly PAP application with plan for Trulicity increase to 4.5 mg weekly.  04/02/23: ??glipizide XL 5 mg - twice daily 1/22: 10% reduction in basal insulin 50 > 45 units daily 2/2 frequent FBG <70 in the setting of reduced intake 09/25/22: +glipizide XL  Medication Access/Adherence: Prescription drug coverage: Payor: [employer] MEDICARE / Plan: [employer] MEDICARE CHOICE PPO / Product Type: *No Product type* / .  - Reports that Trulicity and Jardiance are quite expensive. They will do what they have to for pt's health. Denies attempt at PAP for Trulicity previously. Lantus = $80/box (15 mL = ~30 days) - Current Patient Assistance: None Lives in household of 2 with his wife - income from social security. 43,000 per year.  - Medication Adherence: Patient denies missing doses of their medication.    Since Last visit / History of Present Illness: ?  Since last visit, patient reports no issues with his sensors. Has ~3 remaining. Has continued Trulicity 3 mg weekly. No GI concerns today.   Previously, appetite was reduced 2/2 sickness resulting in hypoglycemia episodes. Notes his appetite has returned to his usual.   Received approval letter from [facility], none from [employer].   Reported DM  Regimen: ?  Lantus (glargine) 45 units daily (night) dulaglutide (Trulicity) 3.0 mg once weekly Jardiance 25 mg daily Glipizide XL 5 mg - twice daily   DM medications tried in the past:?  Ozempic (stopped due to backorder) Januvia (stopped due to Ozempic start)   SMBG  Libre 3 plus (Reader) : Since last visit, reports   Average glucose Last 7 Days: 257 mg/dL Average glucose by time of day:     12 am - 6 am: 206        6 am - 12 pm: 203       12 pm - 6 pm: 295      6 pm - 12 pm: 334  Time in target (Last 7 Days): Above 77%; In target 23%; Below 0%  Hypo/Hyperglycemia: ?  Symptoms of hypoglycemia since last visit:? no (Previous Sx: tired, eye sight blurry) If yes, it was treated by: n/a  Symptoms of hyperglycemia since last visit:? no - none  Reported Diet: Patient typically eats 2 meals per day.  Breakfast (10 am - glipizide at 9-9:30 am): 2 eggs, sausage, small portion of grits Lunch: Skips Dinner (5-6 pm): Baked chicken or fish, pinto/black beans, cabbage, carrots, broccoli.  Snacks: Occasionally (~1-2 days per week: ice cream cone or cookie) Beverages: Water (~64oz/day), occasionally diet soda, tea (sweet tea - 1/2 gallon lasts ~1week), lemonade, cranberry juice, orange juice (1-2 glasses/week).   Exercise: Ambulates well. Has not got around to starting gym but this is a goal once weather gets warmer. In summer is more active in the yard.  Park in town has a track for walking.   DM Prevention:  Statin: Taking; high intensity.?  History of chronic kidney disease? yes History of albuminuria? yes, last UACR on 03/27/22 = 456 mg/g ACE/ARB - Taking lisinopril 20 mg daily; Urine MA/CR Ratio - elevated urinary albumin excretion.  Last eye exam: 09/18/2021; Retinopathy Present - DUE Last foot exam: 03/31/2023 Tobacco Use: Former smoker (quit >48yr ago) Immunizations:? Flu: Up to Date (last received 10/26/22) (Last: 10/26/2022); Pneumococcal: Up to Date PPSV23 (2018, 2003) PCV13  (2016); Shingrix:  x1 in 2016, unclear if 2nd dose received ; Covid (x2 2021, no Booster hx)  Cardiovascular Risk Reduction History of clinical ASCVD? no The 10-year ASCVD risk score (Arnett DK, et al., 2019) is: 41.5% History of heart failure? yes History of hyperlipidemia? yes Current BMI: 34.9 kg/m2 (Ht 5'11.5", Wt 115.2 kg) Taking statin? yes; high intensity (atorvastatin 80mg ) Taking aspirin? not indicated; Not taking   Taking SGLT-2i? yes Taking GLP- 1 RA? yes      _______________________________________________  Objective    Review of Systems:? Limited in the setting of virtual visit  Constitutional:? No fever, chills or unintentional weight loss  Cardiovascular:? No chest pain or pressure, shortness of breath, dyspnea on exertion, orthopnea or LE edema  GI:? No nausea, vomiting, constipation, diarrhea, abdominal pain, dyspepsia, change in bowel habits  Endocrine:? No polyuria, polyphagia or blurred vision    Physical Examination:  Vitals:  Wt Readings from Last 3 Encounters:  04/14/23 248 lb 8 oz (112.7 kg)  03/31/23 247 lb (112 kg)  03/25/23 248 lb (112.5 kg)   BP Readings from Last 3 Encounters:  04/14/23 136/84  03/31/23 130/88  03/25/23 138/80   Pulse Readings from Last 3 Encounters:  04/14/23 78  03/31/23 88  03/25/23 97    Labs:?  Lab Results  Component Value Date   HGBA1C 9.8 (H) 03/31/2023   HGBA1C 10.0 (A) 12/29/2022   HGBA1C 10.7 (A) 09/25/2022   GLUCOSE 134 (H) 04/13/2023   MICRALBCREAT 191.9 (H) 03/31/2023   MICRALBCREAT 456.2 (H) 03/27/2022   MICRALBCREAT 185.1 (H) 06/26/2011   CREATININE 1.92 (H) 04/13/2023   CREATININE 2.05 (H) 03/31/2023   CREATININE 1.92 (H) 10/06/2022   GFR 31.39 (L) 03/31/2023   GFR 41.63 (L) 03/27/2022   GFR 36.13 (L) 09/13/2021    Lab Results  Component Value Date   CHOL 157 03/31/2023   LDLCALC 89 03/31/2023   LDLCALC 99 03/27/2022   LDLCALC 102 (H) 12/14/2020   LDLDIRECT 112.0 12/13/2019   LDLDIRECT 102.0  05/25/2017   HDL 35.10 (L) 03/31/2023   TRIG 163.0 (H) 03/31/2023   TRIG 97.0 03/27/2022   TRIG 140.0 12/14/2020   ALT 14 04/13/2023   ALT 15 03/31/2023   AST 17 04/13/2023   AST 17 03/31/2023      Chemistry      Component Value Date/Time   NA 136 04/13/2023 1007   K 3.3 (L) 04/13/2023 1007   CL 103 04/13/2023 1007   CO2 25 04/13/2023 1007   BUN 26 (H) 04/13/2023 1007   CREATININE 1.92 (H) 04/13/2023 1007      Component Value Date/Time   CALCIUM 8.6 (L) 04/13/2023 1007   ALKPHOS 58 04/13/2023 1007   AST 17 04/13/2023 1007   ALT 14 04/13/2023 1007   BILITOT 0.5 04/13/2023 1007       The 10-year ASCVD risk score (Arnett DK, et al., 2019) is: 41.5%  Assessment and Plan:   1. Diabetes, type 2: uncontrolled per  last A1c of 9.8% (03/30/22), relatively unchanged from previous 10%. Reasonable goal <7.5-8% without hypoglycemia given significant comorbidity.  Since last visit, reports increase in appetite back to baseline (lower intake previously resulted in lows/basal insulin reduction).  Current Regimen   Jardiance 25 mg/d, glipizide XL 5 mg BID, Lantus 45 u/d, Trulicity 3 mg/wk  CGM data: BG increased since last visit 2/2 increase in appetite.  Diet: Discussed buying unsweet tea and sweetening w Stevia. Advised to avoid artificial sweetener per CKD.  Exercise: Minimal. Discussed goal of increasing steps (in the winter consider walking around grocery store/mall or treadmill). Consider resistance bands in the home if too cold to make to to the gym. Increase Lantus (glargine) back to 50 units  Increase Trulicity to 4.5 mg weekly dose (once approved for PAP) - Medical exception form submitted today to [employer]  Future Consideration: GLP1-RA: Consider transition to Ozempic for added A1c reduction propensity (prev stopped d/t backorder). Novo PAP. Metformin: Avoid in setting of CKD TZD: Avoiding in the setting of HF Dx Insulin: Continue to maximize non-insulin therapies w goal of  reducing insulin dose given risk hypoglycemia.   2. CKD (G3bA2): Microalbuminuria present. UACR improved though remains elevated 191.9 mg/g (03/31/23), previously 456.2 mg/g (2024). Uncontrolled despite RAAS + SGLT2i therapy, though per nephrology may have been off of RAAS for some time. If continued albuminuria despite good adherence, would be indicated for inc RAAS if tolerated, and finerenone per benefits demonstrated in FIDELIO-DKD and FIGARO-DKD trials (slowed CKD progression, MACE reduction).  Continue medications today without changes. Follows w nephrology Future Consideration: RAAS: Lisinopril - Room to increase to max dose, 40 mg daily. Potassium remains on lower end. Blood pressure may be a limiting factor w furosemide in the setting of CHF though at this time BP remains slightly elevated consistently 130s/80 mmHg. Finerenone: If unable to tolerate Lisinopril titration, would be indicated for Finerenone for CKD in the setting of elevated UACR despite RAAS + SGLT2i. Potassium on low end, good candidate for Finerenone.  10 mg once daily per GFR <60. Titration to full dose 20 mg daily if potassium <4.8 on repeat labs 4 wk after initiation.    3. ASCVD (primary prevention): LDL continues to improve per recent lipid panel 03/31/23 at 89 mg/dL (previously 99, 660) though TG slightly elevated at 191. LDL goal <70 mg/dL (primary prevention, diabetes).  Key risk factors include: diabetes, hypertension, hyperlipidemia, former smoker, BMI >30 kg/m2, and sedentary lifestyle The 10-year ASCVD risk score (Arnett DK, et al., 2019) is: 41.5% indicated patient is at High risk.  Current Regimen: atorvastatin 80 mg daily Continue medications today without changes. Follows w Cardiology   Follow Up Follow up by phone ~2-3 weeks   PCP follow up scheduled ~1.5 month Patient given direct line for questions regarding medication therapy  Future Appointments  Date Time Provider Department Center  06/03/2023  9:30  AM [facility] PHARMACIST [facility]  07/01/2023  8:00 AM [name], MD [facility]  08/17/2023 10:30 AM [facility] None  08/18/2023 10:30 AM [name], MD [facility] None  08/18/2023 10:45 AM [facility] INFUSION CHAIR 1 [facility] None  09/28/2023  9:00 AM [name], MD [facility]    Loree Fee, PharmD Clinical Pharmacist [facility] [phone]

## 2023-05-15 MED ORDER — TRULICITY 4.5 MG/0.5ML ~~LOC~~ SOAJ: SUBCUTANEOUS | 4 refills | Status: DC

## 2023-05-15 MED ORDER — BASAGLAR KWIKPEN 100 UNIT/ML ~~LOC~~ SOPN: PEN_INJECTOR | SUBCUTANEOUS | 4 refills | Status: AC

## 2023-05-15 NOTE — Progress Notes (Signed)
 Form faxed back and confirmation received. Form sent to the front for scanning.

## 2023-05-25 ENCOUNTER — Encounter: Payer: Self-pay | Admitting: Pharmacist

## 2023-05-25 NOTE — Progress Notes (Unsigned)
 Documentation Reason for Call: Patient assistance  Summary: Patient approved for [employer] through 2025 for Basaglar insulin.  Has been denied for Trulicity due to not meeting eligibility criteria for new Trulicity enrollment: Must have tried/failed other GLP1 for a minimum of 3 months within the past 12 months  Follow Up: Phone follow up 4/2 to discuss  Loree Fee, PharmD Clinical Pharmacist [facility] [phone]

## 2023-05-27 ENCOUNTER — Other Ambulatory Visit: Payer: Self-pay | Admitting: Internal Medicine

## 2023-05-27 ENCOUNTER — Encounter: Payer: Self-pay | Admitting: Pharmacist

## 2023-05-27 ENCOUNTER — Other Ambulatory Visit (INDEPENDENT_AMBULATORY_CARE_PROVIDER_SITE_OTHER)

## 2023-05-27 DIAGNOSIS — E1142 Type 2 diabetes mellitus with diabetic polyneuropathy: Secondary | ICD-10-CM

## 2023-05-27 NOTE — Progress Notes (Addendum)
 Patient Assistance Program (PAP) Application   Manufacturer: [employer]    (New enrollment) Medication(s): Ozempic   Patient Portion of Application:  05/27/23: Completed with patient via online enrollment tool. Submitted.  Income Documentation: N/A - Electronic verification elected.  Provider Portion of Application:  05/27/23: Provider portion completed by PharmD and uploaded PCP eFax folder for signature.  Prescription(s): Included in MAP application. Ozempic 1.0 mg weekly (2 boxes) Ozempic 2.0 mg weekly (2 boxes)  APPROVED through 2025  Next Steps: [x]    Patient pages completed and submitted online 05/27/23 [x]    Application filled out and uploaded to [facility] eFax folder for review/signature [x]    Upon PCP signature Application to be faxed to [employer] Fax: [phone] AND scanned into patient chart   Forwarded to [facility] CPhT Patient Advocate Team for future correspondences/re-enrollment.  Note routed to PCP Clinic Pool to ensure PCP signature is obtained and application is faxed.  *[facility] clinic team - Please Addend/update this note as the "Next Steps" are completed in office*

## 2023-05-27 NOTE — Progress Notes (Signed)
 In error

## 2023-05-27 NOTE — Progress Notes (Signed)
 05/27/2023 Name: Adam [name] MRN: [id number] DOB: [birth year]  Subjective  Chief Complaint  Patient presents with   Diabetes   Reason for visit: ?  EADEN HETTINGER is a 73 y.o. male with a history of diabetes (type 2), who presents today for an initial diabetes pharmacotherapy visit.? Pertinent PMH also includes HTN w h/o syncope, CKD, HFpEF, obesity, hx gout, pancreatic cyst, hyperparathyroidism (renal origin), BPH.  Known DM Complications: retinopathy w macular edema, CKD, neuropathy  Care Team: Primary Care Provider: [name], MD Cardiology - [name], MD   Nephrology  Date of Last Diabetes Related Visit: with PCP on 03/31/23 ; 04/15/23 with pharmD Recent Summary of Change: 3/19: Basal 45 ? 50 u/d Previously, appetite was reduced 2/2 sickness resulting in hypoglycemia episodes and subsequent basal reduction though with return of appetite, sugars have increased again w no c/f hypoglyemia. returned to his usual.  2/19: Lilly PAP application with plan for Trulicity increase to 4.5 mg weekly.  04/02/23: ??glipizide XL 5 mg - twice daily 1/22: 10% reduction in basal insulin 50 > 45 units daily 2/2 frequent FBG <70 in the setting of reduced intake 09/25/22: +glipizide XL  Medication Access/Adherence: Prescription drug coverage: Payor: [employer] MEDICARE / Plan: [employer] MEDICARE CHOICE PPO / Product Type: *No Product type* /   Patient Assistance: Yes Counselling psychologist) - denied for Trulicity d/t not meeting exception requirements  [employer] (Jardiance)  Since Last visit / History of Present Illness: ?  Since last visit, patient reports doing well. Has ~4 Trulicity pens remaining. No GI concerns today. Recently denied for Trulicity through [employer]. Open to transitioning back to Ozempic.   Has received basaglar from [facility]. Has not yet received Jardiance from [facility] at this time though has been approved.   Reported DM Regimen: ?  Lantus (glargine) 50 units  daily (night) dulaglutide (Trulicity) 3 mg once weekly Jardiance 25 mg daily Glipizide XL 5 mg - twice daily   DM medications tried in the past:?  Ozempic (stopped due to backorder) Januvia (stopped due to Ozempic start)  SMBG  Libre 3 plus (Reader) : FBG today 124 mg/dL  Libre 3 Report Average glucose Last 7 Days: 235 mg/dL Average glucose by time of day:     12 am - 6 am: 200 (previous 206)       6 am - 12 pm: 181 (previous 203)      12 pm - 6 pm: 247 (previous 295)      6 pm - 12 pm: 323 (previous 334)  Hypo/Hyperglycemia: ?  Symptoms of hypoglycemia since last visit:? no (Previous Sx: tired, eye sight blurry) If yes, it was treated by: n/a  Symptoms of hyperglycemia since last visit:? no - none  Reported Diet: Patient typically eats 2 meals per day.  Breakfast (10 am - glipizide at 9-9:30 am): 2 eggs, sausage, small portion of grits Lunch: Skips Dinner (5-6 pm): Baked chicken or fish, pinto/black beans, cabbage, carrots, broccoli.  Snacks: Occasionally (~1-2 days per week: ice cream cone or cookie) Beverages: Water (~64oz/day), occasionally diet soda, tea (sweet tea - 1/2 gallon lasts ~1week), lemonade, cranberry juice, orange juice (1-2 glasses/week).   Exercise: Ambulates well. Has not got around to starting gym but this is a goal once weather gets warmer. In summer is more active in the yard. Park in town has a track for walking.   DM Prevention:  Statin: Taking; high intensity.?  History of chronic kidney  disease? yes History of albuminuria? yes, last UACR on 03/31/23 = 1919 mg/g (4562 in 2024) ACE/ARB - Taking lisinopril 20 mg daily; Urine MA/CR Ratio - elevated urinary albumin excretion.  Last eye exam: 09/18/2021; Retinopathy Present - DUE Last foot exam: 03/31/2023 Tobacco Use: Former smoker (quit >11yr ago) Immunizations:? Flu: Up to Date (last received 10/26/22) (Last: 10/26/2022); Pneumococcal: Up to Date PPSV23 (2018, 2003) PCV13 (2016); Shingrix:  x1 in 2016,  unclear if 2nd dose received ; Covid (x2 2021, no Booster hx)  Cardiovascular Risk Reduction History of clinical ASCVD? no The 10-year ASCVD risk score (Arnett DK, et al., 2019) is: 41.5% History of heart failure? yes History of hyperlipidemia? yes Current BMI: 34.1 kg/m2 (Ht 5'11.5", Wt 112.7 kg) Taking statin? yes; high intensity (atorvastatin 80mg ) Taking aspirin? not indicated; Not taking   Taking SGLT-2i? yes Taking GLP- 1 RA? yes      _______________________________________________  Objective    Review of Systems:? Limited in the setting of virtual visit  Constitutional:? No fever, chills or unintentional weight loss  Cardiovascular:? No chest pain or pressure, shortness of breath, dyspnea on exertion, orthopnea or LE edema  GI:? No nausea, vomiting, constipation, diarrhea, abdominal pain, dyspepsia, change in bowel habits  Endocrine:? No polyuria, polyphagia or blurred vision    Physical Examination:  Vitals:  Wt Readings from Last 3 Encounters:  04/14/23 248 lb 8 oz (112.7 kg)  03/31/23 247 lb (112 kg)  03/25/23 248 lb (112.5 kg)   BP Readings from Last 3 Encounters:  04/14/23 136/84  03/31/23 130/88  03/25/23 138/80   Pulse Readings from Last 3 Encounters:  04/14/23 78  03/31/23 88  03/25/23 97    Labs:?  Lab Results  Component Value Date   HGBA1C 9.8 (H) 03/31/2023   HGBA1C 10.0 (A) 12/29/2022   HGBA1C 10.7 (A) 09/25/2022   GLUCOSE 134 (H) 04/13/2023   MICRALBCREAT 191.9 (H) 03/31/2023   MICRALBCREAT 456.2 (H) 03/27/2022   MICRALBCREAT 185.1 (H) 06/26/2011   CREATININE 1.92 (H) 04/13/2023   CREATININE 2.05 (H) 03/31/2023   CREATININE 1.92 (H) 10/06/2022   GFR 31.39 (L) 03/31/2023   GFR 41.63 (L) 03/27/2022   GFR 36.13 (L) 09/13/2021    Lab Results  Component Value Date   CHOL 157 03/31/2023   LDLCALC 89 03/31/2023   LDLCALC 99 03/27/2022   LDLCALC 102 (H) 12/14/2020   LDLDIRECT 112.0 12/13/2019   LDLDIRECT 102.0 05/25/2017   HDL 35.10 (L)  03/31/2023   TRIG 163.0 (H) 03/31/2023   TRIG 97.0 03/27/2022   TRIG 140.0 12/14/2020   ALT 14 04/13/2023   ALT 15 03/31/2023   AST 17 04/13/2023   AST 17 03/31/2023      Chemistry      Component Value Date/Time   NA 136 04/13/2023 1007   K 3.3 (L) 04/13/2023 1007   CL 103 04/13/2023 1007   CO2 25 04/13/2023 1007   BUN 26 (H) 04/13/2023 1007   CREATININE 1.92 (H) 04/13/2023 1007      Component Value Date/Time   CALCIUM 8.6 (L) 04/13/2023 1007   ALKPHOS 58 04/13/2023 1007   AST 17 04/13/2023 1007   ALT 14 04/13/2023 1007   BILITOT 0.5 04/13/2023 1007       The 10-year ASCVD risk score (Arnett DK, et al., 2019) is: 41.5%  Assessment and Plan:   1. Diabetes, type 2: uncontrolled. A1c 9.8% (03/30/22), unchanged from previous 10%. Reasonable goal <7.5-8% without hypoglycemia given significant comorbidity. Last visit, hyperglycemia on CGM  2/2 increase in appetite back to baseline (lower intake previously resulted in lows/basal insulin reduction).  Today, hyperglycemia present on CGM report, though improving s/p insulin adjustment. Planned for Trulicity increase though denied by PAP. Expect more benefit from Ozempic, tolerated previously.  Fasting BG today 124, significantly improved so will defer further insulin titration for now though suspect additional glycemic control will be needed prior to Ozempic delivery from PAP.   Current Regimen   Jardiance 25 mg/d, glipizide XL 5 mg BID, Lantus 50 u/d, Trulicity 4.5 mg/wk  Continue current medications Continue with Trulicity 3 mg weekly x 4 weeks then transition to Ozempic 1.0 (per PAP denial for Trulicity).   Future Consideration: Metformin: Avoid in setting of CKD TZD: Avoiding in the setting of HF Dx Insulin: Continue to maximize non-insulin therapies w goal of reducing insulin dose given risk hypoglycemia.   2. CKD (G3bA2): Microalbuminuria present. UACR improved though remains elevated 1919 mg/g (03/31/23), previously 4562 mg/g  (2024). Uncontrolled despite RAAS + SGLT2i therapy, though per nephrology may have been off of RAAS for some time. If continued albuminuria despite good adherence, would be indicated for inc RAAS if tolerated, and finerenone per benefits demonstrated in FIDELIO-DKD and FIGARO-DKD trials (slowed CKD progression, MACE reduction).  Continue medications today without changes. Follows w nephrology Future Consideration: RAAS: Lisinopril - Room to increase to max dose, 40 mg daily. Potassium remains on lower end. Blood pressure may be a limiting factor w furosemide in the setting of CHF though at this time BP remains slightly elevated consistently 130s/80 mmHg. Finerenone: If unable to tolerate lisinopril titration, would be indicated for Finerenone for CKD in the setting of elevated UACR despite RAAS + SGLT2i. Potassium on low end, good candidate for Finerenone.  10 mg once daily per GFR <60. Titration to full dose 20 mg daily if potassium <4.8 on repeat labs 4 wk after initiation.   3. ASCVD (primary prevention): LDL continues to improve per recent lipid panel 03/31/23 at 89 mg/dL (previously 99, 409) though TG slightly elevated at 191. LDL goal <70 mg/dL (primary prevention, diabetes).  Key risk factors include: diabetes, hypertension, hyperlipidemia, former smoker, BMI >30 kg/m2, and sedentary lifestyle The 10-year ASCVD risk score (Arnett DK, et al., 2019) is: 41.5% indicated patient is at High risk.  Current Regimen: atorvastatin 80 mg daily Continue medications today without changes. Follows w Cardiology Ezetimibe reasonable to achieve LDL goal.    Follow Up  PCP follow up scheduled ~1 month PharmD follow up by phone 1 month after PCP visit Patient given direct line for questions regarding medication therapy  Future Appointments  Date Time Provider Department Center  07/01/2023  8:00 AM [name], MD [facility]  07/29/2023  9:30 AM [facility] PHARMACIST [facility]  08/17/2023 10:30 AM  [facility] None  08/18/2023 10:30 AM [name], MD [facility] None  08/18/2023 10:45 AM [facility] INFUSION CHAIR 1 [facility] None  09/28/2023  9:00 AM [name], MD [facility]    Loree Fee, PharmD Clinical Pharmacist [facility] [phone]

## 2023-05-29 ENCOUNTER — Telehealth: Payer: Self-pay | Admitting: Internal Medicine

## 2023-05-29 NOTE — Telephone Encounter (Signed)
 Dr Alphonsus Sias is out of the office today. He can sign it on Monday when he returns.

## 2023-05-29 NOTE — Telephone Encounter (Signed)
 Copied from CRM [id number]. Topic: General - Other >> May 29, 2023 10:32 AM Florestine Avers wrote: Reason for CRM: Norvor Disk Would like to follow up because they received everything but the health provider forms and they can not proceed with the application until that is sent over. A good fax number is going to be [phone].

## 2023-06-01 NOTE — Progress Notes (Cosign Needed Addendum)
 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

## 2023-06-02 NOTE — Progress Notes (Signed)
 Form faxed to [employer] and faxed to the office so it can be scanned into his chart.   Patient Assistance Program (PAP) Application   Manufacturer: [employer]    (New enrollment) Medication(s): Ozempic   Patient Portion of Application:  05/27/23: Completed with patient via online enrollment tool. Submitted.  Income Documentation: N/A - Electronic verification elected.  Provider Portion of Application:  05/27/23: Provider portion completed by PharmD and uploaded PCP eFax folder for signature.  Prescription(s): Included in MAP application. Ozempic 1.0 mg weekly (2 boxes) Ozempic 2.0 mg weekly (2 boxes)  Next Steps: [x]    Patient pages completed and submitted online 05/27/23 [x]    Application filled out and uploaded to [facility] eFax folder for review/signature [x]    Upon PCP signature Application to be faxed to [employer] Fax: [phone] AND scanned into patient chart   Forwarded to [facility] CPhT Patient Advocate Team for future correspondences/re-enrollment.  Note routed to PCP Clinic Pool to ensure PCP signature is obtained and application is faxed.  *[facility] clinic team - Please Addend/update this note as the "Next Steps" are completed in office*

## 2023-06-03 ENCOUNTER — Other Ambulatory Visit

## 2023-06-18 ENCOUNTER — Telehealth: Payer: Self-pay

## 2023-06-18 NOTE — Telephone Encounter (Signed)
 Spoke to pt and wife. Advised there is not a way for them to be sent to him through the program.

## 2023-06-18 NOTE — Telephone Encounter (Signed)
 Contacted pt to inform him of the 2 boxes of Ozempic  shipped to the office and ready for pick up.  Pt states that he will pick up medication today.

## 2023-06-18 NOTE — Telephone Encounter (Signed)
 Patient came by to pick up medication. He was wanting to know if there was a way that this medication could be sent to his house in the future.

## 2023-06-25 DIAGNOSIS — H35351 Cystoid macular degeneration, right eye: Secondary | ICD-10-CM | POA: Diagnosis not present

## 2023-06-25 DIAGNOSIS — H40043 Steroid responder, bilateral: Secondary | ICD-10-CM | POA: Diagnosis not present

## 2023-06-25 DIAGNOSIS — H35373 Puckering of macula, bilateral: Secondary | ICD-10-CM | POA: Diagnosis not present

## 2023-06-25 DIAGNOSIS — H2000 Unspecified acute and subacute iridocyclitis: Secondary | ICD-10-CM | POA: Diagnosis not present

## 2023-06-29 ENCOUNTER — Other Ambulatory Visit: Payer: Self-pay | Admitting: Internal Medicine

## 2023-07-01 ENCOUNTER — Ambulatory Visit: Payer: Medicare PPO | Admitting: Internal Medicine

## 2023-07-01 ENCOUNTER — Encounter: Payer: Self-pay | Admitting: Internal Medicine

## 2023-07-01 VITALS — BP 136/88 | HR 60 | Temp 98.4°F | Ht 71.5 in | Wt 250.0 lb

## 2023-07-01 DIAGNOSIS — E1142 Type 2 diabetes mellitus with diabetic polyneuropathy: Secondary | ICD-10-CM | POA: Diagnosis not present

## 2023-07-01 DIAGNOSIS — I5032 Chronic diastolic (congestive) heart failure: Secondary | ICD-10-CM | POA: Diagnosis not present

## 2023-07-01 DIAGNOSIS — J22 Unspecified acute lower respiratory infection: Secondary | ICD-10-CM | POA: Insufficient documentation

## 2023-07-01 DIAGNOSIS — Z7984 Long term (current) use of oral hypoglycemic drugs: Secondary | ICD-10-CM | POA: Diagnosis not present

## 2023-07-01 DIAGNOSIS — Z794 Long term (current) use of insulin: Secondary | ICD-10-CM | POA: Diagnosis not present

## 2023-07-01 DIAGNOSIS — E119 Type 2 diabetes mellitus without complications: Secondary | ICD-10-CM | POA: Diagnosis not present

## 2023-07-01 LAB — POCT GLYCOSYLATED HEMOGLOBIN (HGB A1C): Hemoglobin A1C: 10.6 % — AB (ref 4.0–5.6)
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
## 2023-07-01 NOTE — Assessment & Plan Note (Signed)
 Lab Results  Component Value Date   HGBA1C 10.6 (A) 07/01/2023   Worse than last time His time in range has improved markedly in the past week--since starting ozempic . Will have pharmacist work to get him to 2mg .  Lantus  50. Glipizide  5 bid, jardiance  25

## 2023-07-01 NOTE — Assessment & Plan Note (Signed)
 Has lower respiratory infection--seems to be self limited but with mild bronchospasm If not better by next week, will empirically treat with z-pak and short course of prednisione

## 2023-07-01 NOTE — Assessment & Plan Note (Signed)
 No exacerbation and weight is stable on furosemide  20

## 2023-07-03 ENCOUNTER — Other Ambulatory Visit: Payer: Self-pay | Admitting: Internal Medicine

## 2023-07-03 ENCOUNTER — Other Ambulatory Visit: Payer: Self-pay | Admitting: Cardiovascular Disease

## 2023-07-03 DIAGNOSIS — I82401 Acute embolism and thrombosis of unspecified deep veins of right lower extremity: Secondary | ICD-10-CM

## 2023-07-03 NOTE — Telephone Encounter (Signed)
 Prescription refill request for Eliquis  received. Indication:dvt Last office visit:9/24 Scr:1.92  2/25 Age: 73 Weight:113.4  kg  Prescription refilled

## 2023-07-07 ENCOUNTER — Other Ambulatory Visit (INDEPENDENT_AMBULATORY_CARE_PROVIDER_SITE_OTHER): Admitting: Pharmacist

## 2023-07-07 DIAGNOSIS — E1142 Type 2 diabetes mellitus with diabetic polyneuropathy: Secondary | ICD-10-CM

## 2023-07-07 NOTE — Progress Notes (Unsigned)
 07/08/2023 Name: Adam [name] MRN: [id number] DOB: 1950-03-28  Subjective  Chief Complaint  Patient presents with   Diabetes   Reason for visit: ?  Adam [name] is a 73 y.o. male with a history of diabetes (type 2), who presents today for an initial diabetes pharmacotherapy visit.? Pertinent PMH also includes HTN w h/o syncope, CKD, HFpEF, obesity, hx gout, pancreatic cyst, hyperparathyroidism (renal origin), BPH.  Known DM Complications: retinopathy w macular edema, CKD, neuropathy  Care Team: Primary Care Provider: Helaine Llanos, MD Cardiology - Wenona Hamilton, MD   Nephrology  Date of Last Diabetes Related Visit: with PCP on 03/31/23; 04/15/23 with pharmD Recent Summary of Change: 3/19: Basal 45 ? 50 u/d Previously, appetite was reduced 2/2 sickness resulting in hypoglycemia episodes and subsequent basal reduction though with return of appetite, sugars have increased again w no c/f hypoglyemia. returned to his usual.  2/19: Lilly PAP application with plan for Trulicity  increase to 4.5 mg weekly.  04/02/23: ??glipizide  XL 5 mg - twice daily 1/22: 10% reduction in basal insulin  50 > 45 units daily 2/2 frequent FBG <70 in the setting of reduced intake 09/25/22: +glipizide  XL  Medication Access/Adherence: Prescription drug coverage: Payor: HUMANA MEDICARE / Plan: HUMANA MEDICARE CHOICE PPO / Product Type: *No Product type* /   Patient Assistance: Yes [name] (Basaglar ) - denied for Trulicity  d/t not meeting exception requirements  BI Cares (Jardiance )  Since Last visit / History of Present Illness: ?  Since last visit, patient reports doing well. Reports using third Ozempic  1.0 mg injection on Sunday. 1 dose remaining. Confirms possession of one box of 2.0 mg dose at home currently. No concerns for side effects since transition from Trulicity. Feels sugars have started to come down.  Reported DM Regimen: ?  Lantus (glargine) 50 units daily (night) Ozempic 1.0 mg  weekly (Sun - 4th dose due 5/18) Jardiance 25 mg daily Glipizide XL 5 mg - twice daily   DM medications tried in the past:?  Ozempic (stopped due to backorder previusly) Januvia (stopped due to Ozempic start)  SMBG Libre 3 plus (Reader):  FBG this past ~week: 111, 91, 94, 108, 111, 114, 78, 126, 85   Libre 3 Report Average glucose Last 7 Days:  177 mg/dL Average glucose by time of day:     12 am - 6 am: 150       6 am - 12 pm: 137      12  pm - 6 pm: 197      6 pm - 12 pm: 231   Hypo/Hyperglycemia: ?  Symptoms of hypoglycemia since last visit:?One low sugar (69 mg/dL) overnight  If yes, it was treated by: Juice, small bowl of cereal.  Symptoms of hyperglycemia since last visit:? no - none  Reported Diet: Patient typically eats 2 meals per day.  Breakfast (10 am - glipizide  at 9-9:30 am): 2 eggs, sausage, small portion of grits Lunch: Skips Dinner (5-6 pm): Baked chicken or fish, pinto/black beans, cabbage, carrots, broccoli.  Snacks: Occasionally (~1-2 days per week: ice cream cone or cookie) Beverages: Water (~64oz/day), occasionally diet soda, tea (sweet tea - 1/2 gallon lasts ~1week), lemonade, cranberry juice, orange juice (1-2 glasses/week).   Exercise: Ambulates well. Has not got around to starting gym but this is a goal once weather gets warmer. In summer is more active in the yard. Park in town has a track for walking.   DM Prevention:  Statin: Taking; high intensity.?  History of chronic kidney disease? yes History of albuminuria? yes, last UACR on 03/31/23 = 1919 mg/g (4562 in 2024) ACE/ARB - Taking lisinopril  20 mg daily; Urine MA/CR Ratio - elevated urinary albumin excretion.  Last eye exam: 09/18/2021; Retinopathy Present - DUE Last foot exam: 03/31/2023 Tobacco Use: Former smoker (quit >98yr ago) Immunizations:? Flu: Up to Date (last received 10/26/22) (Last: 10/26/2022); Pneumococcal: Up to Date PPSV23 (2018, 2003) PCV13 (2016); Shingrix: x1 in 2016, unclear if 2nd  dose received; Covid (x2 2021, no Booster hx)  Cardiovascular Risk Reduction History of clinical ASCVD? no The 10-year ASCVD risk score (Arnett DK, et al., 2019) is: 41.5% History of heart failure? yes History of hyperlipidemia? yes Current BMI: 34.1 kg/m2 (Ht 5'11.5", Wt 112.7 kg) Taking statin? yes; high intensity (atorvastatin  80mg ) Taking aspirin ? not indicated; Not taking   Taking SGLT-2i? yes Taking GLP- 1 RA? yes      _______________________________________________  Objective    Review of Systems:? Limited in the setting of virtual visit  Constitutional:? No fever, chills or unintentional weight loss  Cardiovascular:? No chest pain or pressure, shortness of breath, dyspnea on exertion, orthopnea or LE edema  GI:? No nausea, vomiting, constipation, diarrhea, abdominal pain, dyspepsia, change in bowel habits  Endocrine:? No polyuria, polyphagia or blurred vision    Physical Examination:  Vitals:  Wt Readings from Last 3 Encounters:  07/01/23 250 lb (113.4 kg)  04/14/23 248 lb 8 oz (112.7 kg)  03/31/23 247 lb (112 kg)   BP Readings from Last 3 Encounters:  07/01/23 136/88  04/14/23 136/84  03/31/23 130/88   Pulse Readings from Last 3 Encounters:  07/01/23 60  04/14/23 78  03/31/23 88    Labs:?  Lab Results  Component Value Date   HGBA1C 10.6 (A) 07/01/2023   HGBA1C 9.8 (H) 03/31/2023   HGBA1C 10.0 (A) 12/29/2022   GLUCOSE 134 (H) 04/13/2023   MICRALBCREAT 191.9 (H) 03/31/2023   MICRALBCREAT 456.2 (H) 03/27/2022   MICRALBCREAT 185.1 (H) 06/26/2011   CREATININE 1.92 (H) 04/13/2023   CREATININE 2.05 (H) 03/31/2023   CREATININE 1.92 (H) 10/06/2022   GFR 31.39 (L) 03/31/2023   GFR 41.63 (L) 03/27/2022   GFR 36.13 (L) 09/13/2021    Lab Results  Component Value Date   CHOL 157 03/31/2023   LDLCALC 89 03/31/2023   LDLCALC 99 03/27/2022   LDLCALC 102 (H) 12/14/2020   LDLDIRECT 112.0 12/13/2019   LDLDIRECT 102.0 05/25/2017   HDL 35.10 (L) 03/31/2023   TRIG  163.0 (H) 03/31/2023   TRIG 97.0 03/27/2022   TRIG 140.0 12/14/2020   ALT 14 04/13/2023   ALT 15 03/31/2023   AST 17 04/13/2023   AST 17 03/31/2023      Chemistry      Component Value Date/Time   NA 136 04/13/2023 1007   K 3.3 (L) 04/13/2023 1007   CL 103 04/13/2023 1007   CO2 25 04/13/2023 1007   BUN 26 (H) 04/13/2023 1007   CREATININE 1.92 (H) 04/13/2023 1007      Component Value Date/Time   CALCIUM  8.6 (L) 04/13/2023 1007   ALKPHOS 58 04/13/2023 1007   AST 17 04/13/2023 1007   ALT 14 04/13/2023 1007   BILITOT 0.5 04/13/2023 1007       The 10-year ASCVD risk score (Arnett DK, et al., 2019) is: 41.5%  Assessment and Plan:   1. Diabetes, type 2: uncontrolled. A1c 10.6% (07/01/23), worsened from previous 9.8% (though is reflective of months prior to recent medication changes). Reasonable  goal <7.5-8% without hypoglycemia given significant comorbidity.  Tolerating Ozempic  well and due to titrate next weekend (5/25). On CGM report today, sugars continue to improve week-to-week. FBG very well-controlled with one instance of BG 69 mg/dL. ABG remains slightly elevated (177 mg/dL, roughly translates to A1c 7.8%) 2/2 evening hyperglycemia surrounding meal choices. Ideally, Ozempic  titration this month will promote improvement in PP glycemic control.  No change to glipizide  for now given no c/f lows mid-day and need for the PP coverage. Will likely plan for small basal reduction with Ozemic titration given FBG very well controlled and approaching hypoglycemia. ~5-10% reduction based on FBG.   Current Regimen   Jardiance  25 mg/d, glipizide  XL 5 mg BID, Lantus  50 u/d, Ozempic  1.0 mg/wk  Continue current medications Continue Ozempic  1.0 mg weekly (last dose Sun 5/18) Increase Ozempic  to 2.0 mg weekly starting 5/25) Phone check-in with PharmD scheduled prior as to discuss need for insulin  reduction Future Consideration: Metformin : Avoid in setting of CKD TZD: Avoiding in the setting of  CHF Dx Insulin : Continue to maximize non-insulin  therapies w goal of reducing insulin  dose given risk hypoglycemia.   2. CKD (G3bA2): Microalbuminuria present. UACR improved though remains elevated 1919 mg/g (03/31/23), previously 4562 mg/g (2024). Uncontrolled despite RAAS + SGLT2i therapy, though per nephrology may have been off of RAAS for some time. If continued albuminuria despite good adherence, would be indicated for inc RAAS if tolerated, and finerenone per benefits demonstrated in FIDELIO-DKD and FIGARO-DKD trials (slowed CKD progression, MACE reduction). Defer to nephrology, continue to follow renal labs.  Continue medications today without changes. Follows w nephrology Future Consideration: RAAS: Lisinopril  - Room to increase to max dose, 40 mg daily. Potassium remains on lower end. Blood pressure may be a limiting factor w furosemide  in the setting of CHF though at this time BP remains slightly elevated consistently 130s/80 mmHg. Finerenone: If unable to tolerate lisinopril  titration, would be indicated for Finerenone for CKD in the setting of elevated UACR despite RAAS + SGLT2i. Potassium on low end, good candidate for Finerenone.  10 mg once daily per GFR <60. Titration to full dose 20 mg daily if potassium <4.8 on repeat labs 4 wk after initiation.   3. ASCVD (primary prevention): LDL continues to improve per recent lipid panel 03/31/23 at 89 mg/dL (previously 99, 829). TG slightly elevated at 191. LDL goal <70 mg/dL (primary prevention, diabetes).  Key risk factors include: diabetes, hypertension, hyperlipidemia, former smoker, BMI >30 kg/m2, and sedentary lifestyle The 10-year ASCVD risk score (Arnett DK, et al., 2019) is: 41.5% indicated patient is at High risk.  Current Regimen: atorvastatin  80 mg daily Continue medications today without changes. Follows w Cardiology Future Consideration: +Ezetimibe reasonable to achieve LDL goal  Follow Up  PCP follow up scheduled ~3 month PharmD  follow up by phone 1 week to review basal insulin  needs in setting of Ozempic  titration Patient given direct line for questions regarding medication therapy  Future Appointments  Date Time Provider Department Center  07/16/2023  9:30 AM [facility] [place] PHARMACIST [facility]  07/29/2023  9:30 AM [facility] PHARMACIST [facility]  08/17/2023 10:30 AM [facility] None  08/18/2023 10:30 AM Timmy Forbes, MD [facility] None  08/18/2023 10:45 AM [facility] INFUSION CHAIR 1 [facility] None  10/01/2023  9:30 AM Helaine Llanos, MD [facility]    Daron Ellen, PharmD Clinical Pharmacist [facility] [phone]

## 2023-07-15 ENCOUNTER — Other Ambulatory Visit

## 2023-07-16 ENCOUNTER — Encounter: Payer: Self-pay | Admitting: Pharmacist

## 2023-07-16 ENCOUNTER — Other Ambulatory Visit (INDEPENDENT_AMBULATORY_CARE_PROVIDER_SITE_OTHER): Admitting: Pharmacist

## 2023-07-16 DIAGNOSIS — E1142 Type 2 diabetes mellitus with diabetic polyneuropathy: Secondary | ICD-10-CM

## 2023-07-16 NOTE — Progress Notes (Signed)
 07/16/2023 Name: Adam [name] MRN: [id number] DOB: 07/20/1950  Subjective  No chief complaint on file.  Reason for visit: ?  Adam [name] is a 73 y.o. male with a history of diabetes (type 2), who presents today for an initial diabetes pharmacotherapy visit.? Pertinent PMH also includes HTN w h/o syncope, CKD, HFpEF, obesity, hx gout, pancreatic cyst, hyperparathyroidism (renal origin), BPH.  Known DM Complications: retinopathy w macular edema, CKD, neuropathy  Care Team: Primary Care Provider: Helaine Llanos, MD Cardiology - Wenona Hamilton, MD   Nephrology   Since Last visit / History of Present Illness: ?  Since last visit, patient reports doing well. Adherent to Ozempic  1.0 mg, with plan to increase to 2.0 mg dose this Sunday. Feels sugars have continued to come down, though remain higher through the afternoon. Starting to note occasional low alarms (68-69 mg/dL, none lower) overnight to early morning ~5am. No low alarms during waking hours.    Reported DM Regimen: ?  Lantus (glargine) 50 units daily (night) Ozempic 1.0 mg weekly (Sun - 4th dose due 5/18) Jardiance 25 mg daily Glipizide XL 5 mg - twice daily   DM medications tried in the past:?  Ozempic (stopped due to backorder previusly) Januvia (stopped due to Ozempic start)  SMBG Libre 3 plus (Reader):  FBG this morning 68 mg/dL  Time in target (Last 7 Days): Above 46%; In target 54%; Below 0% Average glucose (Last 7 Days): 178 mg/dL Average glucose by time of day:     12 am - 6 am: 142       6 am - 12 pm: 141      12  pm - 6 pm: 206      6 pm - 12 pm: 232  Low events detected on Libre: 0   Hypo/Hyperglycemia: ?  Symptoms of hypoglycemia since last visit:?Total of 3-4 low sugar alarms (69 mg/dL) early morning. None lower.  If yes, it was treated by: Juice, small bowl of cereal.  Symptoms of hyperglycemia since last visit:? no - none  Reported Diet: Patient typically eats 2 meals per day.  Breakfast  (10 am - glipizide  at 9-9:30 am): 2 eggs, sausage, small portion of grits Lunch: Skips Dinner (5-6 pm): Baked chicken or fish, pinto/black beans, cabbage, carrots, broccoli.  Snacks: Occasionally (~1-2 days per week: ice cream cone or cookie) Beverages: Water (~64oz/day), occasionally diet soda, tea (sweet tea - 1/2 gallon lasts ~1week), lemonade, cranberry juice, orange juice (1-2 glasses/week).   Exercise: Ambulates well. Has not got around to starting gym but this is a goal once weather gets warmer. In summer is more active in the yard. Park in town has a track for walking.   DM Prevention:  Statin: Taking; high intensity.?  History of chronic kidney disease? yes History of albuminuria? yes, last UACR on 03/31/23 = 1919 mg/g (4562 in 2024) ACE/ARB - Taking lisinopril  20 mg daily; Urine MA/CR Ratio - elevated urinary albumin excretion.  Last eye exam: 09/18/2021; Retinopathy Present - DUE Last foot exam: 03/31/2023 Tobacco Use: Former smoker (quit >39yr ago) Immunizations:? Flu: Up to Date (last received 10/26/22) (Last: 10/26/2022); Pneumococcal: Up to Date PPSV23 (2018, 2003) PCV13 (2016); Shingrix: x1 in 2016, unclear if 2nd dose received; Covid (x2 2021, no Booster hx)  Cardiovascular Risk Reduction History of clinical ASCVD? no The 10-year ASCVD risk score (Arnett DK, et al., 2019) is: 41.5% History of heart failure? yes History of hyperlipidemia? yes Current BMI: 34.1 kg/m2 (Ht 5'11.5",  Wt 112.7 kg) Taking statin? yes; high intensity (atorvastatin  80mg ) Taking aspirin ? not indicated; Not taking   Taking SGLT-2i? yes Taking GLP- 1 RA? yes      _______________________________________________  Objective    Review of Systems:? Limited in the setting of virtual visit  Constitutional:? No fever, chills or unintentional weight loss  Cardiovascular:? No chest pain or pressure, shortness of breath, dyspnea on exertion, orthopnea or LE edema  GI:? No nausea, vomiting, constipation,  diarrhea, abdominal pain, dyspepsia, change in bowel habits  Endocrine:? No polyuria, polyphagia or blurred vision    Physical Examination:  Vitals:  Wt Readings from Last 3 Encounters:  07/01/23 250 lb (113.4 kg)  04/14/23 248 lb 8 oz (112.7 kg)  03/31/23 247 lb (112 kg)   BP Readings from Last 3 Encounters:  07/01/23 136/88  04/14/23 136/84  03/31/23 130/88   Pulse Readings from Last 3 Encounters:  07/01/23 60  04/14/23 78  03/31/23 88    Labs:?  Lab Results  Component Value Date   HGBA1C 10.6 (A) 07/01/2023   HGBA1C 9.8 (H) 03/31/2023   HGBA1C 10.0 (A) 12/29/2022   GLUCOSE 134 (H) 04/13/2023   MICRALBCREAT 191.9 (H) 03/31/2023   MICRALBCREAT 456.2 (H) 03/27/2022   MICRALBCREAT 185.1 (H) 06/26/2011   CREATININE 1.92 (H) 04/13/2023   CREATININE 2.05 (H) 03/31/2023   CREATININE 1.92 (H) 10/06/2022   GFR 31.39 (L) 03/31/2023   GFR 41.63 (L) 03/27/2022   GFR 36.13 (L) 09/13/2021   Lab Results  Component Value Date   CHOL 157 03/31/2023   LDLCALC 89 03/31/2023   LDLCALC 99 03/27/2022   LDLCALC 102 (H) 12/14/2020   LDLDIRECT 112.0 12/13/2019   LDLDIRECT 102.0 05/25/2017   HDL 35.10 (L) 03/31/2023   TRIG 163.0 (H) 03/31/2023   TRIG 97.0 03/27/2022   TRIG 140.0 12/14/2020   ALT 14 04/13/2023   ALT 15 03/31/2023   AST 17 04/13/2023   AST 17 03/31/2023      Chemistry      Component Value Date/Time   NA 136 04/13/2023 1007   K 3.3 (L) 04/13/2023 1007   CL 103 04/13/2023 1007   CO2 25 04/13/2023 1007   BUN 26 (H) 04/13/2023 1007   CREATININE 1.92 (H) 04/13/2023 1007      Component Value Date/Time   CALCIUM  8.6 (L) 04/13/2023 1007   ALKPHOS 58 04/13/2023 1007   AST 17 04/13/2023 1007   ALT 14 04/13/2023 1007   BILITOT 0.5 04/13/2023 1007        The 10-year ASCVD risk score (Arnett DK, et al., 2019) is: 41.5%  Assessment and Plan:   1. Diabetes, type 2: uncontrolled. A1c 10.6% (07/01/23), worsened from previous 9.8% (though is reflective of months  prior to recent medication changes). Reasonable goal <7.5-8% without hypoglycemia given significant comorbidity. PPBG continue to be largest barrier to glycemic control with ABG in the afternoon/late evening >200s on average. Hopeful that Ozempic  titration this weekend will promote improvement in PP glycemic control.  No change to glipizide  given no c/f lows mid-day and need for the PP coverage.  BG remains uncontrolled though with increasing frequency of readings at 69 mg/dL overnight. With this, will plan for conservative reduction of 2 units with Ozempic  increase.  Current Regimen   Jardiance  25 mg/d, glipizide  XL 5 mg BID, Lantus  50 u/d, Ozempic  1.0 mg/wk   5/25: Increase Ozempic  to 2.0 mg weekly 5/25: Decrease Tresiba 50u ? 48 unit daily.  If still having borderline readings close to  70 after 5 days, reduce further ? 46 units daily   Phone check-in with PharmD scheduled prior as to discuss need for insulin  reduction Lantus  ? Tresiba on next PAP refill form  Already approved for Novo PAP. May benefit from longer-acting basal in place of Lantus . May have some degree of Lantus  wearing off aligning with the time of his PPhyperglcyemia in the afternoons/evenings prior to next Lantus  dose.  Future Consideration: Metformin : Avoid in setting of CKD TZD: Avoiding in the setting of CHF Dx  Follow Up  PCP follow up scheduled ~2.5 month PharmD follow up by phone 1.5 week to review basal insulin  needs in setting of Ozempic  titration Patient given direct line for questions regarding medication therapy  Future Appointments  Date Time Provider Department Center  07/29/2023  9:30 AM [facility] PHARMACIST [facility]  08/17/2023 10:30 AM [facility] None  08/18/2023 10:30 AM Timmy Forbes, MD [facility] None  08/18/2023 10:45 AM [facility]ION [facility]IR 1 [facility] None  10/01/2023  9:30 AM Helaine Llanos, MD [facility]    Daron Ellen, PharmD Clinical Pharmacist [facility] [phone]

## 2023-07-16 NOTE — Plan of Care (Signed)
Form printed and placed in Dr Letvak's inbox on his desk. 

## 2023-07-16 NOTE — Progress Notes (Signed)
 Patient Assistance Program (PAP) Application   Manufacturer: Novo Nordisk  (Refill request/New Medicine Request) Medication(s):  Ozempic  2.0 mg (4 boxes) Horace Lye (to replace glargine)  NovoFine needles   Refill request form uploaded to PCP eFax folder for review/signature.

## 2023-07-17 DIAGNOSIS — H2000 Unspecified acute and subacute iridocyclitis: Secondary | ICD-10-CM | POA: Diagnosis not present

## 2023-07-17 DIAGNOSIS — H40053 Ocular hypertension, bilateral: Secondary | ICD-10-CM | POA: Diagnosis not present

## 2023-07-17 DIAGNOSIS — H40043 Steroid responder, bilateral: Secondary | ICD-10-CM | POA: Diagnosis not present

## 2023-07-29 ENCOUNTER — Other Ambulatory Visit (INDEPENDENT_AMBULATORY_CARE_PROVIDER_SITE_OTHER): Admitting: Pharmacist

## 2023-07-29 DIAGNOSIS — E1142 Type 2 diabetes mellitus with diabetic polyneuropathy: Secondary | ICD-10-CM

## 2023-07-29 NOTE — Progress Notes (Signed)
 07/29/2023 Name: Adam [name] MRN: [id number] DOB: 15-Apr-1950  Subjective  Chief Complaint  Patient presents with   Diabetes   Reason for visit: ?  Adam [name] is a 73 y.o. male with a history of diabetes (type 2), who presents today for an initial diabetes pharmacotherapy visit.? Pertinent PMH also includes HTN w h/o syncope, CKD, HFpEF, obesity, hx gout, pancreatic cyst, hyperparathyroidism (renal origin), BPH.  Known DM Complications: retinopathy w macular edema, CKD, neuropathy  Care Team: Primary Care Provider: Helaine Llanos, MD Cardiology - Wenona Hamilton, MD   Nephrology  Date of Last Diabetes Related Visit: with PCP on 03/31/23; 04/15/23 with pharmD Recent Summary of Change: 5/22: ??Ozempic  2mg /wk, ??Lantus  to 48u/d.  3/19: ??Lantus  45 ? 50 u/d Trulicity  ? Ozempic  per [employer] PAP denial 04/02/23: ??glipizide  XL 5 mg - twice daily 1/22: 10% reduction in basal insulin  50 > 45 units daily 2/2 frequent FBG <70 in the setting of reduced intake 09/25/22: +glipizide  XL  Medication Access/Adherence: Prescription drug coverage: Payor: [employer] MEDICARE / Plan: [employer] MEDICARE CHOICE PPO / Product Type: *No Product type* /   Patient Assistance: Yes [name] (Basaglar ) - denied for Trulicity  d/t not meeting exception requirements  BI Cares (Jardiance ) NovoCares (Ozempic )  Since Last visit / History of Present Illness: ?  Since last visit, patient reports doing well. Initially thought he was out of Ozempic  1.0 mg and had planned for 2.0 mg increase, though reports he found another 1.0 mg box in his fridge and has continued with this dose (2 injections remaining). He did however reduce insulin  despite not increasing Ozempic  dose so sugars have been slightly higher.    Reported DM Regimen: ?  Lantus  (glargine) 48 units daily (night) (continued 1.0 mg dose, 2 injections remain) Jardiance  25 mg daily Glipizide  XL 5 mg - twice daily   DM medications tried in the past:?   Ozempic  (stopped due to backorder previusly) Januvia  (stopped due to Ozempic  start)  SMBG Libre 3 plus (Reader):  FBG this week: 93, 120, 150, 116, 97 this morning  Average glucose Last 7 Days:  228 mg/dL Average glucose by time of day:     12 am - 6 am: 186       6 am - 12 pm: 179      12 pm - 6 pm: 249      6 pm - 12 pm: 310   Hypo/Hyperglycemia: ?  Symptoms of hypoglycemia since last visit:?No Symptoms of hyperglycemia since last visit:? no   Reported Diet: Patient typically eats 2 meals per day.  Breakfast (10 am - glipizide  at 9-9:30 am): 2 eggs, sausage, small portion of grits Lunch: Skips Dinner (5-6 pm): Baked chicken or fish, pinto/black beans, cabbage, carrots, broccoli.  Snacks: Occasionally (~1-2 days per week: ice cream cone or cookie) Beverages: Water (~64oz/day), occasionally diet soda, tea (sweet tea - 1/2 gallon lasts ~1week), lemonade, cranberry juice, orange juice (1-2 glasses/week).   Exercise: Ambulates well. Has not got around to starting gym but this is a goal once weather gets warmer. In summer is more active in the yard. Park in town has a track for walking.   DM Prevention:  Statin: Taking; high intensity.?  History of chronic kidney disease? yes History of albuminuria? yes, last UACR on 03/31/23 = 1919 mg/g (4562 in 2024) ACE/ARB - Taking lisinopril  20 mg daily; Urine MA/CR Ratio - elevated urinary albumin excretion.  Last eye exam: 09/18/2021; Retinopathy Present - DUE Last  foot exam: 03/31/2023 Tobacco Use: Former smoker (quit >76yr ago) Immunizations:? Flu: Up to Date (last received 10/26/22) (Last: 10/26/2022); Pneumococcal: Up to Date PPSV23 (2018, 2003) PCV13 (2016); Shingrix: x1 in 2016, unclear if 2nd dose received; Covid (x2 2021, no Booster hx)  Cardiovascular Risk Reduction History of clinical ASCVD? no The 10-year ASCVD risk score (Arnett DK, et al., 2019) is: 41.5% History of heart failure? yes History of hyperlipidemia? yes Current BMI:  34.1 kg/m2 (Ht 5'11.5", Wt 112.7 kg) Taking statin? yes; high intensity (atorvastatin  80mg ) Taking aspirin ? not indicated; Not taking   Taking SGLT-2i? yes Taking GLP- 1 RA? yes      _______________________________________________  Objective    Review of Systems:? Limited in the setting of virtual visit  Constitutional:? No fever, chills or unintentional weight loss  Cardiovascular:? No chest pain or pressure, shortness of breath, dyspnea on exertion, orthopnea or LE edema  GI:? No nausea, vomiting, constipation, diarrhea, abdominal pain, dyspepsia, change in bowel habits  Endocrine:? No polyuria, polyphagia or blurred vision    Physical Examination:  Vitals:  Wt Readings from Last 3 Encounters:  07/01/23 250 lb (113.4 kg)  04/14/23 248 lb 8 oz (112.7 kg)  03/31/23 247 lb (112 kg)   BP Readings from Last 3 Encounters:  07/01/23 136/88  04/14/23 136/84  03/31/23 130/88   Pulse Readings from Last 3 Encounters:  07/01/23 60  04/14/23 78  03/31/23 88    Labs:?  Lab Results  Component Value Date   HGBA1C 10.6 (A) 07/01/2023   HGBA1C 9.8 (H) 03/31/2023   HGBA1C 10.0 (A) 12/29/2022   GLUCOSE 134 (H) 04/13/2023   MICRALBCREAT 191.9 (H) 03/31/2023   MICRALBCREAT 456.2 (H) 03/27/2022   MICRALBCREAT 185.1 (H) 06/26/2011   CREATININE 1.92 (H) 04/13/2023   CREATININE 2.05 (H) 03/31/2023   CREATININE 1.92 (H) 10/06/2022   GFR 31.39 (L) 03/31/2023   GFR 41.63 (L) 03/27/2022   GFR 36.13 (L) 09/13/2021    Lab Results  Component Value Date   CHOL 157 03/31/2023   LDLCALC 89 03/31/2023   LDLCALC 99 03/27/2022   LDLCALC 102 (H) 12/14/2020   LDLDIRECT 112.0 12/13/2019   LDLDIRECT 102.0 05/25/2017   HDL 35.10 (L) 03/31/2023   TRIG 163.0 (H) 03/31/2023   TRIG 97.0 03/27/2022   TRIG 140.0 12/14/2020   ALT 14 04/13/2023   ALT 15 03/31/2023   AST 17 04/13/2023   AST 17 03/31/2023      Chemistry      Component Value Date/Time   NA 136 04/13/2023 1007   K 3.3 (L)  04/13/2023 1007   CL 103 04/13/2023 1007   CO2 25 04/13/2023 1007   BUN 26 (H) 04/13/2023 1007   CREATININE 1.92 (H) 04/13/2023 1007      Component Value Date/Time   CALCIUM  8.6 (L) 04/13/2023 1007   ALKPHOS 58 04/13/2023 1007   AST 17 04/13/2023 1007   ALT 14 04/13/2023 1007   BILITOT 0.5 04/13/2023 1007       The 10-year ASCVD risk score (Arnett DK, et al., 2019) is: 41.5%  Assessment and Plan:   1. Diabetes, type 2: uncontrolled. A1c 10.6% (07/01/23), worsened from previous 9.8% (though is reflective of months prior to recent medication changes). Reasonable goal <7.5-8% without hypoglycemia given significant comorbidity.  Notably, 2 weeks ago reduced basal 2u in anticipation of Ozempic  increase, though he has continued 1.0 mg Ozempic  dose as he found an additional box in the fridge. Will finish 1.0 mg supply, though increase insulin   back to 50 units until Ozempic  titration. Further titration limited by borderline lows on previous report ~69 mg/dL).  Current regimen: Jardiance  25 mg/d, glipizide  XL 5 mg BID, Lantus  48 u/d, Ozempic  1.0 mg/wk ABG increased from 2 weeks ago d/t not increasing Ozempic  but still reducing insulin . Resume previous Lanuts dose, 50 units daily.  Finish Ozempic  1.0 mg (6/8, 6/15), then increase to 2.0 mg thereafter (6/22) No change to glipizide  for now given no c/f lows mid-day and need for the PP coverage. Close pharm follow up ~2 weeks to assess insulin  dose prior to Ozempic  increase.   Future Consideration: Metformin : Avoid in setting of CKD TZD: Avoiding in the setting of CHF Dx Insulin : Continue to maximize non-insulin  therapies w goal of reducing insulin  dose given risk hypoglycemia. Follow Up  PCP follow up scheduled ~3 month PharmD follow up by phone 2 week to review basal insulin  needs in setting of Ozempic  titration Patient given direct line for questions regarding medication therapy  Future Appointments  Date Time Provider Department Center   08/12/2023  9:30 AM [facility] PHARMACIST [facility]  08/17/2023 10:30 AM [facility] None  08/18/2023 10:30 AM Timmy Forbes, MD [facility] None  08/18/2023 10:45 AM [facility]IR 1 [facility] None  10/01/2023  9:30 AM Helaine Llanos, MD [facility]   Daron Ellen, PharmD Clinical Pharmacist [facility] [phone]

## 2023-08-07 DIAGNOSIS — H2000 Unspecified acute and subacute iridocyclitis: Secondary | ICD-10-CM | POA: Diagnosis not present

## 2023-08-07 DIAGNOSIS — H40043 Steroid responder, bilateral: Secondary | ICD-10-CM | POA: Diagnosis not present

## 2023-08-07 DIAGNOSIS — H40053 Ocular hypertension, bilateral: Secondary | ICD-10-CM | POA: Diagnosis not present

## 2023-08-08 ENCOUNTER — Other Ambulatory Visit: Payer: Self-pay | Admitting: Internal Medicine

## 2023-08-11 NOTE — Progress Notes (Signed)
   08/12/2023 Name: Adam [name] MRN: [id number] DOB: 01/22/1951  Subjective  Chief Complaint  Patient presents with   Diabetes   Reason for visit: ?  Brief check in for medication titration   Care Team: Primary Care Provider: Helaine Llanos, MD Cardiology - Wenona Hamilton, MD   Nephrology  Since Last visit / History of Present Illness: ?  Since last visit, patient reports doing well. Used last of his Ozempic  1.0 mg, plans to increase this upcoming weekend. Confirms resumption of insulin  at previous dose, 50 units.    Reported DM Regimen: ?  Lantus  (glargine) 50 units daily (night) Ozempic  1.0 mg weekly (last dose used 6/15) Jardiance  25 mg daily Glipizide  XL 5 mg - twice daily   SMBG Libre 3 plus (Reader):  Average glucose Last 7 Days:  165 mg/dL Average glucose by time of day:     12 am - 6 am: 168       6 am - 12 pm: 136      12 pm - 6 pm: 159      6 pm - 12 pm:  199  Hypo/Hyperglycemia: ?  Symptoms of hypoglycemia since last visit:?No Symptoms of hyperglycemia since last visit:? no   Assessment and Plan:   1. Diabetes, type 2: Due for Ozempic  titration to 2.0 mg (has supply at home from PAP). Called today to ensure no c/f lows prior to dose increase. CGM report looks good. FBG remain slightly above goal on average, though significantly improved from previous visit w Avg sugars 200s-300s. No instances of BG <70 mg/dL on CGM report. Patient denies CGM alarms. ABG of 165 mg/dL would correspond to estimated A1c ~7.4%. Increase Ozmepic to 2.0 mg (6/22) Continue Lantus  50 units once daily No change to glipizide  for now given no c/f lows mid-day and need for the PP coverage however, ideally will be able to discontinue or at least reduce frequency with Ozempic  titration if PP sugars begin trending lower Close pharm follow up ~2 weeks to assess insulin /glipizide  s/p Ozempic  increase. If any low alarms, patient aware to notify. Has PharmD direct line.   Future  Consideration: N/A Metformin : Avoid in setting of CKD TZD: Avoiding in the setting of CHF Dx Insulin : Continue to maximize non-insulin  therapies w goal of reducing insulin  dose given risk hypoglycemia.   Follow Up  PCP follow up scheduled ~2 month Brief PharmD follow up by phone 2 weeks Patient given direct line for questions regarding medication therapy  Future Appointments  Date Time Provider Department Center  08/17/2023 10:30 AM [facility] None  08/18/2023 10:30 AM Timmy Forbes, MD [facility] None  08/18/2023 10:45 AM [facility]IR 1 [facility] None  08/26/2023  9:00 AM [facility] PHARMACIST [facility]  10/01/2023  9:30 AM Helaine Llanos, MD [facility]   Daron Ellen, PharmD Clinical Pharmacist [facility] [phone]

## 2023-08-12 ENCOUNTER — Other Ambulatory Visit: Admitting: Pharmacist

## 2023-08-12 DIAGNOSIS — E1142 Type 2 diabetes mellitus with diabetic polyneuropathy: Secondary | ICD-10-CM

## 2023-08-14 ENCOUNTER — Encounter: Payer: Self-pay | Admitting: Internal Medicine

## 2023-08-14 ENCOUNTER — Other Ambulatory Visit: Payer: Self-pay | Admitting: Pharmacist

## 2023-08-14 ENCOUNTER — Ambulatory Visit: Admitting: Internal Medicine

## 2023-08-14 VITALS — BP 138/88 | HR 81 | Temp 98.5°F | Ht 71.5 in

## 2023-08-14 DIAGNOSIS — E1142 Type 2 diabetes mellitus with diabetic polyneuropathy: Secondary | ICD-10-CM

## 2023-08-14 DIAGNOSIS — M25562 Pain in left knee: Secondary | ICD-10-CM | POA: Diagnosis not present

## 2023-08-14 MED ORDER — ALLOPURINOL 100 MG PO TABS: 200.0000 mg | ORAL_TABLET | Freq: Every day | ORAL | 3 refills | Status: DC

## 2023-08-14 MED ORDER — COLCHICINE 0.6 MG PO TABS: 0.6000 mg | ORAL_TABLET | Freq: Two times a day (BID) | ORAL | 2 refills | Status: AC | PRN

## 2023-08-14 MED ORDER — PREDNISONE 20 MG PO TABS: 40.0000 mg | ORAL_TABLET | Freq: Every day | ORAL | 0 refills | Status: DC

## 2023-08-14 NOTE — Patient Instructions (Signed)
 Please continue the colchicine  twice a day (can take them both in the morning) Start prednisone , 2 tabs daily for 5 days, then 1 tab daily for 5 days If your pain is not better by Monday, set up an urgent visit at [facility]  Once the pain is improved, increase the allopurinol  to 200mg  (2 tabs) daily
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
## 2023-08-14 NOTE — Assessment & Plan Note (Addendum)
 Is likely the gout Will try prednisone  40mg  daily x 5, 20mg  daily x 5 Continue colchicine  twice a day If not better by Monday, 6/13--go to [facility] for same day visit  Last uric acid 7.6----when pain is gone, will increase allopurionol to 200mg  daily

## 2023-08-14 NOTE — Progress Notes (Signed)
 Brief Telephone Documentation Reason for Call: Received call from patient's wife regarding recent PAP shipment  Summary of Call: [name] states she picked up Mr Adam [name] while in clinic today. Notes they still have quite a bit of Basaglar  left from [employer].   For simplicity, we agreed on the following: Continue Basaglar  (Expiration date = Jan 2026).  Place Horace Lye in different location within the fridge as to not get confused (expiration date 2027).  [employer] contacted to ensure no future basaglar  refills   2.   Prescribed oral steroid for gout flare today. Concerned for hyperglycemia. Also note FBG this morning was 69 mg/dL, none lower recently.  Discussed possibly of holding evening glipizide  dose once Ozempic  is increased, though will Continue current regimen without changes for now in anticipation of steroid-induced hyperglycemia. Will touch base on Monday as to review effect of first couple days of prednisone    Follow Up: Patient given direct line for further questions/concerns.  Daron Ellen, PharmD Clinical Pharmacist [facility] [phone]

## 2023-08-17 ENCOUNTER — Inpatient Hospital Stay: Payer: Medicare PPO | Attending: Oncology

## 2023-08-17 DIAGNOSIS — Z5986 Financial insecurity: Secondary | ICD-10-CM | POA: Diagnosis not present

## 2023-08-17 DIAGNOSIS — Z87891 Personal history of nicotine dependence: Secondary | ICD-10-CM | POA: Diagnosis not present

## 2023-08-17 DIAGNOSIS — Z86718 Personal history of other venous thrombosis and embolism: Secondary | ICD-10-CM | POA: Diagnosis not present

## 2023-08-17 DIAGNOSIS — Z881 Allergy status to other antibiotic agents status: Secondary | ICD-10-CM | POA: Insufficient documentation

## 2023-08-17 DIAGNOSIS — Z833 Family history of diabetes mellitus: Secondary | ICD-10-CM | POA: Insufficient documentation

## 2023-08-17 DIAGNOSIS — M109 Gout, unspecified: Secondary | ICD-10-CM | POA: Insufficient documentation

## 2023-08-17 DIAGNOSIS — Z8379 Family history of other diseases of the digestive system: Secondary | ICD-10-CM | POA: Diagnosis not present

## 2023-08-17 DIAGNOSIS — I129 Hypertensive chronic kidney disease with stage 1 through stage 4 chronic kidney disease, or unspecified chronic kidney disease: Secondary | ICD-10-CM | POA: Insufficient documentation

## 2023-08-17 DIAGNOSIS — Z860100 Personal history of colon polyps, unspecified: Secondary | ICD-10-CM | POA: Diagnosis not present

## 2023-08-17 DIAGNOSIS — K862 Cyst of pancreas: Secondary | ICD-10-CM | POA: Insufficient documentation

## 2023-08-17 DIAGNOSIS — Z7901 Long term (current) use of anticoagulants: Secondary | ICD-10-CM | POA: Insufficient documentation

## 2023-08-17 DIAGNOSIS — C641 Malignant neoplasm of right kidney, except renal pelvis: Secondary | ICD-10-CM | POA: Insufficient documentation

## 2023-08-17 DIAGNOSIS — E1122 Type 2 diabetes mellitus with diabetic chronic kidney disease: Secondary | ICD-10-CM | POA: Diagnosis not present

## 2023-08-17 DIAGNOSIS — D631 Anemia in chronic kidney disease: Secondary | ICD-10-CM

## 2023-08-17 DIAGNOSIS — M7989 Other specified soft tissue disorders: Secondary | ICD-10-CM | POA: Diagnosis not present

## 2023-08-17 DIAGNOSIS — Z79899 Other long term (current) drug therapy: Secondary | ICD-10-CM | POA: Diagnosis not present

## 2023-08-17 DIAGNOSIS — Z8249 Family history of ischemic heart disease and other diseases of the circulatory system: Secondary | ICD-10-CM | POA: Diagnosis not present

## 2023-08-17 DIAGNOSIS — K2289 Other specified disease of esophagus: Secondary | ICD-10-CM | POA: Insufficient documentation

## 2023-08-17 DIAGNOSIS — N1832 Chronic kidney disease, stage 3b: Secondary | ICD-10-CM | POA: Insufficient documentation

## 2023-08-17 DIAGNOSIS — Z886 Allergy status to analgesic agent status: Secondary | ICD-10-CM | POA: Diagnosis not present

## 2023-08-17 LAB — CBC WITH DIFFERENTIAL (CANCER CENTER ONLY)
Abs Immature Granulocytes: 0.1 10*3/uL — ABNORMAL HIGH (ref 0.00–0.07)
Basophils Absolute: 0 10*3/uL (ref 0.0–0.1)
Basophils Relative: 0 %
Eosinophils Absolute: 0 10*3/uL (ref 0.0–0.5)
Eosinophils Relative: 0 %
HCT: 34.7 % — ABNORMAL LOW (ref 39.0–52.0)
Hemoglobin: 11.1 g/dL — ABNORMAL LOW (ref 13.0–17.0)
Immature Granulocytes: 1 %
Lymphocytes Relative: 29 %
Lymphs Abs: 3 10*3/uL (ref 0.7–4.0)
MCH: 28.4 pg (ref 26.0–34.0)
MCHC: 32 g/dL (ref 30.0–36.0)
MCV: 88.7 fL (ref 80.0–100.0)
Monocytes Absolute: 0.7 10*3/uL (ref 0.1–1.0)
Monocytes Relative: 7 %
Neutro Abs: 6.5 10*3/uL (ref 1.7–7.7)
Neutrophils Relative %: 63 %
Platelet Count: 240 10*3/uL (ref 150–400)
RBC: 3.91 MIL/uL — ABNORMAL LOW (ref 4.22–5.81)
RDW: 13.9 % (ref 11.5–15.5)
WBC Count: 10.3 10*3/uL (ref 4.0–10.5)
nRBC: 0 % (ref 0.0–0.2)

## 2023-08-17 LAB — FERRITIN: Ferritin: 114 ng/mL (ref 24–336)

## 2023-08-17 LAB — IRON AND TIBC
Iron: 97 ug/dL (ref 45–182)
Saturation Ratios: 53 % — ABNORMAL HIGH (ref 17.9–39.5)
TIBC: 182 ug/dL — ABNORMAL LOW (ref 250–450)
UIBC: 85 ug/dL

## 2023-08-17 LAB — RETIC PANEL
Immature Retic Fract: 8.7 % (ref 2.3–15.9)
RBC.: 3.87 MIL/uL — ABNORMAL LOW (ref 4.22–5.81)
Retic Count, Absolute: 43.3 10*3/uL (ref 19.0–186.0)
Retic Ct Pct: 1.1 % (ref 0.4–3.1)
Reticulocyte Hemoglobin: 30.9 pg (ref >27.9)

## 2023-08-18 ENCOUNTER — Encounter: Payer: Self-pay | Admitting: Oncology

## 2023-08-18 ENCOUNTER — Inpatient Hospital Stay (HOSPITAL_BASED_OUTPATIENT_CLINIC_OR_DEPARTMENT_OTHER): Payer: Medicare PPO | Admitting: Oncology

## 2023-08-18 ENCOUNTER — Inpatient Hospital Stay: Payer: Medicare PPO

## 2023-08-18 VITALS — BP 147/98 | HR 80 | Temp 96.9°F | Resp 18 | Wt 243.3 lb

## 2023-08-18 DIAGNOSIS — K2289 Other specified disease of esophagus: Secondary | ICD-10-CM | POA: Diagnosis not present

## 2023-08-18 DIAGNOSIS — E1122 Type 2 diabetes mellitus with diabetic chronic kidney disease: Secondary | ICD-10-CM | POA: Diagnosis not present

## 2023-08-18 DIAGNOSIS — M109 Gout, unspecified: Secondary | ICD-10-CM | POA: Diagnosis not present

## 2023-08-18 DIAGNOSIS — C641 Malignant neoplasm of right kidney, except renal pelvis: Secondary | ICD-10-CM | POA: Diagnosis not present

## 2023-08-18 DIAGNOSIS — K862 Cyst of pancreas: Secondary | ICD-10-CM

## 2023-08-18 DIAGNOSIS — N1832 Chronic kidney disease, stage 3b: Secondary | ICD-10-CM

## 2023-08-18 DIAGNOSIS — D631 Anemia in chronic kidney disease: Secondary | ICD-10-CM | POA: Diagnosis not present

## 2023-08-18 DIAGNOSIS — M7989 Other specified soft tissue disorders: Secondary | ICD-10-CM | POA: Diagnosis not present

## 2023-08-18 DIAGNOSIS — I82401 Acute embolism and thrombosis of unspecified deep veins of right lower extremity: Secondary | ICD-10-CM | POA: Diagnosis not present

## 2023-08-18 DIAGNOSIS — Z86718 Personal history of other venous thrombosis and embolism: Secondary | ICD-10-CM | POA: Diagnosis not present

## 2023-08-18 DIAGNOSIS — I129 Hypertensive chronic kidney disease with stage 1 through stage 4 chronic kidney disease, or unspecified chronic kidney disease: Secondary | ICD-10-CM | POA: Diagnosis not present

## 2023-08-18 NOTE — Assessment & Plan Note (Addendum)
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  Continue annual surveillance.- March 2026

## 2023-08-18 NOTE — Assessment & Plan Note (Addendum)
 CA 19.9 normal. 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  S/p  EUS evaluation.  Stable on MRI.

## 2023-08-18 NOTE — Assessment & Plan Note (Addendum)
 Lab Results  Component Value Date   HGB 11.1 (L) 08/17/2023   TIBC 182 (L) 08/17/2023   IRONPCTSAT 53 (H) 08/17/2023   FERRITIN 114 08/17/2023    Hemoglobin has improved Continue 325 mg BID
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
## 2023-08-18 NOTE — Assessment & Plan Note (Signed)
Stable Cr.  Avoid nephrotoxins and encourage oral hydration.  

## 2023-08-18 NOTE — Assessment & Plan Note (Signed)
#  unprovoked acute lower extremity DVT.  Negative prothrombin gene and factor V Leiden mutation, negative APS antibodies..Repeat left lower extremity DVT.  Continue Eliquis 2.5mg  BID for long term prophylaxis.   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

## 2023-08-19 ENCOUNTER — Telehealth: Payer: Self-pay

## 2023-08-19 NOTE — Telephone Encounter (Signed)
 Spoke to pt's wife per DPR that we received his Ozempic  4 boxes from [facility]. They are in the 2nd fridge in the med area.

## 2023-08-26 ENCOUNTER — Other Ambulatory Visit

## 2023-08-26 NOTE — Progress Notes (Deleted)
   08/26/2023 Name: Adam [name] MRN: [id number] DOB: January 26, 1951  Subjective  No chief complaint on file.  Reason for visit: ?  Medication titration and monitoring  Care Team: Primary Care Provider: [name], MD Cardiology - [name], MD   Nephrology  Since Last visit / History of Present Illness: ?  Since last visit, patient reports doing well. Increased Ozempic  dose to 2.0 mg 6/22. Second dose given this past Sunday. No concerns or adverse effects at this time.  ***   Reported DM Regimen: ?  Lantus (glargine) 50 units daily (night) Ozempic 2.0 mg weekly (last dose used 6/15) Jardiance 25 mg daily Glipizide XL 5 mg - twice daily   SMBG Libre 3 plus (Reader):  Average glucose Last 7 Days:  165 mg/dL Average glucose by time of day:     12 am - 6 am: 168       6 am - 12 pm: 136      12  pm - 6 pm: 159      6 pm - 12 pm:  199  Hypo/Hyperglycemia: ?  Symptoms of hypoglycemia since last visit:?No Symptoms of hyperglycemia since last visit:? no   Assessment and Plan:   1. Diabetes, type 2: Due for Ozempic  titration to 2.0 mg (has supply at home from PAP). Called today to ensure no c/f lows prior to dose increase. CGM report looks good. FBG remain slightly above goal on average, though significantly improved from previous visit w Avg sugars 200s-300s. No instances of BG <70 mg/dL on CGM report. Patient denies CGM alarms. ABG of 165 mg/dL would correspond to estimated A1c ~7.4%. Current Regimen: Ozempic  2.0 mg/wk, glargine 50u/d, Jardiance  25mg /d, glipizide  XL 5mg  BID No change to glipizide  for now given no c/f lows mid-day and need for the PP coverage however, ideally will be able to discontinue or at least reduce frequency with Ozempic  titration if PP sugars begin trending lower Close pharm follow up ~2 weeks to assess insulin /glipizide  s/p Ozempic  increase. If any low alarms, patient aware to notify. Has PharmD direct line.   Future Consideration:  N/A Metformin : Avoid in setting of CKD TZD: Avoiding in the setting of CHF Dx Insulin : Continue to maximize non-insulin  therapies w goal of reducing insulin  dose given risk hypoglycemia.   Follow Up  PCP follow up scheduled ~2 month Brief PharmD follow up by phone 2 weeks Patient given direct line for questions regarding medication therapy  Future Appointments  Date Time Provider Department Center  08/26/2023  9:00 AM [facility] PHARMACIST [facility]  10/01/2023  9:30 AM [name], MD [facility]  02/23/2024  9:45 AM [facility] None  03/01/2024 10:15 AM [name], MD [facility] None   Manuelita FABIENE Kobs, PharmD Clinical Pharmacist [facility] [phone]

## 2023-08-27 ENCOUNTER — Other Ambulatory Visit (INDEPENDENT_AMBULATORY_CARE_PROVIDER_SITE_OTHER): Admitting: Pharmacist

## 2023-08-27 DIAGNOSIS — E119 Type 2 diabetes mellitus without complications: Secondary | ICD-10-CM

## 2023-08-31 NOTE — Progress Notes (Signed)
   08/31/2023 Name: Adam [name] MRN: [id number] DOB: April 19, 1950  Subjective  Chief Complaint  Patient presents with   Diabetes   Reason for visit: ?  Medication titration and monitoring  Care Team: Primary Care Provider: [name], MD Cardiology - [name], MD   Nephrology  Since Last visit / History of Present Illness: ?  Since last visit, patient reports doing well. Increased Ozempic  dose to 2.0 mg 6/22. Second dose given this past Sunday. No concerns or adverse effects at this time.   Notes hyperglycemia related to recent course of oral steroids, now completed with sugars returning to usual range.    Reported DM Regimen: ?  Lantus  (glargine) 50 units daily (night) Ozempic  2.0 mg weekly (increased 6/12) Jardiance  25 mg daily Glipizide  XL 5 mg - twice daily   SMBG Libre 3 plus (Reader): **oral steroid course** Notes FBG during steroid treatment high 100s-200s. Since completion of steroid course, notes FBG slowly returning no baseline levels. Most recently, BG readings today/yesterday 90-110s mg/dL. No c/f hypoglycemia.  0 low glucose events.   Hypo/Hyperglycemia: ?  Symptoms of hypoglycemia since last visit:?No Symptoms of hyperglycemia since last visit:? no   Assessment and Plan:   1. Diabetes, type 2: No c/f lows s/p Ozempic  titration though confounded by recent oral steroid course resulting in consistent hyperglcyemia. Today, sugars back to baseline with FBG as low as upper 80s mg/dL. No instances of BG <70 mg/dL on CGM report. Patient denies CGM alarms. Plan to continue current regimen with close monitoring next couple of weeks on higher Ozempic  dose.  Current Regimen: Ozempic  2.0 mg/wk, glargine 50u/d, Jardiance  25mg /d, glipizide  XL 5mg  BID No change to glipizide  for now given no c/f lows mid-day and need for the PP coverage however, ideally will be able to discontinue or at least reduce frequency with Ozempic  titration if PP sugars begin trending  lower  Future Consideration: N/A Metformin : Avoid in setting of CKD TZD: Avoiding in the setting of CHF Dx Insulin : Continue to maximize non-insulin  therapies w goal of reducing insulin  dose given risk hypoglycemia.   Follow Up  PCP follow up scheduled ~1 month Brief PharmD follow up by phone ~2 weeks. Patient given direct line for questions regarding medication therapy or if any c/f low glucose alarms on CGM.   Future Appointments  Date Time Provider Department Center  10/01/2023  9:30 AM [name], MD [facility]  02/23/2024  9:45 AM [facility] None  03/01/2024 10:15 AM [name], MD [facility] None   Manuelita FABIENE Kobs, PharmD Clinical Pharmacist [facility] [phone]

## 2023-09-25 ENCOUNTER — Other Ambulatory Visit: Payer: Self-pay | Admitting: Interventional Radiology

## 2023-09-25 DIAGNOSIS — C641 Malignant neoplasm of right kidney, except renal pelvis: Secondary | ICD-10-CM

## 2023-09-28 ENCOUNTER — Ambulatory Visit: Payer: Medicare PPO | Admitting: Internal Medicine

## 2023-10-01 ENCOUNTER — Encounter: Payer: Self-pay | Admitting: Internal Medicine

## 2023-10-01 ENCOUNTER — Ambulatory Visit: Admitting: Internal Medicine

## 2023-10-01 VITALS — BP 110/68 | HR 85 | Temp 98.7°F | Ht 71.5 in | Wt 246.0 lb

## 2023-10-01 DIAGNOSIS — E1142 Type 2 diabetes mellitus with diabetic polyneuropathy: Secondary | ICD-10-CM | POA: Diagnosis not present

## 2023-10-01 DIAGNOSIS — Z7985 Long-term (current) use of injectable non-insulin antidiabetic drugs: Secondary | ICD-10-CM

## 2023-10-01 DIAGNOSIS — Z7984 Long term (current) use of oral hypoglycemic drugs: Secondary | ICD-10-CM

## 2023-10-01 LAB — POCT GLYCOSYLATED HEMOGLOBIN (HGB A1C): Hemoglobin A1C: 10.1 % — AB (ref 4.0–5.6)

## 2023-10-01 MED ORDER — GLIPIZIDE ER 5 MG PO TB24: 10.0000 mg | ORAL_TABLET | Freq: Every day | ORAL | 3 refills | Status: DC

## 2023-10-01 MED ORDER — TRIAMCINOLONE ACETONIDE 0.1 % EX CREA: 1.0000 | TOPICAL_CREAM | Freq: Two times a day (BID) | CUTANEOUS | 0 refills | Status: AC
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
## 2023-10-01 NOTE — Assessment & Plan Note (Signed)
 Lab Results  Component Value Date   HGBA1C 10.1 (A) 10/01/2023   Slightly better Only on the 2mg  ozempic  for 2-3 weeks--hopefully will improve more Will change the 10mg  glipizide  to all in the morning--since he doesn't eat much later in the day Continue jardiance  25 and glargine 50 daily

## 2023-10-01 NOTE — Patient Instructions (Signed)
 Please change the glipizide  to 2 tabs (10mg ) with breakfast

## 2023-10-06 DIAGNOSIS — R809 Proteinuria, unspecified: Secondary | ICD-10-CM | POA: Diagnosis not present

## 2023-10-06 DIAGNOSIS — I82402 Acute embolism and thrombosis of unspecified deep veins of left lower extremity: Secondary | ICD-10-CM | POA: Diagnosis not present

## 2023-10-06 DIAGNOSIS — N2581 Secondary hyperparathyroidism of renal origin: Secondary | ICD-10-CM | POA: Diagnosis not present

## 2023-10-06 DIAGNOSIS — N1832 Chronic kidney disease, stage 3b: Secondary | ICD-10-CM | POA: Diagnosis not present

## 2023-10-06 DIAGNOSIS — D631 Anemia in chronic kidney disease: Secondary | ICD-10-CM | POA: Diagnosis not present

## 2023-10-06 DIAGNOSIS — N281 Cyst of kidney, acquired: Secondary | ICD-10-CM | POA: Diagnosis not present

## 2023-10-06 DIAGNOSIS — I10 Essential (primary) hypertension: Secondary | ICD-10-CM | POA: Diagnosis not present

## 2023-10-06 DIAGNOSIS — I509 Heart failure, unspecified: Secondary | ICD-10-CM | POA: Diagnosis not present

## 2023-10-06 DIAGNOSIS — E1122 Type 2 diabetes mellitus with diabetic chronic kidney disease: Secondary | ICD-10-CM | POA: Diagnosis not present

## 2023-10-06 NOTE — Progress Notes (Signed)
 Follow Up Visit   Patient Name: Adam [name], male   Patient DOB: [birth year] Date of Service: 10/06/2023  Patient MRN: [id number] Provider Creating Note: Pinkey Edman, MD  [phone] Primary Care Physician:   [address] [place] [address] Additional Physicians/ Providers:    History of Present Illness Adam [name] is a 73 y.o. male now comes to the office for follow-up.  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   Patient had cryoablation of the right renal mass in the month of March of 2022. He had a follow up MRI which did not show any evidence of recurrence.  He is now restarted back on ACE inhibitor's with lisinopril  20 mg daily.     He also went to the hospital with leg swelling and was found to have left lower extremity DVT and is now on Eliquis .   He is now on insulin  and also semaglutide , glipizide  along with Empagliflozin .    He has been on 40 mg Lasix  every  day.  He has been drinking plenty of water. He did not have any acute gout attacks in the recent past.   He denies any chest pain, shortness of breath or orthopnea. He denies any use of nonsteroidal anti-inflammatory drugs. He has been on Jardiance  and has been tolerating well.  Medications   Current Outpatient Medications:  .  allopurinol  (ZYLOPRIM ) 100 MG tablet, Take 200 mg by mouth 1 (one) time each day, Disp: , Rfl:  .  insulin  glargine (Basaglar  KwikPen) 100 UNIT/ML injection, Inject 50 Units under the skin every night, Disp: , Rfl:  .  lisinopril  20 MG tablet, Take 20 mg by mouth 1 (one) time each day, Disp: , Rfl:  .  omeprazole  (PriLOSEC) 20 MG DR capsule, Take 20 mg by mouth 1 (one) time each day, Disp: , Rfl:  .  SEMAGLUTIDE , 2 MG/DOSE, [place], Inject 2 mg  under the skin 1 (one) time per week, Disp: , Rfl:  .  acetaminophen  (TYLENOL ) 500 MG tablet, Take by mouth every 6 (six) hours if needed for mild pain, Disp: , Rfl:  .  atorvastatin  (LIPITOR ) 80 MG tablet, Take 80 mg by mouth at bed time, Disp: , Rfl:  .  brimonidine -timolol  (COMBIGAN ) 0.2-0.5 % ophthalmic solution, 1 drop, Disp: , Rfl:  .  calcitriol  (Rocaltrol ) 0.25 MCG capsule, Take 1 capsule (0.25 mcg total) by mouth 1 (one) time each day, Disp: 90 capsule, Rfl: 3 .  cholecalciferol (VITAMIN D -3 SUPER STRENGTH) 50 MCG (2000 UT) tablet, Take 2,000 Units by mouth 1 (one) time each day, Disp: , Rfl:  .  Eliquis  5 MG tablet, Take 2.5 mg by mouth in the morning and 2.5 mg in the evening., Disp: , Rfl:  .  Empagliflozin  25 MG tablet, Take 25 mg by mouth 1 (one) time each day, Disp: , Rfl:  .  ferrous sulfate  325 (65 Fe) MG EC tablet, Take 325 mg by mouth, Disp: , Rfl:  .  fluticasone  (FLONASE ) 50 MCG/ACT nasal spray, if needed, Disp: , Rfl:  .  furosemide  (Lasix ) 20 MG tablet, Take 1 tablet (20 mg total) by mouth in the morning and 1 tablet (20 mg total) in the evening., Disp: 180 tablet, Rfl: 3 .  glipiZIDE  (GLUCOTROL ) 10 MG tablet, Take 10 mg  by mouth 1 (one) time each day, Disp: , Rfl:  .  triamcinolone  (KENALOG ) 0.1 % cream, 2 (two) times a day APPLY TOPICALLY TO THE AFFECTED AREA TWICE DAILY AS NEEDED, Disp: , Rfl:    Allergies Cephalexin  and Ibuprofen  Problem List Patient Active Problem List  Diagnosis  . Hypertension  . Hyperparathyroidism due to renal insufficiency (HCC)  . Stage 3b chronic kidney disease (HCC)  . Anemia in chronic kidney disease  . Proteinuria, not otherwise specified  . Congestive heart failure, not otherwise specified (HCC)  . Type 2 diabetes mellitus with diabetic chronic kidney disease (HCC)  . Aquired multiple cysts of kidney  . Deep venous thrombosis <Left side> (HCC)     Review of Systems  Constitutional: Negative.   HENT: Negative.    Eyes:  Negative.   Respiratory: Negative.    Cardiovascular: Negative.   Gastrointestinal: Negative.   Genitourinary: Negative.   Musculoskeletal: Negative.   Skin: Negative.      History Past Medical History:  Diagnosis Date  . Abscess of scrotum   . Arthritis   . Benign essential hypertension   . Benign prostatic hyperplasia   . Cataract   . Chronic kidney disease   . Chronic venous insufficiency   . Diabetes mellitus without mention of complication, type II or unspecified type, not stated as uncontrolled (HCC)   . Diverticulosis of colon   . Esophageal reflux   . Hypoglycemia   . Lateral epicondylitis   . Memory loss   . Mixed hyperlipidemia due to type 2 diabetes mellitus (HCC)   . Osteoarthritis of right knee joint   . Paraparesis (HCC)   . Personal history of colon polyp   . Phimosis   . Renal mass    right  . Secondary hyperparathyroidism of renal origin (HCC)   . Severe obesity (HCC)   . Sleep apnea   . Tremor   . Type 2 diabetes mellitus with severe nonproliferative diabetic retinopathy without macular edema of bilateral eyes, not otherwise specified (HCC)   . Uncontrolled type 2 diabetes mellitus with neurological manifestations [facility])     Past Surgical History:  Procedure Laterality Date  . ARTHROSCOPY SHOULDER W/ OPEN ROTATOR CUFF REPAIR    . CATARACT EXTRACTION, BILATERAL    . RENAL MASS EXCISION Right 05/23/2020   Cryoblation   Family History  Problem Relation Age of Onset  . Diabetes Mother   . Hypertension Mother   . Diabetes Father   . Diabetes Brother   . Hypertension Brother   . Cancer Brother    Social History   Tobacco Use  . Smoking status: Former    Current packs/day: 0.00    Types: Cigarettes    Quit date: 2000    Years since quitting: 25.6  . Smokeless tobacco: Never  Substance Use Topics  . Alcohol  use: Never        Physical Exam  Vitals BP 138/86 (BP Location: Left upper arm, Patient Position: Sitting)   Pulse 73   Temp  98.3 F   Wt 250 lb (113 kg)   SpO2 96%   BMI 34.38 kg/m   PHYSICAL EXAM: General appearance: well developed, well nourished, NAD Neck: Trachea midline; supple Lungs: CTAB, with normal respiratory effort  CV: S1S2, no murmurs or rubs. Abdomen: Soft, non-tender; bowel sounds present Extremities: No peripheral edema   Laboratory Studies   Labs  Lab Units 12/03/22 1113 09/11/22 1038 01/08/22 0921  SODIUM mmol/L 140 137 139  POTASSIUM  mmol/L 3.8 4.0 3.7  CHLORIDE mmol/L 105 102 107  CO2 mmol/L 28 27 26   BUN mg/dL 24 25 23   CREATININE mg/dL 7.81* 7.71* 8.39*  EGFR mL/min/1.68m2 31* 30* 45*  CALCIUM  mg/dL 9.3 9.4 9.0  PHOSPHORUS mg/dL 3.8 4.7* 3.4  ALBUMIN g/dL 3.3* 3.6 3.4*    CBC  Lab Units 12/03/22 1113 09/11/22 1038 01/08/22 0921  WBC AUTO Thousand/uL 5.6 6.6 6.2  HEMOGLOBIN g/dL 87.9* 87.9* 89.1*  HEMOGLOBIN URINE  TRACE* TRACE* 2+*  HEMATOCRIT % 38.7 38.3* 34.2*  MCV fL 91.3 89.9 85.7  PLATELETS AUTO Thousand/uL 198 187 212    Urine  Lab Units 12/03/22 1113 09/11/22 1038 01/08/22 0921  COLOR U  YELLOW YELLOW YELLOW  KETONES U MG/DL  NEGATIVE NEGATIVE NEGATIVE  PROT/CREAT RATIO UR mg/g creat 3.554*  3,554* 3.250*  3,250* 2.690*  2,690*    Imaging and Other Studies  Right renal cell carcinoma status post cryoablation   EXAM:  MRI ABDOMEN WITHOUT AND WITH CONTRAST (INCLUDING MRCP)   TECHNIQUE:  Multiplanar multisequence MR imaging of the abdomen was performed  both before and after the administration of intravenous contrast.  Heavily T2-weighted images of the biliary and pancreatic ducts were  obtained, and three-dimensional MRCP images were rendered by post  processing.   CONTRAST: 10mL GADAVIST  GADOBUTROL  1 MMOL/ML IV SOLN   COMPARISON:  08/07/2022   FINDINGS:  Lower chest: No acute abnormality.   Hepatobiliary: No solid liver abnormality is seen. No gallstones,  gallbladder wall thickening, or biliary dilatation.   Pancreas:  Unchanged fluid signal cystic lesion of the pancreatic  neck, measuring 1.7 by 1.4 cm. Continued increase in diffuse  dilatation of the pancreatic duct distal to this lesion as well as  atrophy of the pancreatic parenchyma, concerning for main duct  involvement (series 4, image 21, 19).   Spleen: Normal in size without significant abnormality.   Adrenals/Urinary Tract: Adrenal glands are unremarkable. Unchanged  ablation site of the peripheral superior pole of the right kidney,  with associated fat necrosis but without residual contrast  enhancement, measuring 3.1 x 1.9 cm (series 4, image 14). Multiple  simple, benign bilateral renal cortical cysts. No calculi or  hydronephrosis.   Stomach/Bowel: Stomach is within normal limits. No evidence of bowel  wall thickening, distention, or inflammatory changes. Pancolonic  diverticulosis.   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 No enlarged abdominal lymph nodes.   Other: No abdominal wall hernia or abnormality. No ascites.   Musculoskeletal: No acute or significant osseous findings.   IMPRESSION:  1. Unchanged ablation site of the peripheral superior pole of the  right kidney, with associated fat necrosis but without residual  contrast enhancement, consistent with treated renal cell carcinoma.  2. No evidence of lymphadenopathy or metastatic disease in the  abdomen.  3. Unchanged fluid signal cystic lesion of the pancreatic neck,  measuring 1.7 by 1.4 cm. Continued increase in diffuse dilatation of  the pancreatic duct distal to this lesion as well as atrophy of the  pancreatic parenchyma, concerning for main duct involvement of a  cystic neoplasm. As previously reported, consider EUS/FNA.  4. 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  5.  Pancolonic diverticulosis.    Electronically Signed    By: Marolyn JONETTA Jaksch M.D.    On: 05/04/2023 23:27   Problem  List Items Addressed This Visit     Hypertension - Primary   Hyperparathyroidism due to renal insufficiency (HCC)   Stage 3b chronic kidney disease (HCC)   Anemia in chronic kidney disease (Chronic)   Proteinuria, not otherwise specified   Congestive heart failure, not otherwise specified (HCC)   Type 2 diabetes mellitus with diabetic chronic kidney disease (HCC) (Chronic)   Aquired multiple cysts of kidney   Deep venous thrombosis <Left side> (HCC)   Orders Placed This Encounter  . CBC and Differential  . Hemoglobin A1c  . PTH, Intact  . Renal Function Panel  . Uric Acid  . Urinalysis  . Protein, Total, Random Urine w/Creatinine (Protein/Creat Ratio)        Impression/Recommendations   Adam [name] is a 73 y.o. male with a past medical history of diabetes, obesity, peripheral vascular disease, hypertension, chronic kidney disease stage IIIb with anemia and proteinuria now comes for renal follow-up.   #1 CKD: CKD stage IIIb with anemia and proteinuria (G3/A3) is most likely due to chronic diabetic kidney disease associated with hypertensive heart disease.   #2:  Proteinuria: Proteinuria is most likely due to diabetic kidney disease.  He has been off of ACE inhibitors.  Will continue the Jardiance  for long-term cardiorenal protection.   #3:  Anemia: Anemia is possibly due to chronic kidney disease, patient is advised to take iron tablets.   #4:  Hypertension/congestive heart failure: He is presently on furosemide .  He is now has not felt at 20 mg daily along with furosemide  20 mg twice a day.      #5:  Obesity: Patient advised on the importance of dietary restriction and weight loss.  He is also advised to exercise.   #6:  History of right renal mass: Diagnosis of RCC and s/p cryoablation of the upper lobe of the right kidney.  Follow-up MRI was negative.  Advised to follow-up with urology on regular basis.   #7: Diabetes: Continue insulin  as prescribed.  Dietary advice given to the patient again.  Hemoglobin A1c is 9.8.  Continue the empagliflozin  for long-term cardiorenal protection.   #8: Left lower extremity DVT: He is presently on Eliquis .   #9: Secondary hyperparathyroidism: We will continue to monitor PTH, calcium  and phosphorus levels.  We will continue the vitamin D  and calcitriol  at the present doses for now.   Patient is advised to avoid nonsteroidal anti-inflammatory drugs. We will obtain blood work pertinent to renal disease. Spoke to the patient and his family at bedside in detail. CKD diet material distributed. Advised on importance of weight loss.   Dear Dr. Jimmy, Thank you for the opportunity to participate in the care of this very pleasant patient.   I discussed the assessment and treatment plan with the patient.  The patient was provided an opportunity to ask questions and all were answered.  The patient agreed with the plan and demonstrated an understanding of the instructions. The patient was advised to call back or seek an in-person evaluation if the symptoms worsen or if the condition fails to improve as anticipated.  Return in about 4 months (around 02/05/2024).  Disclaimer: Much of the narrative of this dictation was acquired using speech recognition software. It is possible that some dictated speech was not transcribed accurately by this system nor detected in the proofing. Such errors are at times unavoidable.    Pinkey Edman, MD [facility] Ph: [phone] Fax: [phone] 10/06/2023

## 2023-10-07 ENCOUNTER — Other Ambulatory Visit: Payer: Self-pay

## 2023-10-07 DIAGNOSIS — E1142 Type 2 diabetes mellitus with diabetic polyneuropathy: Secondary | ICD-10-CM

## 2023-10-07 NOTE — Progress Notes (Addendum)
 10/07/2023 Name: Adam [name] MRN: [id number] DOB: 1950/09/14  Subjective  Chief Complaint  Patient presents with   Diabetes   Reason for visit: ?  Adam [name] is a 73 y.o. male with a history of diabetes (type 2), who presents today for an initial diabetes pharmacotherapy visit.? Pertinent PMH also includes HTN w h/o syncope, CKD, HFpEF, obesity, hx gout, pancreatic cyst, hyperparathyroidism (renal origin), BPH.  Known DM Complications: retinopathy w macular edema, CKD, neuropathy  Care Team: Primary Care Provider: [name], MD Cardiology - [name], MD   Nephrology  Date of Last Diabetes Related Visit: with PCP on 01/05/24; 2 weeks (10/21/23) with pharmD Recent Summary of Change: 08/27/23: ??Lantus  to 50u/d 07/16/23: ??Ozempic  2mg /wk, ??Lantus  to 48u/d.  05/13/23: ??Lantus  45 ? 50 u/d Trulicity  ? Ozempic  per Lilly PAP denial 04/02/23: ??glipizide  XL 5 mg - twice daily 1/22: 10% reduction in basal insulin  50 > 45 units daily 2/2 frequent FBG <70 in the setting of reduced intake 09/25/22: +glipizide  XL  Medication Access/Adherence: Prescription drug coverage: Payor: HUMANA MEDICARE / Plan: HU[employer]NA MEDICARE CHOICE PPO / Product Type: *No Product type* /   Patient Assistance: Yes [name] (Basaglar ) - denied for Trulicity  d/t not meeting exception requirements  BI Cares (Jardiance ) NovoCares (Ozempic )  Since Last visit / History of Present Illness: ?   Reported DM Regimen: ?  Lantus  (glargine) 50 units daily (night) Ozempic  2 mg weekly Jardiance  25 mg daily Glipizide  XL 10 mg daily   DM medications tried in the past:?  Ozempic  (stopped due to backorder previously) Januvia  (stopped due to Ozempic  start)  SMBG Libre 3 plus (Reader):   Average glucose Last - 7 Days:  171 mg/dL Average glucose by time of day:     12 am - 6 am: 134       6 am - 12 pm: 147      12 pm - 6 pm: 179      6 pm - 12 pm: 231  Average hypoglycemia events - 7 day: 1  12 am  - 6 am: 1 event 6 am - 12 pm: 0 12 pm - 6 pm: 0  6 pm - 12 pm: 0  Hypo/Hyperglycemia: ?  Symptoms of hypoglycemia since last visit: Yes, had one this morning down to 69 mg/dL that was accompanied by shakiness and an alert from his [place] reader.  Re-corrected with cereal and 1/2 a cup of orange juice, which increased his sugar to 109 mg/dL. Symptoms of hyperglycemia since last visit:? No   Reported Diet: Patient typically eats 2 meals per day.  Breakfast (10 am - glipizide  at 9-9:30 am): 2 eggs, sausage, small portion of grits Lunch: Skips Dinner (5-6 pm): Baked chicken or fish, pinto/black beans, cabbage, carrots, broccoli.  Snacks: Occasionally (~1-2 days per week: ice cream cone or cookie) Beverages: Water (~64oz/day), occasionally diet soda, tea (sweet tea - 1/2 gallon lasts ~1week), lemonade, cranberry juice, orange juice (1-2 glasses/week).   Exercise: Ambulates well. Has not got around to starting gym but this is a goal once weather gets warmer. In summer is more active in the yard. Park in town has a track for walking.   DM Prevention:  Statin: Taking; high intensity.?  History of chronic kidney disease? yes History of albuminuria? yes, last UACR on 03/31/23 = 1919 mg/g (4562 in 2024) ACE/ARB - No, d/c'd by nephrology Urine MA/CR Ratio - elevated urinary albumin excretion.  Last eye exam: 09/18/2021; Retinopathy Present -  04/03/23 Last foot exam: 03/31/2023 Tobacco Use: Former smoker (quit >66yr ago) Immunizations:? Flu: Up to Date (last received 10/26/22) (Last: 10/26/2022); Pneumococcal: Up to Date PPSV23 (2018, 2003) PCV13 (2016); Shingrix: x1 in 2016, unclear if 2nd dose received; Covid (x2 2021, no Booster hx)  Cardiovascular Risk Reduction History of clinical ASCVD? no The 10-year ASCVD risk score (Arnett DK, et al., 2019) is: 42.4% History of heart failure? yes History of hyperlipidemia? yes Current BMI: 34.1 kg/m2 (Ht 5'11.5, Wt 112.7 kg) Taking statin? yes; high  intensity (atorvastatin  80mg ) Taking aspirin ? not indicated; Not taking   Taking SGLT-2i? yes Taking GLP- 1 RA? yes      _______________________________________________  Objective    Review of Systems:? Limited in the setting of virtual visit  Constitutional:? No fever, chills or unintentional weight loss  Cardiovascular:? No chest pain or pressure, shortness of breath, dyspnea on exertion, orthopnea or LE edema  GI:? No nausea, vomiting, constipation, diarrhea, abdominal pain, dyspepsia, change in bowel habits  Endocrine:? No polyuria, polyphagia or blurred vision    Physical Examination:  Vitals:  Wt Readings from Last 3 Encounters:  10/01/23 246 lb (111.6 kg)  08/18/23 243 lb 4.8 oz (110.4 kg)  07/01/23 250 lb (113.4 kg)   BP Readings from Last 3 Encounters:  10/01/23 110/68  08/18/23 (!) 147/98  08/14/23 138/88   Pulse Readings from Last 3 Encounters:  10/01/23 85  08/18/23 80  08/14/23 81    Labs:?  Lab Results  Component Value Date   HGBA1C 10.1 (A) 10/01/2023   HGBA1C 10.6 (A) 07/01/2023   HGBA1C 9.8 (H) 03/31/2023   GLUCOSE 134 (H) 04/13/2023   MICRALBCREAT 343.2 (H) 05/08/2009   MICRALBCREAT 48.1 (H) 05/01/2008   CREATININE 1.92 (H) 04/13/2023   CREATININE 2.05 (H) 03/31/2023   CREATININE 1.92 (H) 10/06/2022   GFR 31.39 (L) 03/31/2023   GFR 41.63 (L) 03/27/2022   GFR 36.13 (L) 09/13/2021    Lab Results  Component Value Date   CHOL 157 03/31/2023   LDLCALC 89 03/31/2023   LDLCALC 99 03/27/2022   LDLCALC 102 (H) 12/14/2020   LDLDIRECT 112.0 12/13/2019   LDLDIRECT 102.0 05/25/2017   HDL 35.10 (L) 03/31/2023   TRIG 163.0 (H) 03/31/2023   TRIG 97.0 03/27/2022   TRIG 140.0 12/14/2020   ALT 14 04/13/2023   ALT 15 03/31/2023   AST 17 04/13/2023   AST 17 03/31/2023      Chemistry      Component Value Date/Time   NA 136 04/13/2023 1007   K 3.3 (L) 04/13/2023 1007   CL 103 04/13/2023 1007   CO2 25 04/13/2023 1007   BUN 26 (H) 04/13/2023 1007    CREATININE 1.92 (H) 04/13/2023 1007      Component Value Date/Time   CALCIUM  8.6 (L) 04/13/2023 1007   ALKPHOS 58 04/13/2023 1007   AST 17 04/13/2023 1007   ALT 14 04/13/2023 1007   BILITOT 0.5 04/13/2023 1007       The 10-year ASCVD risk score (Arnett DK, et al., 2019) is: 42.4%  Assessment and Plan:   1. Diabetes, type 2: uncontrolled. A1c 10.1% (10/01/23), slightly improved from previous 10.6% (07/01/23). Reasonable goal <7.5-8% without hypoglycemia given significant comorbidity. Glycemic control is limited by evening meals including sugary beverages, will reassess glycemic after lifestyle changes (transitioning to no-sugar beverages)  Titrated Ozempic  to 2 mg weekly approximately 2-3 weeks ago.  Current regimen: Jardiance  25 mg/d, glipizide  XL 10 mg daily, Lantus  48 u/d, Ozempic  2 mg/wk ABG  171 mg/dL with one instance of hypoglycemia, which was re-corrected quickly.  Continue Lanuts 50 units daily.  Continue Ozempic  2 mg weekly Continue glipizide  10 mg daily with breakfast Continue Jardiance  25 mg daily Counseled on reducing sugary beverages with meals, especially dinners due to hyperglycemia.  Close pharm follow up ~2 weeks to assess insulin  dose and effects of lifestyle modifications  Future Consideration: Metformin : Avoid in setting of CKD TZD: Avoiding in the setting of CHF Dx Insulin : Continue to maximize non-insulin  therapies w goal of reducing insulin  dose given risk hypoglycemia. Follow Up  PCP follow up scheduled ~3 month (01/05/2024)  PharmD follow up by phone 2 weeks (10/21/23) to review basal insulin  needs  Patient given direct line for questions regarding medication therapy  Future Appointments  Date Time Provider Department Center  10/16/2023  1:30 PM [facility] [place]  10/21/2023 10:00 AM [facility] PHARMACIST [facility]  01/05/2024  8:20 AM Bennett Reuben POUR, MD [facility]  02/23/2024  9:45 AM [facility] None  03/01/2024 10:15 AM [name], MD [facility] None   Woodie Jock, PharmD PGY1 Pharmacy Resident   Saw patient in conjunction with resident, Dr. Jock Manuelita FABIENE Geronimo, PharmD Clinical Pharmacist [facility] [place] Ph: [phone]

## 2023-10-16 ENCOUNTER — Ambulatory Visit
Admission: RE | Admit: 2023-10-16 | Discharge: 2023-10-16 | Disposition: A | Discharge status: Home / Self Care | Source: Ambulatory Visit | Attending: Interventional Radiology | Admitting: Interventional Radiology

## 2023-10-16 DIAGNOSIS — C641 Malignant neoplasm of right kidney, except renal pelvis: Secondary | ICD-10-CM

## 2023-10-16 HISTORY — PX: IR RADIOLOGIST EVAL & MGMT: IMG5224
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
## 2023-10-21 ENCOUNTER — Other Ambulatory Visit (INDEPENDENT_AMBULATORY_CARE_PROVIDER_SITE_OTHER): Admitting: Pharmacist

## 2023-10-21 DIAGNOSIS — Z7984 Long term (current) use of oral hypoglycemic drugs: Secondary | ICD-10-CM

## 2023-10-21 DIAGNOSIS — E1142 Type 2 diabetes mellitus with diabetic polyneuropathy: Secondary | ICD-10-CM

## 2023-10-21 NOTE — Progress Notes (Signed)
 10/21/2023 Name: Adam [name] MRN: [id number] DOB: Jul 14, 1950  Subjective  Chief Complaint  Patient presents with   Diabetes   Reason for visit: ?  Medication titration and monitoring  Care Team: Primary Care Provider: [name], MD Cardiology - [name], MD   Nephrology  Since Last visit / History of Present Illness: ?  Since last visit, patient reports doing well. Denies missed doses of medication. No further use of oral steroids or any medication changes. Continues to receive Jardiance  and Ozempic  via PAP without issues. No recent changes to dietary patterns/activity level.    Reported DM Regimen: ?  Basaglar  50 units daily (night) Ozempic  2.0 mg weekly (increased 6/12) Jardiance  25 mg daily Glipizide  XL 10 mg each morning    SMBG Libre 3 plus (Reader):  Today: Time in target (Last 7 Days): Above 55%; In target 45%; Below 0% Average glucose (Last 7 Days): 187 mg/dL Average glucose by time of day:     12 am - 6 am: 168       6 am - 12 pm: 162      12 pm - 6 pm: 197      6 pm - 12 am: 224  Previous, 8/13: Average glucose Last - 7 Days:  171 mg/dL Average glucose by time of day:     12 am - 6 am: 134       6 am - 12 pm: 147      12 pm - 6 pm: 179      6 pm - 12 am: 231   Diet: Breakfast (10 am - glipizide  at 9-9:30 am): 2 eggs, sausage, small portion of grits Lunch: Skips Dinner (5-6 pm): Baked chicken or fish, pinto/black beans (one-twice weekly), cabbage, carrots, broccoli. Rice/potatoes. Less frequently pork chops/ground beef.  Snacks: Occasionally fruit (watermelon, grapes, orange), pig skins a couple days per week Beverages: Water (~64oz/day), tea (unsweet with Equal added), lemonade (made with Equal sweetener), cranberry juice, orange juice (1-2 glasses/week).    Hypo/Hyperglycemia: ?  Symptoms of hypoglycemia since last visit: Once This morning was 68 mg/dL. Ate cereal. Later on, 69 and had another bowl of cereal + orange juice. Now  is 194 Symptoms of hyperglycemia since last visit:? no   Assessment and Plan:   1. Diabetes, type 2: Today, sugars are increased from previous report despite consistent medication use without changes in dietary patterns. Basal insulin  titration limited by fasting at goal many days per patient report still with rare morning lows near 70 mg/dL (one in the past 2 weeks, high 60s mg/dL). PP hyperglycemia seems to be driving higher averages.  Rare instances of BG <70 mg/dL (lowest 67 mg/dL, fasting). Extensively reviewed diet today though based on report, reason for evening hyperglycemia, and higher averages overall not entirely clear. Has cut out almost all sugar-containing drinks. Reports adequate protein with all meals, especially dinner.  Current Regimen: Ozempic  2.0 mg/wk, basalgar 50u/d, Jardiance  25mg /d, glipizide  XL 10mg  am Reviewed optimal balance of macros (e.g. diabetes plate method).  Goal to focus on portion sizes of carbs relative to protein/vegetable.  Also work on portion size of fruit servings, consider addition of protein with fruit snack (low-fat string cheese, sugar-free yogurt, nuts, etc.)   Future Consideration: GLP1: Max dose. Receives via Patient assistance. Mounjaro not a feasible option per cost.  SGLT2i: Max dose  Insulin : Continue to maximize non-insulin  therapies w goal of reducing insulin  dose given risk hypoglycemia. Prandial no unreasonable, though previous  achieved decent glycemic control on current regimen. Preference to avoid increasing risk of hypoglycemia.  Metformin : Avoid in setting of CKD TZD: Avoiding in the setting of CHF Dx   Follow Up  Brief PharmD follow up by phone ~2 weeks. Patient given direct line for questions regarding medication therapy or if any c/f low glucose alarms on CGM.   Future Appointments  Date Time Provider Department Center  11/04/2023 10:00 AM [facility] PHARMACIST [facility] [address]  01/05/2024  8:20 AM Bennett Reuben POUR, MD [facility] [address]  02/23/2024  9:45 AM [facility] None  03/01/2024 10:15 AM [name], MD [facility] None   Manuelita FABIENE Kobs, PharmD Clinical Pharmacist [facility] [phone]

## 2023-10-21 NOTE — Patient Instructions (Addendum)
 Mr. Adam [name],   It was a pleasure to speak with you today! As we discussed:?   Continue all medications as you have been taking them Continue all of the good work with the dietary changes  Things to continue to work on the next 2 weeks: Balance meals with each food type (protein, vegetable, carb/starch) Ideally, half of the plate contains healthy vegetables, a quarter of your plate contains lean protein such a chicken/fish, and a quarter of your plate contains carb such as potato, rice, pasta.   Also take note of portion sizes of fruit servings. Consider addition of protein with your fruit snack (protein sources may include low-fat string cheese, sugar-free yogurt, nuts, cottage cheese, etc.)  One serving of fruit has 15 grams of carbs. But the serving size can be very different depending on the type of fruit. For example, you get 15 grams of carbs from: Small handful of grapes (1/2 cup of grapes) 1/2 medium apple or banana 1 cup blackberries or raspberries 3/4 cup blueberries 1 1/4 cup whole strawberries 1 cup cubed honeydew melon Half of an orange (or 2 mini clementines)  Future Appointments  Date Time Provider Department Center  11/04/2023 10:00 AM [facility] PHARMACIST [facility] [address]  01/05/2024  8:20 AM Bennett Reuben POUR, MD [facility] [address]  02/23/2024  9:45 AM [facility] None  03/01/2024 10:15 AM [name], MD [facility] None    Manuelita FABIENE Kobs, PharmD Clinical Pharmacist [facility] [phone]

## 2023-10-22 ENCOUNTER — Other Ambulatory Visit: Payer: Self-pay | Admitting: Internal Medicine

## 2023-10-27 ENCOUNTER — Telehealth: Payer: Self-pay

## 2023-10-27 NOTE — Telephone Encounter (Signed)
 Patient was identified as falling into the [place] Measure - Diabetes.   Patient was: Appointment already scheduled for:  01/05/24. Referred to pharmacy for chronic disease management.

## 2023-10-30 DIAGNOSIS — H40053 Ocular hypertension, bilateral: Secondary | ICD-10-CM | POA: Diagnosis not present

## 2023-10-30 DIAGNOSIS — H2000 Unspecified acute and subacute iridocyclitis: Secondary | ICD-10-CM | POA: Diagnosis not present

## 2023-10-30 DIAGNOSIS — H40043 Steroid responder, bilateral: Secondary | ICD-10-CM | POA: Diagnosis not present

## 2023-11-04 ENCOUNTER — Other Ambulatory Visit (INDEPENDENT_AMBULATORY_CARE_PROVIDER_SITE_OTHER): Admitting: Pharmacist

## 2023-11-04 DIAGNOSIS — E1142 Type 2 diabetes mellitus with diabetic polyneuropathy: Secondary | ICD-10-CM

## 2023-11-04 NOTE — Progress Notes (Unsigned)
 11/04/2023 Name: Adam [name] MRN: [id number] DOB: 23-Oct-1950  Subjective  Chief Complaint  Patient presents with   Diabetes   Reason for visit: ?  Medication titration and monitoring  Care Team: Primary Care Provider: [name], MD ? [facility] Dr. Deane 01/05/24 Cardiology - [name], MD   Nephrology  Since Last visit / History of Present Illness: ?  Since last visit, patient reports doing well. Denies missed doses of medication. Has continued to work on dietary changes with his wife over the past few weeks.    Reported DM Regimen: ?  Basaglar  50 units daily (night) Ozempic  2.0 mg weekly (increased 6/12) Jardiance  25 mg daily Glipizide  XL 10 mg each morning    SMBG Libre 3 plus (Reader):  Today: Time in target (Last 14 Days): Above 30%; In target 65%; Below 5% Average glucose (Last 7 Days): 149 mg/dL Average glucose by time of day:     12 am - 6 am: 134 (1 low, 69)       6 am - 12 pm: 134      12 pm - 6 pm: 159 (1 low)      6 pm - 12 am: 174  Previous 8/27 Time in target (Last 7 Days): Above 55%; In target 45%; Below 0% Average glucose (Last 7 Days): 187 mg/dL Average glucose by time of day:     12 am - 6 am: 168       6 am - 12 pm: 162      12 pm - 6 pm: 197      6 pm - 12 am: 224  Previous, 8/13: Average glucose Last - 7 Days:  171 mg/dL Average glucose by time of day:     12 am - 6 am: 134       6 am - 12 pm: 147      12 pm - 6 pm: 179      6 pm - 12 am: 231   Diet: Since last visit: Cut out all diet sodas. Drinking primarily water, unsweet tea, coffee. Started eating more fresh veggies, fresh fruits, baked chicken/fish.  Breakfast (10 am - glipizide  at 9-9:30 am): 2 eggs, sausage, small portion of grits Lunch: Skips Dinner (5-6 pm): Baked chicken or fish, pinto/black beans (one-twice weekly), cabbage, carrots, broccoli. Rice/potatoes. Less frequently pork chops/ground beef.  Snacks: Occasionally fruit (watermelon, grapes, orange), pig skins  a couple days per week Beverages: Water (~64oz/day), tea (unsweet with Equal added)   Hypo/Hyperglycemia: ?  Symptoms of hypoglycemia since last visit: Once One reading. 69 mg/dL. Ate cereal. Later on, 69 and had another bowl of cereal + orange juice. Now is 194 Symptoms of hyperglycemia since last visit:? no   Assessment and Plan:   1. Diabetes, type 2: Today, sugars are significantly improved from previous still with rare morning lows near 70 mg/dL (one in the past 2 weeks, 69). ABG 149, if sustained would correlate to A1c of 6.8%.  PP hyperglycemia much improved which patient attributes to ongoing dietary modification with focus on more fresh vegetables/fruits. Continue current medications. Patient to keep an eye on low sugars. If more than 1/week or any less that upper 60's will plan for slight basal decrease.  Current Regimen: Ozempic  2.0 mg/wk, basalgar 50u/d, Jardiance  25mg /d, glipizide  XL 10mg  am Continue current medications  Future Consideration: Glipizide : Consider dose reduction if any c/f mid-day lows.  GLP1: Max dose. Receives via Patient assistance. Mounjaro not a feasible  option per cost.  SGLT2i: Max dose  Insulin : Continue to maximize non-insulin  therapies w goal of reducing insulin  dose given risk hypoglycemia. Prandial no unreasonable, though previous achieved decent glycemic control on current regimen. Preference to avoid increasing risk of hypoglycemia.  Metformin : Avoid in setting of CKD TZD: Avoiding in the setting of CHF Dx   Follow Up  Brief PharmD follow up by phone 2-3 weeks. Patient given direct line for questions regarding medication therapy or if any c/f low glucose alarms on CGM.  TOC scheduled 2 months with Dr. Bennett  Future Appointments  Date Time Provider Department Center  01/05/2024  8:20 AM Bennett Reuben POUR, MD [facility] [address]  02/23/2024  9:45 AM [facility] None  03/01/2024 10:15 AM [name], MD [facility] None   Manuelita FABIENE Kobs,  PharmD Clinical Pharmacist [facility] [phone]

## 2023-11-09 ENCOUNTER — Other Ambulatory Visit: Payer: Self-pay | Admitting: Oncology

## 2023-11-11 ENCOUNTER — Encounter: Payer: Self-pay | Admitting: Pharmacist

## 2023-11-11 NOTE — Progress Notes (Signed)
 Patient Assistance Program (PAP) Application   Manufacturer: Nov[employer] Nordisk  - Refill request received from [employer] Medication(s): Ozempic  2.0 mg   Form completed by PharmD x1 refill.  In Letvak's absence, form provided to alternative provider Stacie)

## 2023-11-17 NOTE — Progress Notes (Signed)
 FREEDOM PEDDY                                          MRN: [id number]   11/17/2023   The [facility] Quality Team Specialist reviewed this patient medical record for the purposes of chart review for care gap closure. The following were reviewed: chart review for care gap closure-glycemic status assessment. A1c out of range 10.1.    [facility] Quality Team

## 2023-11-18 ENCOUNTER — Other Ambulatory Visit (INDEPENDENT_AMBULATORY_CARE_PROVIDER_SITE_OTHER): Admitting: Pharmacist

## 2023-11-18 DIAGNOSIS — E1142 Type 2 diabetes mellitus with diabetic polyneuropathy: Secondary | ICD-10-CM

## 2023-11-18 NOTE — Progress Notes (Signed)
 11/18/2023 Name: Adam [name] MRN: [id number] DOB: 05-08-1950  Subjective  Chief Complaint  Patient presents with   Diabetes   Reason for visit: ?  Medication titration and monitoring  Care Team: Primary Care Provider: [name], MD ? [facility] Dr. Deane 01/05/24 Provider in clinic today, Ginger Patrick  Cardiology - [name], MD   Nephrology  Since Last visit / History of Present Illness: ?  Since last visit, patient reports doing well. Denies missed doses of medication. Has continued to work on dietary changes with his wife over the past few weeks.    Reported DM Regimen: ?  Basaglar  50 units daily (night) Ozempic  2.0 mg weekly (increased 6/12) Jardiance  25 mg daily Glipizide  XL 10 mg each morning    SMBG Libre 3 plus (Reader):  Today: Time in target (Last 7 Days): Above 43%; In target 56%; Below 1% Average glucose (Last 7 Days): 171 mg/dL Average glucose by time of day:     12 am - 6 am: 158       6 am - 12 pm: 123 (1 low - notes lighter than usual dinner evening prior)      12 pm - 6 pm: 188      6 pm - 12 am: 222  Previous 9/10 Time in target (Last 14 Days): Above 30%; In target 65%; Below 5% Average glucose (Last 7 Days): 149 mg/dL Average glucose by time of day:     12 am - 6 am: 134 (1 low, 69)       6 am - 12 pm: 134      12 pm - 6 pm: 159 (1 low)      6 pm - 12 am: 174  Previous 8/27 Time in target (Last 7 Days): Above 55%; In target 45%; Below 0% Average glucose (Last 7 Days): 187 mg/dL Average glucose by time of day:     12 am - 6 am: 168       6 am - 12 pm: 162      12 pm - 6 pm: 197      6 pm - 12 am: 224  Previous, 8/13: Average glucose Last - 7 Days:  171 mg/dL Average glucose by time of day:     12 am - 6 am: 134       6 am - 12 pm: 147      12 pm - 6 pm: 179      6 pm - 12 am: 231   Diet: Since last visit: No changes Cut out all diet sodas. Drinking primarily water, unsweet tea, coffee. Started eating more fresh veggies,  fresh fruits, baked chicken/fish.  Breakfast (10 am - glipizide  at 9-9:30 am): 2 eggs, sausage, small portion of grits Lunch: Skips Dinner (5-6 pm): Baked chicken or fish, pinto/black beans (one-twice weekly), cabbage, carrots, broccoli. Rice/potatoes. Less frequently pork chops/ground beef.  Snacks: Occasionally fruit (watermelon, grapes, orange), pig skins a couple days per week Beverages: Water (~64oz/day), tea (unsweet with Equal added)   Hypo/Hyperglycemia: ?  Symptoms of hypoglycemia since last visit: Once Symptoms of hyperglycemia since last visit:? no   Assessment and Plan:   1. Diabetes, type 2: Today, sugars are slightly worsened from last visit, specifically surrounding meals. ABG increased from 149 to 171, if sustained would correlate to A1c of 7.6%. Has made great dietary changes over the past couple of months which have resulted in significant PPBG improvements. Encouraged ongoing maintenance of  these changes as to avoid sugars from trending up as they have over the past 1-2 weeks.  Current Regimen: Ozempic  2.0 mg/wk, basalgar 50u/d, Jardiance  25mg /d, glipizide  XL 10mg  am Continue current medications If having a light dinner, or low-carb dinner, incorporate small bedtime snack with carbs/proteins as to prevent sugar dips overnight.   Future Consideration: Glipizide : Consider dose reduction if any c/f mid-day lows.  GLP1: Max dose. Receives via Patient assistance. Mounjaro not a feasible option per cost.  SGLT2i: Max dose  Insulin : Continue to maximize non-insulin  therapies w goal of reducing insulin  dose given risk hypoglycemia. Prandial not unreasonable, though previous achieved decent glycemic control on current regimen. Preference to avoid increasing risk of hypoglycemia though small prandial dose may be needed (replace glipizide )  Metformin : Avoid in setting of CKD TZD: Avoiding in the setting of CHF Dx   Follow Up  Brief PharmD follow up by phone 3-4 weeks. Patient  given direct line for questions regarding medication therapy or if any c/f low glucose alarms on CGM.  TOC scheduled with Dr. Bennett Nov 11, due for repeat A1c  Future Appointments  Date Time Provider Department Center  01/05/2024  8:20 AM Bennett Reuben POUR, MD [facility] [address]  02/23/2024  9:45 AM [facility] None  03/01/2024 10:15 AM [name], MD [facility] None   Manuelita FABIENE Kobs, PharmD Clinical Pharmacist [facility] [phone]

## 2023-11-30 ENCOUNTER — Telehealth: Payer: Self-pay

## 2023-11-30 NOTE — Telephone Encounter (Signed)
 Called and spoke to pt's wife that we received 4 boxes of Tresiba. Placed in middle fridge in med area.

## 2023-12-01 NOTE — Telephone Encounter (Signed)
Pt came by and picked up form

## 2023-12-07 ENCOUNTER — Other Ambulatory Visit: Payer: Self-pay

## 2023-12-07 NOTE — Telephone Encounter (Signed)
 Patient arrived in office to pick up medication

## 2023-12-07 NOTE — Telephone Encounter (Signed)
 Received patient assistance medications for patient.  Ozempic  4boxes  Medications have been placed in the refrigerator and labeled with patient information

## 2023-12-09 ENCOUNTER — Other Ambulatory Visit: Admitting: Pharmacist

## 2023-12-09 NOTE — Progress Notes (Unsigned)
 12/09/2023 Name: Adam [name] MRN: [id number] DOB: 17-May-1950  Subjective  No chief complaint on file.  Reason for visit: ?  Medication titration and monitoring  Care Team: Primary Care Provider: [name], MD ? [facility] Dr. Deane 01/05/24 Provider in clinic today, Ginger Patrick  Cardiology - [name], MD   Nephrology  Since Last visit / History of Present Illness: ?  Since last visit, patient reports doing well. Denies missed doses of medication. Has continued to work on dietary changes with his wife over the past few weeks.    Reported DM Regimen: ?  Basaglar  50 units daily (night) Ozempic  2.0 mg weekly (increased 6/12) Jardiance  25 mg daily Glipizide  XL 10 mg each morning    SMBG Libre 3 plus (Reader):  Today (10/15) Time in target (Last 7 Days): Above 46%; In target 53%; Below 1% Average glucose (Last 7 Days): 177 mg/dL Average glucose by time of day:     12 am - 6 am: 127       6 am - 12 pm: 168      12 pm - 6 pm: 202      6 pm - 12 am: 221  Previous 9/24: Time in target (Last 7 Days): Above 43%; In target 56%; Below 1% Average glucose (Last 7 Days): 171 mg/dL Average glucose by time of day:     12 am - 6 am: 158       6 am - 12 pm: 123 (1 low - notes lighter than usual dinner evening prior)      12 pm - 6 pm: 188      6 pm - 12 am: 222  Previous 9/10 Time in target (Last 14 Days): Above 30%; In target 65%; Below 5% Average glucose (Last 7 Days): 149 mg/dL Average glucose by time of day:     12 am - 6 am: 134 (1 low, 69)       6 am - 12 pm: 134      12 pm - 6 pm: 159 (1 low)      6 pm - 12 am: 174  Previous 8/27 Time in target (Last 7 Days): Above 55%; In target 45%; Below 0% Average glucose (Last 7 Days): 187 mg/dL Average glucose by time of day:     12 am - 6 am: 168       6 am - 12 pm: 162      12 pm - 6 pm: 197      6 pm - 12 am: 224  Previous, 8/13: Average glucose Last - 7 Days:  171 mg/dL Average glucose by time of day:      12 am - 6 am: 134       6 am - 12 pm: 147      12 pm - 6 pm: 179      6 pm - 12 am: 231   Diet: Since last visit: No changes Cut out all diet sodas. Drinking primarily water, unsweet tea, coffee. Started eating more fresh veggies, fresh fruits, baked chicken/fish.  Breakfast (10 am - glipizide  at 9-9:30 am): 2 eggs, sausage, small portion of grits Lunch: Skips Dinner (5-6 pm): Baked chicken or fish, pinto/black beans (one-twice weekly), cabbage, carrots, broccoli. Rice/potatoes. Less frequently pork chops/ground beef.  Snacks: Occasionally fruit (watermelon, grapes, orange), pig skins a couple days per week Beverages: Water (~64oz/day), tea (unsweet with Equal added)   Hypo/Hyperglycemia: ?  Symptoms of hypoglycemia  since last visit: Once Symptoms of hyperglycemia since last visit:? no   Assessment and Plan:   1. Diabetes, type 2: Today, sugars are slightly worsened from last visit, specifically surrounding meals. ABG increased from 149 to 171, if sustained would correlate to A1c of 7.6%. Has made great dietary changes over the past couple of months which have resulted in significant PPBG improvements. Encouraged ongoing maintenance of these changes as to avoid sugars from trending up as they have over the past 1-2 weeks.  Current Regimen: Ozempic  2.0 mg/wk, basalgar 50u/d, Jardiance  25mg /d, glipizide  XL 10mg  am Continue current medications If having a light dinner, or low-carb dinner, incorporate small bedtime snack with carbs/proteins as to prevent sugar dips overnight.   Future Consideration: Glipizide : Consider dose reduction if any c/f mid-day lows.  GLP1: Max dose. Receives via Patient assistance. Mounjaro not a feasible option per cost.  SGLT2i: Max dose  Insulin : Continue to maximize non-insulin  therapies w goal of reducing insulin  dose given risk hypoglycemia. Prandial not unreasonable, though previous achieved decent glycemic control on current regimen. Preference to avoid  increasing risk of hypoglycemia though small prandial dose may be needed (replace glipizide )  Metformin : Avoid in setting of CKD TZD: Avoiding in the setting of CHF Dx   Follow Up  Brief PharmD follow up by phone 3-4 weeks. Patient given direct line for questions regarding medication therapy or if any c/f low glucose alarms on CGM.  TOC scheduled with Dr. Bennett Nov 11, due for repeat A1c  Future Appointments  Date Time Provider Department Center  12/09/2023 10:30 AM [facility] PHARMACIST [facility] [address]  01/05/2024  8:20 AM Bennett Reuben POUR, MD [facility] [address]  02/23/2024  9:45 AM [facility] None  03/01/2024 10:15 AM [name], MD [facility] None   Manuelita FABIENE Kobs, PharmD Clinical Pharmacist [facility] [phone]

## 2023-12-21 ENCOUNTER — Telehealth: Payer: Self-pay

## 2023-12-21 NOTE — Telephone Encounter (Signed)
 Filled and mailed pt portion today, will fax provider portion. For lillycares basaglar  and bicares jardiance 

## 2023-12-25 NOTE — Telephone Encounter (Signed)
 [name](Basaglar ) and [name](Jardiance ) re-fax to a provider at [facility], pt no longer seeing Dr, Jimmy.

## 2023-12-29 NOTE — Telephone Encounter (Signed)
 Hi team, can you send us  another copy? Error made on original copy.

## 2023-12-30 ENCOUNTER — Other Ambulatory Visit (HOSPITAL_COMMUNITY): Payer: Self-pay

## 2023-12-30 NOTE — Telephone Encounter (Signed)
 Thanks! Can we place in Dr. Charlyn inbox as patient will be establishing next week.

## 2023-12-30 NOTE — Telephone Encounter (Signed)
 Received [employer] pt portion and proof of income today.

## 2023-12-30 NOTE — Telephone Encounter (Signed)
 I will be on the look out for it and hold it for his appt with Dr Bennett.

## 2024-01-05 ENCOUNTER — Ambulatory Visit (INDEPENDENT_AMBULATORY_CARE_PROVIDER_SITE_OTHER)

## 2024-01-05 DIAGNOSIS — Z7985 Long-term (current) use of injectable non-insulin antidiabetic drugs: Secondary | ICD-10-CM

## 2024-01-05 DIAGNOSIS — E119 Type 2 diabetes mellitus without complications: Secondary | ICD-10-CM

## 2024-01-05 DIAGNOSIS — N1832 Chronic kidney disease, stage 3b: Secondary | ICD-10-CM

## 2024-01-05 LAB — POCT GLYCOSYLATED HEMOGLOBIN (HGB A1C): Hemoglobin A1C: 8 % — AB (ref 4.0–5.6)

## 2024-01-05 NOTE — Patient Instructions (Addendum)
 Thank you for visiting [facility] today! Here's what we talked about: - START eating plain whole wheat bread, mash potatoes, reduce coleslaw and baked beans - STOP Glipizide , continue Insulin  at night 50 units, send on Mychart fasting morning sugars after 4 days. - Call diabetic nutrition in 2 weeks if you haven't heard from them  - Get COVID, shingles and tetanus vaccines from pharmacy
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
## 2024-01-06 ENCOUNTER — Ambulatory Visit: Payer: Self-pay

## 2024-01-08 ENCOUNTER — Telehealth: Payer: Self-pay

## 2024-01-08 NOTE — Telephone Encounter (Signed)
 Copied from CRM #[id number]. Topic: General - Other >> Jan 08, 2024 11:47 AM Suzen RAMAN wrote: Reason for CRM: Patient called to provide Blood sugar readings per Shannon/providers  request.   11/11-Tuesday morning 94 11/12-Wednesday morning 99 11/13-Thursday  morning 149 11/14- Friday morning 89

## 2024-01-11 NOTE — Telephone Encounter (Signed)
 Faxing provider portion [employer] and [phone].

## 2024-02-01 NOTE — Progress Notes (Deleted)
  Start: *** end: ***  Patient is here today ***. Patient would like to learn ***. Patient lives with ***.  *** shopping and cooking. Pt reports eating out *** times weekly.  Pt reports making the following changes including ***.  All Pt's questions were answered during this encounter.    History includes:  *** Medications include:  *** Labs noted:  ***  8%, jardiance , basaglar , ozempic

## 2024-02-02 NOTE — Telephone Encounter (Signed)
 Received PAP [facility]/Basaglar ) back from provider office , [facility].

## 2024-02-02 NOTE — Telephone Encounter (Signed)
 Received pap [name] (Jardiance ) from provider office faxed to [facility] along pt portion proof of income and Ins.

## 2024-02-08 ENCOUNTER — Other Ambulatory Visit: Payer: Self-pay

## 2024-02-08 ENCOUNTER — Encounter: Admitting: Dietician

## 2024-02-08 DIAGNOSIS — E119 Type 2 diabetes mellitus without complications: Secondary | ICD-10-CM

## 2024-02-08 NOTE — Telephone Encounter (Unsigned)
 Copied from CRM #[id number]. Topic: Clinical - Medication Refill >> Feb 08, 2024 11:55 AM [name] wrote: Medication: Continuous Glucose Receiver (FREESTYLE LIBRE 3 READER) DEVI  Has the patient contacted their pharmacy? Yes (Agent: If no, request that the patient contact the pharmacy for the refill. If patient does not wish to contact the pharmacy document the reason why and proceed with request.) (Agent: If yes, when and what did the pharmacy advise?)  This is the patient's preferred pharmacy:  [facility] - [place], [place] - [name] [address] Phone: [phone] Fax: [phone]   Is this the correct pharmacy for this prescription? Yes If no, delete pharmacy and type the correct one.   Has the prescription been filled recently? No  Is the patient out of the medication? Yes  Has the patient been seen for an appointment in the last year OR does the patient have an upcoming appointment? Yes  Can we respond through MyChart? Yes  Agent: Please be advised that Rx refills may take up to 3 business days. We ask that you follow-up with your pharmacy.

## 2024-02-08 NOTE — Telephone Encounter (Signed)
 Received approval letter from [facility] Basaglar  thru 02/23/2025,approval letter index left a HIPAA VM.

## 2024-02-11 MED ORDER — FREESTYLE LIBRE 3 READER DEVI: 5 refills | Status: AC

## 2024-02-22 ENCOUNTER — Inpatient Hospital Stay: Attending: Oncology

## 2024-02-22 ENCOUNTER — Other Ambulatory Visit: Payer: Self-pay

## 2024-02-22 ENCOUNTER — Telehealth: Payer: Self-pay | Admitting: Pharmacist

## 2024-02-22 DIAGNOSIS — Z79899 Other long term (current) drug therapy: Secondary | ICD-10-CM | POA: Diagnosis not present

## 2024-02-22 DIAGNOSIS — I82401 Acute embolism and thrombosis of unspecified deep veins of right lower extremity: Secondary | ICD-10-CM | POA: Insufficient documentation

## 2024-02-22 DIAGNOSIS — N1832 Chronic kidney disease, stage 3b: Secondary | ICD-10-CM | POA: Insufficient documentation

## 2024-02-22 DIAGNOSIS — Z7984 Long term (current) use of oral hypoglycemic drugs: Secondary | ICD-10-CM

## 2024-02-22 LAB — CBC WITH DIFFERENTIAL (CANCER CENTER ONLY)
Abs Immature Granulocytes: 0.01 K/uL (ref 0.00–0.07)
Basophils Absolute: 0.1 K/uL (ref 0.0–0.1)
Basophils Relative: 1 %
Eosinophils Absolute: 0.3 K/uL (ref 0.0–0.5)
Eosinophils Relative: 4 %
HCT: 34.1 % — ABNORMAL LOW (ref 39.0–52.0)
Hemoglobin: 11.1 g/dL — ABNORMAL LOW (ref 13.0–17.0)
Immature Granulocytes: 0 %
Lymphocytes Relative: 54 %
Lymphs Abs: 4 K/uL (ref 0.7–4.0)
MCH: 29 pg (ref 26.0–34.0)
MCHC: 32.6 g/dL (ref 30.0–36.0)
MCV: 89 fL (ref 80.0–100.0)
Monocytes Absolute: 0.7 K/uL (ref 0.1–1.0)
Monocytes Relative: 9 %
Neutro Abs: 2.3 K/uL (ref 1.7–7.7)
Neutrophils Relative %: 32 %
Platelet Count: 180 K/uL (ref 150–400)
RBC: 3.83 MIL/uL — ABNORMAL LOW (ref 4.22–5.81)
RDW: 15.6 % — ABNORMAL HIGH (ref 11.5–15.5)
WBC Count: 7.2 K/uL (ref 4.0–10.5)
nRBC: 0 % (ref 0.0–0.2)

## 2024-02-22 LAB — CMP (CANCER CENTER ONLY)
ALT: 22 U/L (ref 0–44)
AST: 26 U/L (ref 15–41)
Albumin: 3.7 g/dL (ref 3.5–5.0)
Alkaline Phosphatase: 73 U/L (ref 38–126)
Anion gap: 9 (ref 5–15)
BUN: 29 mg/dL — ABNORMAL HIGH (ref 8–23)
CO2: 26 mmol/L (ref 22–32)
Calcium: 9.3 mg/dL (ref 8.9–10.3)
Chloride: 105 mmol/L (ref 98–111)
Creatinine: 2.64 mg/dL — ABNORMAL HIGH (ref 0.61–1.24)
GFR, Estimated: 24 mL/min — ABNORMAL LOW
Glucose, Bld: 118 mg/dL — ABNORMAL HIGH (ref 70–99)
Potassium: 3.8 mmol/L (ref 3.5–5.1)
Sodium: 140 mmol/L (ref 135–145)
Total Bilirubin: 0.3 mg/dL (ref 0.0–1.2)
Total Protein: 8.1 g/dL (ref 6.5–8.1)

## 2024-02-22 LAB — IRON AND TIBC
Iron: 59 ug/dL (ref 45–182)
Saturation Ratios: 26 % (ref 17.9–39.5)
TIBC: 224 ug/dL — ABNORMAL LOW (ref 250–450)
UIBC: 165 ug/dL

## 2024-02-22 LAB — FERRITIN: Ferritin: 86 ng/mL (ref 24–336)

## 2024-02-22 LAB — LACTATE DEHYDROGENASE: LDH: 200 U/L (ref 105–235)

## 2024-02-22 NOTE — Progress Notes (Signed)
 Brief Telephone Documentation Reason for Call: Patient's wife left message regarding question for pharmacist regarding CGM refill  Summary of Call: Mrs Oommen states patient has placed his last [place] sensor. Is in need of refills (last refill was for the reader).   Rx refill has been pended to PCP by CMA. Pending signature.  Reviewed pended refill is correct. Libre 3+ sensor > [facility] mail order pharmacy   Follow Up: Patient given direct line for further questions/concerns. Advised patient to call if they have not heard from pharmacy by middle-end of week.   Manuelita FABIENE Kobs, PharmD Clinical Pharmacist [facility] [phone]

## 2024-02-22 NOTE — Telephone Encounter (Unsigned)
 Copied from CRM #[id number]. Topic: Clinical - Medication Refill >> Feb 22, 2024 12:30 PM Alexandria E wrote: Medication: Continuous Glucose Sensor (FREESTYLE LIBRE 3 PLUS SENSOR) MISC  Has the patient contacted their pharmacy? Yes (Agent: If no, request that the patient contact the pharmacy for the refill. If patient does not wish to contact the pharmacy document the reason why and proceed with request.) (Agent: If yes, when and what did the pharmacy advise?)  This is the patient's preferred pharmacy:  [facility] - [place], [place] - [address] [place] [address] Phone: [phone] Fax: [phone]  Is this the correct pharmacy for this prescription? Yes If no, delete pharmacy and type the correct one.   Has the prescription been filled recently? No  Is the patient out of the medication? Yes  Has the patient been seen for an appointment in the last year OR does the patient have an upcoming appointment? Yes  Can we respond through MyChart? Yes  Agent: Please be advised that Rx refills may take up to 3 business days. We ask that you follow-up with your pharmacy.

## 2024-02-23 ENCOUNTER — Other Ambulatory Visit

## 2024-02-24 MED ORDER — ALLOPURINOL 100 MG PO TABS: 200.0000 mg | ORAL_TABLET | Freq: Every day | ORAL | 3 refills | Status: AC

## 2024-02-24 MED ORDER — OZEMPIC (2 MG/DOSE) 8 MG/3ML ~~LOC~~ SOPN: 2.0000 mg | PEN_INJECTOR | SUBCUTANEOUS | 0 refills | Status: AC

## 2024-02-24 MED ORDER — FREESTYLE LIBRE 3 PLUS SENSOR MISC: 3 refills | Status: AC

## 2024-03-01 ENCOUNTER — Encounter: Payer: Self-pay | Admitting: Oncology

## 2024-03-01 ENCOUNTER — Inpatient Hospital Stay: Attending: Oncology | Admitting: Oncology

## 2024-03-01 VITALS — BP 136/92 | HR 84 | Temp 98.3°F | Resp 20 | Wt 247.0 lb

## 2024-03-01 DIAGNOSIS — I82412 Acute embolism and thrombosis of left femoral vein: Secondary | ICD-10-CM | POA: Insufficient documentation

## 2024-03-01 DIAGNOSIS — Z881 Allergy status to other antibiotic agents status: Secondary | ICD-10-CM | POA: Insufficient documentation

## 2024-03-01 DIAGNOSIS — K862 Cyst of pancreas: Secondary | ICD-10-CM | POA: Diagnosis not present

## 2024-03-01 DIAGNOSIS — N281 Cyst of kidney, acquired: Secondary | ICD-10-CM | POA: Diagnosis not present

## 2024-03-01 DIAGNOSIS — I959 Hypotension, unspecified: Secondary | ICD-10-CM | POA: Insufficient documentation

## 2024-03-01 DIAGNOSIS — I82401 Acute embolism and thrombosis of unspecified deep veins of right lower extremity: Secondary | ICD-10-CM

## 2024-03-01 DIAGNOSIS — Z860100 Personal history of colon polyps, unspecified: Secondary | ICD-10-CM | POA: Diagnosis not present

## 2024-03-01 DIAGNOSIS — Z9841 Cataract extraction status, right eye: Secondary | ICD-10-CM | POA: Insufficient documentation

## 2024-03-01 DIAGNOSIS — R600 Localized edema: Secondary | ICD-10-CM | POA: Diagnosis not present

## 2024-03-01 DIAGNOSIS — M109 Gout, unspecified: Secondary | ICD-10-CM | POA: Insufficient documentation

## 2024-03-01 DIAGNOSIS — Z833 Family history of diabetes mellitus: Secondary | ICD-10-CM | POA: Insufficient documentation

## 2024-03-01 DIAGNOSIS — N1832 Chronic kidney disease, stage 3b: Secondary | ICD-10-CM | POA: Diagnosis not present

## 2024-03-01 DIAGNOSIS — Z886 Allergy status to analgesic agent status: Secondary | ICD-10-CM | POA: Insufficient documentation

## 2024-03-01 DIAGNOSIS — N184 Chronic kidney disease, stage 4 (severe): Secondary | ICD-10-CM | POA: Insufficient documentation

## 2024-03-01 DIAGNOSIS — C641 Malignant neoplasm of right kidney, except renal pelvis: Secondary | ICD-10-CM | POA: Insufficient documentation

## 2024-03-01 DIAGNOSIS — E1122 Type 2 diabetes mellitus with diabetic chronic kidney disease: Secondary | ICD-10-CM | POA: Insufficient documentation

## 2024-03-01 DIAGNOSIS — K5730 Diverticulosis of large intestine without perforation or abscess without bleeding: Secondary | ICD-10-CM | POA: Diagnosis not present

## 2024-03-01 DIAGNOSIS — Z7901 Long term (current) use of anticoagulants: Secondary | ICD-10-CM | POA: Insufficient documentation

## 2024-03-01 DIAGNOSIS — Z59868 Other specified financial insecurity: Secondary | ICD-10-CM | POA: Insufficient documentation

## 2024-03-01 DIAGNOSIS — Z818 Family history of other mental and behavioral disorders: Secondary | ICD-10-CM | POA: Insufficient documentation

## 2024-03-01 DIAGNOSIS — Z9842 Cataract extraction status, left eye: Secondary | ICD-10-CM | POA: Insufficient documentation

## 2024-03-01 DIAGNOSIS — N200 Calculus of kidney: Secondary | ICD-10-CM | POA: Diagnosis not present

## 2024-03-01 DIAGNOSIS — I129 Hypertensive chronic kidney disease with stage 1 through stage 4 chronic kidney disease, or unspecified chronic kidney disease: Secondary | ICD-10-CM | POA: Insufficient documentation

## 2024-03-01 DIAGNOSIS — Z79899 Other long term (current) drug therapy: Secondary | ICD-10-CM | POA: Insufficient documentation

## 2024-03-01 DIAGNOSIS — K900 Celiac disease: Secondary | ICD-10-CM | POA: Diagnosis not present

## 2024-03-01 DIAGNOSIS — D631 Anemia in chronic kidney disease: Secondary | ICD-10-CM | POA: Diagnosis not present

## 2024-03-01 DIAGNOSIS — I82452 Acute embolism and thrombosis of left peroneal vein: Secondary | ICD-10-CM | POA: Insufficient documentation

## 2024-03-01 DIAGNOSIS — Z87891 Personal history of nicotine dependence: Secondary | ICD-10-CM | POA: Diagnosis not present

## 2024-03-01 DIAGNOSIS — Z86718 Personal history of other venous thrombosis and embolism: Secondary | ICD-10-CM | POA: Insufficient documentation

## 2024-03-01 DIAGNOSIS — Z8379 Family history of other diseases of the digestive system: Secondary | ICD-10-CM | POA: Insufficient documentation

## 2024-03-01 DIAGNOSIS — Z8249 Family history of ischemic heart disease and other diseases of the circulatory system: Secondary | ICD-10-CM | POA: Insufficient documentation

## 2024-03-01 DIAGNOSIS — I82432 Acute embolism and thrombosis of left popliteal vein: Secondary | ICD-10-CM | POA: Insufficient documentation

## 2024-03-01 NOTE — Assessment & Plan Note (Addendum)
 Worse Cr. Recommend pt to follow up with nephrology Avoid nephrotoxins and encourage oral hydration.

## 2024-03-01 NOTE — Assessment & Plan Note (Addendum)
 Lab Results  Component Value Date   HGB 11.1 (L) 02/22/2024   TIBC 224 (L) 02/22/2024   IRONPCTSAT 26 02/22/2024   FERRITIN 86 02/22/2024    Hemoglobin has improved Continue ferrous sulfate  325 mg one daily

## 2024-03-01 NOTE — Assessment & Plan Note (Signed)
#  unprovoked acute lower extremity DVT.  Negative prothrombin gene and factor V Leiden mutation, negative APS antibodies..Repeat left lower extremity DVT.  Continue Eliquis 2.5mg  BID for long term prophylaxis.   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

## 2024-03-01 NOTE — Assessment & Plan Note (Signed)
 CA 19.9 normal. 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  S/p  EUS evaluation, no biopsy was done due to small size.   Surveillance with MRI abdomen MRCP

## 2024-03-01 NOTE — Assessment & Plan Note (Addendum)
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  Continue annual surveillance.- Feb/March 2026

## 2024-03-01 NOTE — Telephone Encounter (Signed)
 Received approval letter on [name] (Jardiance ) thru 02/23/2025,approval letter index.
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
## 2024-03-21 ENCOUNTER — Other Ambulatory Visit: Payer: Self-pay | Admitting: Oncology

## 2024-04-01 LAB — OPHTHALMOLOGY REPORT-SCANNED

## 2024-04-06 ENCOUNTER — Encounter

## 2024-04-07 ENCOUNTER — Ambulatory Visit

## 2024-04-08 ENCOUNTER — Ambulatory Visit

## 2024-04-13 ENCOUNTER — Ambulatory Visit: Admitting: Cardiovascular Disease

## 2024-04-18 ENCOUNTER — Ambulatory Visit

## 2024-04-19 ENCOUNTER — Ambulatory Visit

## 2024-07-21 ENCOUNTER — Ambulatory Visit

## 2024-08-30 ENCOUNTER — Inpatient Hospital Stay

## 2024-09-06 ENCOUNTER — Inpatient Hospital Stay: Admitting: Oncology
